# Patient Record
Sex: Male | Born: 1940 | Race: White | Hispanic: No | Marital: Married | Smoking: Former smoker
Health system: Southern US, Community
[De-identification: ages and names within clinical notes are randomized; demographics above are authoritative.]

## PROBLEM LIST (undated history)

## (undated) DIAGNOSIS — I10 Essential (primary) hypertension: Secondary | ICD-10-CM

## (undated) DIAGNOSIS — J449 Chronic obstructive pulmonary disease, unspecified: Secondary | ICD-10-CM

## (undated) DIAGNOSIS — L409 Psoriasis, unspecified: Secondary | ICD-10-CM

## (undated) DIAGNOSIS — C801 Malignant (primary) neoplasm, unspecified: Secondary | ICD-10-CM

## (undated) DIAGNOSIS — R768 Other specified abnormal immunological findings in serum: Secondary | ICD-10-CM

## (undated) DIAGNOSIS — E785 Hyperlipidemia, unspecified: Secondary | ICD-10-CM

## (undated) DIAGNOSIS — D649 Anemia, unspecified: Secondary | ICD-10-CM

## (undated) DIAGNOSIS — H269 Unspecified cataract: Secondary | ICD-10-CM

## (undated) DIAGNOSIS — Z974 Presence of external hearing-aid: Secondary | ICD-10-CM

## (undated) HISTORY — DX: Hyperlipidemia, unspecified: E78.5

## (undated) HISTORY — DX: Essential (primary) hypertension: I10

## (undated) HISTORY — DX: Unspecified cataract: H26.9

## (undated) HISTORY — DX: Malignant (primary) neoplasm, unspecified: C80.1

## (undated) HISTORY — PX: COLONOSCOPY: SHX174

## (undated) HISTORY — PX: NO PAST SURGERIES: SHX2092

## (undated) HISTORY — DX: Other specified abnormal immunological findings in serum: R76.8

## (undated) HISTORY — DX: Anemia, unspecified: D64.9

## (undated) HISTORY — DX: Psoriasis, unspecified: L40.9

---

## 2007-03-23 ENCOUNTER — Ambulatory Visit: Payer: Self-pay | Admitting: Unknown Physician Specialty

## 2007-05-11 ENCOUNTER — Encounter: Admission: RE | Admit: 2007-05-11 | Discharge: 2007-05-11 | Payer: Self-pay | Admitting: Unknown Physician Specialty

## 2007-06-04 ENCOUNTER — Ambulatory Visit: Payer: Self-pay | Admitting: Unknown Physician Specialty

## 2007-06-11 ENCOUNTER — Ambulatory Visit: Payer: Self-pay | Admitting: Unknown Physician Specialty

## 2007-11-11 ENCOUNTER — Ambulatory Visit: Payer: Self-pay | Admitting: Unknown Physician Specialty

## 2010-03-23 DIAGNOSIS — Z720 Tobacco use: Secondary | ICD-10-CM | POA: Insufficient documentation

## 2010-03-23 DIAGNOSIS — Z8249 Family history of ischemic heart disease and other diseases of the circulatory system: Secondary | ICD-10-CM | POA: Insufficient documentation

## 2010-03-23 DIAGNOSIS — E7849 Other hyperlipidemia: Secondary | ICD-10-CM | POA: Insufficient documentation

## 2010-07-04 ENCOUNTER — Ambulatory Visit: Payer: Self-pay | Admitting: Family Medicine

## 2010-07-16 ENCOUNTER — Ambulatory Visit: Payer: Self-pay | Admitting: Unknown Physician Specialty

## 2010-09-10 ENCOUNTER — Ambulatory Visit: Payer: Self-pay | Admitting: Unknown Physician Specialty

## 2011-06-06 ENCOUNTER — Ambulatory Visit: Payer: Self-pay | Admitting: Orthopedic Surgery

## 2011-08-14 DIAGNOSIS — L408 Other psoriasis: Secondary | ICD-10-CM | POA: Diagnosis not present

## 2011-08-14 DIAGNOSIS — D485 Neoplasm of uncertain behavior of skin: Secondary | ICD-10-CM | POA: Diagnosis not present

## 2011-08-14 DIAGNOSIS — L821 Other seborrheic keratosis: Secondary | ICD-10-CM | POA: Diagnosis not present

## 2011-10-28 DIAGNOSIS — I10 Essential (primary) hypertension: Secondary | ICD-10-CM | POA: Diagnosis not present

## 2011-10-28 DIAGNOSIS — J449 Chronic obstructive pulmonary disease, unspecified: Secondary | ICD-10-CM | POA: Diagnosis not present

## 2011-10-28 DIAGNOSIS — F17200 Nicotine dependence, unspecified, uncomplicated: Secondary | ICD-10-CM | POA: Diagnosis not present

## 2011-10-28 DIAGNOSIS — E7800 Pure hypercholesterolemia, unspecified: Secondary | ICD-10-CM | POA: Diagnosis not present

## 2011-11-05 DIAGNOSIS — E7800 Pure hypercholesterolemia, unspecified: Secondary | ICD-10-CM | POA: Diagnosis not present

## 2011-11-05 DIAGNOSIS — Z23 Encounter for immunization: Secondary | ICD-10-CM | POA: Diagnosis not present

## 2011-11-05 DIAGNOSIS — I10 Essential (primary) hypertension: Secondary | ICD-10-CM | POA: Diagnosis not present

## 2011-11-05 DIAGNOSIS — J449 Chronic obstructive pulmonary disease, unspecified: Secondary | ICD-10-CM | POA: Diagnosis not present

## 2011-12-17 DIAGNOSIS — D485 Neoplasm of uncertain behavior of skin: Secondary | ICD-10-CM | POA: Diagnosis not present

## 2011-12-17 DIAGNOSIS — L821 Other seborrheic keratosis: Secondary | ICD-10-CM | POA: Diagnosis not present

## 2011-12-26 DIAGNOSIS — H2510 Age-related nuclear cataract, unspecified eye: Secondary | ICD-10-CM | POA: Diagnosis not present

## 2012-02-11 DIAGNOSIS — D0430 Carcinoma in situ of skin of unspecified part of face: Secondary | ICD-10-CM | POA: Diagnosis not present

## 2012-02-11 DIAGNOSIS — L821 Other seborrheic keratosis: Secondary | ICD-10-CM | POA: Diagnosis not present

## 2012-02-11 DIAGNOSIS — D0439 Carcinoma in situ of skin of other parts of face: Secondary | ICD-10-CM | POA: Diagnosis not present

## 2012-03-05 ENCOUNTER — Ambulatory Visit: Payer: Self-pay | Admitting: Family Medicine

## 2012-03-05 DIAGNOSIS — R0902 Hypoxemia: Secondary | ICD-10-CM | POA: Diagnosis not present

## 2012-03-05 DIAGNOSIS — J449 Chronic obstructive pulmonary disease, unspecified: Secondary | ICD-10-CM | POA: Diagnosis not present

## 2012-03-05 DIAGNOSIS — E7800 Pure hypercholesterolemia, unspecified: Secondary | ICD-10-CM | POA: Diagnosis not present

## 2012-03-06 ENCOUNTER — Inpatient Hospital Stay: Payer: Self-pay | Admitting: Internal Medicine

## 2012-03-06 DIAGNOSIS — Z8249 Family history of ischemic heart disease and other diseases of the circulatory system: Secondary | ICD-10-CM | POA: Diagnosis not present

## 2012-03-06 DIAGNOSIS — E872 Acidosis, unspecified: Secondary | ICD-10-CM | POA: Diagnosis not present

## 2012-03-06 DIAGNOSIS — J9600 Acute respiratory failure, unspecified whether with hypoxia or hypercapnia: Secondary | ICD-10-CM | POA: Diagnosis not present

## 2012-03-06 DIAGNOSIS — F17200 Nicotine dependence, unspecified, uncomplicated: Secondary | ICD-10-CM | POA: Diagnosis not present

## 2012-03-06 DIAGNOSIS — Z79899 Other long term (current) drug therapy: Secondary | ICD-10-CM | POA: Diagnosis not present

## 2012-03-06 DIAGNOSIS — Z0389 Encounter for observation for other suspected diseases and conditions ruled out: Secondary | ICD-10-CM | POA: Diagnosis not present

## 2012-03-06 DIAGNOSIS — R0602 Shortness of breath: Secondary | ICD-10-CM | POA: Diagnosis not present

## 2012-03-06 DIAGNOSIS — J9620 Acute and chronic respiratory failure, unspecified whether with hypoxia or hypercapnia: Secondary | ICD-10-CM | POA: Diagnosis present

## 2012-03-06 DIAGNOSIS — J441 Chronic obstructive pulmonary disease with (acute) exacerbation: Secondary | ICD-10-CM | POA: Diagnosis not present

## 2012-03-06 DIAGNOSIS — M6282 Rhabdomyolysis: Secondary | ICD-10-CM | POA: Diagnosis present

## 2012-03-06 DIAGNOSIS — D72829 Elevated white blood cell count, unspecified: Secondary | ICD-10-CM | POA: Diagnosis present

## 2012-03-06 DIAGNOSIS — F411 Generalized anxiety disorder: Secondary | ICD-10-CM | POA: Diagnosis not present

## 2012-03-06 DIAGNOSIS — R799 Abnormal finding of blood chemistry, unspecified: Secondary | ICD-10-CM | POA: Diagnosis present

## 2012-03-06 DIAGNOSIS — R6889 Other general symptoms and signs: Secondary | ICD-10-CM | POA: Diagnosis not present

## 2012-03-06 DIAGNOSIS — J440 Chronic obstructive pulmonary disease with acute lower respiratory infection: Secondary | ICD-10-CM | POA: Diagnosis present

## 2012-03-06 DIAGNOSIS — E785 Hyperlipidemia, unspecified: Secondary | ICD-10-CM | POA: Diagnosis present

## 2012-03-06 LAB — CK TOTAL AND CKMB (NOT AT ARMC)
CK, Total: 449 U/L — ABNORMAL HIGH (ref 35–232)
CK-MB: 13.5 ng/mL — ABNORMAL HIGH (ref 0.5–3.6)

## 2012-03-06 LAB — CBC
MCV: 93 fL (ref 80–100)
Platelet: 275 10*3/uL (ref 150–440)
RBC: 5.19 10*6/uL (ref 4.40–5.90)
RDW: 14.3 % (ref 11.5–14.5)
WBC: 11.4 10*3/uL — ABNORMAL HIGH (ref 3.8–10.6)

## 2012-03-06 LAB — COMPREHENSIVE METABOLIC PANEL
Albumin: 3.8 g/dL (ref 3.4–5.0)
Alkaline Phosphatase: 104 U/L (ref 50–136)
BUN: 16 mg/dL (ref 7–18)
Calcium, Total: 9.7 mg/dL (ref 8.5–10.1)
Creatinine: 0.96 mg/dL (ref 0.60–1.30)
Glucose: 156 mg/dL — ABNORMAL HIGH (ref 65–99)
Osmolality: 280 (ref 275–301)
Sodium: 138 mmol/L (ref 136–145)
Total Protein: 8.4 g/dL — ABNORMAL HIGH (ref 6.4–8.2)

## 2012-03-06 LAB — PRO B NATRIURETIC PEPTIDE: B-Type Natriuretic Peptide: 729 pg/mL — ABNORMAL HIGH (ref 0–125)

## 2012-03-07 DIAGNOSIS — E785 Hyperlipidemia, unspecified: Secondary | ICD-10-CM | POA: Diagnosis not present

## 2012-03-07 DIAGNOSIS — Z4682 Encounter for fitting and adjustment of non-vascular catheter: Secondary | ICD-10-CM | POA: Diagnosis not present

## 2012-03-07 DIAGNOSIS — J9600 Acute respiratory failure, unspecified whether with hypoxia or hypercapnia: Secondary | ICD-10-CM | POA: Diagnosis not present

## 2012-03-07 DIAGNOSIS — R1312 Dysphagia, oropharyngeal phase: Secondary | ICD-10-CM | POA: Diagnosis not present

## 2012-03-07 DIAGNOSIS — R748 Abnormal levels of other serum enzymes: Secondary | ICD-10-CM | POA: Diagnosis present

## 2012-03-07 DIAGNOSIS — J441 Chronic obstructive pulmonary disease with (acute) exacerbation: Secondary | ICD-10-CM | POA: Diagnosis not present

## 2012-03-07 DIAGNOSIS — J9692 Respiratory failure, unspecified with hypercapnia: Secondary | ICD-10-CM | POA: Insufficient documentation

## 2012-03-07 DIAGNOSIS — R404 Transient alteration of awareness: Secondary | ICD-10-CM | POA: Diagnosis not present

## 2012-03-07 DIAGNOSIS — I959 Hypotension, unspecified: Secondary | ICD-10-CM | POA: Diagnosis present

## 2012-03-07 DIAGNOSIS — J984 Other disorders of lung: Secondary | ICD-10-CM | POA: Diagnosis not present

## 2012-03-07 DIAGNOSIS — J449 Chronic obstructive pulmonary disease, unspecified: Secondary | ICD-10-CM | POA: Diagnosis not present

## 2012-03-07 DIAGNOSIS — F17200 Nicotine dependence, unspecified, uncomplicated: Secondary | ICD-10-CM | POA: Diagnosis not present

## 2012-03-07 LAB — CBC WITH DIFFERENTIAL/PLATELET
Basophil #: 0 10*3/uL (ref 0.0–0.1)
Eosinophil #: 0 10*3/uL (ref 0.0–0.7)
HCT: 49 % (ref 40.0–52.0)
HGB: 16.5 g/dL (ref 13.0–18.0)
Lymphocyte #: 0.6 10*3/uL — ABNORMAL LOW (ref 1.0–3.6)
Lymphocyte %: 7.9 %
Monocyte #: 1.3 x10 3/mm — ABNORMAL HIGH (ref 0.2–1.0)
Monocyte %: 18 %
Neutrophil #: 5.4 10*3/uL (ref 1.4–6.5)
RBC: 5.25 10*6/uL (ref 4.40–5.90)
RDW: 14.4 % (ref 11.5–14.5)
WBC: 7.3 10*3/uL (ref 3.8–10.6)

## 2012-03-07 LAB — BASIC METABOLIC PANEL
Chloride: 96 mmol/L — ABNORMAL LOW (ref 98–107)
Co2: 34 mmol/L — ABNORMAL HIGH (ref 21–32)
Creatinine: 0.87 mg/dL (ref 0.60–1.30)
Potassium: 4.7 mmol/L (ref 3.5–5.1)

## 2012-03-19 DIAGNOSIS — Z0000 Encounter for general adult medical examination without abnormal findings: Secondary | ICD-10-CM | POA: Diagnosis not present

## 2012-03-19 DIAGNOSIS — Z23 Encounter for immunization: Secondary | ICD-10-CM | POA: Diagnosis not present

## 2012-03-19 DIAGNOSIS — J441 Chronic obstructive pulmonary disease with (acute) exacerbation: Secondary | ICD-10-CM | POA: Diagnosis not present

## 2012-03-30 DIAGNOSIS — R262 Difficulty in walking, not elsewhere classified: Secondary | ICD-10-CM | POA: Diagnosis not present

## 2012-03-30 DIAGNOSIS — J449 Chronic obstructive pulmonary disease, unspecified: Secondary | ICD-10-CM | POA: Diagnosis not present

## 2012-04-14 ENCOUNTER — Institutional Professional Consult (permissible substitution): Payer: Self-pay | Admitting: Pulmonary Disease

## 2012-04-16 DIAGNOSIS — J449 Chronic obstructive pulmonary disease, unspecified: Secondary | ICD-10-CM | POA: Diagnosis not present

## 2012-04-16 DIAGNOSIS — E7800 Pure hypercholesterolemia, unspecified: Secondary | ICD-10-CM | POA: Diagnosis not present

## 2012-04-16 DIAGNOSIS — K2100 Gastro-esophageal reflux disease with esophagitis, without bleeding: Secondary | ICD-10-CM | POA: Diagnosis not present

## 2012-04-16 DIAGNOSIS — IMO0001 Reserved for inherently not codable concepts without codable children: Secondary | ICD-10-CM | POA: Diagnosis not present

## 2012-04-16 DIAGNOSIS — M6282 Rhabdomyolysis: Secondary | ICD-10-CM | POA: Diagnosis not present

## 2012-04-16 DIAGNOSIS — D649 Anemia, unspecified: Secondary | ICD-10-CM | POA: Diagnosis not present

## 2012-07-01 DIAGNOSIS — E7800 Pure hypercholesterolemia, unspecified: Secondary | ICD-10-CM | POA: Diagnosis not present

## 2012-07-01 DIAGNOSIS — D649 Anemia, unspecified: Secondary | ICD-10-CM | POA: Diagnosis not present

## 2012-07-01 DIAGNOSIS — J449 Chronic obstructive pulmonary disease, unspecified: Secondary | ICD-10-CM | POA: Diagnosis not present

## 2012-07-01 DIAGNOSIS — IMO0001 Reserved for inherently not codable concepts without codable children: Secondary | ICD-10-CM | POA: Diagnosis not present

## 2012-07-07 ENCOUNTER — Institutional Professional Consult (permissible substitution): Payer: Self-pay | Admitting: Pulmonary Disease

## 2012-08-13 DIAGNOSIS — L570 Actinic keratosis: Secondary | ICD-10-CM | POA: Diagnosis not present

## 2012-08-13 DIAGNOSIS — Z85828 Personal history of other malignant neoplasm of skin: Secondary | ICD-10-CM | POA: Diagnosis not present

## 2012-09-01 DIAGNOSIS — J449 Chronic obstructive pulmonary disease, unspecified: Secondary | ICD-10-CM | POA: Diagnosis not present

## 2012-10-14 DIAGNOSIS — Z86010 Personal history of colonic polyps: Secondary | ICD-10-CM | POA: Diagnosis not present

## 2012-10-14 DIAGNOSIS — A048 Other specified bacterial intestinal infections: Secondary | ICD-10-CM | POA: Diagnosis not present

## 2012-10-14 DIAGNOSIS — K2940 Chronic atrophic gastritis without bleeding: Secondary | ICD-10-CM | POA: Diagnosis not present

## 2012-10-19 DIAGNOSIS — K2940 Chronic atrophic gastritis without bleeding: Secondary | ICD-10-CM | POA: Diagnosis not present

## 2012-11-19 ENCOUNTER — Ambulatory Visit: Payer: Self-pay | Admitting: Unknown Physician Specialty

## 2012-11-19 DIAGNOSIS — I10 Essential (primary) hypertension: Secondary | ICD-10-CM | POA: Diagnosis not present

## 2012-11-19 DIAGNOSIS — D126 Benign neoplasm of colon, unspecified: Secondary | ICD-10-CM | POA: Diagnosis not present

## 2012-11-19 DIAGNOSIS — Z860100 Personal history of colon polyps, unspecified: Secondary | ICD-10-CM | POA: Diagnosis not present

## 2012-11-19 DIAGNOSIS — Z09 Encounter for follow-up examination after completed treatment for conditions other than malignant neoplasm: Secondary | ICD-10-CM | POA: Diagnosis not present

## 2012-11-19 DIAGNOSIS — K279 Peptic ulcer, site unspecified, unspecified as acute or chronic, without hemorrhage or perforation: Secondary | ICD-10-CM | POA: Diagnosis not present

## 2012-11-19 DIAGNOSIS — Z79899 Other long term (current) drug therapy: Secondary | ICD-10-CM | POA: Diagnosis not present

## 2012-11-19 DIAGNOSIS — Z5309 Procedure and treatment not carried out because of other contraindication: Secondary | ICD-10-CM | POA: Diagnosis not present

## 2012-11-19 DIAGNOSIS — K5730 Diverticulosis of large intestine without perforation or abscess without bleeding: Secondary | ICD-10-CM | POA: Diagnosis not present

## 2012-11-19 DIAGNOSIS — Z888 Allergy status to other drugs, medicaments and biological substances status: Secondary | ICD-10-CM | POA: Diagnosis not present

## 2012-11-19 DIAGNOSIS — K648 Other hemorrhoids: Secondary | ICD-10-CM | POA: Diagnosis not present

## 2012-11-19 DIAGNOSIS — J449 Chronic obstructive pulmonary disease, unspecified: Secondary | ICD-10-CM | POA: Diagnosis not present

## 2012-11-19 LAB — HM COLONOSCOPY

## 2012-11-23 DIAGNOSIS — E7800 Pure hypercholesterolemia, unspecified: Secondary | ICD-10-CM | POA: Diagnosis not present

## 2012-11-23 DIAGNOSIS — I10 Essential (primary) hypertension: Secondary | ICD-10-CM | POA: Diagnosis not present

## 2012-11-23 DIAGNOSIS — K219 Gastro-esophageal reflux disease without esophagitis: Secondary | ICD-10-CM | POA: Diagnosis not present

## 2012-11-23 DIAGNOSIS — J449 Chronic obstructive pulmonary disease, unspecified: Secondary | ICD-10-CM | POA: Diagnosis not present

## 2012-11-23 LAB — PATHOLOGY REPORT

## 2013-01-18 DIAGNOSIS — J449 Chronic obstructive pulmonary disease, unspecified: Secondary | ICD-10-CM | POA: Diagnosis not present

## 2013-02-09 DIAGNOSIS — A048 Other specified bacterial intestinal infections: Secondary | ICD-10-CM | POA: Diagnosis not present

## 2013-04-13 DIAGNOSIS — L408 Other psoriasis: Secondary | ICD-10-CM | POA: Diagnosis not present

## 2013-04-13 DIAGNOSIS — L570 Actinic keratosis: Secondary | ICD-10-CM | POA: Diagnosis not present

## 2013-04-13 DIAGNOSIS — Z85828 Personal history of other malignant neoplasm of skin: Secondary | ICD-10-CM | POA: Diagnosis not present

## 2013-04-13 DIAGNOSIS — L738 Other specified follicular disorders: Secondary | ICD-10-CM | POA: Diagnosis not present

## 2013-05-13 DIAGNOSIS — L408 Other psoriasis: Secondary | ICD-10-CM | POA: Diagnosis not present

## 2013-05-14 DIAGNOSIS — Z23 Encounter for immunization: Secondary | ICD-10-CM | POA: Diagnosis not present

## 2013-07-19 DIAGNOSIS — J449 Chronic obstructive pulmonary disease, unspecified: Secondary | ICD-10-CM | POA: Diagnosis not present

## 2013-07-19 DIAGNOSIS — Z23 Encounter for immunization: Secondary | ICD-10-CM | POA: Diagnosis not present

## 2013-09-06 DIAGNOSIS — L408 Other psoriasis: Secondary | ICD-10-CM | POA: Diagnosis not present

## 2013-09-07 DIAGNOSIS — Z79899 Other long term (current) drug therapy: Secondary | ICD-10-CM | POA: Diagnosis not present

## 2013-09-07 DIAGNOSIS — L408 Other psoriasis: Secondary | ICD-10-CM | POA: Diagnosis not present

## 2013-10-15 DIAGNOSIS — L408 Other psoriasis: Secondary | ICD-10-CM | POA: Diagnosis not present

## 2013-11-11 DIAGNOSIS — J449 Chronic obstructive pulmonary disease, unspecified: Secondary | ICD-10-CM | POA: Diagnosis not present

## 2013-11-11 DIAGNOSIS — R05 Cough: Secondary | ICD-10-CM | POA: Diagnosis not present

## 2013-11-11 DIAGNOSIS — J42 Unspecified chronic bronchitis: Secondary | ICD-10-CM | POA: Diagnosis not present

## 2013-11-11 DIAGNOSIS — R059 Cough, unspecified: Secondary | ICD-10-CM | POA: Diagnosis not present

## 2013-11-11 DIAGNOSIS — K219 Gastro-esophageal reflux disease without esophagitis: Secondary | ICD-10-CM | POA: Diagnosis not present

## 2014-01-17 DIAGNOSIS — J449 Chronic obstructive pulmonary disease, unspecified: Secondary | ICD-10-CM | POA: Diagnosis not present

## 2014-04-15 DIAGNOSIS — L408 Other psoriasis: Secondary | ICD-10-CM | POA: Diagnosis not present

## 2014-04-15 DIAGNOSIS — Z79899 Other long term (current) drug therapy: Secondary | ICD-10-CM | POA: Diagnosis not present

## 2014-04-26 DIAGNOSIS — J438 Other emphysema: Secondary | ICD-10-CM | POA: Diagnosis not present

## 2014-06-02 DIAGNOSIS — Z23 Encounter for immunization: Secondary | ICD-10-CM | POA: Diagnosis not present

## 2014-07-28 DIAGNOSIS — C44212 Basal cell carcinoma of skin of right ear and external auricular canal: Secondary | ICD-10-CM | POA: Diagnosis not present

## 2014-07-28 DIAGNOSIS — D485 Neoplasm of uncertain behavior of skin: Secondary | ICD-10-CM | POA: Diagnosis not present

## 2014-10-14 DIAGNOSIS — X32XXXA Exposure to sunlight, initial encounter: Secondary | ICD-10-CM | POA: Diagnosis not present

## 2014-10-14 DIAGNOSIS — Z85828 Personal history of other malignant neoplasm of skin: Secondary | ICD-10-CM | POA: Diagnosis not present

## 2014-10-14 DIAGNOSIS — L400 Psoriasis vulgaris: Secondary | ICD-10-CM | POA: Diagnosis not present

## 2014-10-14 DIAGNOSIS — L409 Psoriasis, unspecified: Secondary | ICD-10-CM | POA: Diagnosis not present

## 2014-10-14 DIAGNOSIS — L570 Actinic keratosis: Secondary | ICD-10-CM | POA: Diagnosis not present

## 2014-10-14 DIAGNOSIS — Z5181 Encounter for therapeutic drug level monitoring: Secondary | ICD-10-CM | POA: Diagnosis not present

## 2014-10-24 DIAGNOSIS — C44211 Basal cell carcinoma of skin of unspecified ear and external auricular canal: Secondary | ICD-10-CM | POA: Insufficient documentation

## 2014-10-24 DIAGNOSIS — L814 Other melanin hyperpigmentation: Secondary | ICD-10-CM | POA: Diagnosis not present

## 2014-10-24 DIAGNOSIS — C44212 Basal cell carcinoma of skin of right ear and external auricular canal: Secondary | ICD-10-CM | POA: Diagnosis not present

## 2014-10-24 DIAGNOSIS — L908 Other atrophic disorders of skin: Secondary | ICD-10-CM | POA: Diagnosis not present

## 2014-10-24 DIAGNOSIS — L578 Other skin changes due to chronic exposure to nonionizing radiation: Secondary | ICD-10-CM | POA: Diagnosis not present

## 2014-10-25 DIAGNOSIS — J432 Centrilobular emphysema: Secondary | ICD-10-CM | POA: Diagnosis not present

## 2014-11-09 DIAGNOSIS — M6282 Rhabdomyolysis: Secondary | ICD-10-CM | POA: Diagnosis not present

## 2014-11-09 DIAGNOSIS — E780 Pure hypercholesterolemia: Secondary | ICD-10-CM | POA: Diagnosis not present

## 2014-11-09 DIAGNOSIS — K219 Gastro-esophageal reflux disease without esophagitis: Secondary | ICD-10-CM | POA: Diagnosis not present

## 2014-11-09 DIAGNOSIS — D649 Anemia, unspecified: Secondary | ICD-10-CM | POA: Diagnosis not present

## 2014-11-09 DIAGNOSIS — J449 Chronic obstructive pulmonary disease, unspecified: Secondary | ICD-10-CM | POA: Diagnosis not present

## 2014-11-09 DIAGNOSIS — Z0000 Encounter for general adult medical examination without abnormal findings: Secondary | ICD-10-CM | POA: Diagnosis not present

## 2014-11-09 DIAGNOSIS — I10 Essential (primary) hypertension: Secondary | ICD-10-CM | POA: Diagnosis not present

## 2014-11-09 DIAGNOSIS — R5381 Other malaise: Secondary | ICD-10-CM | POA: Diagnosis not present

## 2014-11-09 LAB — HEPATIC FUNCTION PANEL
ALK PHOS: 70 U/L (ref 25–125)
ALT: 11 U/L (ref 10–40)
AST: 19 U/L (ref 14–40)

## 2014-11-09 LAB — BASIC METABOLIC PANEL
BUN: 19 mg/dL (ref 4–21)
Creatinine: 1.2 mg/dL (ref 0.6–1.3)
GLUCOSE: 113 mg/dL
POTASSIUM: 4.5 mmol/L (ref 3.4–5.3)
Sodium: 138 mmol/L (ref 137–147)

## 2014-11-09 LAB — LIPID PANEL
CHOLESTEROL: 173 mg/dL (ref 0–200)
HDL: 52 mg/dL (ref 35–70)
LDL CALC: 98 mg/dL
LDl/HDL Ratio: 1.9
Triglycerides: 114 mg/dL (ref 40–160)

## 2014-11-09 LAB — TSH: TSH: 0.98 u[IU]/mL (ref 0.41–5.90)

## 2014-11-09 LAB — CBC AND DIFFERENTIAL
HEMATOCRIT: 45 % (ref 41–53)
Hemoglobin: 15.5 g/dL (ref 13.5–17.5)
Neutrophils Absolute: 7 /uL
Platelets: 275 10*3/uL (ref 150–399)
WBC: 9.6 10*3/mL

## 2014-11-10 DIAGNOSIS — Z23 Encounter for immunization: Secondary | ICD-10-CM | POA: Diagnosis not present

## 2014-11-29 NOTE — H&P (Signed)
PATIENT NAME:  Gregory Haynes, Gregory Haynes MR#:  [id number] DATE OF BIRTH:  [birth year]  DATE OF ADMISSION:  03/06/2012  PRIMARY CARE PHYSICIAN:  Miguel Aschoff, MD  CHIEF COMPLAINT: Shortness of breath and wheezing.   HISTORY OF PRESENT ILLNESS: This is a 74 year old male patient with history of chronic obstructive pulmonary disease and ongoing tobacco abuse who presents to the Emergency Room complaining of worsening shortness of breath and wheezing over two days. He saw his primary care physician yesterday who started him on some medications he does not remember and also inhalers. This did not improve his symptoms much and the patient presented to the Emergency Room today. He was initially saturating 82%, acutely short of breath, and was given steroids, magnesium, and multiple nebulizer treatments and still is short of breath needing oxygen support to keep his saturations over 87%, and is being admitted to the hospitalist service with acute chronic obstructive pulmonary disease exacerbation. His chest x-ray shows no acute pneumonia.   The patient presently feels better, but is still in significant respiratory distress using his accessory muscles. He does not have any cough or chest pain. He does feel tight when he tries to breathe. No PND or orthopnea. No abdominal pain, diarrhea, fever, chills, or rash.   PAST MEDICAL HISTORY:  1. Chronic obstructive pulmonary disease. 2. Tobacco abuse.   FAMILY HISTORY: His mother died at age of 25 and father died at age of 52 with a heart attack. He has three brothers who are healthy, although they do have heart problems.   SOCIAL HISTORY: The patient lives with his wife, continues to smoke 4 to 5 cigarettes a day. He used to smoke 2 packs in the past. No alcohol. No illicit drugs. Ambulates on his own. Does not use any home oxygen. Code status is DNR/DNI.   ALLERGIES: No known drug allergies.     HOME MEDICATIONS:  1. Dulera inhaler 2 puffs twice a day.   2. Prednisone 10 mg oral once a day, tapering dose, started yesterday.  3. ProAir HFA 1 to 2 puffs inhalation every three hours as needed.  4. Simvastatin 40 mg oral once a day.  5. Spiriva 18 mcg inhaled once a day.   REVIEW OF SYSTEMS: Constitutional, eyes, ENT, respiratory, cardiovascular, gastrointestinal, genitourinary, endocrine, hematology, lymphatic, skin, musculoskeletal, neurology, and psychiatry reviewed and negative except what is mentioned in the History of Present Illness.   PHYSICAL EXAMINATION:   VITALS: Temperature 98.3, pulse 106, respirations 24, blood pressure 126/89, and saturating 98% on 3 liters oxygen.   GENERAL: Moderately built Caucasian male patient lying in bed in respiratory distress.   PSYCH: Alert and oriented x3. Mood and affect appropriate. Judgment intact. Pleasant.   HEENT: Atraumatic, normocephalic. Oral mucosa moist and pink. External ears and nose normal. No pallor. No icterus. Pupils bilaterally equal and reactive to light.   NECK: Supple. No thyromegaly. No palpable lymph nodes. Trachea midline. No carotid bruit or JVD.   CARDIOVASCULAR: S1 and S2 tachycardic, regular, no murmurs. Peripheral pulses 2+. No edema.   RESPIRATORY: Decreased air entry on both sides with expiratory wheezes. Normal to percussion. Not using accessory muscles.   GASTROINTESTINAL: Soft abdomen, nontender. Bowel sounds present. No hepatosplenomegaly palpable.   GENITOURINARY: No CVA tenderness or bladder distention.  SKIN: No petechiae. Has diffuse rash in upper extremities which looks like psoriatic rash. No ulcers.   MUSCULOSKELETAL: No joint swelling, redness, or effusion of the large joints. Normal muscle tone.   NEUROLOGICAL: Motor strength 5/5  in upper and lower extremities. Sensation to fine touch intact all over. Cranial nerves II through XII are intact.   LABORATORY, DIAGNOSTIC AND RADIOLOGIC DATA: BNP 729. BUN 16, creatinine 0.96 with sodium and potassium  normal, and AST, ALT, alkaline phosphatase, and bilirubin normal. CK 449, CK-MB 13.5, and troponin less than 0.02 WBC 11.4, hemoglobin 16.3, and platelets of 275.   ABG shows a pH of 7.36, pCO2 57, and pO2 49 on room air.   Chest x-ray shows chronic obstructive pulmonary. No pneumonia or pulmonary edema.   ASSESSMENT AND PLAN:  1. Acute chronic obstructive pulmonary disease exacerbation. This is secondary to the patient's continued tobacco abuse. We will start him on IV steroids and scheduled nebulizer treatments along with oxygen support. We will also start him on oral azithromycin. We will continue his home Spiriva and Dulera inhalers.  2. Acute respiratory failure secondary to chronic obstructive pulmonary disease exacerbation.  3. Mild rhabdomyolysis with elevated CK likely secondary from his acute respiratory distress. This needs to be monitored.  4. Leukocytosis secondary to outpatient steroids the patient was started on.  5. Tobacco abuse. The patient was counseled for at least four minutes regarding quitting smoking. Nicotine patch offered. Outpatient options explained.   CODE STATUS: DNR/DNI.          TIME SPENT: Time spent today on this case was 50 minutes with more than 50% time spent in coordination of care. ____________________________ Leia Alf. Billi Bright, MD srs:slb D: 03/06/2012 16:39:17 ET T: 03/06/2012 17:18:30 ET JOB#: [id number]  cc: Alveta Heimlich R. Tagen Milby, MD, <Dictator> Richard L. Rosanna Randy, MD Neita Carp MD ELECTRONICALLY SIGNED 03/11/2012 13:48

## 2014-11-29 NOTE — Consult Note (Signed)
PATIENT NAME:  Gregory Haynes, Gregory Haynes MR#:  [id number] DATE OF BIRTH:  [birth year]  DATE OF CONSULTATION:  03/07/2012  REFERRING PHYSICIAN:   CONSULTING PHYSICIAN:  Allyne Gee, MD  REASON FOR CONSULTATION: Acute respiratory failure.  HISTORY OF PRESENT ILLNESS: The patient is a 74 year old gentleman who has history of COPD. He had been having increasing shortness of breath, cough, and wheezing. He was seen by Dr. Rosanna Randy and the patient was admitted to the hospital. The patient had been taking some steroids as an outpatient and also been using nebulizers but he was noted to be significantly hypoxic when he was initially evaluated and because of that he was sent to the hospital. He is currently not on home oxygen apparently. On evaluation he had a chest x-ray done which did not show any acute pneumonia but there were increasing interstitial markings when compared to previous films.   PAST MEDICAL HISTORY: 1. Chronic obstructive pulmonary disease. 2. Ongoing tobacco use.  FAMILY HISTORY: Positive for cardiac disease.  SOCIAL HISTORY: Positive for smoking, smokes up to 2 packs per day.  ALLERGIES: Negative.  MEDICATIONS: He has been on: 1. ProAir. 2. Ruthe Mannan. 3. Spiriva.  REVIEW OF SYSTEMS: He is somewhat somnolent. He is not able to provide at this time.   PHYSICAL EXAMINATION:  VITAL SIGNS: At the time he was seen, temperature was 97.8, pulse 111, respiratory rate 24, blood pressure 178/86, saturations were 94%.   NECK: Supple. There is no JVD or adenopathy.  HEENT: Extraocular movements could not be assessed. He had BiPAP mask on his face.   ABDOMEN: Soft and nontender.  EXTREMITIES: No edema or cyanosis.   NEUROLOGIC: Somnolent, could not assess.   SKIN: Without any acute rashes.   MUSCULOSKELETAL: Without any active synovitis.  LABORATORY, DIAGNOSTIC, AND RADIOLOGICAL DATA: BUN 16, creatinine 0.87, sodium 138, potassium 4.7. The patient did have an ABG done yesterday showing  7.36/57/49. The patient's white count was 7.3, hemoglobin 16.5.  IMPRESSION: Acute on chronic respiratory failure. He probably has hypercapnia at baseline and he's had significant decompensation. Currently he should have a CT scan of his chest. I agree with that. The patient also is on azithromycin. Would switch over to IV azithromycin. He has also been getting Rocephin which should be continued. He also needs to be on methylprednisolone which you have already started him on. I would also consider theophylline if he can swallow, which he currently cannot. Will talk to Pharmacy about possibly starting him on IV theophylline. The patient's family right now says that they want to keep him as a FULL CODE and possibly short-term ventilation. Will continue to monitor and follow. Further recommendations as deemed necessary.    ____________________________ Allyne Gee, MD sak:drc D: 03/07/2012 13:42:06 ET T: 03/07/2012 14:02:39 ET JOB#: [id number]  cc: Allyne Gee, MD, <Dictator> Allyne Gee MD ELECTRONICALLY SIGNED 03/18/2012 11:45

## 2014-11-29 NOTE — Discharge Summary (Signed)
PATIENT NAME:  Gregory Haynes, Gregory Haynes MR#:  [id number] DATE OF BIRTH:  22-Jul-1941  DATE OF ADMISSION:  03/06/2012 DATE OF DISCHARGE:  03/07/2012  ADDENDUM:   A CT scan of the chest showed no evidence of acute pulmonary embolism nor acute thoracic aortic pathology, bullous emphysema, interstitial density both lungs, interstitial edema of cardiac or noncardiac cause. No overt evidence of pulmonary vascular congestion. Soft tissue density within the trachea likely represents mucus. Repeat ABG on BiPAP showed a pH of 7.16, pCO2 115, pO2 60, bicarbonate 41, oxygen saturation 82% on BiPAP. The patient was intubated despite BiPAP treatment with worsening acidosis and CO2 retention. Chest x-ray showed ET tube is in place.   Initial vent settings with hyperventilation as per Respiratory. The patient will be transferred to [facility]. Recommend getting a repeat ABG upon arrival to [facility]. A normal saline bolus was given since blood pressure did drop down with the intubation. Propofol drip ordered.   TIME SPENT:   Time spent on patient care today just under two hours.    ____________________________ Tana Conch. Leslye Peer, MD rjw:cbb D: 03/07/2012 16:03:05 ET T: 03/07/2012 16:33:30 ET JOB#: [id number]  cc: Tana Conch. Leslye Peer, MD, <Dictator> Marisue Brooklyn MD ELECTRONICALLY SIGNED 03/09/2012 17:20

## 2014-11-29 NOTE — Discharge Summary (Signed)
PATIENT NAME:  Gregory Haynes, Gregory Haynes MR#:  [id number] DATE OF BIRTH:  Dec 16, 1940  DATE OF ADMISSION:  03/06/2012 DATE OF DISCHARGE:  03/07/2012   TYPE OF DISCHARGE: Transfer to [facility]   FINAL DIAGNOSES:  1. Acute respiratory failure.  2. Chronic obstructive pulmonary disease exacerbation.  3. Elevated BNP.  4. Hyperlipidemia.  5. Tobacco abuse.   CURRENT MEDICATIONS:  1. Tylenol 650 mg orally q.4 hours p.r.n. pain or temperature.  2. Solu-Medrol 60 mg IV q.6 hours.  3. Zofran 4 mg IV push q.4 hours p.r.n.  4. Protonix 40 mg daily.  5. Ambien 5 mg at bedtime p.r.n. insomnia.  6. Zithromax 250 mg IV q.24 hours.  7. Symbicort 2 puffs twice a day. 8. Ceftriaxone 1 gram IV piggyback q.24 hours.  9. Lovenox 80 mg/kg sub-Q q.12 hours.  10. Lasix 40 mg IV push daily.  11. Spiriva 1 inhalation daily.  12. Theophylline 100 mg b.i.d.   REASON FOR ADMISSION: The patient came in with shortness of breath and wheezing.   HISTORY OF PRESENT ILLNESS: The patient is a 74 year old man with COPD and ongoing tobacco abuse who went to the Emergency Room for shortness of breath and wheezing over the past couple of days. He saw his primary care physician the day prior and started on some medication. Pulse oximetry 87% on room air. The patient was given IV Solu-Medrol, high dose, and Zithromax IV.   LABORATORY AND RADIOLOGICAL DATA DURING THE HOSPITAL COURSE: Chest x-ray showed no evidence of pneumonia or CHF. Findings consistent with COPD.   Troponin negative. Glucose 156, BUN 16, creatinine 0.96, sodium 138, potassium 4.8, chloride 100, CO2 33, calcium 9.7. Liver function tests AST slightly elevated at 5.4, total protein slightly elevated at 8.4. White blood cell count 11.4, hemoglobin and hematocrit 16.3 and 48.4, platelet count 275. CPK 449. CK-MB 13.5. BNP 729.  Repeat chest x-ray showed findings consistent with COPD, likely superimposed acute bronchitis and/or early interstitial pneumonia.   ABG showed pH  of 7.36, pCO2 of 57, pO2 of 49, bicarb 32.2, oxygen saturation 82.5. On the 27th CBC showed a white count of 7.3, hemoglobin and hematocrit 16.5 and 49.0, platelet count 278, glucose 150, BUN 16, creatinine 0.87, sodium 138, potassium 4.7, chloride 96, CO2 34, calcium 9.5.   HOSPITAL COURSE: The patient was seen by me on the 27th initially moving poor air entry. His pulse oximetry with ambulation dropped down to 67%. He was currently on 3 liters. He was continued on high dose Solu-Medrol. Spiriva and Symbicort was added. Family requested a Pulmonary consult. I added Rocephin to the Zithromax. I had to go back in the room with the patient's family coming running out that the patient was blue. Pulse oximetry at that time was 60%. We put oxygen on him and had him take a few deep breaths. It did come up to 90% and we decided to either use high flow nasal cannula or BiPAP. Respiratory was able to get BiPAP. I did order a CT scan of the chest to look at the lung parenchyma and rule out pulmonary embolism. Currently the patient is down for testing. The patient will be watched closely in the ICU. The patient is a FULL CODE. Dr. Humphrey Rolls from Pulmonary did start theophylline. [facility] called back and they said they had a bed and family wanted transfer so he will be transferred out to [facility]. Further management as per [facility].   TIME SPENT ON PATIENT CARE TODAY: 70 minutes.   ____________________________  Alem Fahl J. Leslye Peer, MD rjw:drc D: 03/07/2012 14:11:24 ET T: 03/07/2012 14:19:58 ET JOB#: [id number]  cc: Tana Conch. Leslye Peer, MD, <Dictator> Marisue Brooklyn MD ELECTRONICALLY SIGNED 03/09/2012 17:19

## 2014-12-12 DIAGNOSIS — H903 Sensorineural hearing loss, bilateral: Secondary | ICD-10-CM | POA: Diagnosis not present

## 2014-12-12 DIAGNOSIS — H6122 Impacted cerumen, left ear: Secondary | ICD-10-CM | POA: Diagnosis not present

## 2015-01-21 ENCOUNTER — Other Ambulatory Visit: Payer: Self-pay | Admitting: Family Medicine

## 2015-04-07 ENCOUNTER — Other Ambulatory Visit: Payer: Self-pay | Admitting: Family Medicine

## 2015-04-07 NOTE — Telephone Encounter (Signed)
Med sent to pharmacy.

## 2015-05-02 DIAGNOSIS — Z129 Encounter for screening for malignant neoplasm, site unspecified: Secondary | ICD-10-CM | POA: Diagnosis not present

## 2015-05-02 DIAGNOSIS — Z122 Encounter for screening for malignant neoplasm of respiratory organs: Secondary | ICD-10-CM | POA: Diagnosis not present

## 2015-05-02 DIAGNOSIS — J432 Centrilobular emphysema: Secondary | ICD-10-CM | POA: Diagnosis not present

## 2015-05-09 DIAGNOSIS — R911 Solitary pulmonary nodule: Secondary | ICD-10-CM | POA: Diagnosis not present

## 2015-05-09 DIAGNOSIS — Z87891 Personal history of nicotine dependence: Secondary | ICD-10-CM | POA: Diagnosis not present

## 2015-05-09 DIAGNOSIS — Z122 Encounter for screening for malignant neoplasm of respiratory organs: Secondary | ICD-10-CM | POA: Diagnosis not present

## 2015-05-09 DIAGNOSIS — I2510 Atherosclerotic heart disease of native coronary artery without angina pectoris: Secondary | ICD-10-CM | POA: Diagnosis not present

## 2015-05-09 DIAGNOSIS — J432 Centrilobular emphysema: Secondary | ICD-10-CM | POA: Diagnosis not present

## 2015-05-10 DIAGNOSIS — A048 Other specified bacterial intestinal infections: Secondary | ICD-10-CM | POA: Insufficient documentation

## 2015-05-10 DIAGNOSIS — L409 Psoriasis, unspecified: Secondary | ICD-10-CM | POA: Insufficient documentation

## 2015-05-10 DIAGNOSIS — I10 Essential (primary) hypertension: Secondary | ICD-10-CM | POA: Insufficient documentation

## 2015-05-10 DIAGNOSIS — D649 Anemia, unspecified: Secondary | ICD-10-CM | POA: Insufficient documentation

## 2015-05-10 DIAGNOSIS — K2100 Gastro-esophageal reflux disease with esophagitis, without bleeding: Secondary | ICD-10-CM | POA: Insufficient documentation

## 2015-05-10 DIAGNOSIS — M6282 Rhabdomyolysis: Secondary | ICD-10-CM | POA: Insufficient documentation

## 2015-05-10 DIAGNOSIS — J449 Chronic obstructive pulmonary disease, unspecified: Secondary | ICD-10-CM | POA: Insufficient documentation

## 2015-05-11 ENCOUNTER — Ambulatory Visit (INDEPENDENT_AMBULATORY_CARE_PROVIDER_SITE_OTHER): Payer: Medicare Other | Admitting: Family Medicine

## 2015-05-11 ENCOUNTER — Encounter: Payer: Self-pay | Admitting: Family Medicine

## 2015-05-11 VITALS — BP 112/68 | HR 76 | Temp 98.6°F | Resp 14 | Wt 199.0 lb

## 2015-05-11 DIAGNOSIS — J449 Chronic obstructive pulmonary disease, unspecified: Secondary | ICD-10-CM | POA: Diagnosis not present

## 2015-05-11 NOTE — Progress Notes (Signed)
Patient ID: Gregory Haynes, male   DOB: [birth year], 74 y.o.   MRN: [id number]    Subjective:  HPI  Hypertension follow up: Patient is here for 6 months follow up. Patient has his B/P checked at times but is not sure of the readings. Not having cardiac issues or symptoms of concern.  BP Readings from Last 3 Encounters:  05/11/15 112/68  11/09/14 110/72   GERD follow up: Patient is not having any symptoms. He is taking Protonix daily.  COPD follow up: Patient sees pulmonologist at [facility]. Had CT scan of the lungs- he is not having issues that doctor told patient that Medicare was covering this test for free. Results were stable. Prior to Admission medications   Medication Sig Start Date End Date Taking? Authorizing Provider  ADALIMUMAB Inject into the skin.    Historical Provider, MD  albuterol (PROAIR HFA) 108 (90 BASE) MCG/ACT inhaler Inhale into the lungs. 11/17/14   Historical Provider, MD  aspirin 81 MG tablet Take by mouth. 04/16/12   Historical Provider, MD  atorvastatin (LIPITOR) 40 MG tablet TAKE 1 TABLET EVERY DAY USUALLY IN THE EVENING 04/07/15   Jerrol Banana., MD  budesonide-formoterol [facility]) 80-4.5 MCG/ACT inhaler Inhale into the lungs. 06/20/14   Historical Provider, MD  carvedilol (COREG) 12.5 MG tablet Take by mouth. 12/07/14   Historical Provider, MD  hydrochlorothiazide (HYDRODIURIL) 25 MG tablet Take by mouth. 12/22/14   Historical Provider, MD  pantoprazole (PROTONIX) 40 MG tablet Take by mouth. 04/26/14   Historical Provider, MD  SPIRIVA HANDIHALER 18 MCG inhalation capsule TAKE CONTENTS OF ONE CAPSULE VIA HANDIHALER ONCE DAILY 01/23/15   Jerrol Banana., MD    Patient Active Problem List   Diagnosis Date Noted  . 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 COPD, moderate 05/10/2015  . Esophagitis, reflux 05/10/2015  . BP (high blood pressure) 05/10/2015  . Positive H. pylori test 05/10/2015  . 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 Psoriasis 05/10/2015  . 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 Respiratory failure with hypercapnia 03/07/2012  . Fam hx-ischem heart disease 03/23/2010  . Hypercholesteremia 03/23/2010  . Current tobacco use 03/23/2010    No past medical history on file.  Social History   Social History  . Marital Status: Married    Spouse Name: N/A  . Number of Children: N/A  . Years of Education: N/A   Occupational History  . Not on file.   Social History Main Topics  . Smoking status: Former Smoker -- 1.50 packs/day for 55 years    Types: Cigarettes    Quit date: 08/13/2011  . Smokeless tobacco: Never Used  . Alcohol Use: Yes     Comment: social  . Drug Use: No  . Sexual Activity: Not on file   Other Topics Concern  . Not on file   Social History Narrative    No Known Allergies  Review of Systems  Constitutional: Negative.   Eyes: Negative.   Respiratory: Negative.   Cardiovascular: Negative.   Gastrointestinal: Negative.   Musculoskeletal: Negative.   Skin: Negative.   Neurological: Negative.   Psychiatric/Behavioral: Negative.     Immunization History  Administered Date(s) Administered  . Pneumococcal Conjugate-13 11/10/2014  . Tdap 11/05/2011  . Zoster 11/05/2011   Objective:  BP 112/68 mmHg  Pulse 76  Temp(Src) 98.6 F (37 C)  Resp 14  Wt 199 lb (90.266 kg)  Physical Exam  Constitutional: He is oriented to person, place, and time and well-developed, well-nourished, and in no distress.  HENT:  Head: Normocephalic and atraumatic.  Right Ear: External ear normal.  Left Ear: External ear normal.  Nose: Nose normal.  Eyes: Conjunctivae are normal.  Neck: Neck supple.  Cardiovascular: Normal rate, regular rhythm and normal heart sounds.   Pulmonary/Chest: Effort normal and breath sounds normal.  Abdominal: Soft.  Neurological: He is alert and oriented to person, place, and time.  Skin: Skin is warm and  dry.  Psychiatric: Mood, memory, affect and judgment normal.    Lab Results  Component Value Date   WBC 9.6 11/09/2014   HGB 15.5 11/09/2014   HCT 45 11/09/2014   PLT 275 11/09/2014   GLUCOSE 150* 03/07/2012   CHOL 173 11/09/2014   TRIG 114 11/09/2014   HDL 52 11/09/2014   LDLCALC 98 11/09/2014   TSH 0.98 11/09/2014    CMP     Component Value Date/Time   NA 138 11/09/2014   NA 138 03/07/2012 0646   K 4.5 11/09/2014   K 4.7 03/07/2012 0646   CL 96* 03/07/2012 0646   CO2 34* 03/07/2012 0646   GLUCOSE 150* 03/07/2012 0646   BUN 19 11/09/2014   BUN 16 03/07/2012 0646   CREATININE 1.2 11/09/2014   CREATININE 0.87 03/07/2012 0646   CALCIUM 9.5 03/07/2012 0646   PROT 8.4* 03/06/2012 1353   ALBUMIN 3.8 03/06/2012 1353   AST 19 11/09/2014   AST 50* 03/06/2012 1353   ALT 11 11/09/2014   ALT 26 03/06/2012 1353   ALKPHOS 70 11/09/2014   ALKPHOS 104 03/06/2012 1353   BILITOT 0.5 03/06/2012 1353   GFRNONAA >60 03/07/2012 0646   GFRAA >60 03/07/2012 0646    Assessment and Plan :  COPD, severe Followed at [facility]. He is doing well. I have recommended he get the flu shot today. He is going to do that. He has had Prevnar and Pneumovax. Health maintenance/AWE in the spring   Miguel Aschoff MD [employer] 05/11/2015 8:29 AM

## 2015-05-16 DIAGNOSIS — Z85828 Personal history of other malignant neoplasm of skin: Secondary | ICD-10-CM | POA: Diagnosis not present

## 2015-05-16 DIAGNOSIS — L820 Inflamed seborrheic keratosis: Secondary | ICD-10-CM | POA: Diagnosis not present

## 2015-05-16 DIAGNOSIS — D485 Neoplasm of uncertain behavior of skin: Secondary | ICD-10-CM | POA: Diagnosis not present

## 2015-05-16 DIAGNOSIS — L400 Psoriasis vulgaris: Secondary | ICD-10-CM | POA: Diagnosis not present

## 2015-05-22 ENCOUNTER — Other Ambulatory Visit: Payer: Self-pay | Admitting: Family Medicine

## 2015-06-01 DIAGNOSIS — Z5181 Encounter for therapeutic drug level monitoring: Secondary | ICD-10-CM | POA: Diagnosis not present

## 2015-06-01 DIAGNOSIS — L400 Psoriasis vulgaris: Secondary | ICD-10-CM | POA: Diagnosis not present

## 2015-06-05 DIAGNOSIS — Z23 Encounter for immunization: Secondary | ICD-10-CM | POA: Diagnosis not present

## 2015-06-07 ENCOUNTER — Other Ambulatory Visit: Payer: Self-pay | Admitting: Family Medicine

## 2015-06-13 DIAGNOSIS — L400 Psoriasis vulgaris: Secondary | ICD-10-CM | POA: Diagnosis not present

## 2015-06-13 DIAGNOSIS — Z5181 Encounter for therapeutic drug level monitoring: Secondary | ICD-10-CM | POA: Diagnosis not present

## 2015-06-14 DIAGNOSIS — H903 Sensorineural hearing loss, bilateral: Secondary | ICD-10-CM | POA: Diagnosis not present

## 2015-07-26 DIAGNOSIS — L400 Psoriasis vulgaris: Secondary | ICD-10-CM | POA: Diagnosis not present

## 2015-08-03 DIAGNOSIS — Z5181 Encounter for therapeutic drug level monitoring: Secondary | ICD-10-CM | POA: Diagnosis not present

## 2015-08-03 DIAGNOSIS — L400 Psoriasis vulgaris: Secondary | ICD-10-CM | POA: Diagnosis not present

## 2015-09-26 ENCOUNTER — Ambulatory Visit (INDEPENDENT_AMBULATORY_CARE_PROVIDER_SITE_OTHER): Payer: Medicare Other | Admitting: Family Medicine

## 2015-09-26 ENCOUNTER — Encounter: Payer: Self-pay | Admitting: Family Medicine

## 2015-09-26 VITALS — BP 138/80 | HR 74 | Temp 97.7°F | Resp 16 | Wt 200.0 lb

## 2015-09-26 DIAGNOSIS — F329 Major depressive disorder, single episode, unspecified: Secondary | ICD-10-CM | POA: Diagnosis not present

## 2015-09-26 DIAGNOSIS — F32A Depression, unspecified: Secondary | ICD-10-CM

## 2015-09-26 DIAGNOSIS — F419 Anxiety disorder, unspecified: Secondary | ICD-10-CM

## 2015-09-26 MED ORDER — MIRTAZAPINE 30 MG PO TABS: 30.0000 mg | ORAL_TABLET | Freq: Every day | ORAL | Status: DC

## 2015-09-26 NOTE — Progress Notes (Signed)
Patient ID: Gregory Haynes, male   DOB: [birth year], 75 y.o.   MRN: [id number]    Subjective:  HPI Pt and wife are here today because they think pt is depressed and has anxiety. They have had a situation come up that has turned their world upside down and made them practically have to "start over". Pt may have to go back to work to make living. Pt feels like he has left his family and himself down. When asked this he and wife became tearful. He is having trouble sleeping because this is all that he can think about. He wakes up at 2 am and this is on his mind and he can not go back to sleep. Wife thinks if he can get some sleep, the anxiety and depression would be better.  Depression screen [facility] 2/9 09/26/2015 05/11/2015  Decreased Interest 0 0  Down, Depressed, Hopeless 2 0  PHQ - 2 Score 2 0  Altered sleeping 3 -  Tired, decreased energy 2 -  Change in appetite 0 -  Feeling bad or failure about yourself  3 -  Trouble concentrating 0 -  Moving slowly or fidgety/restless 0 -  Suicidal thoughts 0 -  PHQ-9 Score 10 -  Difficult doing work/chores Not difficult at all -      Prior to Admission medications   Medication Sig Start Date End Date Taking? Authorizing Provider  ADALIMUMAB Inject into the skin.   Yes Historical Provider, MD  albuterol (PROAIR HFA) 108 (90 BASE) MCG/ACT inhaler Inhale into the lungs. 11/17/14  Yes Historical Provider, MD  aspirin 81 MG tablet Take by mouth. 04/16/12  Yes Historical Provider, MD  atorvastatin (LIPITOR) 40 MG tablet TAKE 1 TABLET EVERY DAY USUALLY IN THE EVENING 04/07/15  Yes Richard Maceo Pro., MD  carvedilol (COREG) 12.5 MG tablet TAKE ONE TABLET EVERY TWELVE HOURS 06/08/15  Yes Jerrol Banana., MD  hydrochlorothiazide (HYDRODIURIL) 25 MG tablet Take by mouth. 12/22/14  Yes Historical Provider, MD  methotrexate (RHEUMATREX) 2.5 MG tablet  09/22/15  Yes Historical Provider, MD  pantoprazole (PROTONIX) 40 MG tablet TAKE 1 TABLET EVERY DAY 05/22/15  Yes  Richard Maceo Pro., MD  SPIRIVA HANDIHALER 18 MCG inhalation capsule TAKE CONTENTS OF ONE CAPSULE VIA HANDIHALER ONCE DAILY 01/23/15  Yes Jerrol Banana., MD  SYMBICORT 160-4.5 MCG/ACT inhaler  09/22/15  Yes Historical Provider, MD    Patient Active Problem List   Diagnosis Date Noted  . 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 CAFL (chronic airflow limitation) ([place]) 05/10/2015  . COPD, moderate ([place]) 05/10/2015  . Esophagitis, reflux 05/10/2015  . BP (high blood pressure) 05/10/2015  . Positive H. pylori test 05/10/2015  . 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 Psoriasis 05/10/2015  . 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 Respiratory failure with hypercapnia ([place]) 03/07/2012  . Fam hx-ischem heart disease 03/23/2010  . Hypercholesteremia 03/23/2010  . Current tobacco use 03/23/2010    History reviewed. No pertinent past medical history.  Social History   Social History  . Marital Status: Married    Spouse Name: N/A  . Number of Children: N/A  . Years of Education: N/A   Occupational History  . Not on file.   Social History Main Topics  . Smoking status: Former Smoker -- 1.50 packs/day for 55 years    Types: Cigarettes    Quit date: 08/13/2011  . Smokeless tobacco: Never Used  . Alcohol Use: Yes  Comment: social  . Drug Use: No  . Sexual Activity: Not on file   Other Topics Concern  . Not on file   Social History Narrative    No Known Allergies  Review of Systems  Constitutional: Positive for malaise/fatigue.  HENT: Negative.   Eyes: Negative.   Respiratory: Positive for shortness of breath.   Cardiovascular: Negative.   Gastrointestinal: Negative.   Genitourinary: Negative.   Musculoskeletal: Negative.   Skin: Negative.   Neurological: Negative.   Endo/Heme/Allergies: Negative.   Psychiatric/Behavioral: Positive for depression. The patient is nervous/anxious.     Immunization History  Administered Date(s)  Administered  . Pneumococcal Conjugate-13 11/10/2014  . Tdap 11/05/2011  . Zoster 11/05/2011   Objective:  BP 138/80 mmHg  Pulse 74  Temp(Src) 97.7 F (36.5 C) (Oral)  Resp 16  Wt 200 lb (90.719 kg)  SpO2 94%  Physical Exam  Constitutional: He is oriented to person, place, and time and well-developed, well-nourished, and in no distress.  HENT:  Head: Normocephalic and atraumatic.  Right Ear: External ear normal.  Left Ear: External ear normal.  Nose: Nose normal.  Eyes: Conjunctivae and EOM are normal. Pupils are equal, round, and reactive to light.  Neck: Normal range of motion. Neck supple.  Cardiovascular: Normal rate, regular rhythm, normal heart sounds and intact distal pulses.   Pulmonary/Chest: Effort normal and breath sounds normal.  Neurological: He is alert and oriented to person, place, and time.  Skin: Skin is warm.  Psychiatric: Mood, memory, affect and judgment normal.  Tearful at times. Not suicidal.    Lab Results  Component Value Date   WBC 9.6 11/09/2014   HGB 15.5 11/09/2014   HCT 45 11/09/2014   PLT 275 11/09/2014   GLUCOSE 150* 03/07/2012   CHOL 173 11/09/2014   TRIG 114 11/09/2014   HDL 52 11/09/2014   LDLCALC 98 11/09/2014   TSH 0.98 11/09/2014    CMP     Component Value Date/Time   NA 138 11/09/2014   NA 138 03/07/2012 0646   K 4.5 11/09/2014   K 4.7 03/07/2012 0646   CL 96* 03/07/2012 0646   CO2 34* 03/07/2012 0646   GLUCOSE 150* 03/07/2012 0646   BUN 19 11/09/2014   BUN 16 03/07/2012 0646   CREATININE 1.2 11/09/2014   CREATININE 0.87 03/07/2012 0646   CALCIUM 9.5 03/07/2012 0646   PROT 8.4* 03/06/2012 1353   ALBUMIN 3.8 03/06/2012 1353   AST 19 11/09/2014   AST 50* 03/06/2012 1353   ALT 11 11/09/2014   ALT 26 03/06/2012 1353   ALKPHOS 70 11/09/2014   ALKPHOS 104 03/06/2012 1353   BILITOT 0.5 03/06/2012 1353   GFRNONAA >60 03/07/2012 0646   GFRAA >60 03/07/2012 0646    Assessment and Plan :  1. Acute anxiety RTC 2-4  weeks. - mirtazapine (REMERON) 30 MG tablet; Take 1 tablet (30 mg total) by mouth at bedtime. Take 30 minutes to 1 hours before bed.  Dispense: 30 tablet; Refill: 12  2. Depression This is situational depression but I truly think if he can sleep and anxiety is controlled we will do a little bit better. Strongly recommended counseling. - mirtazapine (REMERON) 30 MG tablet; Take 1 tablet (30 mg total) by mouth at bedtime. Take 30 minutes to 1 hours before bed.  Dispense: 30 tablet; Refill: 12   Patient was seen and examined by Dr. Miguel Aschoff, and noted scribed by Webb Laws, [facility] 09/26/2015 11:27 AM

## 2015-10-25 ENCOUNTER — Ambulatory Visit (INDEPENDENT_AMBULATORY_CARE_PROVIDER_SITE_OTHER): Payer: Medicare Other | Admitting: Family Medicine

## 2015-10-25 ENCOUNTER — Encounter: Payer: Self-pay | Admitting: Family Medicine

## 2015-10-25 VITALS — BP 122/70 | HR 76 | Temp 97.7°F | Resp 16 | Wt 200.0 lb

## 2015-10-25 DIAGNOSIS — F329 Major depressive disorder, single episode, unspecified: Secondary | ICD-10-CM

## 2015-10-25 DIAGNOSIS — F32A Depression, unspecified: Secondary | ICD-10-CM

## 2015-10-25 DIAGNOSIS — F419 Anxiety disorder, unspecified: Secondary | ICD-10-CM

## 2015-10-25 DIAGNOSIS — Z5181 Encounter for therapeutic drug level monitoring: Secondary | ICD-10-CM | POA: Diagnosis not present

## 2015-10-25 DIAGNOSIS — L400 Psoriasis vulgaris: Secondary | ICD-10-CM | POA: Diagnosis not present

## 2015-10-25 NOTE — Progress Notes (Signed)
Patient ID: Gregory Haynes, male   DOB: Dec 23, 1940, 75 y.o.   MRN: [id number]    Subjective:  HPI Depression/Anxiety- Pt was seen 1 month ago and was dealing with financial matters, he was started on Mirtazapine. He reports that he does not see much of a difference in himself but he asked his wife and she has noticed that he is more pleasurable to be around. He reports that he does seem to be sleeping better which in turn makes him feel better and cope better. He has not noticed any side effects on the medication.     Prior to Admission medications   Medication Sig Start Date End Date Taking? Authorizing Provider  ADALIMUMAB Inject into the skin.   Yes Historical Provider, MD  albuterol (PROAIR HFA) 108 (90 BASE) MCG/ACT inhaler Inhale into the lungs. 11/17/14  Yes Historical Provider, MD  aspirin 81 MG tablet Take by mouth. 04/16/12  Yes Historical Provider, MD  atorvastatin (LIPITOR) 40 MG tablet TAKE 1 TABLET EVERY DAY USUALLY IN THE EVENING 04/07/15  Yes Richard Maceo Pro., MD  carvedilol (COREG) 12.5 MG tablet TAKE ONE TABLET EVERY TWELVE HOURS 06/08/15  Yes Jerrol Banana., MD  hydrochlorothiazide (HYDRODIURIL) 25 MG tablet Take by mouth. 12/22/14  Yes Historical Provider, MD  methotrexate (RHEUMATREX) 2.5 MG tablet  09/22/15  Yes Historical Provider, MD  mirtazapine (REMERON) 30 MG tablet Take 1 tablet (30 mg total) by mouth at bedtime. Take 30 minutes to 1 hours before bed. 09/26/15  Yes Jerrol Banana., MD  pantoprazole (PROTONIX) 40 MG tablet TAKE 1 TABLET EVERY DAY 05/22/15  Yes Jerrol Banana., MD  SPIRIVA HANDIHALER 18 MCG inhalation capsule TAKE CONTENTS OF ONE CAPSULE VIA HANDIHALER ONCE DAILY 01/23/15  Yes Jerrol Banana., MD  SYMBICORT 160-4.5 MCG/ACT inhaler  09/22/15  Yes Historical Provider, MD    Patient Active Problem List   Diagnosis Date Noted  . 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 CAFL (chronic airflow limitation) ([place]) 05/10/2015  . COPD, moderate  ([place]) 05/10/2015  . Esophagitis, reflux 05/10/2015  . BP (high blood pressure) 05/10/2015  . Positive H. pylori test 05/10/2015  . 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 Psoriasis 05/10/2015  . 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 Respiratory failure with hypercapnia ([place]) 03/07/2012  . Fam hx-ischem heart disease 03/23/2010  . Hypercholesteremia 03/23/2010  . Current tobacco use 03/23/2010    History reviewed. No pertinent past medical history.  Social History   Social History  . Marital Status: Married    Spouse Name: N/A  . Number of Children: N/A  . Years of Education: N/A   Occupational History  . Not on file.   Social History Main Topics  . Smoking status: Former Smoker -- 1.50 packs/day for 55 years    Types: Cigarettes    Quit date: 08/13/2011  . Smokeless tobacco: Never Used  . Alcohol Use: Yes     Comment: social  . Drug Use: No  . Sexual Activity: Not on file   Other Topics Concern  . Not on file   Social History Narrative    No Known Allergies  Review of Systems  Constitutional: Negative.   HENT: Negative.   Eyes: Negative.   Respiratory: Negative.   Cardiovascular: Negative.   Gastrointestinal: Negative.   Genitourinary: Negative.   Musculoskeletal: Negative.   Skin: Negative.   Neurological: Negative.   Endo/Heme/Allergies: Negative.   Psychiatric/Behavioral: Negative.  Immunization History  Administered Date(s) Administered  . Pneumococcal Conjugate-13 11/10/2014  . Tdap 11/05/2011  . Zoster 11/05/2011   Objective:  BP 122/70 mmHg  Pulse 76  Temp(Src) 97.7 F (36.5 C) (Oral)  Resp 16  Wt 200 lb (90.719 kg)  Physical Exam  Constitutional: He is oriented to person, place, and time and well-developed, well-nourished, and in no distress.  HENT:  Head: Normocephalic and atraumatic.  Right Ear: External ear normal.  Left Ear: External ear normal.  Nose: Nose normal.  Eyes: Conjunctivae are  normal.  Cardiovascular: Normal rate.   Pulmonary/Chest: Effort normal and breath sounds normal.  Abdominal: Soft.  Neurological: He is alert and oriented to person, place, and time.  Skin: Skin is warm and dry.  Psychiatric: Mood, memory, affect and judgment normal.    Lab Results  Component Value Date   WBC 9.6 11/09/2014   HGB 15.5 11/09/2014   HCT 45 11/09/2014   PLT 275 11/09/2014   GLUCOSE 150* 03/07/2012   CHOL 173 11/09/2014   TRIG 114 11/09/2014   HDL 52 11/09/2014   LDLCALC 98 11/09/2014   TSH 0.98 11/09/2014    CMP     Component Value Date/Time   NA 138 11/09/2014   NA 138 03/07/2012 0646   K 4.5 11/09/2014   K 4.7 03/07/2012 0646   CL 96* 03/07/2012 0646   CO2 34* 03/07/2012 0646   GLUCOSE 150* 03/07/2012 0646   BUN 19 11/09/2014   BUN 16 03/07/2012 0646   CREATININE 1.2 11/09/2014   CREATININE 0.87 03/07/2012 0646   CALCIUM 9.5 03/07/2012 0646   PROT 8.4* 03/06/2012 1353   ALBUMIN 3.8 03/06/2012 1353   AST 19 11/09/2014   AST 50* 03/06/2012 1353   ALT 11 11/09/2014   ALT 26 03/06/2012 1353   ALKPHOS 70 11/09/2014   ALKPHOS 104 03/06/2012 1353   BILITOT 0.5 03/06/2012 1353   GFRNONAA >60 03/07/2012 0646   GFRAA >60 03/07/2012 0646    Assessment and Plan :  1. Depression Insomnia, depression, and anxiety markedly improved, mainly wife says it is improved. Patient does admit that he feels better.RTC 3 months.  2. Acute anxiety  3. Chronic COPD  Miguel Aschoff MD [facility] 10/25/2015 11:14 AM

## 2015-10-31 DIAGNOSIS — L4050 Arthropathic psoriasis, unspecified: Secondary | ICD-10-CM | POA: Insufficient documentation

## 2015-10-31 DIAGNOSIS — J432 Centrilobular emphysema: Secondary | ICD-10-CM | POA: Diagnosis not present

## 2015-11-08 ENCOUNTER — Ambulatory Visit: Payer: Medicare Other | Admitting: Family Medicine

## 2015-11-14 DIAGNOSIS — J432 Centrilobular emphysema: Secondary | ICD-10-CM | POA: Diagnosis not present

## 2015-11-14 DIAGNOSIS — Z87891 Personal history of nicotine dependence: Secondary | ICD-10-CM | POA: Diagnosis not present

## 2015-11-16 ENCOUNTER — Other Ambulatory Visit: Payer: Self-pay | Admitting: Family Medicine

## 2016-01-05 ENCOUNTER — Other Ambulatory Visit: Payer: Self-pay | Admitting: Family Medicine

## 2016-01-15 ENCOUNTER — Other Ambulatory Visit: Payer: Self-pay | Admitting: Family Medicine

## 2016-01-29 DIAGNOSIS — X32XXXA Exposure to sunlight, initial encounter: Secondary | ICD-10-CM | POA: Diagnosis not present

## 2016-01-29 DIAGNOSIS — C4401 Basal cell carcinoma of skin of lip: Secondary | ICD-10-CM | POA: Diagnosis not present

## 2016-01-29 DIAGNOSIS — L400 Psoriasis vulgaris: Secondary | ICD-10-CM | POA: Diagnosis not present

## 2016-01-29 DIAGNOSIS — L570 Actinic keratosis: Secondary | ICD-10-CM | POA: Diagnosis not present

## 2016-01-29 DIAGNOSIS — Z85828 Personal history of other malignant neoplasm of skin: Secondary | ICD-10-CM | POA: Diagnosis not present

## 2016-01-29 DIAGNOSIS — D485 Neoplasm of uncertain behavior of skin: Secondary | ICD-10-CM | POA: Diagnosis not present

## 2016-02-01 DIAGNOSIS — Z79899 Other long term (current) drug therapy: Secondary | ICD-10-CM | POA: Diagnosis not present

## 2016-02-01 DIAGNOSIS — L400 Psoriasis vulgaris: Secondary | ICD-10-CM | POA: Diagnosis not present

## 2016-02-19 ENCOUNTER — Other Ambulatory Visit: Payer: Self-pay | Admitting: Family Medicine

## 2016-04-04 DIAGNOSIS — C4401 Basal cell carcinoma of skin of lip: Secondary | ICD-10-CM | POA: Diagnosis not present

## 2016-04-04 DIAGNOSIS — L905 Scar conditions and fibrosis of skin: Secondary | ICD-10-CM | POA: Diagnosis not present

## 2016-04-16 DIAGNOSIS — R112 Nausea with vomiting, unspecified: Secondary | ICD-10-CM | POA: Diagnosis not present

## 2016-04-30 ENCOUNTER — Ambulatory Visit (INDEPENDENT_AMBULATORY_CARE_PROVIDER_SITE_OTHER): Payer: Medicare Other | Admitting: Family Medicine

## 2016-04-30 ENCOUNTER — Encounter: Payer: Self-pay | Admitting: Family Medicine

## 2016-04-30 VITALS — BP 128/66 | HR 80 | Temp 98.2°F | Resp 14 | Ht 71.75 in | Wt 197.0 lb

## 2016-04-30 DIAGNOSIS — R739 Hyperglycemia, unspecified: Secondary | ICD-10-CM | POA: Diagnosis not present

## 2016-04-30 DIAGNOSIS — Z23 Encounter for immunization: Secondary | ICD-10-CM

## 2016-04-30 DIAGNOSIS — Z0000 Encounter for general adult medical examination without abnormal findings: Secondary | ICD-10-CM | POA: Diagnosis not present

## 2016-04-30 DIAGNOSIS — E049 Nontoxic goiter, unspecified: Secondary | ICD-10-CM | POA: Diagnosis not present

## 2016-04-30 DIAGNOSIS — I10 Essential (primary) hypertension: Secondary | ICD-10-CM | POA: Diagnosis not present

## 2016-04-30 DIAGNOSIS — Z1211 Encounter for screening for malignant neoplasm of colon: Secondary | ICD-10-CM

## 2016-04-30 DIAGNOSIS — E7800 Pure hypercholesterolemia, unspecified: Secondary | ICD-10-CM | POA: Diagnosis not present

## 2016-04-30 NOTE — Progress Notes (Signed)
Patient: Gregory Haynes, Male    DOB: Nov 03, 1940, 75 y.o.   MRN: [id number] Visit Date: 04/30/2016  Today's Provider: Wilhemena Durie, MD   Chief Complaint  Patient presents with  . Medicare Wellness   Subjective:   Gregory Haynes is a 75 y.o. male who presents today for his Subsequent Annual Wellness Visit. He feels well. He reports exercising walking daily. He reports he is sleeping well. Immunization History  Administered Date(s) Administered  . Pneumococcal Conjugate-13 11/10/2014  . Tdap 11/05/2011  . Zoster 11/05/2011   Last colonoscopy was 11/19/12 per patient he thinks he was told to repeat in 10 years, I could not locate path report.  Review of Systems  Constitutional: Negative.   HENT: Negative.   Eyes: Negative.   Respiratory: Negative.   Cardiovascular: Negative.   Gastrointestinal: Negative.   Endocrine: Negative.   Genitourinary: Negative.   Musculoskeletal: Negative.   Skin: Negative.   Allergic/Immunologic: Negative.   Neurological: Negative.   Hematological: Negative.   Psychiatric/Behavioral: Negative.     Patient Active Problem List   Diagnosis Date Noted  . 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 CAFL (chronic airflow limitation) ([place]) 05/10/2015  . COPD, moderate ([place]) 05/10/2015  . Esophagitis, reflux 05/10/2015  . BP (high blood pressure) 05/10/2015  . Positive H. pylori test 05/10/2015  . 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 Psoriasis 05/10/2015  . 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 Respiratory failure with hypercapnia ([place]) 03/07/2012  . Fam hx-ischem heart disease 03/23/2010  . Hypercholesteremia 03/23/2010  . Current tobacco use 03/23/2010    Social History   Social History  . Marital status: Married    Spouse name: N/A  . Number of children: N/A  . Years of education: N/A   Occupational History  . Not on file.   Social History Main Topics  . Smoking status: Former Smoker    Packs/day: 1.50     Years: 55.00    Types: Cigarettes    Quit date: 08/13/2011  . Smokeless tobacco: Never Used  . Alcohol use Yes     Comment: social  . Drug use: No  . Sexual activity: Not on file   Other Topics Concern  . Not on file   Social History Narrative  . No narrative on file    Past Surgical History:  Procedure Laterality Date  . NO PAST SURGERIES      His family history includes Alcohol abuse in his brother; Heart attack in his father; Heart disease in his brother; Hyperlipidemia in his brother.    Outpatient Encounter Prescriptions as of 04/30/2016  Medication Sig Note  . ADALIMUMAB Inject into the skin. 05/10/2015: Received from: [facility]  . albuterol (PROAIR HFA) 108 (90 BASE) MCG/ACT inhaler Inhale into the lungs. 05/10/2015: Medication taken as needed.  Received from: [facility]  . aspirin 81 MG tablet Take by mouth. 05/10/2015: Received from: [facility]  . atorvastatin (LIPITOR) 40 MG tablet TAKE 1 TABLET EVERY DAY USUALLY IN THE EVENING   . BREO ELLIPTA 200-25 MCG/INH AEPB Take 1 puff by mouth daily. 04/30/2016: Received from: External Pharmacy  . carvedilol (COREG) 12.5 MG tablet TAKE ONE TABLET EVERY TWELVE HOURS   . hydrochlorothiazide (HYDRODIURIL) 25 MG tablet TAKE 1 TABLET EVERY DAY   . methotrexate (RHEUMATREX) 2.5 MG tablet  09/26/2015: Received from: External Pharmacy  . mirtazapine (REMERON) 30 MG tablet Take 1 tablet (30 mg total) by mouth at bedtime.  Take 30 minutes to 1 hours before bed.   . pantoprazole (PROTONIX) 40 MG tablet TAKE 1 TABLET EVERY DAY   . SPIRIVA HANDIHALER 18 MCG inhalation capsule TAKE CONTENTS OF ONE CAPSULE VIA HANDIHALER ONCE DAILY   . [DISCONTINUED] SYMBICORT 160-4.5 MCG/ACT inhaler  09/26/2015: Received from: External Pharmacy   No facility-administered encounter medications on file as of 04/30/2016.     No Known Allergies  Patient Care Team: Jerrol Banana., MD as PCP - General  (Family Medicine)  Objective:   Vitals:  Vitals:   04/30/16 0925  BP: 128/66  Pulse: 80  Resp: 14  Temp: 98.2 F (36.8 C)  Weight: 197 lb (89.4 kg)  Height: 5' 11.75" (1.822 m)    Physical Exam  Constitutional: He is oriented to person, place, and time. He appears well-developed and well-nourished.  HENT:  Head: Normocephalic and atraumatic.  Right Ear: External ear normal.  Left Ear: External ear normal.  Nose: Nose normal.  Mouth/Throat: Oropharynx is clear and moist.  Eyes: Conjunctivae are normal. Pupils are equal, round, and reactive to light.  Neck: Normal range of motion. Neck supple. Thyromegaly present.  thyroid nodule  isthmus  Cardiovascular: Normal rate, regular rhythm, normal heart sounds and intact distal pulses.   No murmur heard. Pulmonary/Chest: Effort normal and breath sounds normal. No respiratory distress.  Abdominal: Soft. He exhibits no distension. There is no tenderness.  Musculoskeletal: He exhibits no edema or tenderness.  Neurological: He is alert and oriented to person, place, and time.  Skin: No rash noted. No erythema.  Psychiatric: He has a normal mood and affect. His behavior is normal. Judgment and thought content normal.    Activities of Daily Living In your present state of health, do you have any difficulty performing the following activities: 04/30/2016 05/11/2015  Hearing? Y N  Vision? N N  Difficulty concentrating or making decisions? N N  Walking or climbing stairs? N N  Dressing or bathing? N N  Doing errands, shopping? N N  Some recent data might be hidden    Fall Risk Assessment Fall Risk  04/30/2016 05/11/2015  Falls in the past year? No No     Depression Screen PHQ 2/9 Scores 04/30/2016 09/26/2015 05/11/2015  PHQ - 2 Score 0 2 0  PHQ- 9 Score - 10 -    Cognitive Testing - 6-CIT    Year: 0 4 points  Month: 0 3 points  Memorize "Pia Mau, [address] [place]"  Time (within 1 hour:) 0 3 points  Count backwards from  20: 0 2 4 points  Name months of year: 0 2 4 points  Repeat Address: 0 2 4 6 8 10  points   Total Score: 4/28  Interpretation : Normal (0-7) Abnormal (8-28)    Assessment & Plan:     Annual Wellness Visit  Reviewed patient's Family Medical History Reviewed and updated list of patient's medical providers Assessment of cognitive impairment was done Assessed patient's functional ability Established a written schedule for health screening [place] Completed and Reviewed  1. Medicare annual wellness visit, subsequent  2. Colon cancer screening  3. Need for pneumococcal vaccination - Pneumococcal polysaccharide vaccine 23-valent greater than or equal to 2yo subcutaneous/IM  4. Need for influenza vaccination - Flu vaccine HIGH DOSE PF (Fluzone High dose)  5. Hyperglycemia - HgB A1c  6. Essential hypertension - CBC w/Diff/Platelet - Comprehensive metabolic panel  7. Hypercholesteremia - Comprehensive metabolic panel - Lipid Panel With LDL/HDL  Ratio  8. Goiter/New thyroid nodule New. Refer to endocrinologist. - TSH - Ambulatory referral to Endocrinology  HPI, Exam and A&P transcribed under direction and in the presence of Miguel Aschoff, MD. I have done the exam and reviewed the chart and it is accurate to the best of my knowledge. Miguel Aschoff M.D. [facility]

## 2016-05-02 DIAGNOSIS — E7800 Pure hypercholesterolemia, unspecified: Secondary | ICD-10-CM | POA: Diagnosis not present

## 2016-05-02 DIAGNOSIS — R739 Hyperglycemia, unspecified: Secondary | ICD-10-CM | POA: Diagnosis not present

## 2016-05-02 DIAGNOSIS — I10 Essential (primary) hypertension: Secondary | ICD-10-CM | POA: Diagnosis not present

## 2016-05-02 DIAGNOSIS — E049 Nontoxic goiter, unspecified: Secondary | ICD-10-CM | POA: Diagnosis not present

## 2016-05-03 LAB — CBC WITH DIFFERENTIAL/PLATELET
BASOS: 0 %
Basophils Absolute: 0 10*3/uL (ref 0.0–0.2)
EOS (ABSOLUTE): 0.2 10*3/uL (ref 0.0–0.4)
EOS: 3 %
HEMATOCRIT: 40 % (ref 37.5–51.0)
Hemoglobin: 13.5 g/dL (ref 12.6–17.7)
IMMATURE GRANS (ABS): 0 10*3/uL (ref 0.0–0.1)
IMMATURE GRANULOCYTES: 0 %
Lymphocytes Absolute: 1.4 10*3/uL (ref 0.7–3.1)
Lymphs: 21 %
MCH: 31.5 pg (ref 26.6–33.0)
MCHC: 33.8 g/dL (ref 31.5–35.7)
MCV: 93 fL (ref 79–97)
MONOS ABS: 0.7 10*3/uL (ref 0.1–0.9)
Monocytes: 11 %
NEUTROS PCT: 65 %
Neutrophils Absolute: 4.2 10*3/uL (ref 1.4–7.0)
Platelets: 259 10*3/uL (ref 150–379)
RBC: 4.29 x10E6/uL (ref 4.14–5.80)
RDW: 14.8 % (ref 12.3–15.4)
WBC: 6.5 10*3/uL (ref 3.4–10.8)

## 2016-05-03 LAB — COMPREHENSIVE METABOLIC PANEL
A/G RATIO: 1.8 (ref 1.2–2.2)
ALBUMIN: 4.6 g/dL (ref 3.5–4.8)
ALT: 13 IU/L (ref 0–44)
AST: 19 IU/L (ref 0–40)
Alkaline Phosphatase: 70 IU/L (ref 39–117)
BUN / CREAT RATIO: 15 (ref 10–24)
BUN: 17 mg/dL (ref 8–27)
Bilirubin Total: 0.5 mg/dL (ref 0.0–1.2)
CALCIUM: 9.3 mg/dL (ref 8.6–10.2)
CO2: 31 mmol/L — ABNORMAL HIGH (ref 18–29)
Chloride: 93 mmol/L — ABNORMAL LOW (ref 96–106)
Creatinine, Ser: 1.15 mg/dL (ref 0.76–1.27)
GFR, EST AFRICAN AMERICAN: 72 mL/min/{1.73_m2} (ref >59)
GFR, EST NON AFRICAN AMERICAN: 62 mL/min/{1.73_m2} (ref >59)
GLOBULIN, TOTAL: 2.5 g/dL (ref 1.5–4.5)
Glucose: 155 mg/dL — ABNORMAL HIGH (ref 65–99)
POTASSIUM: 4.7 mmol/L (ref 3.5–5.2)
SODIUM: 138 mmol/L (ref 134–144)
TOTAL PROTEIN: 7.1 g/dL (ref 6.0–8.5)

## 2016-05-03 LAB — HEMOGLOBIN A1C
ESTIMATED AVERAGE GLUCOSE: 134 mg/dL
Hgb A1c MFr Bld: 6.3 % — ABNORMAL HIGH (ref 4.8–5.6)

## 2016-05-03 LAB — TSH: TSH: 1.82 u[IU]/mL (ref 0.450–4.500)

## 2016-05-03 LAB — LIPID PANEL WITH LDL/HDL RATIO
Cholesterol, Total: 178 mg/dL (ref 100–199)
HDL: 55 mg/dL (ref >39)
LDL Calculated: 100 mg/dL — ABNORMAL HIGH (ref 0–99)
LDL/HDL RATIO: 1.8 ratio (ref 0.0–3.6)
Triglycerides: 114 mg/dL (ref 0–149)
VLDL Cholesterol Cal: 23 mg/dL (ref 5–40)

## 2016-05-08 ENCOUNTER — Other Ambulatory Visit: Payer: Self-pay | Admitting: Family Medicine

## 2016-06-13 ENCOUNTER — Other Ambulatory Visit: Payer: Self-pay | Admitting: Family Medicine

## 2016-06-20 DIAGNOSIS — E010 Iodine-deficiency related diffuse (endemic) goiter: Secondary | ICD-10-CM | POA: Diagnosis not present

## 2016-07-01 DIAGNOSIS — E010 Iodine-deficiency related diffuse (endemic) goiter: Secondary | ICD-10-CM | POA: Diagnosis not present

## 2016-07-08 ENCOUNTER — Encounter: Payer: Self-pay | Admitting: Family Medicine

## 2016-07-08 DIAGNOSIS — D485 Neoplasm of uncertain behavior of skin: Secondary | ICD-10-CM | POA: Diagnosis not present

## 2016-07-08 DIAGNOSIS — Z08 Encounter for follow-up examination after completed treatment for malignant neoplasm: Secondary | ICD-10-CM | POA: Diagnosis not present

## 2016-07-08 DIAGNOSIS — L570 Actinic keratosis: Secondary | ICD-10-CM | POA: Diagnosis not present

## 2016-07-08 DIAGNOSIS — D0439 Carcinoma in situ of skin of other parts of face: Secondary | ICD-10-CM | POA: Diagnosis not present

## 2016-07-08 DIAGNOSIS — L400 Psoriasis vulgaris: Secondary | ICD-10-CM | POA: Diagnosis not present

## 2016-07-08 DIAGNOSIS — X32XXXA Exposure to sunlight, initial encounter: Secondary | ICD-10-CM | POA: Diagnosis not present

## 2016-07-08 DIAGNOSIS — Z85828 Personal history of other malignant neoplasm of skin: Secondary | ICD-10-CM | POA: Diagnosis not present

## 2016-07-08 DIAGNOSIS — Z79899 Other long term (current) drug therapy: Secondary | ICD-10-CM | POA: Diagnosis not present

## 2016-07-23 DIAGNOSIS — L905 Scar conditions and fibrosis of skin: Secondary | ICD-10-CM | POA: Diagnosis not present

## 2016-07-23 DIAGNOSIS — D0439 Carcinoma in situ of skin of other parts of face: Secondary | ICD-10-CM | POA: Diagnosis not present

## 2016-08-20 ENCOUNTER — Telehealth: Payer: Self-pay | Admitting: Family Medicine

## 2016-08-20 MED ORDER — AZITHROMYCIN 250 MG PO TABS: ORAL_TABLET | ORAL | 0 refills | Status: DC

## 2016-08-20 NOTE — Telephone Encounter (Signed)
Please review-aa 

## 2016-08-20 NOTE — Telephone Encounter (Signed)
Pt's wife Tessie Fass stated that pt has a lot of chest congestion that started a few days ago. Patsy stated that Dr. Rosanna Randy has advised her in the past that when pt gets chest congestion to call the office and request a Z pack be sent to pharmacy due to pt's COPD. Patsy would like the Rx sent to [facility]. Please advise. Thanks TNP

## 2016-08-20 NOTE — Telephone Encounter (Signed)
Rx sent in and Patsy advised. Renaldo Fiddler, CMA

## 2016-08-20 NOTE — Telephone Encounter (Signed)
Okay to do so? 

## 2016-08-27 DIAGNOSIS — R911 Solitary pulmonary nodule: Secondary | ICD-10-CM | POA: Diagnosis not present

## 2016-08-27 DIAGNOSIS — J410 Simple chronic bronchitis: Secondary | ICD-10-CM | POA: Diagnosis not present

## 2016-10-30 ENCOUNTER — Ambulatory Visit: Payer: Medicare Other | Admitting: Family Medicine

## 2016-11-25 DIAGNOSIS — I2510 Atherosclerotic heart disease of native coronary artery without angina pectoris: Secondary | ICD-10-CM | POA: Diagnosis not present

## 2016-11-25 DIAGNOSIS — R911 Solitary pulmonary nodule: Secondary | ICD-10-CM | POA: Diagnosis not present

## 2016-12-09 DIAGNOSIS — R918 Other nonspecific abnormal finding of lung field: Secondary | ICD-10-CM | POA: Diagnosis not present

## 2016-12-16 ENCOUNTER — Ambulatory Visit
Admission: RE | Admit: 2016-12-16 | Discharge: 2016-12-16 | Disposition: A | Discharge status: Home / Self Care | Payer: Medicare Other | Source: Ambulatory Visit | Attending: Family Medicine | Admitting: Family Medicine

## 2016-12-16 ENCOUNTER — Ambulatory Visit (INDEPENDENT_AMBULATORY_CARE_PROVIDER_SITE_OTHER): Payer: Medicare Other | Admitting: Family Medicine

## 2016-12-16 ENCOUNTER — Encounter: Payer: Self-pay | Admitting: Family Medicine

## 2016-12-16 VITALS — BP 116/68 | HR 78 | Temp 98.1°F | Resp 16 | Wt 204.0 lb

## 2016-12-16 DIAGNOSIS — M79672 Pain in left foot: Secondary | ICD-10-CM | POA: Diagnosis not present

## 2016-12-16 DIAGNOSIS — M10072 Idiopathic gout, left ankle and foot: Secondary | ICD-10-CM

## 2016-12-16 MED ORDER — INDOMETHACIN 50 MG PO CAPS: 50.0000 mg | ORAL_CAPSULE | Freq: Two times a day (BID) | ORAL | 0 refills | Status: DC | PRN

## 2016-12-16 MED ORDER — COLCHICINE 0.6 MG PO TABS: 0.6000 mg | ORAL_TABLET | Freq: Two times a day (BID) | ORAL | 5 refills | Status: DC

## 2016-12-16 NOTE — Progress Notes (Signed)
Patient: Gregory Haynes Male    DOB: Apr 16, 1941   76 y.o.   MRN: [id number] Visit Date: 12/16/2016  Today's Provider: Wilhemena Durie, MD   Chief Complaint  Patient presents with  . Toe Pain   Subjective:    Toe Pain   There was no injury mechanism. The pain is present in the left toes. The quality of the pain is described as aching, shooting and stabbing. The pain is at a severity of 5/10. The pain is moderate (to severe ). The pain has been constant since onset. Associated symptoms include an inability to bear weight. He reports no foreign bodies present. The symptoms are aggravated by movement and weight bearing. He has tried nothing for the symptoms.    Patient has had pain, redness, and swelling in his left great toe X 3 days. He feels that this could be gout.     No Known Allergies   Current Outpatient Prescriptions:  .  ADALIMUMAB, Inject into the skin., Disp: , Rfl:  .  albuterol (PROAIR HFA) 108 (90 BASE) MCG/ACT inhaler, Inhale into the lungs., Disp: , Rfl:  .  aspirin 81 MG tablet, Take by mouth., Disp: , Rfl:  .  atorvastatin (LIPITOR) 40 MG tablet, TAKE ONE TABLET  EACH EVENING, Disp: 30 tablet, Rfl: 11 .  BREO ELLIPTA 200-25 MCG/INH AEPB, Take 1 puff by mouth daily., Disp: , Rfl: 10 .  carvedilol (COREG) 12.5 MG tablet, TAKE ONE TABLET EVERY TWELVE HOURS, Disp: 60 tablet, Rfl: 12 .  hydrochlorothiazide (HYDRODIURIL) 25 MG tablet, TAKE 1 TABLET EVERY DAY, Disp: 30 tablet, Rfl: 12 .  methotrexate (RHEUMATREX) 2.5 MG tablet, , Disp: , Rfl: 2 .  mirtazapine (REMERON) 30 MG tablet, Take 1 tablet (30 mg total) by mouth at bedtime. Take 30 minutes to 1 hours before bed., Disp: 30 tablet, Rfl: 12 .  pantoprazole (PROTONIX) 40 MG tablet, TAKE 1 TABLET EVERY DAY, Disp: 90 tablet, Rfl: 3 .  SPIRIVA HANDIHALER 18 MCG inhalation capsule, TAKE CONTENTS OF ONE CAPSULE VIA HANDIHALER ONCE DAILY, Disp: 30 capsule, Rfl: 11  Review of Systems  Constitutional: Positive for  activity change. Negative for appetite change, chills, diaphoresis, fatigue, fever and unexpected weight change.  Musculoskeletal: Positive for arthralgias, gait problem, joint swelling and myalgias.  Skin: Positive for color change. Negative for pallor, rash and wound.  Psychiatric/Behavioral: Negative.     Social History  Substance Use Topics  . Smoking status: Former Smoker    Packs/day: 1.50    Years: 55.00    Types: Cigarettes    Quit date: 08/13/2011  . Smokeless tobacco: Never Used  . Alcohol use Yes     Comment: social   Objective:   BP 116/68 (BP Location: Right Arm, Patient Position: Sitting, Cuff Size: Normal)   Pulse 78   Temp 98.1 F (36.7 C)   Resp 16   Wt 204 lb (92.5 kg)   SpO2 98%   BMI 27.86 kg/m  Vitals:   12/16/16 1602  BP: 116/68  Pulse: 78  Resp: 16  Temp: 98.1 F (36.7 C)  SpO2: 98%  Weight: 204 lb (92.5 kg)     Physical Exam  Constitutional: He is oriented to person, place, and time. He appears well-developed and well-nourished.  HENT:  Head: Normocephalic and atraumatic.  Eyes: Conjunctivae are normal. No scleral icterus.  Neck: No thyromegaly present.  Cardiovascular: Normal rate, regular rhythm and normal heart sounds.   Pulmonary/Chest: Effort normal  and breath sounds normal.  Abdominal: Soft.  Musculoskeletal: He exhibits edema and tenderness.  Neurological: He is alert and oriented to person, place, and time.  Skin: Skin is warm and dry.  Psychiatric: He has a normal mood and affect. His behavior is normal. Judgment and thought content normal.        Assessment & Plan:     1. Left foot pain Possible gout in left great toe. Treat as below. Hold STATIN while on medications. F/U in the office in 3 days.  - DG Foot Complete Left; Future - colchicine 0.6 MG tablet; Take 1 tablet (0.6 mg total) by mouth 2 (two) times daily.  Dispense: 60 tablet; Refill: 5 - CBC with Differential/Platelet - Uric acid - indomethacin (INDOCIN) 50 MG  capsule; Take 1 capsule (50 mg total) by mouth 2 (two) times daily as needed.  Dispense: 60 capsule; Refill: 0       I have done the exam and reviewed the above chart and it is accurate to the best of my knowledge. Development worker, community has been used in this note in any air is in the dictation or transcription are unintentional.  Wilhemena Durie, MD  [place]

## 2016-12-17 ENCOUNTER — Telehealth: Payer: Self-pay

## 2016-12-17 LAB — CBC WITH DIFFERENTIAL/PLATELET
Basophils Absolute: 0 10*3/uL (ref 0.0–0.2)
Basos: 0 %
EOS (ABSOLUTE): 0.1 10*3/uL (ref 0.0–0.4)
Eos: 2 %
HEMATOCRIT: 40 % (ref 37.5–51.0)
Hemoglobin: 13.6 g/dL (ref 13.0–17.7)
Immature Grans (Abs): 0 10*3/uL (ref 0.0–0.1)
Immature Granulocytes: 0 %
Lymphocytes Absolute: 2.2 10*3/uL (ref 0.7–3.1)
Lymphs: 24 %
MCH: 30.7 pg (ref 26.6–33.0)
MCHC: 34 g/dL (ref 31.5–35.7)
MCV: 90 fL (ref 79–97)
MONOS ABS: 0.9 10*3/uL (ref 0.1–0.9)
Monocytes: 9 %
NEUTROS PCT: 65 %
Neutrophils Absolute: 5.9 10*3/uL (ref 1.4–7.0)
Platelets: 272 10*3/uL (ref 150–379)
RBC: 4.43 x10E6/uL (ref 4.14–5.80)
RDW: 15.8 % — AB (ref 12.3–15.4)
WBC: 9.1 10*3/uL (ref 3.4–10.8)

## 2016-12-17 LAB — URIC ACID: Uric Acid: 7.3 mg/dL (ref 3.7–8.6)

## 2016-12-17 NOTE — Telephone Encounter (Signed)
-----   Message from Jerrol Banana., MD sent at 12/17/2016  8:20 AM EDT ----- No fracture --possible gout.

## 2016-12-17 NOTE — Telephone Encounter (Signed)
Advised patient as below. Patient reports that he only got a 3 day supply of the medication sent yesterday (colchicine 0.6mg ) until he was seen on Thursday (5/10) to confirm if he had gout. Since xray showed possible gout patient reports that he will get the rest of the medication today.   Patient also had a few questions: Since we know its possible gout, does he still need to be seen Thurs? Does how long does he need to stay off of his cholesterol medication?  Will he need to take the colchicine daily to prevent flares? If so, what will he take for his cholesterol.  I recommended to the patient that he may need to keep his appt to discuss any other questions he may have. Also, please review labs if you haven't done so already.   Thanks Dr. Darnell Level!

## 2016-12-17 NOTE — Telephone Encounter (Signed)
Yes to Thursday appt.

## 2016-12-17 NOTE — Telephone Encounter (Signed)
Left message to call back  

## 2016-12-17 NOTE — Telephone Encounter (Signed)
Pt advised-aa 

## 2016-12-19 ENCOUNTER — Ambulatory Visit (INDEPENDENT_AMBULATORY_CARE_PROVIDER_SITE_OTHER): Payer: Medicare Other | Admitting: Family Medicine

## 2016-12-19 ENCOUNTER — Encounter: Payer: Self-pay | Admitting: Family Medicine

## 2016-12-19 VITALS — BP 124/64 | HR 70 | Temp 97.7°F | Resp 16 | Wt 204.0 lb

## 2016-12-19 DIAGNOSIS — M109 Gout, unspecified: Secondary | ICD-10-CM

## 2016-12-19 NOTE — Progress Notes (Signed)
Patient: Gregory Haynes Male    DOB: 02/20/1941   76 y.o.   MRN: [id number] Visit Date: 12/19/2016  Today's Provider: Wilhemena Durie, MD   Chief Complaint  Patient presents with  . Toe Pain   Subjective:    HPI Patient is here today for a follow up. Patient was last seen in the office 3 days ago with redness, swelling, and tenderness of his left great toe. Patient was treated with indomethacin 50mg  and colchicine 0.6mg  for his symptoms. His xray showed no fractures, but it did indicate possible gout. Patient reports that he feels much better.     No Known Allergies   Current Outpatient Prescriptions:  .  ADALIMUMAB, Inject into the skin., Disp: , Rfl:  .  albuterol (PROAIR HFA) 108 (90 BASE) MCG/ACT inhaler, Inhale into the lungs., Disp: , Rfl:  .  aspirin 81 MG tablet, Take by mouth., Disp: , Rfl:  .  atorvastatin (LIPITOR) 40 MG tablet, TAKE ONE TABLET  EACH EVENING, Disp: 30 tablet, Rfl: 11 .  BREO ELLIPTA 200-25 MCG/INH AEPB, Take 1 puff by mouth daily., Disp: , Rfl: 10 .  carvedilol (COREG) 12.5 MG tablet, TAKE ONE TABLET EVERY TWELVE HOURS, Disp: 60 tablet, Rfl: 12 .  colchicine 0.6 MG tablet, Take 1 tablet (0.6 mg total) by mouth 2 (two) times daily., Disp: 60 tablet, Rfl: 5 .  hydrochlorothiazide (HYDRODIURIL) 25 MG tablet, TAKE 1 TABLET EVERY DAY, Disp: 30 tablet, Rfl: 12 .  indomethacin (INDOCIN) 50 MG capsule, Take 1 capsule (50 mg total) by mouth 2 (two) times daily as needed., Disp: 60 capsule, Rfl: 0 .  methotrexate (RHEUMATREX) 2.5 MG tablet, , Disp: , Rfl: 2 .  mirtazapine (REMERON) 30 MG tablet, Take 1 tablet (30 mg total) by mouth at bedtime. Take 30 minutes to 1 hours before bed., Disp: 30 tablet, Rfl: 12 .  pantoprazole (PROTONIX) 40 MG tablet, TAKE 1 TABLET EVERY DAY, Disp: 90 tablet, Rfl: 3 .  SPIRIVA HANDIHALER 18 MCG inhalation capsule, TAKE CONTENTS OF ONE CAPSULE VIA HANDIHALER ONCE DAILY, Disp: 30 capsule, Rfl: 11  Review of Systems    Constitutional: Negative.   Musculoskeletal: Negative for arthralgias, gait problem, joint swelling and myalgias.  Skin: Negative for color change.    Social History  Substance Use Topics  . Smoking status: Former Smoker    Packs/day: 1.50    Years: 55.00    Types: Cigarettes    Quit date: 08/13/2011  . Smokeless tobacco: Never Used  . Alcohol use Yes     Comment: social   Objective:   BP 124/64 (BP Location: Right Arm, Patient Position: Sitting, Cuff Size: Normal)   Pulse 70   Temp 97.7 F (36.5 C)   Resp 16   Wt 204 lb (92.5 kg)   SpO2 97%   BMI 27.86 kg/m  Vitals:   12/19/16 1025  BP: 124/64  Pulse: 70  Resp: 16  Temp: 97.7 F (36.5 C)  SpO2: 97%  Weight: 204 lb (92.5 kg)     Physical Exam      Assessment & Plan:     1. Acute gout involving toe of left foot, unspecified cause Resolved. Start back on STATIN. If symptoms return, may consider allopurinol for treatment.  2.COPD      I have done the exam and reviewed the above chart and it is accurate to the best of my knowledge. Development worker, community has been used in this note  in any air is in the dictation or transcription are unintentional.  Wilhemena Durie, MD  [facility]

## 2016-12-26 ENCOUNTER — Ambulatory Visit: Payer: Self-pay | Admitting: Podiatry

## 2016-12-31 DIAGNOSIS — E041 Nontoxic single thyroid nodule: Secondary | ICD-10-CM | POA: Diagnosis not present

## 2017-01-07 DIAGNOSIS — E041 Nontoxic single thyroid nodule: Secondary | ICD-10-CM | POA: Diagnosis not present

## 2017-01-09 ENCOUNTER — Other Ambulatory Visit: Payer: Self-pay | Admitting: Family Medicine

## 2017-01-22 DIAGNOSIS — R42 Dizziness and giddiness: Secondary | ICD-10-CM | POA: Diagnosis not present

## 2017-01-29 DIAGNOSIS — Z87891 Personal history of nicotine dependence: Secondary | ICD-10-CM | POA: Diagnosis not present

## 2017-01-29 DIAGNOSIS — J449 Chronic obstructive pulmonary disease, unspecified: Secondary | ICD-10-CM | POA: Diagnosis not present

## 2017-01-29 DIAGNOSIS — R0609 Other forms of dyspnea: Secondary | ICD-10-CM | POA: Diagnosis not present

## 2017-01-29 DIAGNOSIS — J441 Chronic obstructive pulmonary disease with (acute) exacerbation: Secondary | ICD-10-CM | POA: Diagnosis not present

## 2017-02-04 DIAGNOSIS — R42 Dizziness and giddiness: Secondary | ICD-10-CM | POA: Diagnosis not present

## 2017-02-08 ENCOUNTER — Other Ambulatory Visit: Payer: Self-pay | Admitting: Family Medicine

## 2017-02-11 DIAGNOSIS — R42 Dizziness and giddiness: Secondary | ICD-10-CM | POA: Diagnosis not present

## 2017-02-13 DIAGNOSIS — J42 Unspecified chronic bronchitis: Secondary | ICD-10-CM | POA: Diagnosis not present

## 2017-03-04 DIAGNOSIS — J432 Centrilobular emphysema: Secondary | ICD-10-CM | POA: Diagnosis not present

## 2017-03-04 DIAGNOSIS — J42 Unspecified chronic bronchitis: Secondary | ICD-10-CM | POA: Diagnosis not present

## 2017-03-04 DIAGNOSIS — J9601 Acute respiratory failure with hypoxia: Secondary | ICD-10-CM | POA: Diagnosis not present

## 2017-03-04 DIAGNOSIS — R911 Solitary pulmonary nodule: Secondary | ICD-10-CM | POA: Diagnosis not present

## 2017-03-07 DIAGNOSIS — J9601 Acute respiratory failure with hypoxia: Secondary | ICD-10-CM | POA: Diagnosis not present

## 2017-04-15 ENCOUNTER — Telehealth: Payer: Self-pay | Admitting: Family Medicine

## 2017-04-15 NOTE — Telephone Encounter (Signed)
LM regarding need to schedule AWV w [facility] after 05/01/17 since he has traditional MCR.

## 2017-05-02 ENCOUNTER — Ambulatory Visit (INDEPENDENT_AMBULATORY_CARE_PROVIDER_SITE_OTHER): Payer: Medicare Other

## 2017-05-02 VITALS — BP 120/78 | HR 72 | Temp 97.7°F | Ht 72.0 in | Wt 204.8 lb

## 2017-05-02 DIAGNOSIS — Z0000 Encounter for general adult medical examination without abnormal findings: Secondary | ICD-10-CM | POA: Diagnosis not present

## 2017-05-02 NOTE — Patient Instructions (Signed)
Gregory Haynes , Thank you for taking time to come for your Medicare Wellness Visit. I appreciate your ongoing commitment to your health goals. Please review the following plan we discussed and let me know if I can assist you in the future.   Screening recommendations/referrals: Colonoscopy: up to date Recommended yearly ophthalmology/optometry visit for glaucoma screening and checkup Recommended yearly dental visit for hygiene and checkup  Vaccinations: Influenza vaccine: declined today Pneumococcal vaccine: completed series Tdap vaccine: up to date Shingles vaccine: completed 01/05/12  Advanced directives: Please bring a copy of your POA (Power of [place]) and/or Living Will to your next appointment.   Conditions/risks identified: Recommend increasing water intake to 4 glasses of water a day.    Next appointment: None, pt to set up appointment.   Preventive Care 9 Years and Older, Male Preventive care refers to lifestyle choices and visits with your health care provider that can promote health and wellness. What does preventive care include?  A yearly physical exam. This is also called an annual well check.  Dental exams once or twice a year.  Routine eye exams. Ask your health care provider how often you should have your eyes checked.  Personal lifestyle choices, including:  Daily care of your teeth and gums.  Regular physical activity.  Eating a healthy diet.  Avoiding tobacco and drug use.  Limiting alcohol use.  Practicing safe sex.  Taking low doses of aspirin every day.  Taking vitamin and mineral supplements as recommended by your health care provider. What happens during an annual well check? The services and screenings done by your health care provider during your annual well check will depend on your age, overall health, lifestyle risk factors, and family history of disease. Counseling  Your health care provider may ask you questions about your:  Alcohol  use.  Tobacco use.  Drug use.  Emotional well-being.  Home and relationship well-being.  Sexual activity.  Eating habits.  History of falls.  Memory and ability to understand (cognition).  Work and work Statistician. Screening  You may have the following tests or measurements:  Height, weight, and BMI.  Blood pressure.  Lipid and cholesterol levels. These may be checked every 5 years, or more frequently if you are over 63 years old.  Skin check.  Lung cancer screening. You may have this screening every year starting at age 35 if you have a 30-pack-year history of smoking and currently smoke or have quit within the past 15 years.  Fecal occult blood test (FOBT) of the stool. You may have this test every year starting at age 1.  Flexible sigmoidoscopy or colonoscopy. You may have a sigmoidoscopy every 5 years or a colonoscopy every 10 years starting at age 71.  Prostate cancer screening. Recommendations will vary depending on your family history and other risks.  Hepatitis C blood test.  Hepatitis B blood test.  Sexually transmitted disease (STD) testing.  Diabetes screening. This is done by checking your blood sugar (glucose) after you have not eaten for a while (fasting). You may have this done every 1-3 years.  Abdominal aortic aneurysm (AAA) screening. You may need this if you are a current or former smoker.  Osteoporosis. You may be screened starting at age 51 if you are at high risk. Talk with your health care provider about your test results, treatment options, and if necessary, the need for more tests. Vaccines  Your health care provider may recommend certain vaccines, such as:  Influenza vaccine. This  is recommended every year.  Tetanus, diphtheria, and acellular pertussis (Tdap, Td) vaccine. You may need a Td booster every 10 years.  Zoster vaccine. You may need this after age 43.  Pneumococcal 13-valent conjugate (PCV13) vaccine. One dose is  recommended after age 69.  Pneumococcal polysaccharide (PPSV23) vaccine. One dose is recommended after age 58. Talk to your health care provider about which screenings and vaccines you need and how often you need them. This information is not intended to replace advice given to you by your health care provider. Make sure you discuss any questions you have with your health care provider. Document Released: 08/25/2015 Document Revised: 04/17/2016 Document Reviewed: 05/30/2015 Elsevier Interactive Patient Education  2017 [place] Prevention in the Home Falls can cause injuries. They can happen to people of all ages. There are many things you can do to make your home safe and to help prevent falls. What can I do on the outside of my home?  Regularly fix the edges of walkways and driveways and fix any cracks.  Remove anything that might make you trip as you walk through a door, such as a raised step or threshold.  Trim any bushes or trees on the path to your home.  Use bright outdoor lighting.  Clear any walking paths of anything that might make someone trip, such as rocks or tools.  Regularly check to see if handrails are loose or broken. Make sure that both sides of any steps have handrails.  Any raised decks and porches should have guardrails on the edges.  Have any leaves, snow, or ice cleared regularly.  Use sand or salt on walking paths during winter.  Clean up any spills in your garage right away. This includes oil or grease spills. What can I do in the bathroom?  Use night lights.  Install grab bars by the toilet and in the tub and shower. Do not use towel bars as grab bars.  Use non-skid mats or decals in the tub or shower.  If you need to sit down in the shower, use a plastic, non-slip stool.  Keep the floor dry. Clean up any water that spills on the floor as soon as it happens.  Remove soap buildup in the tub or shower regularly.  Attach bath mats  securely with double-sided non-slip rug tape.  Do not have throw rugs and other things on the floor that can make you trip. What can I do in the bedroom?  Use night lights.  Make sure that you have a light by your bed that is easy to reach.  Do not use any sheets or blankets that are too big for your bed. They should not hang down onto the floor.  Have a firm chair that has side arms. You can use this for support while you get dressed.  Do not have throw rugs and other things on the floor that can make you trip. What can I do in the kitchen?  Clean up any spills right away.  Avoid walking on wet floors.  Keep items that you use a lot in easy-to-reach places.  If you need to reach something above you, use a strong step stool that has a grab bar.  Keep electrical cords out of the way.  Do not use floor polish or wax that makes floors slippery. If you must use wax, use non-skid floor wax.  Do not have throw rugs and other things on the floor that can make you  trip. What can I do with my stairs?  Do not leave any items on the stairs.  Make sure that there are handrails on both sides of the stairs and use them. Fix handrails that are broken or loose. Make sure that handrails are as long as the stairways.  Check any carpeting to make sure that it is firmly attached to the stairs. Fix any carpet that is loose or worn.  Avoid having throw rugs at the top or bottom of the stairs. If you do have throw rugs, attach them to the floor with carpet tape.  Make sure that you have a light switch at the top of the stairs and the bottom of the stairs. If you do not have them, ask someone to add them for you. What else can I do to help prevent falls?  Wear shoes that:  Do not have high heels.  Have rubber bottoms.  Are comfortable and fit you well.  Are closed at the toe. Do not wear sandals.  If you use a stepladder:  Make sure that it is fully opened. Do not climb a closed  stepladder.  Make sure that both sides of the stepladder are locked into place.  Ask someone to hold it for you, if possible.  Clearly mark and make sure that you can see:  Any grab bars or handrails.  First and last steps.  Where the edge of each step is.  Use tools that help you move around (mobility aids) if they are needed. These include:  Canes.  Walkers.  Scooters.  Crutches.  Turn on the lights when you go into a dark area. Replace any light bulbs as soon as they burn out.  Set up your furniture so you have a clear path. Avoid moving your furniture around.  If any of your floors are uneven, fix them.  If there are any pets around you, be aware of where they are.  Review your medicines with your doctor. Some medicines can make you feel dizzy. This can increase your chance of falling. Ask your doctor what other things that you can do to help prevent falls. This information is not intended to replace advice given to you by your health care provider. Make sure you discuss any questions you have with your health care provider. Document Released: 05/25/2009 Document Revised: 01/04/2016 Document Reviewed: 09/02/2014 Elsevier Interactive Patient Education  2017 [employer].

## 2017-05-02 NOTE — Progress Notes (Signed)
Subjective:   Gregory Haynes is a 76 y.o. male who presents for Medicare Annual/Subsequent preventive examination.  Review of Systems:  N/A   Cardiac Risk Factors include: advanced age (>75men, >62 women);dyslipidemia;hypertension;male gender     Objective:    Vitals: BP 120/78 (BP Location: Left Arm)   Pulse 72   Temp 97.7 F (36.5 C) (Oral)   Ht 6' (1.829 m)   Wt 204 lb 12.8 oz (92.9 kg)   BMI 27.78 kg/m   Body mass index is 27.78 kg/m.  Tobacco History  Smoking Status  . Former Smoker  . Packs/day: 1.50  . Years: 55.00  . Types: Cigarettes  . Quit date: 08/13/2011  Smokeless Tobacco  . Never Used     Counseling given: Not Answered   Past Medical History:  Diagnosis Date  . Anemia   . Cancer ([place])    [place]  . Helicobacter pylori ab+   . Hyperlipidemia   . Hypertension   . Psoriasis    Past Surgical History:  Procedure Laterality Date  . NO PAST SURGERIES     Family History  Problem Relation Age of Onset  . Heart attack Father   . Heart disease Brother   . Hyperlipidemia Brother   . Alcohol abuse Brother    History  Sexual Activity  . Sexual activity: Not on file    Outpatient Encounter Prescriptions as of 05/02/2017  Medication Sig  . albuterol (PROAIR HFA) 108 (90 BASE) MCG/ACT inhaler Inhale into the lungs.  Marland Kitchen aspirin 81 MG tablet Take 81 mg by mouth daily.   Marland Kitchen atorvastatin (LIPITOR) 40 MG tablet TAKE ONE TABLET  EACH EVENING  . BREO ELLIPTA 200-25 MCG/INH AEPB Take 1 puff by mouth daily.  . carvedilol (COREG) 12.5 MG tablet TAKE ONE TABLET EVERY TWELVE HOURS  . hydrochlorothiazide (HYDRODIURIL) 25 MG tablet TAKE 1 TABLET EVERY DAY  . methotrexate (RHEUMATREX) 2.5 MG tablet Take 10 mg by mouth once a week.   . OXYGEN Inhale 2 L/hr into the lungs.   . SPIRIVA HANDIHALER 18 MCG inhalation capsule TAKE CONTENTS OF ONE CAPSULE VIA HANDIHALER ONCE DAILY  . colchicine 0.6 MG tablet Take 1 tablet (0.6 mg total) by mouth 2 (two) times daily.  (Patient not taking: Reported on 05/02/2017)  . indomethacin (INDOCIN) 50 MG capsule Take 1 capsule (50 mg total) by mouth 2 (two) times daily as needed. (Patient not taking: Reported on 05/02/2017)  . mirtazapine (REMERON) 30 MG tablet Take 1 tablet (30 mg total) by mouth at bedtime. Take 30 minutes to 1 hours before bed. (Patient not taking: Reported on 05/02/2017)  . pantoprazole (PROTONIX) 40 MG tablet TAKE 1 TABLET EVERY DAY (Patient not taking: Reported on 05/02/2017)  . [DISCONTINUED] ADALIMUMAB Inject into the skin.   No facility-administered encounter medications on file as of 05/02/2017.     Activities of Daily Living In your present state of health, do you have any difficulty performing the following activities: 05/02/2017  Hearing? Y  Comment pt sees an audiologist  Vision? N  Difficulty concentrating or making decisions? N  Walking or climbing stairs? Y  Comment COPD  Dressing or bathing? N  Doing errands, shopping? N  Preparing Food and eating ? N  Using the Toilet? N  In the past six months, have you accidently leaked urine? N  Do you have problems with loss of bowel control? N  Managing your Medications? N  Managing your Finances? N  Housekeeping or managing your Housekeeping?  N  Some recent data might be hidden    Patient Care Team: Jerrol Banana., MD as PCP - General (Family Medicine) Bari Mantis, MD as Referring Physician (Internal Medicine) Beverly Gust, MD as Consulting Physician (Otolaryngology) Judi Cong, MD as Physician Assistant (Endocrinology)   Assessment:     Exercise Activities and Dietary recommendations Current Exercise Habits: Home exercise routine, Type of exercise: walking, Time (Minutes): 45, Frequency (Times/Week): 5, Weekly Exercise (Minutes/Week): 225, Intensity: Mild, Exercise limited by: respiratory conditions(s)  Goals    . Increase water intake          Recommend increasing water intake to 4 glasses of water a day.         Fall Risk Fall Risk  05/02/2017 04/30/2016 05/11/2015  Falls in the past year? No No No   Depression Screen PHQ 2/9 Scores 05/02/2017 04/30/2016 09/26/2015 05/11/2015  PHQ - 2 Score 0 0 2 0  PHQ- 9 Score - - 10 -    Cognitive Function     6CIT Screen 05/02/2017  What Year? 0 points  What month? 0 points  What time? 0 points  Count back from 20 0 points  Months in reverse 2 points  Repeat phrase 2 points  Total Score 4    Immunization History  Administered Date(s) Administered  . Influenza, High Dose Seasonal PF 04/30/2016  . Pneumococcal Conjugate-13 07/19/2013, 11/10/2014  . Pneumococcal Polysaccharide-23 04/30/2016  . Tdap 11/05/2011, 03/19/2012  . Zoster 11/05/2011   Screening Tests Health Maintenance  Topic Date Due  . INFLUENZA VACCINE  03/12/2017  . TETANUS/TDAP  03/19/2022  . COLONOSCOPY  11/20/2022  . PNA vac Low Risk Adult  Completed      Plan:  I have personally reviewed and addressed the Medicare Annual Wellness questionnaire and have noted the following in the patient's chart:  A. Medical and social history B. Use of alcohol, tobacco or illicit drugs  C. Current medications and supplements D. Functional ability and status E.  Nutritional status F.  Physical activity G. Advance directives H. List of other physicians I.  Hospitalizations, surgeries, and ER visits in previous 12 months J.  [place] such as hearing and vision if needed, cognitive and depression L. Referrals and appointments - none  In addition, I have reviewed and discussed with patient certain preventive protocols, quality metrics, and best practice recommendations. A written personalized care plan for preventive services as well as general preventive health recommendations were provided to patient.  See attached scanned questionnaire for additional information.   Signed,  Fabio Neighbors, LPN Nurse Health Advisor   MD Recommendations: None. Pt to have the influenza  vaccine at pharmacy this fall.

## 2017-05-03 DIAGNOSIS — G4733 Obstructive sleep apnea (adult) (pediatric): Secondary | ICD-10-CM | POA: Diagnosis not present

## 2017-05-08 DIAGNOSIS — G4733 Obstructive sleep apnea (adult) (pediatric): Secondary | ICD-10-CM | POA: Insufficient documentation

## 2017-05-19 ENCOUNTER — Other Ambulatory Visit: Payer: Self-pay | Admitting: Family Medicine

## 2017-05-28 DIAGNOSIS — J432 Centrilobular emphysema: Secondary | ICD-10-CM | POA: Diagnosis not present

## 2017-05-28 DIAGNOSIS — R911 Solitary pulmonary nodule: Secondary | ICD-10-CM | POA: Diagnosis not present

## 2017-05-30 DIAGNOSIS — D225 Melanocytic nevi of trunk: Secondary | ICD-10-CM | POA: Diagnosis not present

## 2017-05-30 DIAGNOSIS — D2261 Melanocytic nevi of right upper limb, including shoulder: Secondary | ICD-10-CM | POA: Diagnosis not present

## 2017-05-30 DIAGNOSIS — L400 Psoriasis vulgaris: Secondary | ICD-10-CM | POA: Diagnosis not present

## 2017-05-30 DIAGNOSIS — Z85828 Personal history of other malignant neoplasm of skin: Secondary | ICD-10-CM | POA: Diagnosis not present

## 2017-06-02 DIAGNOSIS — Z23 Encounter for immunization: Secondary | ICD-10-CM | POA: Diagnosis not present

## 2017-06-03 DIAGNOSIS — Z5181 Encounter for therapeutic drug level monitoring: Secondary | ICD-10-CM | POA: Diagnosis not present

## 2017-06-03 DIAGNOSIS — L400 Psoriasis vulgaris: Secondary | ICD-10-CM | POA: Diagnosis not present

## 2017-06-04 DIAGNOSIS — Z5181 Encounter for therapeutic drug level monitoring: Secondary | ICD-10-CM | POA: Diagnosis not present

## 2017-06-04 DIAGNOSIS — L400 Psoriasis vulgaris: Secondary | ICD-10-CM | POA: Diagnosis not present

## 2017-06-06 ENCOUNTER — Other Ambulatory Visit: Payer: Self-pay | Admitting: Family Medicine

## 2017-06-25 DIAGNOSIS — H0012 Chalazion right lower eyelid: Secondary | ICD-10-CM | POA: Diagnosis not present

## 2017-07-18 ENCOUNTER — Other Ambulatory Visit: Payer: Self-pay | Admitting: Family Medicine

## 2017-08-26 DIAGNOSIS — J449 Chronic obstructive pulmonary disease, unspecified: Secondary | ICD-10-CM | POA: Diagnosis not present

## 2017-09-12 ENCOUNTER — Encounter: Payer: Self-pay | Admitting: Family Medicine

## 2017-09-12 ENCOUNTER — Ambulatory Visit (INDEPENDENT_AMBULATORY_CARE_PROVIDER_SITE_OTHER): Payer: Medicare Other | Admitting: Family Medicine

## 2017-09-12 VITALS — BP 122/68 | HR 92 | Temp 98.1°F | Wt 209.8 lb

## 2017-09-12 DIAGNOSIS — J449 Chronic obstructive pulmonary disease, unspecified: Secondary | ICD-10-CM | POA: Diagnosis not present

## 2017-09-12 DIAGNOSIS — J069 Acute upper respiratory infection, unspecified: Secondary | ICD-10-CM | POA: Diagnosis not present

## 2017-09-12 MED ORDER — PREDNISONE 10 MG PO TABS: 10.0000 mg | ORAL_TABLET | Freq: Every day | ORAL | 0 refills | Status: DC

## 2017-09-12 NOTE — Progress Notes (Signed)
Patient: Gregory Haynes Male    DOB: 1940/08/31   76 y.o.   MRN: [id number] Visit Date: 09/12/2017  Today's Provider: Vernie Murders, PA   Chief Complaint  Patient presents with  . URI   Subjective:    URI   This is a new problem. Episode onset: 3-4 days ago. The problem has been gradually worsening. There has been no fever. Associated symptoms include congestion and headaches. He has tried nothing for the symptoms.  Patient has a history of COPD, CAFL, and respiratory failure. Patient will start Trelegy soon.   Past Medical History:  Diagnosis Date  . Anemia   . Cancer ([place])    [place]  . Helicobacter pylori ab+   . Hyperlipidemia   . Hypertension   . Psoriasis    Past Surgical History:  Procedure Laterality Date  . NO PAST SURGERIES     Family History  Problem Relation Age of Onset  . Heart attack Father   . Heart disease Brother   . Hyperlipidemia Brother   . Alcohol abuse Brother    No Known Allergies  Current Outpatient Medications:  .  albuterol (PROAIR HFA) 108 (90 BASE) MCG/ACT inhaler, Inhale into the lungs., Disp: , Rfl:  .  aspirin 81 MG tablet, Take 81 mg by mouth daily. , Disp: , Rfl:  .  atorvastatin (LIPITOR) 40 MG tablet, TAKE ONE TABLET  EACH EVENING, Disp: 30 tablet, Rfl: 11 .  BREO ELLIPTA 200-25 MCG/INH AEPB, Take 1 puff by mouth daily., Disp: , Rfl: 10 .  carvedilol (COREG) 12.5 MG tablet, TAKE ONE TABLET EVERY TWELVE HOURS, Disp: 180 tablet, Rfl: 3 .  colchicine 0.6 MG tablet, Take 1 tablet (0.6 mg total) by mouth 2 (two) times daily., Disp: 60 tablet, Rfl: 5 .  Fluticasone-Umeclidin-Vilant (TRELEGY ELLIPTA) 100-62.5-25 MCG/INH AEPB, Inhale 1 puff into the lungs daily., Disp: , Rfl:  .  hydrochlorothiazide (HYDRODIURIL) 25 MG tablet, TAKE 1 TABLET EVERY DAY, Disp: 30 tablet, Rfl: 11 .  indomethacin (INDOCIN) 50 MG capsule, Take 1 capsule (50 mg total) by mouth 2 (two) times daily as needed., Disp: 60 capsule, Rfl: 0 .  methotrexate  (RHEUMATREX) 2.5 MG tablet, Take 10 mg by mouth once a week. , Disp: , Rfl: 2 .  OXYGEN, Inhale 2 L/hr into the lungs. , Disp: , Rfl:  .  pantoprazole (PROTONIX) 40 MG tablet, TAKE 1 TABLET EVERY DAY, Disp: 30 tablet, Rfl: 12 .  SPIRIVA HANDIHALER 18 MCG inhalation capsule, TAKE CONTENTS OF ONE CAPSULE VIA HANDIHALER ONCE DAILY, Disp: 30 capsule, Rfl: 11  Review of Systems  Constitutional: Negative.   HENT: Positive for congestion.   Cardiovascular: Negative.   Neurological: Positive for headaches.   Social History   Tobacco Use  . Smoking status: Former Smoker    Packs/day: 1.50    Years: 55.00    Pack years: 82.50    Types: Cigarettes    Last attempt to quit: 08/13/2011    Years since quitting: 6.0  . Smokeless tobacco: Never Used  Substance Use Topics  . Alcohol use: Yes    Comment: social /  couple drinks a month   Objective:   BP 122/68 (BP Location: Right Arm, Patient Position: Sitting, Cuff Size: Normal)   Pulse 92   Temp 98.1 F (36.7 C) (Oral)   Wt 209 lb 12.8 oz (95.2 kg)   SpO2 91%   BMI 28.45 kg/m   Physical Exam  Constitutional: He is oriented  to person, place, and time. He appears well-developed and well-nourished. No distress.  HENT:  Head: Normocephalic and atraumatic.  Right Ear: Hearing and external ear normal.  Left Ear: Hearing and external ear normal.  Nose: Nose normal.  Mouth/Throat: Oropharynx is clear and moist.  Eyes: Conjunctivae and lids are normal. Right eye exhibits no discharge. Left eye exhibits no discharge. No scleral icterus.  Neck: Neck supple.  Cardiovascular: Normal rate and regular rhythm.  Pulmonary/Chest: Effort normal and breath sounds normal. No respiratory distress.  Slightly distant breath sounds. No wheeze or rales.  Abdominal: Soft.  Musculoskeletal: Normal range of motion.  Lymphadenopathy:    He has no cervical adenopathy.  Neurological: He is alert and oriented to person, place, and time.  Skin: Skin is intact. No  lesion and no rash noted.  Psychiatric: He has a normal mood and affect. His speech is normal and behavior is normal. Thought content normal.    Assessment & Plan:     1. URI with cough and congestion Onset with rough voice and stuffy head. Still on the Azithromycin 250 mg M,W,F (for the past 6 months). No fever, cough or sputum production. May add Mucinex and given prednisone taper. Increase fluid intake and recheck prn. - predniSONE (DELTASONE) 10 MG tablet; Take 1 tablet (10 mg total) by mouth daily with breakfast. Taper by one tablet daily for 6 days (6,5,4,3,2,1)  Dispense: 21 tablet; Refill: 0  2. COPD, moderate (HCC) Continue Breo, Spiriva and Albuterol prn (in the process of changing over to Trelegy). Add prednisone taper and continue Azithromycin as recommended by pulmonologist. Recheck prn. - predniSONE (DELTASONE) 10 MG tablet; Take 1 tablet (10 mg total) by mouth daily with breakfast. Taper by one tablet daily for 6 days (6,5,4,3,2,1)  Dispense: 21 tablet; Refill: [place], [place]  [facility]

## 2017-09-17 DIAGNOSIS — I10 Essential (primary) hypertension: Secondary | ICD-10-CM | POA: Diagnosis not present

## 2017-09-17 DIAGNOSIS — Z9981 Dependence on supplemental oxygen: Secondary | ICD-10-CM | POA: Diagnosis not present

## 2017-09-17 DIAGNOSIS — Z87891 Personal history of nicotine dependence: Secondary | ICD-10-CM | POA: Diagnosis not present

## 2017-09-17 DIAGNOSIS — R0602 Shortness of breath: Secondary | ICD-10-CM | POA: Diagnosis not present

## 2017-09-17 DIAGNOSIS — J449 Chronic obstructive pulmonary disease, unspecified: Secondary | ICD-10-CM | POA: Diagnosis not present

## 2017-09-17 DIAGNOSIS — J189 Pneumonia, unspecified organism: Secondary | ICD-10-CM | POA: Diagnosis not present

## 2017-09-17 DIAGNOSIS — R000 Tachycardia, unspecified: Secondary | ICD-10-CM | POA: Diagnosis not present

## 2017-09-17 DIAGNOSIS — R0600 Dyspnea, unspecified: Secondary | ICD-10-CM | POA: Diagnosis not present

## 2017-09-26 ENCOUNTER — Ambulatory Visit
Admission: RE | Admit: 2017-09-26 | Discharge: 2017-09-26 | Disposition: A | Discharge status: Home / Self Care | Payer: Medicare Other | Source: Ambulatory Visit | Attending: Family Medicine | Admitting: Family Medicine

## 2017-09-26 ENCOUNTER — Encounter: Payer: Self-pay | Admitting: Family Medicine

## 2017-09-26 ENCOUNTER — Ambulatory Visit (INDEPENDENT_AMBULATORY_CARE_PROVIDER_SITE_OTHER): Payer: Medicare Other | Admitting: Family Medicine

## 2017-09-26 VITALS — BP 122/70 | HR 90 | Temp 99.0°F | Wt 203.4 lb

## 2017-09-26 DIAGNOSIS — M25552 Pain in left hip: Secondary | ICD-10-CM

## 2017-09-26 DIAGNOSIS — M419 Scoliosis, unspecified: Secondary | ICD-10-CM | POA: Insufficient documentation

## 2017-09-26 DIAGNOSIS — M5136 Other intervertebral disc degeneration, lumbar region: Secondary | ICD-10-CM | POA: Diagnosis not present

## 2017-09-26 MED ORDER — PREDNISONE 10 MG PO TABS: 10.0000 mg | ORAL_TABLET | Freq: Every day | ORAL | 0 refills | Status: DC

## 2017-09-26 NOTE — Progress Notes (Signed)
Patient: Gregory Haynes Male    DOB: [birth year]   76 y.o.   MRN: [id number] Visit Date: 09/26/2017  Today's Provider: Vernie Murders, PA   Chief Complaint  Patient presents with  . Leg Pain  . Hip Pain   Subjective:    Leg Pain   Incident onset: 10 days. There was no injury mechanism. Pain location: left hip radiating down left leg. The quality of the pain is described as shooting. The pain has been fluctuating since onset. The symptoms are aggravated by weight bearing (lying on side at night). He has tried nothing for the symptoms.   Past Medical History:  Diagnosis Date  . Anemia   . Cancer ([place])    [place]  . Helicobacter pylori ab+   . Hyperlipidemia   . Hypertension   . Psoriasis    Patient Active Problem List   Diagnosis Date Noted  . Obstructive sleep apnea 05/08/2017  . Psoriatic arthritis ([place]) 10/31/2015  . 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 CAFL (chronic airflow limitation) ([place]) 05/10/2015  . 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 Esophagitis, reflux 05/10/2015  . BP (high blood pressure) 05/10/2015  . Positive H. pylori test 05/10/2015  . 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 Psoriasis 05/10/2015  . 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 Respiratory failure with hypercapnia ([place]) 03/07/2012  . Fam hx-ischem heart disease 03/23/2010  . Hypercholesteremia 03/23/2010  . Current tobacco use 03/23/2010   Past Surgical History:  Procedure Laterality Date  . NO PAST SURGERIES     Family History  Problem Relation Age of Onset  . Heart attack Father   . Heart disease Brother   . Hyperlipidemia Brother   . Alcohol abuse Brother    No Known Allergies  Current Outpatient Medications:  .  albuterol (PROAIR HFA) 108 (90 BASE) MCG/ACT inhaler, Inhale into the lungs., Disp: , Rfl:  .  aspirin 81 MG tablet, Take 81 mg by mouth daily. , Disp: , Rfl:  .  atorvastatin (LIPITOR) 40 MG tablet, TAKE ONE TABLET  EACH EVENING, Disp: 30  tablet, Rfl: 11 .  BREO ELLIPTA 200-25 MCG/INH AEPB, Take 1 puff by mouth daily., Disp: , Rfl: 10 .  carvedilol (COREG) 12.5 MG tablet, TAKE ONE TABLET EVERY TWELVE HOURS, Disp: 180 tablet, Rfl: 3 .  colchicine 0.6 MG tablet, Take 1 tablet (0.6 mg total) by mouth 2 (two) times daily., Disp: 60 tablet, Rfl: 5 .  Fluticasone-Umeclidin-Vilant (TRELEGY ELLIPTA) 100-62.5-25 MCG/INH AEPB, Inhale 1 puff into the lungs daily., Disp: , Rfl:  .  hydrochlorothiazide (HYDRODIURIL) 25 MG tablet, TAKE 1 TABLET EVERY DAY, Disp: 30 tablet, Rfl: 11 .  indomethacin (INDOCIN) 50 MG capsule, Take 1 capsule (50 mg total) by mouth 2 (two) times daily as needed., Disp: 60 capsule, Rfl: 0 .  methotrexate (RHEUMATREX) 2.5 MG tablet, Take 10 mg by mouth once a week. , Disp: , Rfl: 2 .  OXYGEN, Inhale 2 L/hr into the lungs. , Disp: , Rfl:  .  pantoprazole (PROTONIX) 40 MG tablet, TAKE 1 TABLET EVERY DAY, Disp: 30 tablet, Rfl: 12 .  SPIRIVA HANDIHALER 18 MCG inhalation capsule, TAKE CONTENTS OF ONE CAPSULE VIA HANDIHALER ONCE DAILY, Disp: 30 capsule, Rfl: 11  Review of Systems  Constitutional: Negative.   Respiratory: Negative.   Cardiovascular: Negative.   Musculoskeletal: Positive for arthralgias.   Social History   Tobacco Use  . Smoking status: Former Smoker    Packs/day:  1.50    Years: 55.00    Pack years: 82.50    Types: Cigarettes    Last attempt to quit: 08/13/2011    Years since quitting: 6.1  . Smokeless tobacco: Never Used  Substance Use Topics  . Alcohol use: Yes    Comment: social /  couple drinks a month   Objective:   BP 122/70 (BP Location: Right Arm, Patient Position: Sitting, Cuff Size: Normal)   Pulse 90   Temp 99 F (37.2 C) (Oral)   Wt 203 lb 6.4 oz (92.3 kg)   SpO2 97%   BMI 27.59 kg/m   Physical Exam  Constitutional: He is oriented to person, place, and time. He appears well-developed and well-nourished. No distress.  HENT:  Head: Normocephalic and atraumatic.  Right Ear:  Hearing normal.  Left Ear: Hearing normal.  Nose: Nose normal.  Eyes: Conjunctivae and lids are normal. Right eye exhibits no discharge. Left eye exhibits no discharge. No scleral icterus.  Neck: Neck supple.  Cardiovascular: Normal rate and regular rhythm.  Pulmonary/Chest: Effort normal. No respiratory distress.  Congested cough with history of COPD with hypoxia.  Musculoskeletal: Normal range of motion. He exhibits tenderness.  Tenderness over the left trochanter to palpate. Good ROM throughout.  Neurological: He is alert and oriented to person, place, and time.  Skin: Skin is intact. No lesion and no rash noted.  Psychiatric: He has a normal mood and affect. His speech is normal and behavior is normal. Thought content normal.      Assessment & Plan:     1. Left hip pain Onset over the past 10 days. No known injury. Started after sleeping in a smaller bed. Pain worse with lying on the left side. Suspect trochanteric bursitis. Treat with prednisone and get x-rays. Recheck prn. Apply moist heat or Aspercreme with Lidocaine prn. - DG Lumbar Spine Complete - DG HIP UNILAT WITH PELVIS 2-3 VIEWS LEFT - predniSONE (DELTASONE) 10 MG tablet; Take 1 tablet (10 mg total) by mouth daily with breakfast. Taper down by one tablet daily (6,5,4,3,2,1)  Dispense: 21 tablet; Refill: [place], PA  [facility]

## 2017-09-29 ENCOUNTER — Telehealth: Payer: Self-pay

## 2017-09-29 DIAGNOSIS — K8689 Other specified diseases of pancreas: Secondary | ICD-10-CM

## 2017-09-29 NOTE — Telephone Encounter (Signed)
Patient and wife has been advised, order for CT has been placed in chart. KW

## 2017-09-29 NOTE — Telephone Encounter (Signed)
-----   Message from Margo Common, [place] sent at 09/29/2017  9:06 AM EST ----- Normal hip x-ray. Multilevel degenerative changes with normal alignment. L2-L3 disk space narrowed with many lumbar spurs. Also, multiple calcifications seen in the upper abdomen consistent with chronic pancreatitis. Recommend CT scan of abdomen with and without contrast for pancreatic calcifications.

## 2017-10-09 ENCOUNTER — Ambulatory Visit
Admission: RE | Admit: 2017-10-09 | Discharge: 2017-10-09 | Disposition: A | Discharge status: Home / Self Care | Payer: Medicare Other | Source: Ambulatory Visit | Attending: Family Medicine | Admitting: Family Medicine

## 2017-10-09 DIAGNOSIS — K8689 Other specified diseases of pancreas: Secondary | ICD-10-CM | POA: Insufficient documentation

## 2017-10-09 DIAGNOSIS — I700 Atherosclerosis of aorta: Secondary | ICD-10-CM | POA: Insufficient documentation

## 2017-10-09 DIAGNOSIS — K5730 Diverticulosis of large intestine without perforation or abscess without bleeding: Secondary | ICD-10-CM | POA: Diagnosis not present

## 2017-10-09 LAB — POCT I-STAT CREATININE: CREATININE: 1 mg/dL (ref 0.61–1.24)

## 2017-10-09 MED ORDER — IOPAMIDOL (ISOVUE-300) INJECTION 61%
100.0000 mL | Freq: Once | INTRAVENOUS | Status: AC | PRN
  Administered 2017-10-09: 100 mL via INTRAVENOUS

## 2017-10-20 ENCOUNTER — Encounter: Payer: Self-pay | Admitting: Family Medicine

## 2017-10-20 ENCOUNTER — Ambulatory Visit (INDEPENDENT_AMBULATORY_CARE_PROVIDER_SITE_OTHER): Payer: Medicare Other | Admitting: Family Medicine

## 2017-10-20 VITALS — BP 120/68 | HR 74 | Temp 97.9°F | Resp 16

## 2017-10-20 DIAGNOSIS — J449 Chronic obstructive pulmonary disease, unspecified: Secondary | ICD-10-CM | POA: Diagnosis not present

## 2017-10-20 DIAGNOSIS — G4733 Obstructive sleep apnea (adult) (pediatric): Secondary | ICD-10-CM | POA: Diagnosis not present

## 2017-10-20 DIAGNOSIS — I10 Essential (primary) hypertension: Secondary | ICD-10-CM | POA: Diagnosis not present

## 2017-10-20 DIAGNOSIS — E7800 Pure hypercholesterolemia, unspecified: Secondary | ICD-10-CM | POA: Diagnosis not present

## 2017-10-20 DIAGNOSIS — K8689 Other specified diseases of pancreas: Secondary | ICD-10-CM | POA: Diagnosis not present

## 2017-10-20 NOTE — Progress Notes (Signed)
Patient: Gregory Haynes Male    DOB: 09/21/1940   77 y.o.   MRN: [id number] Visit Date: 10/20/2017  Today's Provider: Wilhemena Durie, MD   Chief Complaint  Patient presents with  . Follow-up    from Ct scan   Subjective:    HPI Pt is here today to discuss his CT scan. CT showed  Multiple small calcifications within the pancreas compatible with chronic calcific pancreatitis. This accounts for the recent radiographic abnormality. No evidence of acute pancreatitis. Aortic Atherosclerosis.     No Known Allergies   Current Outpatient Medications:  .  aspirin 81 MG tablet, Take 81 mg by mouth daily. , Disp: , Rfl:  .  atorvastatin (LIPITOR) 40 MG tablet, TAKE ONE TABLET  EACH EVENING, Disp: 30 tablet, Rfl: 11 .  carvedilol (COREG) 12.5 MG tablet, TAKE ONE TABLET EVERY TWELVE HOURS, Disp: 180 tablet, Rfl: 3 .  Fluticasone-Umeclidin-Vilant (TRELEGY ELLIPTA) 100-62.5-25 MCG/INH AEPB, Inhale 1 puff into the lungs daily., Disp: , Rfl:  .  methotrexate (RHEUMATREX) 2.5 MG tablet, Take 10 mg by mouth once a week. , Disp: , Rfl: 2 .  OXYGEN, Inhale 2 L/hr into the lungs. , Disp: , Rfl:  .  albuterol (PROAIR HFA) 108 (90 BASE) MCG/ACT inhaler, Inhale into the lungs., Disp: , Rfl:  .  BREO ELLIPTA 200-25 MCG/INH AEPB, Take 1 puff by mouth daily., Disp: , Rfl: 10 .  colchicine 0.6 MG tablet, Take 1 tablet (0.6 mg total) by mouth 2 (two) times daily. (Patient not taking: Reported on 10/20/2017), Disp: 60 tablet, Rfl: 5 .  hydrochlorothiazide (HYDRODIURIL) 25 MG tablet, TAKE 1 TABLET EVERY DAY (Patient not taking: Reported on 10/20/2017), Disp: 30 tablet, Rfl: 11 .  indomethacin (INDOCIN) 50 MG capsule, Take 1 capsule (50 mg total) by mouth 2 (two) times daily as needed. (Patient not taking: Reported on 10/20/2017), Disp: 60 capsule, Rfl: 0 .  pantoprazole (PROTONIX) 40 MG tablet, TAKE 1 TABLET EVERY DAY (Patient not taking: Reported on 10/20/2017), Disp: 30 tablet, Rfl: 12 .  predniSONE  (DELTASONE) 10 MG tablet, Take 1 tablet (10 mg total) by mouth daily with breakfast. Taper down by one tablet daily (6,5,4,3,2,1) (Patient not taking: Reported on 10/20/2017), Disp: 21 tablet, Rfl: 0 .  SPIRIVA HANDIHALER 18 MCG inhalation capsule, TAKE CONTENTS OF ONE CAPSULE VIA HANDIHALER ONCE DAILY (Patient not taking: Reported on 10/20/2017), Disp: 30 capsule, Rfl: 11  Review of Systems  Constitutional: Negative.   HENT: Negative.   Eyes: Negative.   Respiratory: Negative.   Cardiovascular: Negative.   Gastrointestinal: Negative.   Endocrine: Negative.   Genitourinary: Negative.   Musculoskeletal: Negative.   Skin: Negative.   Allergic/Immunologic: Negative.   Neurological: Negative.   Hematological: Negative.   Psychiatric/Behavioral: Negative.     Social History   Tobacco Use  . Smoking status: Former Smoker    Packs/day: 1.50    Years: 55.00    Pack years: 82.50    Types: Cigarettes    Last attempt to quit: 08/13/2011    Years since quitting: 6.1  . Smokeless tobacco: Never Used  Substance Use Topics  . Alcohol use: Yes    Comment: social /  couple drinks a month   Objective:   BP 120/68 (BP Location: Left Arm, Patient Position: Sitting, Cuff Size: Normal)   Pulse 74   Temp 97.9 F (36.6 C) (Oral)   Resp 16   SpO2 95%  Vitals:   10/20/17 1054  BP: 120/68  Pulse: 74  Resp: 16  Temp: 97.9 F (36.6 C)  TempSrc: Oral  SpO2: 95%     Physical Exam  Constitutional: He is oriented to person, place, and time. He appears well-developed and well-nourished.  HENT:  Head: Normocephalic and atraumatic.  Eyes: Conjunctivae are normal. No scleral icterus.  Neck: No thyromegaly present.  Cardiovascular: Normal rate, regular rhythm and normal heart sounds.  Pulmonary/Chest: Effort normal and breath sounds normal.  Abdominal: Soft.  Neurological: He is alert and oriented to person, place, and time.  Skin: Skin is warm and dry.  Psychiatric: He has a normal mood and  affect. His behavior is normal. Judgment and thought content normal.        Assessment & Plan:     1. Essential hypertension   2. COPD, moderate (HCC)/severe   3. Obstructive sleep apnea   4. Hypercholesteremia   5. Pancreatic calcification Benign per Dr Tiffany Kocher in past years. Pt rare drinker of alcohol. CPE this summer.     I have done the exam and reviewed the above chart and it is accurate to the best of my knowledge. Development worker, community has been used in this note in any air is in the dictation or transcription are unintentional.  Wilhemena Durie, MD  [place].

## 2017-10-20 NOTE — Patient Instructions (Signed)
Generic Mucinex daily.

## 2018-01-15 DIAGNOSIS — E041 Nontoxic single thyroid nodule: Secondary | ICD-10-CM | POA: Diagnosis not present

## 2018-01-19 DIAGNOSIS — E042 Nontoxic multinodular goiter: Secondary | ICD-10-CM | POA: Diagnosis not present

## 2018-02-24 DIAGNOSIS — J9611 Chronic respiratory failure with hypoxia: Secondary | ICD-10-CM | POA: Diagnosis not present

## 2018-02-24 DIAGNOSIS — J449 Chronic obstructive pulmonary disease, unspecified: Secondary | ICD-10-CM | POA: Diagnosis not present

## 2018-03-24 ENCOUNTER — Other Ambulatory Visit: Payer: Self-pay | Admitting: Family Medicine

## 2018-03-24 ENCOUNTER — Encounter: Payer: Self-pay | Admitting: Family Medicine

## 2018-03-31 ENCOUNTER — Other Ambulatory Visit: Payer: Self-pay | Admitting: Family Medicine

## 2018-05-05 DIAGNOSIS — Z23 Encounter for immunization: Secondary | ICD-10-CM | POA: Diagnosis not present

## 2018-05-19 DIAGNOSIS — Z08 Encounter for follow-up examination after completed treatment for malignant neoplasm: Secondary | ICD-10-CM | POA: Diagnosis not present

## 2018-05-19 DIAGNOSIS — Z85828 Personal history of other malignant neoplasm of skin: Secondary | ICD-10-CM | POA: Diagnosis not present

## 2018-05-19 DIAGNOSIS — D2261 Melanocytic nevi of right upper limb, including shoulder: Secondary | ICD-10-CM | POA: Diagnosis not present

## 2018-05-19 DIAGNOSIS — Z5181 Encounter for therapeutic drug level monitoring: Secondary | ICD-10-CM | POA: Diagnosis not present

## 2018-05-19 DIAGNOSIS — L570 Actinic keratosis: Secondary | ICD-10-CM | POA: Diagnosis not present

## 2018-05-19 DIAGNOSIS — L400 Psoriasis vulgaris: Secondary | ICD-10-CM | POA: Diagnosis not present

## 2018-05-19 DIAGNOSIS — D225 Melanocytic nevi of trunk: Secondary | ICD-10-CM | POA: Diagnosis not present

## 2018-05-19 DIAGNOSIS — Z79899 Other long term (current) drug therapy: Secondary | ICD-10-CM | POA: Diagnosis not present

## 2018-05-19 DIAGNOSIS — D2262 Melanocytic nevi of left upper limb, including shoulder: Secondary | ICD-10-CM | POA: Diagnosis not present

## 2018-06-08 DIAGNOSIS — I2510 Atherosclerotic heart disease of native coronary artery without angina pectoris: Secondary | ICD-10-CM | POA: Diagnosis not present

## 2018-06-08 DIAGNOSIS — R0609 Other forms of dyspnea: Secondary | ICD-10-CM | POA: Diagnosis not present

## 2018-06-08 DIAGNOSIS — J9611 Chronic respiratory failure with hypoxia: Secondary | ICD-10-CM | POA: Diagnosis not present

## 2018-06-08 DIAGNOSIS — J441 Chronic obstructive pulmonary disease with (acute) exacerbation: Secondary | ICD-10-CM | POA: Diagnosis not present

## 2018-06-08 DIAGNOSIS — J449 Chronic obstructive pulmonary disease, unspecified: Secondary | ICD-10-CM | POA: Diagnosis not present

## 2018-06-22 ENCOUNTER — Other Ambulatory Visit: Payer: Self-pay | Admitting: Family Medicine

## 2018-06-23 DIAGNOSIS — R0609 Other forms of dyspnea: Secondary | ICD-10-CM | POA: Insufficient documentation

## 2018-06-23 DIAGNOSIS — I25118 Atherosclerotic heart disease of native coronary artery with other forms of angina pectoris: Secondary | ICD-10-CM | POA: Diagnosis not present

## 2018-06-23 DIAGNOSIS — I10 Essential (primary) hypertension: Secondary | ICD-10-CM | POA: Diagnosis not present

## 2018-06-23 DIAGNOSIS — R0600 Dyspnea, unspecified: Secondary | ICD-10-CM | POA: Insufficient documentation

## 2018-06-23 DIAGNOSIS — R0602 Shortness of breath: Secondary | ICD-10-CM | POA: Diagnosis not present

## 2018-06-24 ENCOUNTER — Ambulatory Visit (INDEPENDENT_AMBULATORY_CARE_PROVIDER_SITE_OTHER): Payer: Medicare Other

## 2018-06-24 ENCOUNTER — Ambulatory Visit (INDEPENDENT_AMBULATORY_CARE_PROVIDER_SITE_OTHER): Payer: Medicare Other | Admitting: Family Medicine

## 2018-06-24 VITALS — BP 116/62 | HR 70 | Temp 98.5°F | Ht 72.0 in | Wt 201.2 lb

## 2018-06-24 VITALS — BP 116/62 | HR 70 | Temp 98.5°F | Ht 72.0 in | Wt 201.0 lb

## 2018-06-24 DIAGNOSIS — Z0000 Encounter for general adult medical examination without abnormal findings: Secondary | ICD-10-CM

## 2018-06-24 DIAGNOSIS — E7800 Pure hypercholesterolemia, unspecified: Secondary | ICD-10-CM | POA: Diagnosis not present

## 2018-06-24 DIAGNOSIS — J449 Chronic obstructive pulmonary disease, unspecified: Secondary | ICD-10-CM

## 2018-06-24 DIAGNOSIS — I10 Essential (primary) hypertension: Secondary | ICD-10-CM | POA: Diagnosis not present

## 2018-06-24 DIAGNOSIS — D649 Anemia, unspecified: Secondary | ICD-10-CM

## 2018-06-24 NOTE — Progress Notes (Signed)
Subjective:   Gregory Haynes is a 77 y.o. male who presents for Medicare Annual/Subsequent preventive examination.  Review of Systems:  N/A  Cardiac Risk Factors include: advanced age (>50men, >13 women);dyslipidemia;hypertension;male gender     Objective:    Vitals: BP 116/62 (BP Location: Right Arm)   Pulse 70   Temp 98.5 F (36.9 C) (Oral)   Ht 6' (1.829 m)   Wt 201 lb 3.2 oz (91.3 kg)   BMI 27.29 kg/m   Body mass index is 27.29 kg/m.  Advanced Directives 06/24/2018 05/02/2017  Does Patient Have a Medical Advance Directive? Yes Yes  Type of Paramedic of [place];Living will [place];Living will  Copy of [place] in Chart? No - copy requested No - copy requested    Tobacco Social History   Tobacco Use  Smoking Status Former Smoker  . Packs/day: 1.50  . Years: 55.00  . Pack years: 82.50  . Types: Cigarettes  . Last attempt to quit: 08/13/2011  . Years since quitting: 6.8  Smokeless Tobacco Never Used     Counseling given: Not Answered   Clinical Intake:  Pre-visit preparation completed: Yes  Pain : No/denies pain Pain Score: 0-No pain     Nutritional Status: BMI 25 -29 Overweight Nutritional Risks: None Diabetes: No  How often do you need to have someone help you when you read instructions, pamphlets, or other written materials from your doctor or pharmacy?: 1 - Never  Interpreter Needed?: No  Information entered by :: [facility], LPN  Past Medical History:  Diagnosis Date  . Anemia   . Cancer ([place])    [place]  . Helicobacter pylori ab+   . Hyperlipidemia   . Hypertension   . Psoriasis    Past Surgical History:  Procedure Laterality Date  . NO PAST SURGERIES     Family History  Problem Relation Age of Onset  . Heart attack Father   . Heart disease Brother   . Hyperlipidemia Brother   . Alcohol abuse Brother    Social History   Socioeconomic History  . Marital status:  Married    Spouse name: Patsy  . Number of children: 2  . Years of education: Not on file  . Highest education level: Some college, no degree  Occupational History  . Occupation: retired  Scientific laboratory technician  . Financial resource strain: Not hard at all  . Food insecurity:    Worry: Never true    Inability: Never true  . Transportation needs:    Medical: No    Non-medical: No  Tobacco Use  . Smoking status: Former Smoker    Packs/day: 1.50    Years: 55.00    Pack years: 82.50    Types: Cigarettes    Last attempt to quit: 08/13/2011    Years since quitting: 6.8  . Smokeless tobacco: Never Used  Substance and Sexual Activity  . Alcohol use: Yes    Comment: social /  couple drinks a month  . Drug use: No  . Sexual activity: Not on file  Lifestyle  . Physical activity:    Days per week: 6 days    Minutes per session: 40 min  . Stress: Only a little  Relationships  . Social connections:    Talks on phone: Patient refused    Gets together: Patient refused    Attends religious service: Patient refused    Active member of club or organization: Patient refused  Attends meetings of clubs or organizations: Patient refused    Relationship status: Patient refused  Other Topics Concern  . Not on file  Social History Narrative  . Not on file    Outpatient Encounter Medications as of 06/24/2018  Medication Sig  . aspirin 81 MG tablet Take 81 mg by mouth daily.   Marland Kitchen atorvastatin (LIPITOR) 40 MG tablet TAKE ONE TABLET  EACH EVENING  . carvedilol (COREG) 12.5 MG tablet TAKE 1 TABLET BY MOUTH EVERY 12 HOURS  . Fluticasone-Umeclidin-Vilant (TRELEGY ELLIPTA) 100-62.5-25 MCG/INH AEPB Inhale 1 puff into the lungs daily.  . hydrochlorothiazide (HYDRODIURIL) 25 MG tablet TAKE ONE TABLET BY MOUTH EVERY DAY  . methotrexate (RHEUMATREX) 2.5 MG tablet Take 10 mg by mouth once a week.   . OXYGEN Inhale 2 L/hr into the lungs.   Marland Kitchen albuterol (PROAIR HFA) 108 (90 BASE) MCG/ACT inhaler Inhale into the  lungs.  Marland Kitchen BREO ELLIPTA 200-25 MCG/INH AEPB Take 1 puff by mouth daily.  . colchicine 0.6 MG tablet Take 1 tablet (0.6 mg total) by mouth 2 (two) times daily. (Patient not taking: Reported on 10/20/2017)  . indomethacin (INDOCIN) 50 MG capsule Take 1 capsule (50 mg total) by mouth 2 (two) times daily as needed. (Patient not taking: Reported on 10/20/2017)  . pantoprazole (PROTONIX) 40 MG tablet TAKE 1 TABLET DAILY (Patient not taking: Reported on 06/24/2018)  . predniSONE (DELTASONE) 10 MG tablet Take 1 tablet (10 mg total) by mouth daily with breakfast. Taper down by one tablet daily (6,5,4,3,2,1) (Patient not taking: Reported on 10/20/2017)  . SPIRIVA HANDIHALER 18 MCG inhalation capsule TAKE CONTENTS OF ONE CAPSULE VIA HANDIHALER ONCE DAILY (Patient not taking: Reported on 10/20/2017)   No facility-administered encounter medications on file as of 06/24/2018.     Activities of Daily Living In your present state of health, do you have any difficulty performing the following activities: 06/24/2018  Hearing? Y  Comment Wears hearing aids.   Vision? N  Comment Wear eye glasses.  Difficulty concentrating or making decisions? N  Walking or climbing stairs? Y  Comment Due to SOB.  Dressing or bathing? N  Doing errands, shopping? N  Preparing Food and eating ? N  Using the Toilet? N  In the past six months, have you accidently leaked urine? N  Do you have problems with loss of bowel control? N  Managing your Medications? N  Managing your Finances? N  Housekeeping or managing your Housekeeping? N  Some recent data might be hidden    Patient Care Team: Jerrol Banana., MD as PCP - General (Family Medicine) Bari Mantis, MD as Referring Physician (Internal Medicine) Beverly Gust, MD as Consulting Physician (Otolaryngology) Tilda Franco, MD as Referring Physician (Cardiology) Dasher, Rayvon Char, MD (Dermatology)   Assessment:   This is a routine wellness examination for  Gregory Haynes.  Exercise Activities and Dietary recommendations Current Exercise Habits: Home exercise routine, Time (Minutes): 45, Frequency (Times/Week): 6, Weekly Exercise (Minutes/Week): 270, Intensity: Mild, Exercise limited by: respiratory conditions(s)  Goals    . Increase water intake     Recommend increasing water intake to 4 glasses of water a day.    06/24/18- Continue to cut back on sweet tea and increase water intake to 4-6 glasses a day.        Fall Risk Fall Risk  06/24/2018 05/02/2017 04/30/2016 05/11/2015  Falls in the past year? 0 No No No   FALL RISK PREVENTION PERTAINING TO THE HOME:  Any stairs in  or around the home WITH handrails? No  Home free of loose throw rugs in walkways, pet beds, electrical cords, etc? Yes  Adequate lighting in your home to reduce risk of falls? Yes   ASSISTIVE DEVICES UTILIZED TO PREVENT FALLS:  Life alert? No  Use of a cane, walker or w/c? No  Grab bars in the bathroom? No  Shower chair or bench in shower? No  Elevated toilet seat or a handicapped toilet? No    TIMED UP AND GO:  Was the test performed? No .     Depression Screen PHQ 2/9 Scores 06/24/2018 05/02/2017 04/30/2016 09/26/2015  PHQ - 2 Score 1 0 0 2  PHQ- 9 Score - - - 10    Cognitive Function:      6CIT Screen 06/24/2018 05/02/2017  What Year? 0 points 0 points  What month? 0 points 0 points  What time? 0 points 0 points  Count back from 20 0 points 0 points  Months in reverse 0 points 2 points  Repeat phrase 0 points 2 points  Total Score 0 4    Immunization History  Administered Date(s) Administered  . Influenza, High Dose Seasonal PF 04/30/2016, 05/05/2018  . Influenza-Unspecified 06/12/2017  . Pneumococcal Conjugate-13 07/19/2013, 11/10/2014  . Pneumococcal Polysaccharide-23 04/30/2016  . Tdap 11/05/2011, 03/19/2012  . Zoster 11/05/2011    Qualifies for Shingles Vaccine? Yes  Zostavax completed 11/05/11. Due for Shingrix. Education has been provided  regarding the importance of this vaccine. Pt has been advised to call insurance company to determine out of pocket expense. Advised may also receive vaccine at local pharmacy or Health Dept. Verbalized acceptance and understanding.  Tdap: Up to date  Flu Vaccine: Up to date  Pneumococcal Vaccine: Up to date   Screening Tests Health Maintenance  Topic Date Due  . TETANUS/TDAP  03/19/2022  . INFLUENZA VACCINE  Completed  . PNA vac Low Risk Adult  Completed   Cancer Screenings:  Colorectal Screening: Completed 11/19/12. No longer required.   Lung Cancer Screening: (Low Dose CT Chest recommended if Age 22-80 years, 30 pack-year currently smoking OR have quit w/in 15years.) does qualify, however declined order today.    Additional Screening:  Vision Screening: Recommended annual ophthalmology exams for early detection of glaucoma and other disorders of the eye.  Dental Screening: Recommended annual dental exams for proper oral hygiene  Community Resource Referral:  CRR required this visit?  No        Plan:  I have personally reviewed and addressed the Medicare Annual Wellness questionnaire and have noted the following in the patient's chart:  A. Medical and social history B. Use of alcohol, tobacco or illicit drugs  C. Current medications and supplements D. Functional ability and status E.  Nutritional status F.  Physical activity G. Advance directives H. List of other physicians I.  Hospitalizations, surgeries, and ER visits in previous 12 months J.  [place] such as hearing and vision if needed, cognitive and depression L. Referrals and appointments - none  In addition, I have reviewed and discussed with patient certain preventive protocols, quality metrics, and best practice recommendations. A written personalized care plan for preventive services as well as general preventive health recommendations were provided to patient.  See attached scanned  questionnaire for additional information.   Signed,  Fabio Neighbors, LPN Nurse Health Advisor   Nurse Recommendations: None.

## 2018-06-24 NOTE — Patient Instructions (Addendum)
Mr. Gregory Haynes , Thank you for taking time to come for your Medicare Wellness Visit. I appreciate your ongoing commitment to your health goals. Please review the following plan we discussed and let me know if I can assist you in the future.   Screening recommendations/referrals: Colonoscopy: Up to date, no longer required.  Recommended yearly ophthalmology/optometry visit for glaucoma screening and checkup Recommended yearly dental visit for hygiene and checkup  Vaccinations: Influenza vaccine: Up to date Pneumococcal vaccine: Completed series Tdap vaccine: Up to date, due 03/2022 Shingles vaccine: Pt declines today.     Advanced directives: Please bring a copy of your POA (Power of Attorney) and/or Living Will to your next appointment.   Conditions/risks identified: Continue to cut back on sweet tea and increase water intake to 4-6 glasses a day.   Next appointment: 2:00 PM today with Dr Rosanna Randy.  Preventive Care 29 Years and Older, Male Preventive care refers to lifestyle choices and visits with your health care provider that can promote health and wellness. What does preventive care include?  A yearly physical exam. This is also called an annual well check.  Dental exams once or twice a year.  Routine eye exams. Ask your health care provider how often you should have your eyes checked.  Personal lifestyle choices, including:  Daily care of your teeth and gums.  Regular physical activity.  Eating a healthy diet.  Avoiding tobacco and drug use.  Limiting alcohol use.  Practicing safe sex.  Taking low doses of aspirin every day.  Taking vitamin and mineral supplements as recommended by your health care provider. What happens during an annual well check? The services and screenings done by your health care provider during your annual well check will depend on your age, overall health, lifestyle risk factors, and family history of disease. Counseling  Your health care  provider may ask you questions about your:  Alcohol use.  Tobacco use.  Drug use.  Emotional well-being.  Home and relationship well-being.  Sexual activity.  Eating habits.  History of falls.  Memory and ability to understand (cognition).  Work and work Statistician. Screening  You may have the following tests or measurements:  Height, weight, and BMI.  Blood pressure.  Lipid and cholesterol levels. These may be checked every 5 years, or more frequently if you are over 44 years old.  Skin check.  Lung cancer screening. You may have this screening every year starting at age 83 if you have a 30-pack-year history of smoking and currently smoke or have quit within the past 15 years.  Fecal occult blood test (FOBT) of the stool. You may have this test every year starting at age 46.  Flexible sigmoidoscopy or colonoscopy. You may have a sigmoidoscopy every 5 years or a colonoscopy every 10 years starting at age 53.  Prostate cancer screening. Recommendations will vary depending on your family history and other risks.  Hepatitis C blood test.  Hepatitis B blood test.  Sexually transmitted disease (STD) testing.  Diabetes screening. This is done by checking your blood sugar (glucose) after you have not eaten for a while (fasting). You may have this done every 1-3 years.  Abdominal aortic aneurysm (AAA) screening. You may need this if you are a current or former smoker.  Osteoporosis. You may be screened starting at age 27 if you are at high risk. Talk with your health care provider about your test results, treatment options, and if necessary, the need for more tests. Vaccines  Your health care provider may recommend certain vaccines, such as:  Influenza vaccine. This is recommended every year.  Tetanus, diphtheria, and acellular pertussis (Tdap, Td) vaccine. You may need a Td booster every 10 years.  Zoster vaccine. You may need this after age 28.  Pneumococcal  13-valent conjugate (PCV13) vaccine. One dose is recommended after age 89.  Pneumococcal polysaccharide (PPSV23) vaccine. One dose is recommended after age 41. Talk to your health care provider about which screenings and vaccines you need and how often you need them. This information is not intended to replace advice given to you by your health care provider. Make sure you discuss any questions you have with your health care provider. Document Released: 08/25/2015 Document Revised: 04/17/2016 Document Reviewed: 05/30/2015 Elsevier Interactive Patient Education  2017 [employer] Prevention in the Home Falls can cause injuries. They can happen to people of all ages. There are many things you can do to make your home safe and to help prevent falls. What can I do on the outside of my home?  Regularly fix the edges of walkways and driveways and fix any cracks.  Remove anything that might make you trip as you walk through a door, such as a raised step or threshold.  Trim any bushes or trees on the path to your home.  Use bright outdoor lighting.  Clear any walking paths of anything that might make someone trip, such as rocks or tools.  Regularly check to see if handrails are loose or broken. Make sure that both sides of any steps have handrails.  Any raised decks and porches should have guardrails on the edges.  Have any leaves, snow, or ice cleared regularly.  Use sand or salt on walking paths during winter.  Clean up any spills in your garage right away. This includes oil or grease spills. What can I do in the bathroom?  Use night lights.  Install grab bars by the toilet and in the tub and shower. Do not use towel bars as grab bars.  Use non-skid mats or decals in the tub or shower.  If you need to sit down in the shower, use a plastic, non-slip stool.  Keep the floor dry. Clean up any water that spills on the floor as soon as it happens.  Remove soap buildup in the  tub or shower regularly.  Attach bath mats securely with double-sided non-slip rug tape.  Do not have throw rugs and other things on the floor that can make you trip. What can I do in the bedroom?  Use night lights.  Make sure that you have a light by your bed that is easy to reach.  Do not use any sheets or blankets that are too big for your bed. They should not hang down onto the floor.  Have a firm chair that has side arms. You can use this for support while you get dressed.  Do not have throw rugs and other things on the floor that can make you trip. What can I do in the kitchen?  Clean up any spills right away.  Avoid walking on wet floors.  Keep items that you use a lot in easy-to-reach places.  If you need to reach something above you, use a strong step stool that has a grab bar.  Keep electrical cords out of the way.  Do not use floor polish or wax that makes floors slippery. If you must use wax, use non-skid floor wax.  Do  not have throw rugs and other things on the floor that can make you trip. What can I do with my stairs?  Do not leave any items on the stairs.  Make sure that there are handrails on both sides of the stairs and use them. Fix handrails that are broken or loose. Make sure that handrails are as long as the stairways.  Check any carpeting to make sure that it is firmly attached to the stairs. Fix any carpet that is loose or worn.  Avoid having throw rugs at the top or bottom of the stairs. If you do have throw rugs, attach them to the floor with carpet tape.  Make sure that you have a light switch at the top of the stairs and the bottom of the stairs. If you do not have them, ask someone to add them for you. What else can I do to help prevent falls?  Wear shoes that:  Do not have high heels.  Have rubber bottoms.  Are comfortable and fit you well.  Are closed at the toe. Do not wear sandals.  If you use a stepladder:  Make sure that it  is fully opened. Do not climb a closed stepladder.  Make sure that both sides of the stepladder are locked into place.  Ask someone to hold it for you, if possible.  Clearly mark and make sure that you can see:  Any grab bars or handrails.  First and last steps.  Where the edge of each step is.  Use tools that help you move around (mobility aids) if they are needed. These include:  Canes.  Walkers.  Scooters.  Crutches.  Turn on the lights when you go into a dark area. Replace any light bulbs as soon as they burn out.  Set up your furniture so you have a clear path. Avoid moving your furniture around.  If any of your floors are uneven, fix them.  If there are any pets around you, be aware of where they are.  Review your medicines with your doctor. Some medicines can make you feel dizzy. This can increase your chance of falling. Ask your doctor what other things that you can do to help prevent falls. This information is not intended to replace advice given to you by your health care provider. Make sure you discuss any questions you have with your health care provider. Document Released: 05/25/2009 Document Revised: 01/04/2016 Document Reviewed: 09/02/2014 Elsevier Interactive Patient Education  2017 [employer].

## 2018-06-24 NOTE — Progress Notes (Signed)
Gregory Haynes  MRN: [id number] DOB: Apr 06, 1941  Subjective:  HPI   The patient is a 77 year old male who presents for his follow up of chronic health.  He was in to see Nurse Health Advisor earlier today for his Medicare Wellness Exam. The patient was last seen on 10/20/17.  He is followed regularly by Pulmonary and had Pulmonary Function Testing done in October.  Patient is requesting a handicap sticker application be signed based on his current pulmonary condition.  Hypertension - His blood pressure readings have been stable.  Last BP readings  06/24/18 116/62 10/20/17 120/68 09/26/17 122/70  Hypercholesteremia Lab Results  Component Value Date   CHOL 178 05/02/2016   HDL 55 05/02/2016   LDLCALC 100 (H) 05/02/2016   TRIG 114 05/02/2016   COPD-followed by pulmonology   Patient Active Problem List   Diagnosis Date Noted  . Obstructive sleep apnea 05/08/2017  . Psoriatic arthritis ([place]) 10/31/2015  . 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 Esophagitis, reflux 05/10/2015  . BP (high blood pressure) 05/10/2015  . Positive H. pylori test 05/10/2015  . 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 Psoriasis 05/10/2015  . 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 Fam hx-ischem heart disease 03/23/2010  . Hypercholesteremia 03/23/2010  . Current tobacco use 03/23/2010    Past Medical History:  Diagnosis Date  . Anemia   . Cancer ([place])    [place]  . Helicobacter pylori ab+   . Hyperlipidemia   . Hypertension   . Psoriasis     Social History   Socioeconomic History  . Marital status: Married    Spouse name: Patsy  . Number of children: 2  . Years of education: Not on file  . Highest education level: Some college, no degree  Occupational History  . Occupation: retired  Scientific laboratory technician  . Financial resource  strain: Not hard at all  . Food insecurity:    Worry: Never true    Inability: Never true  . Transportation needs:    Medical: No    Non-medical: No  Tobacco Use  . Smoking status: Former Smoker    Packs/day: 1.50    Years: 55.00    Pack years: 82.50    Types: Cigarettes    Last attempt to quit: 08/13/2011    Years since quitting: 6.8  . Smokeless tobacco: Never Used  Substance and Sexual Activity  . Alcohol use: Yes    Comment: social /  couple drinks a month  . Drug use: No  . Sexual activity: Not on file  Lifestyle  . Physical activity:    Days per week: 6 days    Minutes per session: 40 min  . Stress: Only a little  Relationships  . Social connections:    Talks on phone: Patient refused    Gets together: Patient refused    Attends religious service: Patient refused    Active member of club or organization: Patient refused    Attends meetings of clubs or organizations: Patient refused    Relationship status: Patient refused  . Intimate partner violence:    Fear of current or ex partner: Patient refused    Emotionally abused: Patient refused    Physically abused: Patient refused    Forced sexual activity: Patient refused  Other Topics Concern  . Not on file  Social  History Narrative  . Not on file    Outpatient Encounter Medications as of 06/24/2018  Medication Sig Note  . albuterol (PROAIR HFA) 108 (90 BASE) MCG/ACT inhaler Inhale into the lungs. 09/12/2017: PRN  . aspirin 81 MG tablet Take 81 mg by mouth daily.    Marland Kitchen atorvastatin (LIPITOR) 40 MG tablet TAKE ONE TABLET  EACH EVENING   . BREO ELLIPTA 200-25 MCG/INH AEPB Take 1 puff by mouth daily.   . carvedilol (COREG) 12.5 MG tablet TAKE 1 TABLET BY MOUTH EVERY 12 HOURS   . colchicine 0.6 MG tablet Take 1 tablet (0.6 mg total) by mouth 2 (two) times daily. (Patient not taking: Reported on 10/20/2017)   . Fluticasone-Umeclidin-Vilant (TRELEGY ELLIPTA) 100-62.5-25 MCG/INH AEPB Inhale 1 puff into the lungs daily.   .  hydrochlorothiazide (HYDRODIURIL) 25 MG tablet TAKE ONE TABLET BY MOUTH EVERY DAY   . indomethacin (INDOCIN) 50 MG capsule Take 1 capsule (50 mg total) by mouth 2 (two) times daily as needed. (Patient not taking: Reported on 10/20/2017)   . methotrexate (RHEUMATREX) 2.5 MG tablet Take 10 mg by mouth once a week.    . OXYGEN Inhale 2 L/hr into the lungs.    . pantoprazole (PROTONIX) 40 MG tablet TAKE 1 TABLET DAILY (Patient not taking: Reported on 06/24/2018)   . predniSONE (DELTASONE) 10 MG tablet Take 1 tablet (10 mg total) by mouth daily with breakfast. Taper down by one tablet daily (6,5,4,3,2,1) (Patient not taking: Reported on 10/20/2017)   . SPIRIVA HANDIHALER 18 MCG inhalation capsule TAKE CONTENTS OF ONE CAPSULE VIA HANDIHALER ONCE DAILY (Patient not taking: Reported on 10/20/2017)    No facility-administered encounter medications on file as of 06/24/2018.     No Known Allergies  Review of Systems  Constitutional: Negative.   HENT: Positive for hearing loss.   Eyes: Negative.   Respiratory: Positive for shortness of breath.   Cardiovascular: Negative.   Gastrointestinal: Negative.   Genitourinary: Negative.   Musculoskeletal: Negative.   Skin: Negative.   Neurological: Negative.   Endo/Heme/Allergies: Negative.   Psychiatric/Behavioral: Negative.     Objective:  BP 116/62   Pulse 70   Temp 98.5 F (36.9 C) (Oral)   Ht 6' (1.829 m)   Wt 201 lb (91.2 kg)   BMI 27.26 kg/m   Physical Exam  Constitutional: He is oriented to person, place, and time and well-developed, well-nourished, and in no distress.  HENT:  Head: Normocephalic and atraumatic.  Right Ear: External ear normal.  Left Ear: External ear normal.  Nose: Nose normal.  Mouth/Throat: Oropharynx is clear and moist.  Eyes: Conjunctivae are normal.  Neck: No thyromegaly present.  Cardiovascular: Normal rate, regular rhythm, normal heart sounds and intact distal pulses.  Pulmonary/Chest: Effort normal.    Abdominal: Soft.  Musculoskeletal: He exhibits no edema.  Neurological: He is alert and oriented to person, place, and time. Gait normal. GCS score is 15.  Skin: Skin is warm and dry.  Psychiatric: Mood, memory, affect and judgment normal.    Assessment and Plan :   1. Essential hypertension  - CBC with Differential/Platelet - Comprehensive metabolic panel - TSH  2. Anemia, unspecified type  - CBC with Differential/Platelet  3. Hypercholesteremia  - Lipid Panel With LDL/HDL Ratio  4. COPD, moderate ([place])   HPI, Exam and A&P Transcribed under the direction and in the presence of Wilhemena Durie., MD. Electronically Signed: Althea Charon, RMA I have done the exam and reviewed the chart and it  is accurate to the best of my knowledge. Development worker, community has been used and  any errors in dictation or transcription are unintentional. Miguel Aschoff M.D. [facility]

## 2018-06-25 LAB — COMPREHENSIVE METABOLIC PANEL
ALK PHOS: 71 IU/L (ref 39–117)
ALT: 11 IU/L (ref 0–44)
AST: 21 IU/L (ref 0–40)
Albumin/Globulin Ratio: 1.8 (ref 1.2–2.2)
Albumin: 4.6 g/dL (ref 3.5–4.8)
BILIRUBIN TOTAL: 0.4 mg/dL (ref 0.0–1.2)
BUN/Creatinine Ratio: 14 (ref 10–24)
BUN: 14 mg/dL (ref 8–27)
CHLORIDE: 97 mmol/L (ref 96–106)
CO2: 30 mmol/L — AB (ref 20–29)
CREATININE: 1.03 mg/dL (ref 0.76–1.27)
Calcium: 9.3 mg/dL (ref 8.6–10.2)
GFR calc Af Amer: 81 mL/min/{1.73_m2} (ref >59)
GFR calc non Af Amer: 70 mL/min/{1.73_m2} (ref >59)
GLUCOSE: 79 mg/dL (ref 65–99)
Globulin, Total: 2.5 g/dL (ref 1.5–4.5)
Potassium: 3.9 mmol/L (ref 3.5–5.2)
Sodium: 142 mmol/L (ref 134–144)
Total Protein: 7.1 g/dL (ref 6.0–8.5)

## 2018-06-25 LAB — TSH: TSH: 1.17 u[IU]/mL (ref 0.450–4.500)

## 2018-06-25 LAB — CBC WITH DIFFERENTIAL/PLATELET
Basophils Absolute: 0 10*3/uL (ref 0.0–0.2)
Basos: 0 %
EOS (ABSOLUTE): 0.1 10*3/uL (ref 0.0–0.4)
Eos: 2 %
HEMOGLOBIN: 13.3 g/dL (ref 13.0–17.7)
Hematocrit: 40.3 % (ref 37.5–51.0)
IMMATURE GRANS (ABS): 0 10*3/uL (ref 0.0–0.1)
Immature Granulocytes: 1 %
LYMPHS ABS: 1.8 10*3/uL (ref 0.7–3.1)
LYMPHS: 25 %
MCH: 29.9 pg (ref 26.6–33.0)
MCHC: 33 g/dL (ref 31.5–35.7)
MCV: 91 fL (ref 79–97)
MONOCYTES: 11 %
Monocytes Absolute: 0.8 10*3/uL (ref 0.1–0.9)
NEUTROS ABS: 4.6 10*3/uL (ref 1.4–7.0)
NEUTROS PCT: 61 %
PLATELETS: 309 10*3/uL (ref 150–450)
RBC: 4.45 x10E6/uL (ref 4.14–5.80)
RDW: 14.1 % (ref 12.3–15.4)
WBC: 7.4 10*3/uL (ref 3.4–10.8)

## 2018-06-25 LAB — LIPID PANEL WITH LDL/HDL RATIO
CHOLESTEROL TOTAL: 179 mg/dL (ref 100–199)
HDL: 51 mg/dL (ref >39)
LDL CALC: 100 mg/dL — AB (ref 0–99)
LDl/HDL Ratio: 2 ratio (ref 0.0–3.6)
Triglycerides: 138 mg/dL (ref 0–149)
VLDL CHOLESTEROL CAL: 28 mg/dL (ref 5–40)

## 2018-06-26 DIAGNOSIS — R0602 Shortness of breath: Secondary | ICD-10-CM | POA: Diagnosis not present

## 2018-06-26 DIAGNOSIS — R0609 Other forms of dyspnea: Secondary | ICD-10-CM | POA: Diagnosis not present

## 2018-06-26 DIAGNOSIS — I25118 Atherosclerotic heart disease of native coronary artery with other forms of angina pectoris: Secondary | ICD-10-CM | POA: Diagnosis not present

## 2018-07-01 DIAGNOSIS — H903 Sensorineural hearing loss, bilateral: Secondary | ICD-10-CM | POA: Diagnosis not present

## 2018-07-02 ENCOUNTER — Other Ambulatory Visit: Payer: Self-pay | Admitting: Family Medicine

## 2018-07-22 DIAGNOSIS — E7849 Other hyperlipidemia: Secondary | ICD-10-CM | POA: Diagnosis not present

## 2018-07-22 DIAGNOSIS — I25118 Atherosclerotic heart disease of native coronary artery with other forms of angina pectoris: Secondary | ICD-10-CM | POA: Diagnosis not present

## 2018-07-22 DIAGNOSIS — I10 Essential (primary) hypertension: Secondary | ICD-10-CM | POA: Diagnosis not present

## 2018-08-10 ENCOUNTER — Telehealth: Payer: Self-pay

## 2018-08-10 NOTE — Telephone Encounter (Signed)
Patient wife Pasty called on 08/08/2018 left a message via the medical call center (stated that her husband has COPD and wants to know if the doctor can give him a ZPAK for precautionary. His wife was in the office yesterday sick.) I called Pasty back today and she stated that MR. Thayer is doing better now and does not need the medication send in to the pharmacy anymore.

## 2018-08-21 DIAGNOSIS — J449 Chronic obstructive pulmonary disease, unspecified: Secondary | ICD-10-CM | POA: Diagnosis not present

## 2018-09-01 DIAGNOSIS — J449 Chronic obstructive pulmonary disease, unspecified: Secondary | ICD-10-CM | POA: Diagnosis not present

## 2018-09-10 ENCOUNTER — Ambulatory Visit (INDEPENDENT_AMBULATORY_CARE_PROVIDER_SITE_OTHER): Payer: Medicare HMO | Admitting: Family Medicine

## 2018-09-10 ENCOUNTER — Encounter: Payer: Self-pay | Admitting: Family Medicine

## 2018-09-10 VITALS — BP 110/72 | HR 88 | Temp 98.9°F | Resp 20 | Wt 194.0 lb

## 2018-09-10 DIAGNOSIS — J441 Chronic obstructive pulmonary disease with (acute) exacerbation: Secondary | ICD-10-CM | POA: Diagnosis not present

## 2018-09-10 DIAGNOSIS — J069 Acute upper respiratory infection, unspecified: Secondary | ICD-10-CM

## 2018-09-10 MED ORDER — DOXYCYCLINE HYCLATE 100 MG PO TABS: 100.0000 mg | ORAL_TABLET | Freq: Two times a day (BID) | ORAL | 0 refills | Status: DC

## 2018-09-10 MED ORDER — FLUTICASONE PROPIONATE 50 MCG/ACT NA SUSP: 2.0000 | Freq: Every day | NASAL | 6 refills | Status: DC

## 2018-09-10 MED ORDER — PREDNISONE 10 MG (21) PO TBPK: ORAL_TABLET | ORAL | 0 refills | Status: DC

## 2018-09-10 NOTE — Progress Notes (Signed)
Patient: Gregory Haynes Male    DOB: 02/10/1941   78 y.o.   MRN: [id number] Visit Date: 09/10/2018  Today's Provider: Wilhemena Durie, MD   Chief Complaint  Patient presents with  . URI   Subjective:     URI   This is a new problem. The current episode started in the past 7 days. The problem has been gradually worsening. There has been no fever. Associated symptoms include congestion, coughing, headaches, a plugged ear sensation, rhinorrhea and a sore throat. He has tried acetaminophen for the symptoms. The treatment provided mild relief.  Patient has had 3 days of nasal congestion sore throat headache and laryngitis.  No chest pain or shortness of breath.   No Known Allergies   Current Outpatient Medications:  .  albuterol (PROAIR HFA) 108 (90 BASE) MCG/ACT inhaler, Inhale into the lungs., Disp: , Rfl:  .  aspirin 81 MG tablet, Take 81 mg by mouth daily. , Disp: , Rfl:  .  atorvastatin (LIPITOR) 40 MG tablet, TAKE ONE TABLET EACH EVENING, Disp: 30 tablet, Rfl: 11 .  BREO ELLIPTA 200-25 MCG/INH AEPB, Take 1 puff by mouth daily., Disp: , Rfl: 10 .  carvedilol (COREG) 12.5 MG tablet, TAKE 1 TABLET BY MOUTH EVERY 12 HOURS, Disp: 180 tablet, Rfl: 3 .  Fluticasone-Umeclidin-Vilant (TRELEGY ELLIPTA) 100-62.5-25 MCG/INH AEPB, Inhale 1 puff into the lungs daily., Disp: , Rfl:  .  hydrochlorothiazide (HYDRODIURIL) 25 MG tablet, TAKE ONE TABLET BY MOUTH EVERY DAY, Disp: 90 tablet, Rfl: 3 .  methotrexate (RHEUMATREX) 2.5 MG tablet, Take 10 mg by mouth once a week. , Disp: , Rfl: 2 .  OXYGEN, Inhale 2 L/hr into the lungs. , Disp: , Rfl:  .  colchicine 0.6 MG tablet, Take 1 tablet (0.6 mg total) by mouth 2 (two) times daily. (Patient not taking: Reported on 10/20/2017), Disp: 60 tablet, Rfl: 5 .  indomethacin (INDOCIN) 50 MG capsule, Take 1 capsule (50 mg total) by mouth 2 (two) times daily as needed. (Patient not taking: Reported on 10/20/2017), Disp: 60 capsule, Rfl: 0 .  pantoprazole  (PROTONIX) 40 MG tablet, TAKE 1 TABLET DAILY (Patient not taking: Reported on 06/24/2018), Disp: 30 tablet, Rfl: 12 .  predniSONE (DELTASONE) 10 MG tablet, Take 1 tablet (10 mg total) by mouth daily with breakfast. Taper down by one tablet daily (6,5,4,3,2,1) (Patient not taking: Reported on 10/20/2017), Disp: 21 tablet, Rfl: 0 .  SPIRIVA HANDIHALER 18 MCG inhalation capsule, TAKE CONTENTS OF ONE CAPSULE VIA HANDIHALER ONCE DAILY (Patient not taking: Reported on 10/20/2017), Disp: 30 capsule, Rfl: 11  Review of Systems  Constitutional: Negative.   HENT: Positive for congestion, rhinorrhea and sore throat.   Eyes: Negative.   Respiratory: Positive for cough.   Cardiovascular: Negative.   Gastrointestinal: Negative.   Endocrine: Negative.   Allergic/Immunologic: Negative.   Neurological: Positive for headaches.  Psychiatric/Behavioral: Negative.     Social History   Tobacco Use  . Smoking status: Former Smoker    Packs/day: 1.50    Years: 55.00    Pack years: 82.50    Types: Cigarettes    Last attempt to quit: 08/13/2011    Years since quitting: 7.0  . Smokeless tobacco: Never Used  Substance Use Topics  . Alcohol use: Yes    Comment: social /  couple drinks a month      Objective:   BP 110/72 (BP Location: Left Arm, Patient Position: Sitting, Cuff Size: Large)  Pulse 88   Temp 98.9 F (37.2 C)   Resp 20   Wt 194 lb (88 kg)   SpO2 94%   BMI 26.31 kg/m  Vitals:   09/10/18 0808  BP: 110/72  Pulse: 88  Resp: 20  Temp: 98.9 F (37.2 C)  SpO2: 94%  Weight: 194 lb (88 kg)     Physical Exam Constitutional:      Appearance: He is well-developed.  HENT:     Head: Normocephalic and atraumatic.  Eyes:     General: No scleral icterus.    Conjunctiva/sclera: Conjunctivae normal.  Neck:     Thyroid: No thyromegaly.  Cardiovascular:     Rate and Rhythm: Normal rate and regular rhythm.     Heart sounds: Normal heart sounds.  Pulmonary:     Effort: Pulmonary effort is  normal.     Breath sounds: Normal breath sounds.  Abdominal:     Palpations: Abdomen is soft.  Skin:    General: Skin is warm and dry.  Neurological:     Mental Status: He is alert and oriented to person, place, and time.  Psychiatric:        Behavior: Behavior normal.        Thought Content: Thought content normal.        Judgment: Judgment normal.         Assessment & Plan    1. Upper respiratory tract infection, unspecified type  - fluticasone (FLONASE) 50 MCG/ACT nasal spray; Place 2 sprays into both nostrils daily.  Dispense: 16 g; Refill: 6  2. COPD exacerbation ([place]) Antibiotics written just in case he worsens with his severe COPD.  Will hold these and only take them if he gets worse. - doxycycline (VIBRA-TABS) 100 MG tablet; Take 1 tablet (100 mg total) by mouth 2 (two) times daily.  Dispense: 20 tablet; Refill: 0 - predniSONE (STERAPRED UNI-PAK 21 TAB) 10 MG (21) TBPK tablet; Taper as directed.  Dispense: 21 tablet; Refill: 0    I have done the exam and reviewed the above chart and it is accurate to the best of my knowledge. Development worker, community has been used in this note in any air is in the dictation or transcription are unintentional.  Wilhemena Durie, MD  [place]

## 2018-09-10 NOTE — Patient Instructions (Signed)
Start OTC Robitussin nightly for cough.

## 2018-09-21 DIAGNOSIS — J449 Chronic obstructive pulmonary disease, unspecified: Secondary | ICD-10-CM | POA: Diagnosis not present

## 2018-10-05 DIAGNOSIS — R69 Illness, unspecified: Secondary | ICD-10-CM | POA: Diagnosis not present

## 2018-10-20 DIAGNOSIS — J449 Chronic obstructive pulmonary disease, unspecified: Secondary | ICD-10-CM | POA: Diagnosis not present

## 2018-11-20 DIAGNOSIS — J449 Chronic obstructive pulmonary disease, unspecified: Secondary | ICD-10-CM | POA: Diagnosis not present

## 2018-12-20 DIAGNOSIS — J449 Chronic obstructive pulmonary disease, unspecified: Secondary | ICD-10-CM | POA: Diagnosis not present

## 2019-01-20 DIAGNOSIS — J449 Chronic obstructive pulmonary disease, unspecified: Secondary | ICD-10-CM | POA: Diagnosis not present

## 2019-02-19 DIAGNOSIS — J449 Chronic obstructive pulmonary disease, unspecified: Secondary | ICD-10-CM | POA: Diagnosis not present

## 2019-03-22 DIAGNOSIS — J449 Chronic obstructive pulmonary disease, unspecified: Secondary | ICD-10-CM | POA: Diagnosis not present

## 2019-04-01 ENCOUNTER — Other Ambulatory Visit: Payer: Self-pay | Admitting: Family Medicine

## 2019-04-01 MED ORDER — CARVEDILOL 12.5 MG PO TABS: 12.5000 mg | ORAL_TABLET | Freq: Two times a day (BID) | ORAL | 0 refills | Status: DC

## 2019-04-01 NOTE — Telephone Encounter (Signed)
°  Pt needing refills on:  carvedilol (COREG) 12.5 MG tablet  Please fill at:  [place], [place] [phone] (Phone) [phone] (Fax)      Thanks, [place]

## 2019-04-08 DIAGNOSIS — B370 Candidal stomatitis: Secondary | ICD-10-CM | POA: Diagnosis not present

## 2019-04-22 DIAGNOSIS — J449 Chronic obstructive pulmonary disease, unspecified: Secondary | ICD-10-CM | POA: Diagnosis not present

## 2019-04-23 DIAGNOSIS — R69 Illness, unspecified: Secondary | ICD-10-CM | POA: Diagnosis not present

## 2019-05-04 DIAGNOSIS — Z79899 Other long term (current) drug therapy: Secondary | ICD-10-CM | POA: Diagnosis not present

## 2019-05-06 DIAGNOSIS — L400 Psoriasis vulgaris: Secondary | ICD-10-CM | POA: Diagnosis not present

## 2019-05-06 DIAGNOSIS — Z79899 Other long term (current) drug therapy: Secondary | ICD-10-CM | POA: Diagnosis not present

## 2019-05-22 DIAGNOSIS — J449 Chronic obstructive pulmonary disease, unspecified: Secondary | ICD-10-CM | POA: Diagnosis not present

## 2019-06-08 ENCOUNTER — Other Ambulatory Visit: Payer: Self-pay | Admitting: Family Medicine

## 2019-06-08 NOTE — Telephone Encounter (Signed)
Pharmacy requesting refills. Thanks!  

## 2019-06-17 ENCOUNTER — Telehealth: Payer: Self-pay | Admitting: Family Medicine

## 2019-06-17 NOTE — Telephone Encounter (Signed)
Patient will have to be seen in office before we can refill his prescriptions. I have called and LAM for patient to schedule with [place].

## 2019-06-17 NOTE — Telephone Encounter (Signed)
Pt's wife contacted office for refill request on the following medications:  hydrochlorothiazide (HYDRODIURIL) 25 MG tablet   90 day supply Kristopher Oppenheim Last Rx: 03/26/2018 LOV: 09/10/2018 NOV: 07/01/2019 Please advise. Thanks TNP

## 2019-06-18 ENCOUNTER — Other Ambulatory Visit: Payer: Self-pay

## 2019-06-18 ENCOUNTER — Encounter: Payer: Self-pay | Admitting: Physician Assistant

## 2019-06-18 ENCOUNTER — Ambulatory Visit (INDEPENDENT_AMBULATORY_CARE_PROVIDER_SITE_OTHER): Payer: Medicare HMO | Admitting: Physician Assistant

## 2019-06-18 VITALS — BP 108/77 | HR 73 | Temp 96.8°F | Resp 16 | Wt 191.4 lb

## 2019-06-18 DIAGNOSIS — I10 Essential (primary) hypertension: Secondary | ICD-10-CM

## 2019-06-18 DIAGNOSIS — E7800 Pure hypercholesterolemia, unspecified: Secondary | ICD-10-CM

## 2019-06-18 MED ORDER — HYDROCHLOROTHIAZIDE 25 MG PO TABS: 25.0000 mg | ORAL_TABLET | Freq: Every day | ORAL | 1 refills | Status: DC

## 2019-06-18 NOTE — Patient Instructions (Signed)

## 2019-06-18 NOTE — Progress Notes (Signed)
Patient: Gregory Haynes Male    DOB: 05/10/1941   78 y.o.   MRN: [id number] Visit Date: 06/18/2019  Today's Provider: Trinna Post, PA-C   Chief Complaint  Patient presents with  . Hypertension  . Hyperlipidemia   Subjective:     HPI  Hypertension, follow-up:  BP Readings from Last 3 Encounters:  06/18/19 108/77  09/10/18 110/72  06/24/18 116/62    He was last seen for hypertension 11 months ago.  BP at that visit was 116/62. Management changes since that visit include none. He reports excellent compliance with treatment. He is not having side effects.  He is exercising. He is adherent to low salt diet.   Outside blood pressures are not being checked. He is experiencing none.  Patient denies chest pain, chest pressure/discomfort and dyspnea.   Cardiovascular risk factors include advanced age (older than 70 for men, 62 for women), hypertension and male gender.  Use of agents associated with hypertension: NSAIDS.     Weight trend: stable Wt Readings from Last 3 Encounters:  06/18/19 191 lb 6.4 oz (86.8 kg)  09/10/18 194 lb (88 kg)  06/24/18 201 lb (91.2 kg)    Current diet: in general, a "healthy" diet    ------------------------------------------------------------------------  Lipid/Cholesterol, Follow-up:   Last seen for this11 months ago.  Management changes since that visit include none. . Last Lipid Panel:    Component Value Date/Time   CHOL 179 06/24/2018 1508   TRIG 138 06/24/2018 1508   HDL 51 06/24/2018 1508   LDLCALC 100 (H) 06/24/2018 1508    Risk factors for vascular disease include hypercholesterolemia and hypertension  He reports excellent compliance with treatment. He is not having side effects.  Current symptoms include none and have been unchanged. Weight trend: stable Prior visit with dietician: no Current diet: in general, a "healthy" diet   Current exercise: none  Wt Readings from Last 3 Encounters:  06/18/19 191  lb 6.4 oz (86.8 kg)  09/10/18 194 lb (88 kg)  06/24/18 201 lb (91.2 kg)   He has an upcoming physical with Dr. Rosanna Randy later this month and is due for labs. He is walking around at the [facility] for exercise.  -------------------------------------------------------------------  No Known Allergies   Current Outpatient Medications:  .  aspirin 81 MG tablet, Take 81 mg by mouth daily. , Disp: , Rfl:  .  atorvastatin (LIPITOR) 40 MG tablet, TAKE ONE TABLET BY MOUTH EVERY EVENING, Disp: 90 tablet, Rfl: 3 .  fluticasone (FLONASE) 50 MCG/ACT nasal spray, Place 2 sprays into both nostrils daily., Disp: 16 g, Rfl: 6 .  Fluticasone-Umeclidin-Vilant (TRELEGY ELLIPTA) 100-62.5-25 MCG/INH AEPB, Inhale 1 puff into the lungs daily., Disp: , Rfl:  .  hydrochlorothiazide (HYDRODIURIL) 25 MG tablet, TAKE ONE TABLET BY MOUTH EVERY DAY, Disp: 90 tablet, Rfl: 3 .  methotrexate (RHEUMATREX) 2.5 MG tablet, Take 10 mg by mouth once a week. , Disp: , Rfl: 2 .  OXYGEN, Inhale 2 L/hr into the lungs. , Disp: , Rfl:  .  colchicine 0.6 MG tablet, Take 1 tablet (0.6 mg total) by mouth 2 (two) times daily. (Patient not taking: Reported on 10/20/2017), Disp: 60 tablet, Rfl: 5 .  indomethacin (INDOCIN) 50 MG capsule, Take 1 capsule (50 mg total) by mouth 2 (two) times daily as needed. (Patient not taking: Reported on 10/20/2017), Disp: 60 capsule, Rfl: 0  Review of Systems  Social History   Tobacco Use  . Smoking  status: Former Smoker    Packs/day: 1.50    Years: 55.00    Pack years: 82.50    Types: Cigarettes    Quit date: 08/13/2011    Years since quitting: 7.8  . Smokeless tobacco: Never Used  Substance Use Topics  . Alcohol use: Yes    Comment: social /  couple drinks a month      Objective:   BP 108/77   Pulse 73   Temp (!) 96.8 F (36 C) (Oral)   Resp 16   Wt 191 lb 6.4 oz (86.8 kg)   BMI 25.96 kg/m  Vitals:   06/18/19 1107  BP: 108/77  Pulse: 73  Resp: 16  Temp: (!) 96.8 F (36 C)    TempSrc: Oral  Weight: 191 lb 6.4 oz (86.8 kg)  Body mass index is 25.96 kg/m.   Physical Exam Constitutional:      Appearance: Normal appearance.  Cardiovascular:     Rate and Rhythm: Normal rate and regular rhythm.     Heart sounds: Normal heart sounds.  Pulmonary:     Effort: Pulmonary effort is normal.     Breath sounds: Normal breath sounds.  Skin:    General: Skin is warm and dry.  Neurological:     Mental Status: He is alert and oriented to person, place, and time. Mental status is at baseline.  Psychiatric:        Mood and Affect: Mood normal.        Behavior: Behavior normal.      No results found for any visits on 06/18/19.     Assessment & Plan    1. Essential hypertension  Refill HCTZ as below. Continue with coreg. BP well controlled. Labs as below so they are available for his physical later this month.   - Lipid Profile - Comprehensive Metabolic Panel (CMET) - TSH - CBC with Differential - Direct LDL - hydrochlorothiazide (HYDRODIURIL) 25 MG tablet; Take 1 tablet (25 mg total) by mouth daily.  Dispense: 90 tablet; Refill: 1  2. Hypercholesteremia  The entirety of the information documented in the History of Present Illness, Review of Systems and Physical Exam were personally obtained by me. Portions of this information were initially documented by Jennings Books, CMA and reviewed by me for thoroughness and accuracy.    F/u with PCP for CPE       Trinna Post, PA-C  [place]

## 2019-06-19 LAB — CBC WITH DIFFERENTIAL/PLATELET
Basophils Absolute: 0 10*3/uL (ref 0.0–0.2)
Basos: 0 %
EOS (ABSOLUTE): 0.2 10*3/uL (ref 0.0–0.4)
Eos: 3 %
Hematocrit: 42 % (ref 37.5–51.0)
Hemoglobin: 14.1 g/dL (ref 13.0–17.7)
Immature Grans (Abs): 0 10*3/uL (ref 0.0–0.1)
Immature Granulocytes: 0 %
Lymphocytes Absolute: 1.6 10*3/uL (ref 0.7–3.1)
Lymphs: 20 %
MCH: 31.5 pg (ref 26.6–33.0)
MCHC: 33.6 g/dL (ref 31.5–35.7)
MCV: 94 fL (ref 79–97)
Monocytes Absolute: 0.8 10*3/uL (ref 0.1–0.9)
Monocytes: 9 %
Neutrophils Absolute: 5.5 10*3/uL (ref 1.4–7.0)
Neutrophils: 68 %
Platelets: 253 10*3/uL (ref 150–450)
RBC: 4.48 x10E6/uL (ref 4.14–5.80)
RDW: 13.1 % (ref 11.6–15.4)
WBC: 8.2 10*3/uL (ref 3.4–10.8)

## 2019-06-19 LAB — COMPREHENSIVE METABOLIC PANEL
ALT: 10 IU/L (ref 0–44)
AST: 20 IU/L (ref 0–40)
Albumin/Globulin Ratio: 2 (ref 1.2–2.2)
Albumin: 4.5 g/dL (ref 3.7–4.7)
Alkaline Phosphatase: 75 IU/L (ref 39–117)
BUN/Creatinine Ratio: 17 (ref 10–24)
BUN: 21 mg/dL (ref 8–27)
Bilirubin Total: 0.5 mg/dL (ref 0.0–1.2)
CO2: 29 mmol/L (ref 20–29)
Calcium: 9.7 mg/dL (ref 8.6–10.2)
Chloride: 97 mmol/L (ref 96–106)
Creatinine, Ser: 1.22 mg/dL (ref 0.76–1.27)
GFR calc Af Amer: 66 mL/min/{1.73_m2} (ref >59)
GFR calc non Af Amer: 57 mL/min/{1.73_m2} — ABNORMAL LOW (ref >59)
Globulin, Total: 2.3 g/dL (ref 1.5–4.5)
Glucose: 97 mg/dL (ref 65–99)
Potassium: 4 mmol/L (ref 3.5–5.2)
Sodium: 141 mmol/L (ref 134–144)
Total Protein: 6.8 g/dL (ref 6.0–8.5)

## 2019-06-19 LAB — LIPID PANEL
Chol/HDL Ratio: 3.2 ratio (ref 0.0–5.0)
Cholesterol, Total: 155 mg/dL (ref 100–199)
HDL: 48 mg/dL (ref >39)
LDL Chol Calc (NIH): 84 mg/dL (ref 0–99)
Triglycerides: 132 mg/dL (ref 0–149)
VLDL Cholesterol Cal: 23 mg/dL (ref 5–40)

## 2019-06-19 LAB — TSH: TSH: 0.913 u[IU]/mL (ref 0.450–4.500)

## 2019-06-19 LAB — LDL CHOLESTEROL, DIRECT: LDL Direct: 91 mg/dL (ref 0–99)

## 2019-06-22 ENCOUNTER — Telehealth: Payer: Self-pay

## 2019-06-22 DIAGNOSIS — H35372 Puckering of macula, left eye: Secondary | ICD-10-CM | POA: Diagnosis not present

## 2019-06-22 DIAGNOSIS — H2513 Age-related nuclear cataract, bilateral: Secondary | ICD-10-CM | POA: Diagnosis not present

## 2019-06-22 DIAGNOSIS — J449 Chronic obstructive pulmonary disease, unspecified: Secondary | ICD-10-CM | POA: Diagnosis not present

## 2019-06-22 NOTE — Telephone Encounter (Signed)
-----   Message from Trinna Post, [place] sent at 06/21/2019  4:34 PM EST ----- Labs look stable. He can go over them further at his CPE with Dr. Darnell Level.

## 2019-06-22 NOTE — Telephone Encounter (Signed)
Patient's wife Tessie Fass) was advised and states that she will advise the patient as well.

## 2019-06-30 NOTE — Progress Notes (Signed)
Subjective:   Gregory Haynes is a 78 y.o. male who presents for Medicare Annual/Subsequent preventive examination.  Review of Systems:  N/A  Cardiac Risk Factors include: advanced age (>31men, >62 women);dyslipidemia;hypertension     Objective:    Vitals: BP 108/64 (BP Location: Right Arm)    Pulse 68    Temp (!) 97.1 F (36.2 C) (Oral)    Ht 6' (1.829 m)    Wt 191 lb 12.8 oz (87 kg)    SpO2 95%    BMI 26.01 kg/m   Body mass index is 26.01 kg/m.  Advanced Directives 07/01/2019 06/24/2018 05/02/2017  Does Patient Have a Medical Advance Directive? Yes Yes Yes  Type of Paramedic of [place];Living will [place];Living will [employer];Living will  Copy of [place] in Chart? No - copy requested No - copy requested No - copy requested    Tobacco Social History   Tobacco Use  Smoking Status Former Smoker   Packs/day: 1.50   Years: 55.00   Pack years: 82.50   Types: Cigarettes   Quit date: 08/13/2011   Years since quitting: 7.8  Smokeless Tobacco Never Used     Counseling given: Not Answered   Clinical Intake:  Pre-visit preparation completed: Yes  Pain : No/denies pain Pain Score: 0-No pain     Nutritional Risks: None Diabetes: No  How often do you need to have someone help you when you read instructions, pamphlets, or other written materials from your doctor or pharmacy?: 1 - Never  Interpreter Needed?: No  Information entered by :: Mmarkoski, LPN  Past Medical History:  Diagnosis Date   Anemia    Cancer ([place])    BCC   Cataract    Helicobacter pylori ab+    Hyperlipidemia    Hypertension    Psoriasis    Past Surgical History:  Procedure Laterality Date   NO PAST SURGERIES     Family History  Problem Relation Age of Onset   Heart attack Father    Heart disease Brother    Hyperlipidemia Brother    Alcohol abuse Brother    Social History    Socioeconomic History   Marital status: Married    Spouse name: Gregory Haynes   Number of children: 2   Years of education: Not on file   Highest education level: Some college, no degree  Occupational History   Occupation: retired  Scientist, product/process development strain: Not hard at all   Food insecurity    Worry: Never true    Inability: Never true   Transportation needs    Medical: No    Non-medical: No  Tobacco Use   Smoking status: Former Smoker    Packs/day: 1.50    Years: 55.00    Pack years: 82.50    Types: Cigarettes    Quit date: 08/13/2011    Years since quitting: 7.8   Smokeless tobacco: Never Used  Substance and Sexual Activity   Alcohol use: Yes    Comment: occasionally   Drug use: No   Sexual activity: Not on file  Lifestyle   Physical activity    Days per week: 6 days    Minutes per session: 50 min   Stress: Not at all  Relationships   Social connections    Talks on phone: Patient refused    Gets together: Patient refused    Attends religious service: Patient refused    Active  member of club or organization: Patient refused    Attends meetings of clubs or organizations: Patient refused    Relationship status: Patient refused  Other Topics Concern   Not on file  Social History Narrative   Not on file    Outpatient Encounter Medications as of 07/01/2019  Medication Sig   aspirin 81 MG tablet Take 81 mg by mouth daily.    atorvastatin (LIPITOR) 40 MG tablet TAKE ONE TABLET BY MOUTH EVERY EVENING   carvedilol (COREG) 12.5 MG tablet Take 12.5 mg by mouth 2 (two) times daily with a meal.    Fluticasone-Umeclidin-Vilant (TRELEGY ELLIPTA) 100-62.5-25 MCG/INH AEPB Inhale 1 puff into the lungs daily.   folic acid (FOLVITE) 1 MG tablet Take 1 mg by mouth daily. Unsure dose   hydrochlorothiazide (HYDRODIURIL) 25 MG tablet Take 1 tablet (25 mg total) by mouth daily.   methotrexate (RHEUMATREX) 2.5 MG tablet Take 10 mg by mouth once a  week. On Friday   OXYGEN Inhale 2 L/hr into the lungs.    colchicine 0.6 MG tablet Take 1 tablet (0.6 mg total) by mouth 2 (two) times daily. (Patient not taking: Reported on 10/20/2017)   fluticasone (FLONASE) 50 MCG/ACT nasal spray Place 2 sprays into both nostrils daily. (Patient not taking: Reported on 07/01/2019)   indomethacin (INDOCIN) 50 MG capsule Take 1 capsule (50 mg total) by mouth 2 (two) times daily as needed. (Patient not taking: Reported on 10/20/2017)   No facility-administered encounter medications on file as of 07/01/2019.     Activities of Daily Living In your present state of health, do you have any difficulty performing the following activities: 07/01/2019  Hearing? N  Comment Wears bilateral hearing aids.  Vision? N  Comment Wears eye glasses.  Difficulty concentrating or making decisions? N  Walking or climbing stairs? N  Comment Will get SOB when climbing stairs.  Dressing or bathing? N  Doing errands, shopping? N  Preparing Food and eating ? N  Using the Toilet? N  In the past six months, have you accidently leaked urine? N  Do you have problems with loss of bowel control? N  Managing your Medications? N  Managing your Finances? N  Housekeeping or managing your Housekeeping? N  Some recent data might be hidden    Patient Care Team: Gregory Haynes., MD as PCP - General (Family Medicine) Bari Mantis, MD as Referring Physician (Internal Medicine) Beverly Gust, MD as Consulting Physician (Otolaryngology) Tilda Franco, MD as Referring Physician (Cardiology) Dasher, Rayvon Char, MD (Dermatology)   Assessment:   This is a routine wellness examination for Gregory Haynes.  Exercise Activities and Dietary recommendations Current Exercise Habits: Home exercise routine, Type of exercise: walking, Time (Minutes): 50, Frequency (Times/Week): 6, Weekly Exercise (Minutes/Week): 300, Intensity: Mild, Exercise limited by: respiratory conditions(s)  Goals      Increase water intake     Recommend increasing water intake to 4 glasses of water a day.    06/24/18- Continue to cut back on sweet tea and increase water intake to 4-6 glasses a day.        Fall Risk: Fall Risk  07/01/2019 06/24/2018 05/02/2017 04/30/2016 05/11/2015  Falls in the past year? 0 0 No No No  Number falls in past yr: 0 - - - -  Injury with Fall? 0 - - - -    FALL RISK PREVENTION PERTAINING TO THE HOME:  Any stairs in or around the home? Yes  If so, are there any without  handrails? No   Home free of loose throw rugs in walkways, pet beds, electrical cords, etc? Yes  Adequate lighting in your home to reduce risk of falls? Yes   ASSISTIVE DEVICES UTILIZED TO PREVENT FALLS:  Life alert? No  Use of a cane, walker or w/c? Yes  Grab bars in the bathroom? No  Shower chair or bench in shower? No  Elevated toilet seat or a handicapped toilet? No   TIMED UP AND GO:  Was the test performed? No .    Depression Screen PHQ 2/9 Scores 07/01/2019 06/24/2018 05/02/2017 04/30/2016  PHQ - 2 Score 0 1 0 0  PHQ- 9 Score - - - -    Cognitive Function: Declined today.      6CIT Screen 06/24/2018 05/02/2017  What Year? 0 points 0 points  What month? 0 points 0 points  What time? 0 points 0 points  Count back from 20 0 points 0 points  Months in reverse 0 points 2 points  Repeat phrase 0 points 2 points  Total Score 0 4    Immunization History  Administered Date(s) Administered   Influenza, High Dose Seasonal PF 04/30/2016, 05/05/2018, 04/23/2019   Influenza-Unspecified 06/12/2017   Pneumococcal Conjugate-13 07/19/2013, 11/10/2014   Pneumococcal Polysaccharide-23 04/30/2016   Tdap 11/05/2011, 03/19/2012   Zoster 11/05/2011    Qualifies for Shingles Vaccine? Yes  Zostavax completed 11/05/11. Due for Shingrix. Pt has been advised to call insurance company to determine out of pocket expense. Advised may also receive vaccine at local pharmacy or Health Dept. Verbalized  acceptance and understanding.  Tdap: Up to date  Flu Vaccine: Up to date  Pneumococcal Vaccine: Completed series  Screening Tests Health Maintenance  Topic Date Due   TETANUS/TDAP  03/19/2022   INFLUENZA VACCINE  Completed   PNA vac Low Risk Adult  Completed   Cancer Screenings:  Colorectal Screening: No longer required.   Lung Cancer Screening: (Low Dose CT Chest recommended if Age 28-80 years, 30 pack-year currently smoking OR have quit w/in 15years.) does not qualify.   Additional Screening:  Vision Screening: Recommended annual ophthalmology exams for early detection of glaucoma and other disorders of the eye.  Dental Screening: Recommended annual dental exams for proper oral hygiene  Community Resource Referral:  CRR required this visit?  No        Plan:  I have personally reviewed and addressed the Medicare Annual Wellness questionnaire and have noted the following in the patients chart:  A. Medical and social history B. Use of alcohol, tobacco or illicit drugs  C. Current medications and supplements D. Functional ability and status E.  Nutritional status F.  Physical activity G. Advance directives H. List of other physicians I.  Hospitalizations, surgeries, and ER visits in previous 12 months J.  [place] such as hearing and vision if needed, cognitive and depression L. Referrals and appointments   In addition, I have reviewed and discussed with patient certain preventive protocols, quality metrics, and best practice recommendations. A written personalized care plan for preventive services as well as general preventive health recommendations were provided to patient.   Glendora Score, LPN  [id number] Nurse Health Advisor   Nurse Notes: None.

## 2019-06-30 NOTE — Progress Notes (Deleted)
   {  Method of visit:23308}  Patient: Gregory Haynes Male    DOB: [birth year]   78 y.o.   MRN: [id number] Visit Date: 06/30/2019  Today's Provider: Wilhemena Durie, MD   No chief complaint on file.  Subjective:    Patient had AWV with [facility] today at 9:00 am.  HPI   Essential hypertension From 06/18/2019-seen by Carles Collet. Refilled HCTZ as below. Continue with coreg. BP well controlled. Labs as below so they are available for his physical later this month. Labs stable.  Hypercholesteremia From 06/18/2019-seen by Carles Collet. Labs checked, stable.   No Known Allergies   Current Outpatient Medications:  .  aspirin 81 MG tablet, Take 81 mg by mouth daily. , Disp: , Rfl:  .  atorvastatin (LIPITOR) 40 MG tablet, TAKE ONE TABLET BY MOUTH EVERY EVENING, Disp: 90 tablet, Rfl: 3 .  colchicine 0.6 MG tablet, Take 1 tablet (0.6 mg total) by mouth 2 (two) times daily. (Patient not taking: Reported on 10/20/2017), Disp: 60 tablet, Rfl: 5 .  fluticasone (FLONASE) 50 MCG/ACT nasal spray, Place 2 sprays into both nostrils daily., Disp: 16 g, Rfl: 6 .  Fluticasone-Umeclidin-Vilant (TRELEGY ELLIPTA) 100-62.5-25 MCG/INH AEPB, Inhale 1 puff into the lungs daily., Disp: , Rfl:  .  hydrochlorothiazide (HYDRODIURIL) 25 MG tablet, Take 1 tablet (25 mg total) by mouth daily., Disp: 90 tablet, Rfl: 1 .  indomethacin (INDOCIN) 50 MG capsule, Take 1 capsule (50 mg total) by mouth 2 (two) times daily as needed. (Patient not taking: Reported on 10/20/2017), Disp: 60 capsule, Rfl: 0 .  methotrexate (RHEUMATREX) 2.5 MG tablet, Take 10 mg by mouth once a week. , Disp: , Rfl: 2 .  OXYGEN, Inhale 2 L/hr into the lungs. , Disp: , Rfl:   Review of Systems  Social History   Tobacco Use  . Smoking status: Former Smoker    Packs/day: 1.50    Years: 55.00    Pack years: 82.50    Types: Cigarettes    Quit date: 08/13/2011    Years since quitting: 7.8  . Smokeless tobacco: Never Used  Substance Use  Topics  . Alcohol use: Yes    Comment: social /  couple drinks a month      Objective:   There were no vitals taken for this visit. There were no vitals filed for this visit.There is no height or weight on file to calculate BMI.   Physical Exam   No results found for any visits on 07/01/19.     Assessment & Plan        Wilhemena Durie, MD  [facility]

## 2019-07-01 ENCOUNTER — Other Ambulatory Visit: Payer: Self-pay

## 2019-07-01 ENCOUNTER — Ambulatory Visit (INDEPENDENT_AMBULATORY_CARE_PROVIDER_SITE_OTHER): Payer: Medicare HMO | Admitting: Family Medicine

## 2019-07-01 ENCOUNTER — Ambulatory Visit (INDEPENDENT_AMBULATORY_CARE_PROVIDER_SITE_OTHER): Payer: Medicare HMO

## 2019-07-01 ENCOUNTER — Encounter: Payer: Self-pay | Admitting: Family Medicine

## 2019-07-01 VITALS — BP 108/64 | HR 68 | Temp 97.1°F | Ht 72.0 in | Wt 191.8 lb

## 2019-07-01 DIAGNOSIS — Z0000 Encounter for general adult medical examination without abnormal findings: Secondary | ICD-10-CM

## 2019-07-01 DIAGNOSIS — I10 Essential (primary) hypertension: Secondary | ICD-10-CM

## 2019-07-01 DIAGNOSIS — J449 Chronic obstructive pulmonary disease, unspecified: Secondary | ICD-10-CM | POA: Diagnosis not present

## 2019-07-01 NOTE — Progress Notes (Deleted)
{Method of visit:23308}    Patient: Gregory Haynes, Male    DOB: May 18, 1941, 78 y.o.   MRN: [id number] Visit Date: 07/01/2019  Today's Provider: Wilhemena Durie, MD   Chief Complaint  Patient presents with   Annual Exam   Subjective:     Patient had AWV with [facility] today at 9:00 am.   Complete Physical Gregory Haynes is a 78 y.o. male. He feels well. He reports exercising yes/walking. He reports he is sleeping fairly well.  -------------------------------------------------------  Colonoscopy: 11/19/2012  Review of Systems  Eyes: Positive for visual disturbance.  All other systems reviewed and are negative.   Social History   Socioeconomic History   Marital status: Married    Spouse name: Patsy   Number of children: 2   Years of education: Not on file   Highest education level: Some college, no degree  Occupational History   Occupation: retired  Scientist, product/process development strain: Not hard at all   Food insecurity    Worry: Never true    Inability: Never true   Transportation needs    Medical: No    Non-medical: No  Tobacco Use   Smoking status: Former Smoker    Packs/day: 1.50    Years: 55.00    Pack years: 82.50    Types: Cigarettes    Quit date: 08/13/2011    Years since quitting: 7.8   Smokeless tobacco: Never Used  Substance and Sexual Activity   Alcohol use: Yes    Comment: occasionally   Drug use: No   Sexual activity: Not on file  Lifestyle   Physical activity    Days per week: 6 days    Minutes per session: 50 min   Stress: Not at all  Relationships   Social connections    Talks on phone: Patient refused    Gets together: Patient refused    Attends religious service: Patient refused    Active member of club or organization: Patient refused    Attends meetings of clubs or organizations: Patient refused    Relationship status: Patient refused   Intimate partner violence    Fear of current or ex partner:  Patient refused    Emotionally abused: Patient refused    Physically abused: Patient refused    Forced sexual activity: Patient refused  Other Topics Concern   Not on file  Social History Narrative   Not on file    Past Medical History:  Diagnosis Date   Anemia    Cancer ([place])    [place]   Cataract    Helicobacter pylori ab+    Hyperlipidemia    Hypertension    Psoriasis      Patient Active Problem List   Diagnosis Date Noted   Obstructive sleep apnea 05/08/2017   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   Current Outpatient Medications:    aspirin 81 MG tablet, Take 81 mg by mouth daily. , Disp: , Rfl:    atorvastatin (LIPITOR) 40 MG tablet, TAKE ONE TABLET BY MOUTH EVERY EVENING, Disp: 90 tablet, Rfl: 3   carvedilol (COREG) 12.5 MG tablet, Take 12.5 mg by mouth 2 (two) times daily with a meal. , Disp: , Rfl:    Fluticasone-Umeclidin-Vilant (TRELEGY ELLIPTA) 100-62.5-25 MCG/INH AEPB, Inhale 1 puff into the lungs daily., Disp: , Rfl:    folic acid (FOLVITE) 1 MG tablet, Take 1 mg by mouth daily. Unsure dose, Disp: , Rfl:    hydrochlorothiazide (HYDRODIURIL) 25 MG tablet, Take 1 tablet (25 mg total) by mouth  daily., Disp: 90 tablet, Rfl: 1   methotrexate (RHEUMATREX) 2.5 MG tablet, Take 10 mg by mouth once a week. On Friday, Disp: , Rfl: 2   OXYGEN, Inhale 2 L/hr into the lungs. , Disp: , Rfl:    colchicine 0.6 MG tablet, Take 1 tablet (0.6 mg total) by mouth 2 (two) times daily. (Patient not taking: Reported on 07/01/2019), Disp: 60 tablet, Rfl: 5   fluticasone (FLONASE) 50 MCG/ACT nasal spray, Place 2 sprays into both nostrils daily. (Patient not taking: Reported on 07/01/2019), Disp: 16 g, Rfl: 6   indomethacin (INDOCIN) 50 MG capsule, Take 1 capsule (50 mg total) by mouth 2 (two) times daily as needed. (Patient not taking: Reported on 07/01/2019), Disp: 60 capsule, Rfl: 0  Patient Care Team: Jerrol Banana., MD as PCP - General (Family Medicine) Bari Mantis, MD as Referring Physician (Internal Medicine) Beverly Gust, MD as Consulting Physician (Otolaryngology) Tilda Franco, MD as Referring Physician (Cardiology) Dasher, Rayvon Char, MD (Dermatology)     Objective:     Vitals: BP 108/64 (BP Location: Right Arm)  Pulse 68  Temp 97.1 F (36.2 C) (Oral)  Ht 6' (1.829 m)  Wt 191 lb 12.8 oz (87 kg)  SpO2 95% BMI 26.01 kg/m  Physical Exam  Activities of Daily Living In your present state of health, do you have any difficulty performing the following activities: 07/01/2019  Hearing? N  Comment Wears bilateral hearing aids.  Vision? N  Comment Wears eye glasses.  Difficulty concentrating or making decisions? N  Walking or climbing stairs? N  Comment Will get SOB when climbing stairs.  Dressing or bathing? N  Doing errands, shopping? N  Preparing Food and eating ? N  Using the Toilet? N  In the past six months, have you accidently leaked urine? N  Do you have problems with loss of bowel control? N  Managing your Medications? N  Managing your Finances? N  Housekeeping or managing your Housekeeping? N  Some recent data might be hidden    Fall Risk Assessment Fall  Risk  07/01/2019 06/24/2018 05/02/2017 04/30/2016 05/11/2015  Falls in the past year? 0 0 No No No  Number falls in past yr: 0 - - - -  Injury with Fall? 0 - - - -     Depression Screen PHQ 2/9 Scores 07/01/2019 06/24/2018 05/02/2017 04/30/2016  PHQ - 2 Score 0 1 0 0  PHQ- 9 Score - - - -    6CIT Screen 06/24/2018  What Year? 0 points  What month? 0 points  What time? 0 points  Count back from 20 0 points  Months in reverse 0 points  Repeat phrase 0 points  Total Score 0       Assessment & Plan:    Annual Physical Reviewed patient's Family Medical History Reviewed and updated list of patient's medical providers Assessment of cognitive impairment was done Assessed patient's functional ability Established a written schedule for health screening services Health Risk Assessent Completed and Reviewed  Exercise Activities and Dietary recommendations Goals     Increase water intake     Recommend increasing water intake to 4 glasses of water a day.    06/24/18- Continue to cut back on sweet tea and increase water intake to 4-6 glasses a day.        Immunization History  Administered Date(s) Administered   Influenza, High Dose Seasonal PF 04/30/2016, 05/05/2018, 04/23/2019   Influenza-Unspecified 06/12/2017   Pneumococcal Conjugate-13 07/19/2013, 11/10/2014   Pneumococcal Polysaccharide-23 04/30/2016   Tdap 11/05/2011, 03/19/2012   Zoster 11/05/2011    Health Maintenance  Topic Date Due   TETANUS/TDAP  03/19/2022   INFLUENZA VACCINE  Completed   PNA vac Low Risk Adult  Completed     Discussed health benefits of physical activity, and encouraged him to engage in regular exercise appropriate for his age and condition.    --------------------------------------------------------------------    Wilhemena Durie, MD  [place]

## 2019-07-01 NOTE — Patient Instructions (Signed)
Mr. Missel , Thank you for taking time to come for your Medicare Wellness Visit. I appreciate your ongoing commitment to your health goals. Please review the following plan we discussed and let me know if I can assist you in the future.   Screening recommendations/referrals: Colonoscopy: No longer required.  Recommended yearly ophthalmology/optometry visit for glaucoma screening and checkup Recommended yearly dental visit for hygiene and checkup  Vaccinations: Influenza vaccine: Up to date Pneumococcal vaccine: Completed series Tdap vaccine: Up to date, due 03/2022 Shingles vaccine: Pt declines today.     Advanced directives: Please bring a copy of your POA (Power of Attorney) and/or Living Will to your next appointment.   Conditions/risks identified: Recommend to increase water intake to 6-8 8 oz glasses a day.   Next appointment: 9:40 AM today with Dr Rosanna Randy.   Preventive Care 77 Years and Older, Male Preventive care refers to lifestyle choices and visits with your health care provider that can promote health and wellness. What does preventive care include?  A yearly physical exam. This is also called an annual well check.  Dental exams once or twice a year.  Routine eye exams. Ask your health care provider how often you should have your eyes checked.  Personal lifestyle choices, including:  Daily care of your teeth and gums.  Regular physical activity.  Eating a healthy diet.  Avoiding tobacco and drug use.  Limiting alcohol use.  Practicing safe sex.  Taking low doses of aspirin every day.  Taking vitamin and mineral supplements as recommended by your health care provider. What happens during an annual well check? The services and screenings done by your health care provider during your annual well check will depend on your age, overall health, lifestyle risk factors, and family history of disease. Counseling  Your health care provider may ask you questions about  your:  Alcohol use.  Tobacco use.  Drug use.  Emotional well-being.  Home and relationship well-being.  Sexual activity.  Eating habits.  History of falls.  Memory and ability to understand (cognition).  Work and work Statistician. Screening  You may have the following tests or measurements:  Height, weight, and BMI.  Blood pressure.  Lipid and cholesterol levels. These may be checked every 5 years, or more frequently if you are over 55 years old.  Skin check.  Lung cancer screening. You may have this screening every year starting at age 67 if you have a 30-pack-year history of smoking and currently smoke or have quit within the past 15 years.  Fecal occult blood test (FOBT) of the stool. You may have this test every year starting at age 29.  Flexible sigmoidoscopy or colonoscopy. You may have a sigmoidoscopy every 5 years or a colonoscopy every 10 years starting at age 57.  Prostate cancer screening. Recommendations will vary depending on your family history and other risks.  Hepatitis C blood test.  Hepatitis B blood test.  Sexually transmitted disease (STD) testing.  Diabetes screening. This is done by checking your blood sugar (glucose) after you have not eaten for a while (fasting). You may have this done every 1-3 years.  Abdominal aortic aneurysm (AAA) screening. You may need this if you are a current or former smoker.  Osteoporosis. You may be screened starting at age 21 if you are at high risk. Talk with your health care provider about your test results, treatment options, and if necessary, the need for more tests. Vaccines  Your health care provider may recommend  certain vaccines, such as:  Influenza vaccine. This is recommended every year.  Tetanus, diphtheria, and acellular pertussis (Tdap, Td) vaccine. You may need a Td booster every 10 years.  Zoster vaccine. You may need this after age 40.  Pneumococcal 13-valent conjugate (PCV13) vaccine.  One dose is recommended after age 73.  Pneumococcal polysaccharide (PPSV23) vaccine. One dose is recommended after age 47. Talk to your health care provider about which screenings and vaccines you need and how often you need them. This information is not intended to replace advice given to you by your health care provider. Make sure you discuss any questions you have with your health care provider. Document Released: 08/25/2015 Document Revised: 04/17/2016 Document Reviewed: 05/30/2015 Elsevier Interactive Patient Education  2017 [place] Prevention in the Home Falls can cause injuries. They can happen to people of all ages. There are many things you can do to make your home safe and to help prevent falls. What can I do on the outside of my home?  Regularly fix the edges of walkways and driveways and fix any cracks.  Remove anything that might make you trip as you walk through a door, such as a raised step or threshold.  Trim any bushes or trees on the path to your home.  Use bright outdoor lighting.  Clear any walking paths of anything that might make someone trip, such as rocks or tools.  Regularly check to see if handrails are loose or broken. Make sure that both sides of any steps have handrails.  Any raised decks and porches should have guardrails on the edges.  Have any leaves, snow, or ice cleared regularly.  Use sand or salt on walking paths during winter.  Clean up any spills in your garage right away. This includes oil or grease spills. What can I do in the bathroom?  Use night lights.  Install grab bars by the toilet and in the tub and shower. Do not use towel bars as grab bars.  Use non-skid mats or decals in the tub or shower.  If you need to sit down in the shower, use a plastic, non-slip stool.  Keep the floor dry. Clean up any water that spills on the floor as soon as it happens.  Remove soap buildup in the tub or shower regularly.  Attach bath  mats securely with double-sided non-slip rug tape.  Do not have throw rugs and other things on the floor that can make you trip. What can I do in the bedroom?  Use night lights.  Make sure that you have a light by your bed that is easy to reach.  Do not use any sheets or blankets that are too big for your bed. They should not hang down onto the floor.  Have a firm chair that has side arms. You can use this for support while you get dressed.  Do not have throw rugs and other things on the floor that can make you trip. What can I do in the kitchen?  Clean up any spills right away.  Avoid walking on wet floors.  Keep items that you use a lot in easy-to-reach places.  If you need to reach something above you, use a strong step stool that has a grab bar.  Keep electrical cords out of the way.  Do not use floor polish or wax that makes floors slippery. If you must use wax, use non-skid floor wax.  Do not have throw rugs and other  things on the floor that can make you trip. What can I do with my stairs?  Do not leave any items on the stairs.  Make sure that there are handrails on both sides of the stairs and use them. Fix handrails that are broken or loose. Make sure that handrails are as long as the stairways.  Check any carpeting to make sure that it is firmly attached to the stairs. Fix any carpet that is loose or worn.  Avoid having throw rugs at the top or bottom of the stairs. If you do have throw rugs, attach them to the floor with carpet tape.  Make sure that you have a light switch at the top of the stairs and the bottom of the stairs. If you do not have them, ask someone to add them for you. What else can I do to help prevent falls?  Wear shoes that:  Do not have high heels.  Have rubber bottoms.  Are comfortable and fit you well.  Are closed at the toe. Do not wear sandals.  If you use a stepladder:  Make sure that it is fully opened. Do not climb a closed  stepladder.  Make sure that both sides of the stepladder are locked into place.  Ask someone to hold it for you, if possible.  Clearly mark and make sure that you can see:  Any grab bars or handrails.  First and last steps.  Where the edge of each step is.  Use tools that help you move around (mobility aids) if they are needed. These include:  Canes.  Walkers.  Scooters.  Crutches.  Turn on the lights when you go into a dark area. Replace any light bulbs as soon as they burn out.  Set up your furniture so you have a clear path. Avoid moving your furniture around.  If any of your floors are uneven, fix them.  If there are any pets around you, be aware of where they are.  Review your medicines with your doctor. Some medicines can make you feel dizzy. This can increase your chance of falling. Ask your doctor what other things that you can do to help prevent falls. This information is not intended to replace advice given to you by your health care provider. Make sure you discuss any questions you have with your health care provider. Document Released: 05/25/2009 Document Revised: 01/04/2016 Document Reviewed: 09/02/2014 Elsevier Interactive Patient Education  2017 [employer].

## 2019-07-01 NOTE — Patient Instructions (Signed)
Essential hypertension Stop HCTZ and follow up in 2 months.

## 2019-07-01 NOTE — Progress Notes (Signed)
Patient: Gregory Haynes, Male    DOB: Jun 03, 1941, 78 y.o.   MRN: [id number] Visit Date: 07/01/2019  Today's Provider: Wilhemena Durie, MD   Chief Complaint  Patient presents with  . Annual Exam   Subjective:     Patient had AWV with [facility] today at 9:00 am.   Complete Physical Gregory Haynes is a 78 y.o. male. He feels well. He reports exercising yes/walking. He reports he is sleeping fairly well.  ----------------------------------------------------------- Colonoscopy: 11/19/2012  Review of Systems  Constitutional: Negative.   HENT: Negative.   Respiratory: Negative.   Cardiovascular: Negative.   Gastrointestinal: Negative.   Endocrine: Negative.   Genitourinary: Negative.   Musculoskeletal: Negative.   Skin: Negative.   Allergic/Immunologic: Negative.   Neurological: Negative.   Hematological: Negative.   Psychiatric/Behavioral: Negative.     Social History   Socioeconomic History  . Marital status: Married    Spouse name: Patsy  . Number of children: 2  . Years of education: Not on file  . Highest education level: Some college, no degree  Occupational History  . Occupation: retired  Scientific laboratory technician  . Financial resource strain: Not hard at all  . Food insecurity    Worry: Never true    Inability: Never true  . Transportation needs    Medical: No    Non-medical: No  Tobacco Use  . Smoking status: Former Smoker    Packs/day: 1.50    Years: 55.00    Pack years: 82.50    Types: Cigarettes    Quit date: 08/13/2011    Years since quitting: 7.8  . Smokeless tobacco: Never Used  Substance and Sexual Activity  . Alcohol use: Yes    Comment: occasionally  . Drug use: No  . Sexual activity: Not on file  Lifestyle  . Physical activity    Days per week: 6 days    Minutes per session: 50 min  . Stress: Not at all  Relationships  . Social Herbalist on phone: Patient refused    Gets together: Patient refused    Attends religious  service: Patient refused    Active member of club or organization: Patient refused    Attends meetings of clubs or organizations: Patient refused    Relationship status: Patient refused  . Intimate partner violence    Fear of current or ex partner: Patient refused    Emotionally abused: Patient refused    Physically abused: Patient refused    Forced sexual activity: Patient refused  Other Topics Concern  . Not on file  Social History Narrative  . Not on file    Past Medical History:  Diagnosis Date  . Anemia   . Cancer ([place])    [place]  . Cataract   . Helicobacter pylori ab+   . Hyperlipidemia   . Hypertension   . Psoriasis      Patient Active Problem List   Diagnosis Date Noted  . Obstructive sleep apnea 05/08/2017  . Psoriatic arthritis ([place]) 10/31/2015  . 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 Esophagitis, reflux 05/10/2015  . BP (high blood pressure) 05/10/2015  . Positive H. pylori test 05/10/2015  . 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 Psoriasis 05/10/2015  . 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 Respiratory failure with hypercapnia ([place]) 03/07/2012  . Fam hx-ischem heart disease 03/23/2010  .  Hypercholesteremia 03/23/2010  . Current tobacco use 03/23/2010    Past Surgical History:  Procedure Laterality Date  . NO PAST SURGERIES      His family history includes Alcohol abuse in his brother; Heart attack in his father; Heart disease in his brother; Hyperlipidemia in his brother.   Current Outpatient Medications:  .  aspirin 81 MG tablet, Take 81 mg by mouth daily. , Disp: , Rfl:  .  atorvastatin (LIPITOR) 40 MG tablet, TAKE ONE TABLET BY MOUTH EVERY EVENING, Disp: 90 tablet, Rfl: 3 .  carvedilol (COREG) 12.5 MG tablet, Take 12.5 mg by mouth 2 (two) times daily with a meal. , Disp: , Rfl:  .  Fluticasone-Umeclidin-Vilant (TRELEGY ELLIPTA)  100-62.5-25 MCG/INH AEPB, Inhale 1 puff into the lungs daily., Disp: , Rfl:  .  folic acid (FOLVITE) 1 MG tablet, Take 1 mg by mouth daily. Unsure dose, Disp: , Rfl:  .  hydrochlorothiazide (HYDRODIURIL) 25 MG tablet, Take 1 tablet (25 mg total) by mouth daily., Disp: 90 tablet, Rfl: 1 .  methotrexate (RHEUMATREX) 2.5 MG tablet, Take 10 mg by mouth once a week. On Friday, Disp: , Rfl: 2 .  OXYGEN, Inhale 2 L/hr into the lungs. , Disp: , Rfl:  .  colchicine 0.6 MG tablet, Take 1 tablet (0.6 mg total) by mouth 2 (two) times daily. (Patient not taking: Reported on 07/01/2019), Disp: 60 tablet, Rfl: 5 .  fluticasone (FLONASE) 50 MCG/ACT nasal spray, Place 2 sprays into both nostrils daily. (Patient not taking: Reported on 07/01/2019), Disp: 16 g, Rfl: 6 .  indomethacin (INDOCIN) 50 MG capsule, Take 1 capsule (50 mg total) by mouth 2 (two) times daily as needed. (Patient not taking: Reported on 07/01/2019), Disp: 60 capsule, Rfl: 0  Patient Care Team: Jerrol Banana., MD as PCP - General (Family Medicine) Bari Mantis, MD as Referring Physician (Internal Medicine) Beverly Gust, MD as Consulting Physician (Otolaryngology) Tilda Franco, MD as Referring Physician (Cardiology) Dasher, Rayvon Char, MD (Dermatology)     Objective:    Vitals:   BP 108/64 (BP Location: Right Arm)  Pulse 68  Temp 97.1 F (36.2 C) (Oral)  Ht 6' (1.829 m)  Wt 191 lb 12.8 oz (87 kg)  SpO2 95%  BMI 26.01 kg/m  Physical Exam Vitals signs reviewed.  Constitutional:      Appearance: He is well-developed.  HENT:     Head: Normocephalic and atraumatic.  Eyes:     General: No scleral icterus.    Conjunctiva/sclera: Conjunctivae normal.  Neck:     Thyroid: No thyromegaly.  Cardiovascular:     Rate and Rhythm: Normal rate and regular rhythm.     Heart sounds: Normal heart sounds.  Pulmonary:     Effort: Pulmonary effort is normal.     Breath sounds: Normal breath sounds.  Abdominal:     Palpations:  Abdomen is soft.  Musculoskeletal:     Right lower leg: No edema.     Left lower leg: No edema.  Skin:    General: Skin is warm and dry.  Neurological:     General: No focal deficit present.     Mental Status: He is alert and oriented to person, place, and time.  Psychiatric:        Mood and Affect: Mood normal.        Behavior: Behavior normal.        Thought Content: Thought content normal.        Judgment: Judgment normal.  Activities of Daily Living In your present state of health, do you have any difficulty performing the following activities: 07/01/2019  Hearing? N  Comment Wears bilateral hearing aids.  Vision? N  Comment Wears eye glasses.  Difficulty concentrating or making decisions? N  Walking or climbing stairs? N  Comment Will get SOB when climbing stairs.  Dressing or bathing? N  Doing errands, shopping? N  Preparing Food and eating ? N  Using the Toilet? N  In the past six months, have you accidently leaked urine? N  Do you have problems with loss of bowel control? N  Managing your Medications? N  Managing your Finances? N  Housekeeping or managing your Housekeeping? N  Some recent data might be hidden    Fall Risk Assessment Fall Risk  07/01/2019 06/24/2018 05/02/2017 04/30/2016 05/11/2015  Falls in the past year? 0 0 No No No  Number falls in past yr: 0 - - - -  Injury with Fall? 0 - - - -     Depression Screen PHQ 2/9 Scores 07/01/2019 06/24/2018 05/02/2017 04/30/2016  PHQ - 2 Score 0 1 0 0  PHQ- 9 Score - - - -    6CIT Screen 06/24/2018  What Year? 0 points  What month? 0 points  What time? 0 points  Count back from 20 0 points  Months in reverse 0 points  Repeat phrase 0 points  Total Score 0       Assessment & Plan:    Annual Physical Reviewed patient's Family Medical History Reviewed and updated list of patient's medical providers Assessment of cognitive impairment was done Assessed patient's functional ability Established a  written schedule for health screening [place] Completed and Reviewed  Exercise Activities and Dietary recommendations Goals    . Increase water intake     Recommend increasing water intake to 4 glasses of water a day.    06/24/18- Continue to cut back on sweet tea and increase water intake to 4-6 glasses a day.        Immunization History  Administered Date(s) Administered  . Influenza, High Dose Seasonal PF 04/30/2016, 05/05/2018, 04/23/2019  . Influenza-Unspecified 06/12/2017  . Pneumococcal Conjugate-13 07/19/2013, 11/10/2014  . Pneumococcal Polysaccharide-23 04/30/2016  . Tdap 11/05/2011, 03/19/2012  . Zoster 11/05/2011    Health Maintenance  Topic Date Due  . Samul Dada  03/19/2022  . INFLUENZA VACCINE  Completed  . PNA vac Low Risk Adult  Completed     Discussed health benefits of physical activity, and encouraged him to engage in regular exercise appropriate for his age and condition.    -----------------------------------------------------------------  1. Annual physical exam   2. Essential hypertension Stop HCTZ and follow up in 2 months.  3.COPD  Wilhemena Durie, MD  [facility]

## 2019-07-06 DIAGNOSIS — J449 Chronic obstructive pulmonary disease, unspecified: Secondary | ICD-10-CM | POA: Diagnosis not present

## 2019-07-15 DIAGNOSIS — Z87891 Personal history of nicotine dependence: Secondary | ICD-10-CM | POA: Diagnosis not present

## 2019-07-15 DIAGNOSIS — J449 Chronic obstructive pulmonary disease, unspecified: Secondary | ICD-10-CM | POA: Diagnosis not present

## 2019-07-15 DIAGNOSIS — Z7982 Long term (current) use of aspirin: Secondary | ICD-10-CM | POA: Diagnosis not present

## 2019-07-15 DIAGNOSIS — Z833 Family history of diabetes mellitus: Secondary | ICD-10-CM | POA: Diagnosis not present

## 2019-07-15 DIAGNOSIS — L409 Psoriasis, unspecified: Secondary | ICD-10-CM | POA: Diagnosis not present

## 2019-07-15 DIAGNOSIS — E785 Hyperlipidemia, unspecified: Secondary | ICD-10-CM | POA: Diagnosis not present

## 2019-07-15 DIAGNOSIS — Z7951 Long term (current) use of inhaled steroids: Secondary | ICD-10-CM | POA: Diagnosis not present

## 2019-07-15 DIAGNOSIS — I10 Essential (primary) hypertension: Secondary | ICD-10-CM | POA: Diagnosis not present

## 2019-07-15 DIAGNOSIS — Z8249 Family history of ischemic heart disease and other diseases of the circulatory system: Secondary | ICD-10-CM | POA: Diagnosis not present

## 2019-07-15 DIAGNOSIS — Z85828 Personal history of other malignant neoplasm of skin: Secondary | ICD-10-CM | POA: Diagnosis not present

## 2019-07-22 DIAGNOSIS — J449 Chronic obstructive pulmonary disease, unspecified: Secondary | ICD-10-CM | POA: Diagnosis not present

## 2019-07-30 DIAGNOSIS — H2512 Age-related nuclear cataract, left eye: Secondary | ICD-10-CM | POA: Diagnosis not present

## 2019-07-30 DIAGNOSIS — J449 Chronic obstructive pulmonary disease, unspecified: Secondary | ICD-10-CM | POA: Diagnosis not present

## 2019-08-09 ENCOUNTER — Encounter: Payer: Self-pay | Admitting: Ophthalmology

## 2019-08-09 ENCOUNTER — Other Ambulatory Visit: Payer: Self-pay

## 2019-08-10 ENCOUNTER — Other Ambulatory Visit: Payer: Self-pay

## 2019-08-10 ENCOUNTER — Telehealth: Payer: Self-pay | Admitting: Family Medicine

## 2019-08-10 MED ORDER — CARVEDILOL 12.5 MG PO TABS: 12.5000 mg | ORAL_TABLET | Freq: Two times a day (BID) | ORAL | 3 refills | Status: DC

## 2019-08-10 NOTE — Telephone Encounter (Signed)
From [facility] 

## 2019-08-10 NOTE — Telephone Encounter (Signed)
I have no idea who gave him the instructions regarding his medication.  Sorry.

## 2019-08-10 NOTE — Telephone Encounter (Signed)
Thank you :)

## 2019-08-10 NOTE — Telephone Encounter (Signed)
It appears you did on his last visit.  I had not checked the chart ans this was a message coming from [facility].  I thought I sent it to your nurse pool for it to be checked.  Sorry.  I have taken care of it and sent in the Rx for the correct medication.

## 2019-08-10 NOTE — Telephone Encounter (Signed)
Pt thought he was told to stop taking his carvedilol (COREG) 12.5 MG tablet  And to continue taking his hydrochlorothiazide (HYDRODIURIL) 25 MG tablet But when the nurse called him to prepare for surgery she advised him that his chart stated to stop taking hydrochlorothiazide (HYDRODIURIL) 25 MG tablet And to continue taking arvedilol (COREG) 12.5 MG tablet /  They would like to know asap what the actual directions should be and which medication he should stop taking so he is prepared for his upcoming surgery / please call Patsy(wife) and advise

## 2019-08-16 ENCOUNTER — Other Ambulatory Visit
Admission: RE | Admit: 2019-08-16 | Discharge: 2019-08-16 | Disposition: A | Discharge status: Home / Self Care | Payer: Medicare HMO | Source: Ambulatory Visit | Attending: Ophthalmology | Admitting: Ophthalmology

## 2019-08-16 ENCOUNTER — Other Ambulatory Visit: Payer: Self-pay

## 2019-08-16 DIAGNOSIS — Z01812 Encounter for preprocedural laboratory examination: Secondary | ICD-10-CM | POA: Insufficient documentation

## 2019-08-16 DIAGNOSIS — Z20822 Contact with and (suspected) exposure to covid-19: Secondary | ICD-10-CM | POA: Insufficient documentation

## 2019-08-16 LAB — SARS CORONAVIRUS 2 (TAT 6-24 HRS): SARS Coronavirus 2: NEGATIVE

## 2019-08-16 NOTE — Discharge Instructions (Signed)

## 2019-08-18 ENCOUNTER — Encounter
Admission: RE | Disposition: A | Discharge status: Home / Self Care | Payer: Self-pay | Source: Home / Self Care | Attending: Ophthalmology

## 2019-08-18 ENCOUNTER — Ambulatory Visit
Admission: RE | Admit: 2019-08-18 | Discharge: 2019-08-18 | Disposition: A | Discharge status: Home / Self Care | Payer: Medicare HMO | Attending: Ophthalmology | Admitting: Ophthalmology

## 2019-08-18 ENCOUNTER — Ambulatory Visit: Payer: Medicare HMO | Admitting: Anesthesiology

## 2019-08-18 ENCOUNTER — Encounter: Payer: Self-pay | Admitting: Ophthalmology

## 2019-08-18 ENCOUNTER — Other Ambulatory Visit: Payer: Self-pay

## 2019-08-18 DIAGNOSIS — H25812 Combined forms of age-related cataract, left eye: Secondary | ICD-10-CM | POA: Diagnosis not present

## 2019-08-18 DIAGNOSIS — Z87891 Personal history of nicotine dependence: Secondary | ICD-10-CM | POA: Insufficient documentation

## 2019-08-18 DIAGNOSIS — Z9981 Dependence on supplemental oxygen: Secondary | ICD-10-CM | POA: Insufficient documentation

## 2019-08-18 DIAGNOSIS — G4730 Sleep apnea, unspecified: Secondary | ICD-10-CM | POA: Diagnosis not present

## 2019-08-18 DIAGNOSIS — I10 Essential (primary) hypertension: Secondary | ICD-10-CM | POA: Insufficient documentation

## 2019-08-18 DIAGNOSIS — Z7982 Long term (current) use of aspirin: Secondary | ICD-10-CM | POA: Insufficient documentation

## 2019-08-18 DIAGNOSIS — Z79899 Other long term (current) drug therapy: Secondary | ICD-10-CM | POA: Insufficient documentation

## 2019-08-18 DIAGNOSIS — H25042 Posterior subcapsular polar age-related cataract, left eye: Secondary | ICD-10-CM | POA: Diagnosis not present

## 2019-08-18 DIAGNOSIS — E7800 Pure hypercholesterolemia, unspecified: Secondary | ICD-10-CM | POA: Diagnosis not present

## 2019-08-18 DIAGNOSIS — J449 Chronic obstructive pulmonary disease, unspecified: Secondary | ICD-10-CM | POA: Insufficient documentation

## 2019-08-18 DIAGNOSIS — H2512 Age-related nuclear cataract, left eye: Secondary | ICD-10-CM | POA: Diagnosis not present

## 2019-08-18 HISTORY — PX: CATARACT EXTRACTION W/PHACO: SHX586

## 2019-08-18 HISTORY — DX: Presence of external hearing-aid: Z97.4

## 2019-08-18 HISTORY — DX: Chronic obstructive pulmonary disease, unspecified: J44.9

## 2019-08-18 SURGERY — PHACOEMULSIFICATION, CATARACT, WITH IOL INSERTION
Anesthesia: Monitor Anesthesia Care | Laterality: Left

## 2019-08-18 MED ORDER — ARMC OPHTHALMIC DILATING DROPS
1.0000 "application " | OPHTHALMIC | Status: DC | PRN
  Administered 2019-08-18 (×3): 1 via OPHTHALMIC

## 2019-08-18 MED ORDER — EPINEPHRINE PF 1 MG/ML IJ SOLN
INTRAOCULAR | Status: DC | PRN
  Administered 2019-08-18: 12:00:00 60 mL via OPHTHALMIC

## 2019-08-18 MED ORDER — BRIMONIDINE TARTRATE-TIMOLOL 0.2-0.5 % OP SOLN
OPHTHALMIC | Status: DC | PRN
  Administered 2019-08-18: 1 [drp] via OPHTHALMIC

## 2019-08-18 MED ORDER — TETRACAINE HCL 0.5 % OP SOLN
1.0000 [drp] | OPHTHALMIC | Status: DC | PRN
  Administered 2019-08-18 (×3): 1 [drp] via OPHTHALMIC

## 2019-08-18 MED ORDER — MOXIFLOXACIN HCL 0.5 % OP SOLN
1.0000 [drp] | OPHTHALMIC | Status: DC | PRN
  Administered 2019-08-18 (×3): 1 [drp] via OPHTHALMIC

## 2019-08-18 MED ORDER — CEFUROXIME OPHTHALMIC INJECTION 1 MG/0.1 ML
INJECTION | OPHTHALMIC | Status: DC | PRN
  Administered 2019-08-18: 0.1 mL via INTRACAMERAL

## 2019-08-18 MED ORDER — ACETAMINOPHEN 160 MG/5ML PO SOLN: 325.0000 mg | ORAL | Status: DC | PRN

## 2019-08-18 MED ORDER — FENTANYL CITRATE (PF) 100 MCG/2ML IJ SOLN
INTRAMUSCULAR | Status: DC | PRN
  Administered 2019-08-18: 50 ug via INTRAVENOUS

## 2019-08-18 MED ORDER — ONDANSETRON HCL 4 MG/2ML IJ SOLN: 4.0000 mg | Freq: Once | INTRAMUSCULAR | Status: DC | PRN

## 2019-08-18 MED ORDER — LIDOCAINE HCL (PF) 2 % IJ SOLN
INTRAOCULAR | Status: DC | PRN
  Administered 2019-08-18: 2 mL

## 2019-08-18 MED ORDER — NA HYALUR & NA CHOND-NA HYALUR 0.4-0.35 ML IO KIT
PACK | INTRAOCULAR | Status: DC | PRN
  Administered 2019-08-18: 1 mL via INTRAOCULAR

## 2019-08-18 MED ORDER — MIDAZOLAM HCL 2 MG/2ML IJ SOLN
INTRAMUSCULAR | Status: DC | PRN
  Administered 2019-08-18: 1 mg via INTRAVENOUS

## 2019-08-18 MED ORDER — ACETAMINOPHEN 325 MG PO TABS: 325.0000 mg | ORAL_TABLET | ORAL | Status: DC | PRN

## 2019-08-18 SURGICAL SUPPLY — 16 items
CANNULA ANT/CHMB 27G (MISCELLANEOUS) ×1 IMPLANT
CANNULA ANT/CHMB 27GA (MISCELLANEOUS) ×2 IMPLANT
GLOVE SURG LX 7.5 STRW (GLOVE) ×1
GLOVE SURG LX STRL 7.5 STRW (GLOVE) ×1 IMPLANT
GLOVE SURG TRIUMPH 8.0 PF LTX (GLOVE) ×2 IMPLANT
GOWN STRL REUS W/ TWL LRG LVL3 (GOWN DISPOSABLE) ×2 IMPLANT
GOWN STRL REUS W/TWL LRG LVL3 (GOWN DISPOSABLE) ×2
LENS IOL TECNIS ITEC 20.0 (Intraocular Lens) ×1 IMPLANT
MARKER SKIN DUAL TIP RULER LAB (MISCELLANEOUS) ×2 IMPLANT
PACK CATARACT BRASINGTON (MISCELLANEOUS) ×2 IMPLANT
PACK EYE AFTER SURG (MISCELLANEOUS) ×2 IMPLANT
PACK OPTHALMIC (MISCELLANEOUS) ×2 IMPLANT
SYR 3ML LL SCALE MARK (SYRINGE) ×2 IMPLANT
SYR TB 1ML LUER SLIP (SYRINGE) ×2 IMPLANT
WATER STERILE IRR 500ML POUR (IV SOLUTION) ×2 IMPLANT
WIPE NON LINTING 3.25X3.25 (MISCELLANEOUS) ×2 IMPLANT

## 2019-08-18 NOTE — Anesthesia Procedure Notes (Signed)
Procedure Name: MAC Performed by: Izetta Dakin, CRNA Pre-anesthesia Checklist: Timeout performed, Patient being monitored, Suction available, Emergency Drugs available and Patient identified Patient Re-evaluated:Patient Re-evaluated prior to induction Oxygen Delivery Method: Nasal cannula

## 2019-08-18 NOTE — H&P (Signed)

## 2019-08-18 NOTE — Op Note (Signed)
OPERATIVE NOTE  RODRIGO PEZZULLO [id number] 08/18/2019   PREOPERATIVE DIAGNOSIS:  Nuclear sclerotic cataract left eye. H25.12   POSTOPERATIVE DIAGNOSIS:    Nuclear sclerotic cataract left eye.     PROCEDURE:  Phacoemusification with posterior chamber intraocular lens placement of the left eye  Ultrasound time: Procedure(s): CATARACT EXTRACTION PHACO AND INTRAOCULAR LENS PLACEMENT (IOC) LEFT 6.68,   00:54.1,   30.8% (Left)  LENS:   Implant Name Type Inv. Item Serial No. Manufacturer Lot No. LRB No. Used Action  LENS IOL DIOP 20.0 - [id number] Intraocular Lens LENS IOL DIOP 20.0 [id number]  Left 1 Implanted      SURGEON:  Wyonia Hough, MD   ANESTHESIA:  Topical with tetracaine drops and 2% Xylocaine jelly, augmented with 1% preservative-free intracameral lidocaine.    COMPLICATIONS:  None.   DESCRIPTION OF PROCEDURE:  The patient was identified in the holding room and transported to the operating room and placed in the supine position under the operating microscope.  The left eye was identified as the operative eye and it was prepped and draped in the usual sterile ophthalmic fashion.   A 1 millimeter clear-corneal paracentesis was made at the 1:30 position.  0.5 ml of preservative-free 1% lidocaine was injected into the anterior chamber.  The anterior chamber was filled with Viscoat viscoelastic.  A 2.4 millimeter keratome was used to make a near-clear corneal incision at the 10:30 position.  .  A curvilinear capsulorrhexis was made with a cystotome and capsulorrhexis forceps.  Balanced salt solution was used to hydrodissect and hydrodelineate the nucleus.   Phacoemulsification was then used in stop and chop fashion to remove the lens nucleus and epinucleus.  The remaining cortex was then removed using the irrigation and aspiration handpiece. Provisc was then placed into the capsular bag to distend it for lens placement.  A lens was then injected into the capsular bag.  The  remaining viscoelastic was aspirated.   Wounds were hydrated with balanced salt solution.  The anterior chamber was inflated to a physiologic pressure with balanced salt solution.  No wound leaks were noted. Cefuroxime 0.1 ml of a 10mg /ml solution was injected into the anterior chamber for a dose of 1 mg of intracameral antibiotic at the completion of the case.   Timolol and Brimonidine drops were applied to the eye.  The patient was taken to the recovery room in stable condition without complications of anesthesia or surgery.  Edvardo Honse 08/18/2019, 12:10 PM

## 2019-08-18 NOTE — Anesthesia Postprocedure Evaluation (Addendum)
Anesthesia Post Note  Patient: Gregory Haynes  Procedure(s) Performed: CATARACT EXTRACTION PHACO AND INTRAOCULAR LENS PLACEMENT (IOC) LEFT 6.68,   00:54.1,   30.8% (Left )     Patient location during evaluation: Phase II Anesthesia Type: MAC Level of consciousness: awake and alert and patient cooperative Pain management: pain level controlled Vital Signs Assessment: post-procedure vital signs reviewed and stable Respiratory status: spontaneous breathing, nonlabored ventilation and respiratory function stable Cardiovascular status: blood pressure returned to baseline and stable Postop Assessment: no headache and no backache Anesthetic complications: no    Sinda Du

## 2019-08-18 NOTE — Transfer of Care (Signed)
Immediate Anesthesia Transfer of Care Note  Patient: Gregory Haynes  Procedure(s) Performed: CATARACT EXTRACTION PHACO AND INTRAOCULAR LENS PLACEMENT (IOC) LEFT 6.68,   00:54.1,   30.8% (Left )  Patient Location: PACU  Anesthesia Type: MAC  Level of Consciousness: awake, alert  and patient cooperative  Airway and Oxygen Therapy: Patient Spontanous Breathing and Patient connected to supplemental oxygen  Post-op Assessment: Post-op Vital signs reviewed, Patient's Cardiovascular Status Stable, Respiratory Function Stable, Patent Airway and No signs of Nausea or vomiting  Post-op Vital Signs: Reviewed and stable  Complications: No apparent anesthesia complications

## 2019-08-18 NOTE — Anesthesia Preprocedure Evaluation (Signed)
Anesthesia Evaluation  Patient identified by MRN, date of birth, ID band Patient awake    Reviewed: Allergy & Precautions, NPO status , Patient's Chart, lab work & pertinent test results  History of Anesthesia Complications Negative for: history of anesthetic complications  Airway Mallampati: II  TM Distance: >3 FB Neck ROM: Full    Dental no notable dental hx.    Pulmonary sleep apnea , COPD,  COPD inhaler and oxygen dependent, former smoker,    Pulmonary exam normal breath sounds clear to auscultation       Cardiovascular Exercise Tolerance: Good hypertension, Normal cardiovascular exam Rhythm:Regular Rate:Normal     Neuro/Psych  Neuromuscular disease negative psych ROS   GI/Hepatic negative GI ROS, Neg liver ROS,   Endo/Other  negative endocrine ROS  Renal/GU negative Renal ROS  negative genitourinary   Musculoskeletal  (+) Arthritis ,   Abdominal Normal abdominal exam  (+) - obese,   Peds negative pediatric ROS (+)  Hematology  (+) anemia ,   Anesthesia Other Findings   Reproductive/Obstetrics negative OB ROS                             Anesthesia Physical Anesthesia Plan  ASA: III  Anesthesia Plan: MAC   Post-op Pain Management:    Induction: Intravenous  PONV Risk Score and Plan: 1  Airway Management Planned: Natural Airway  Additional Equipment:   Intra-op Plan:   Post-operative Plan:   Informed Consent: I have reviewed the patients History and Physical, chart, labs and discussed the procedure including the risks, benefits and alternatives for the proposed anesthesia with the patient or authorized representative who has indicated his/her understanding and acceptance.     Dental advisory given  Plan Discussed with: Anesthesiologist and CRNA  Anesthesia Plan Comments:         Anesthesia Quick Evaluation  Patient Active Problem List   Diagnosis Date  Noted  . Obstructive sleep apnea 05/08/2017  . Psoriatic arthritis ([place]) 10/31/2015  . 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 Esophagitis, reflux 05/10/2015  . BP (high blood pressure) 05/10/2015  . Positive H. pylori test 05/10/2015  . 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 Psoriasis 05/10/2015  . 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 Respiratory failure with hypercapnia ([place]) 03/07/2012  . Fam hx-ischem heart disease 03/23/2010  . Hypercholesteremia 03/23/2010  . Current tobacco use 03/23/2010    CBC Latest Ref Rng & Units 06/18/2019 06/24/2018 12/16/2016  WBC 3.4 - 10.8 x10E3/uL 8.2 7.4 9.1  Hemoglobin 13.0 - 17.7 g/dL 14.1 13.3 13.6  Hematocrit 37.5 - 51.0 % 42.0 40.3 40.0  Platelets 150 - 450 x10E3/uL 253 309 272   BMP Latest Ref Rng & Units 06/18/2019 06/24/2018 10/09/2017  Glucose 65 - 99 mg/dL 97 79 -  BUN 8 - 27 mg/dL 21 14 -  Creatinine 0.76 - 1.27 mg/dL 1.22 1.03 1.00  BUN/Creat Ratio 10 - _0 -  Sodium 134 - 144 mmol/L 141 142 -  Potassium 3.5 - 5.2 mmol/L 4.0 3.9 -  Chloride 96 - 106 mmol/L 97 97 -  CO2 20 - 29 mmol/L 29 30(H) -  Calcium 8.6 - 10.2 mg/dL 9.7 9.3 -    Risks and benefits of anesthesia discussed at length, patient or surrogate demonstrates understanding. Appropriately NPO (coffee w/cream at 0530). Plan to proceed with anesthesia  at 1130.  Champ Mungo, MD 08/18/19

## 2019-08-19 ENCOUNTER — Encounter: Payer: Self-pay | Admitting: *Deleted

## 2019-08-22 DIAGNOSIS — J449 Chronic obstructive pulmonary disease, unspecified: Secondary | ICD-10-CM | POA: Diagnosis not present

## 2019-08-30 DIAGNOSIS — J449 Chronic obstructive pulmonary disease, unspecified: Secondary | ICD-10-CM | POA: Diagnosis not present

## 2019-08-30 DIAGNOSIS — Z803 Family history of malignant neoplasm of breast: Secondary | ICD-10-CM | POA: Diagnosis not present

## 2019-08-30 DIAGNOSIS — I10 Essential (primary) hypertension: Secondary | ICD-10-CM | POA: Diagnosis not present

## 2019-08-30 DIAGNOSIS — Z7982 Long term (current) use of aspirin: Secondary | ICD-10-CM | POA: Diagnosis not present

## 2019-08-30 DIAGNOSIS — Z8249 Family history of ischemic heart disease and other diseases of the circulatory system: Secondary | ICD-10-CM | POA: Diagnosis not present

## 2019-08-30 DIAGNOSIS — Z7951 Long term (current) use of inhaled steroids: Secondary | ICD-10-CM | POA: Diagnosis not present

## 2019-08-30 DIAGNOSIS — L409 Psoriasis, unspecified: Secondary | ICD-10-CM | POA: Diagnosis not present

## 2019-08-30 DIAGNOSIS — E785 Hyperlipidemia, unspecified: Secondary | ICD-10-CM | POA: Diagnosis not present

## 2019-08-30 DIAGNOSIS — Z79899 Other long term (current) drug therapy: Secondary | ICD-10-CM | POA: Diagnosis not present

## 2019-08-30 DIAGNOSIS — R69 Illness, unspecified: Secondary | ICD-10-CM | POA: Diagnosis not present

## 2019-08-31 ENCOUNTER — Ambulatory Visit: Payer: Self-pay | Admitting: Family Medicine

## 2019-09-08 NOTE — Progress Notes (Signed)
Patient: Gregory Haynes Male    DOB: [birth year]   79 y.o.   MRN: [id number] Visit Date: 09/09/2019  Today's Provider: Wilhemena Durie, MD   Chief Complaint  Patient presents with  . Follow-up  . Hypertension   Subjective:     HPI  Patient feels well and has no complaints.   Essential hypertension From 07/01/2019-advised patient to stop HCTZ and follow up in 2 months.   Patient states he is doing well off HCTZ. His bp's have been running fine at home.  No Known Allergies   Current Outpatient Medications:  .  aspirin 81 MG tablet, Take 81 mg by mouth daily. , Disp: , Rfl:  .  atorvastatin (LIPITOR) 40 MG tablet, TAKE ONE TABLET BY MOUTH EVERY EVENING, Disp: 90 tablet, Rfl: 3 .  carvedilol (COREG) 12.5 MG tablet, Take 1 tablet (12.5 mg total) by mouth 2 (two) times daily with a meal., Disp: 90 tablet, Rfl: 3 .  clobetasol cream (TEMOVATE) [id number] %, Apply 1 application topically 2 (two) times daily., Disp: , Rfl:  .  clotrimazole (MYCELEX) 10 MG troche, , Disp: , Rfl:  .  Fluticasone-Umeclidin-Vilant (TRELEGY ELLIPTA) 100-62.5-25 MCG/INH AEPB, Inhale 1 puff into the lungs daily., Disp: , Rfl:  .  folic acid (FOLVITE) 1 MG tablet, Take 1 mg by mouth daily. Unsure dose, Disp: , Rfl:  .  methotrexate (RHEUMATREX) 2.5 MG tablet, Take 10 mg by mouth once a week. On Friday, Disp: , Rfl: 2 .  OXYGEN, Inhale 2 L/hr into the lungs. At night and as needed during day., Disp: , Rfl:  .  hydrochlorothiazide (MICROZIDE) 12.5 MG capsule, , Disp: , Rfl:  .  methotrexate (RHEUMATREX) 2.5 MG tablet, Take 10 mg by mouth once a week., Disp: , Rfl:   Review of Systems  Constitutional: Negative for appetite change, chills and fever.  HENT: Negative.   Eyes: Negative.   Respiratory: Negative for chest tightness, shortness of breath and wheezing.   Cardiovascular: Negative for chest pain and palpitations.  Gastrointestinal: Negative for abdominal pain, nausea and vomiting.  Endocrine:  Negative.   Allergic/Immunologic: Negative.   Psychiatric/Behavioral: Negative.     Social History   Tobacco Use  . Smoking status: Former Smoker    Packs/day: 1.50    Years: 55.00    Pack years: 82.50    Types: Cigarettes    Quit date: 08/13/2011    Years since quitting: 8.0  . Smokeless tobacco: Never Used  Substance Use Topics  . Alcohol use: Yes    Comment: occasionally      Objective:   BP 123/74 (BP Location: Left Arm, Patient Position: Sitting, Cuff Size: Large)   Pulse 66   Temp (!) 97.5 F (36.4 C) (Other (Comment))   Resp 18   Ht 6' (1.829 m)   Wt 196 lb (88.9 kg)   SpO2 97%   BMI 26.58 kg/m  Vitals:   09/09/19 1433  BP: 123/74  Pulse: 66  Resp: 18  Temp: (!) 97.5 F (36.4 C)  TempSrc: Other (Comment)  SpO2: 97%  Weight: 196 lb (88.9 kg)  Height: 6' (1.829 m)  Body mass index is 26.58 kg/m.   Physical Exam Vitals reviewed.  Constitutional:      Appearance: He is well-developed.  HENT:     Head: Normocephalic and atraumatic.  Eyes:     General: No scleral icterus.    Conjunctiva/sclera: Conjunctivae normal.  Neck:  Thyroid: No thyromegaly.  Cardiovascular:     Rate and Rhythm: Normal rate and regular rhythm.     Heart sounds: Normal heart sounds.  Pulmonary:     Effort: Pulmonary effort is normal.     Breath sounds: Normal breath sounds.  Abdominal:     Palpations: Abdomen is soft.  Musculoskeletal:     Right lower leg: No edema.     Left lower leg: No edema.  Skin:    General: Skin is warm and dry.  Neurological:     General: No focal deficit present.     Mental Status: He is alert and oriented to person, place, and time.  Psychiatric:        Mood and Affect: Mood normal.        Behavior: Behavior normal.        Thought Content: Thought content normal.        Judgment: Judgment normal.      No results found for any visits on 09/09/19.     Assessment & Plan    1. Coronary artery disease of native artery of native heart  with stable angina pectoris ([place]) Risk factors treated  2. HTN, goal below 130/80 On Coreg and HCTZ Follow-up 6 months. 3. COPD, moderate ([place]) Followed by [facility]      Wilhemena Durie, MD  [facility]

## 2019-09-09 ENCOUNTER — Ambulatory Visit (INDEPENDENT_AMBULATORY_CARE_PROVIDER_SITE_OTHER): Payer: Medicare HMO | Admitting: Family Medicine

## 2019-09-09 ENCOUNTER — Encounter: Payer: Self-pay | Admitting: Family Medicine

## 2019-09-09 ENCOUNTER — Other Ambulatory Visit: Payer: Self-pay

## 2019-09-09 VITALS — BP 123/74 | HR 66 | Temp 97.5°F | Resp 18 | Ht 72.0 in | Wt 196.0 lb

## 2019-09-09 DIAGNOSIS — J449 Chronic obstructive pulmonary disease, unspecified: Secondary | ICD-10-CM | POA: Diagnosis not present

## 2019-09-09 DIAGNOSIS — I10 Essential (primary) hypertension: Secondary | ICD-10-CM | POA: Diagnosis not present

## 2019-09-09 DIAGNOSIS — I25118 Atherosclerotic heart disease of native coronary artery with other forms of angina pectoris: Secondary | ICD-10-CM

## 2019-09-22 DIAGNOSIS — J449 Chronic obstructive pulmonary disease, unspecified: Secondary | ICD-10-CM | POA: Diagnosis not present

## 2019-09-24 DIAGNOSIS — Z961 Presence of intraocular lens: Secondary | ICD-10-CM | POA: Diagnosis not present

## 2019-10-20 DIAGNOSIS — J449 Chronic obstructive pulmonary disease, unspecified: Secondary | ICD-10-CM | POA: Diagnosis not present

## 2019-11-01 IMAGING — CT CT ABD-PELV W/ CM
2 of 5 series · 15 of 46 positions shown, 17 images · IV contrast (iopamidol)
Comparison: 07/04/2010 and prior CTs

CLINICAL DATA: 76-year-old male with calcifications identified
within the UPPER abdomen on recent lumbar spine radiographs.

EXAM:
CT ABDOMEN AND PELVIS WITH CONTRAST
TECHNIQUE: Multidetector CT imaging of the abdomen and pelvis was performed
using the standard protocol following bolus administration of
intravenous contrast.
CONTRAST:  100mL KUJGND-4SS IOPAMIDOL (KUJGND-4SS) INJECTION 61%

[Series 2: abd pelvis · axial · 0.68mm/px · z∈[-1606,-1186]mm · 12 of 94 slices shown, 14 images (1 of 2)]
[im 5/94  soft-tissue]
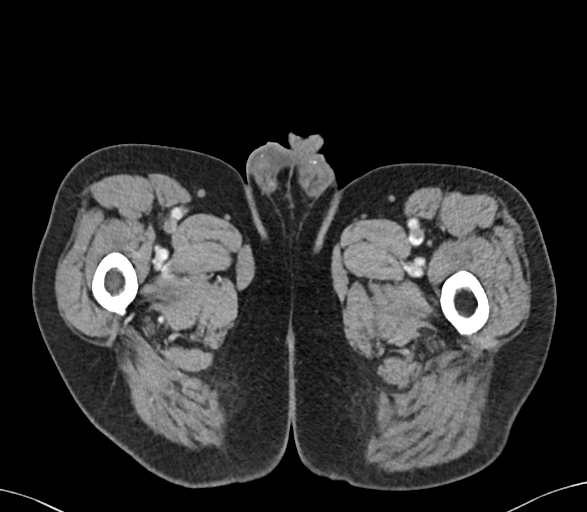
[im 5/94  bone]
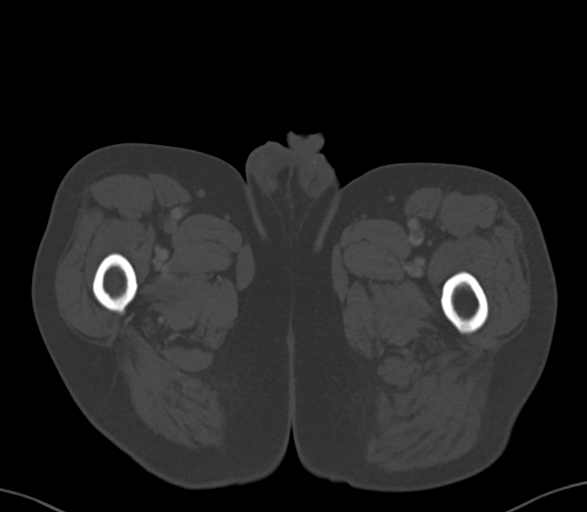
[im 14/94  soft-tissue]
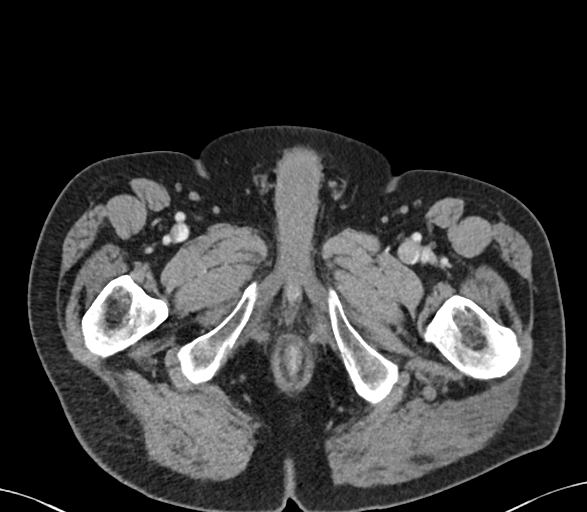
[im 23/94  soft-tissue]
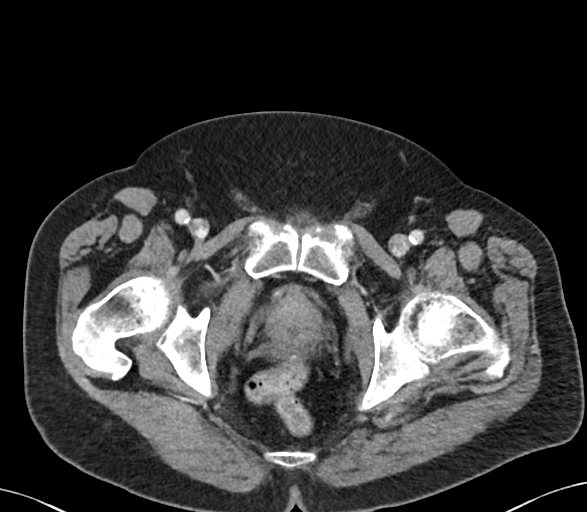
[im 27/94  soft-tissue]
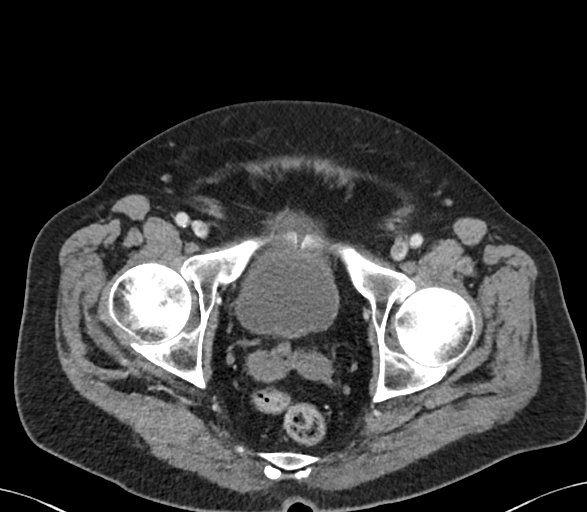
[im 36/94  soft-tissue]
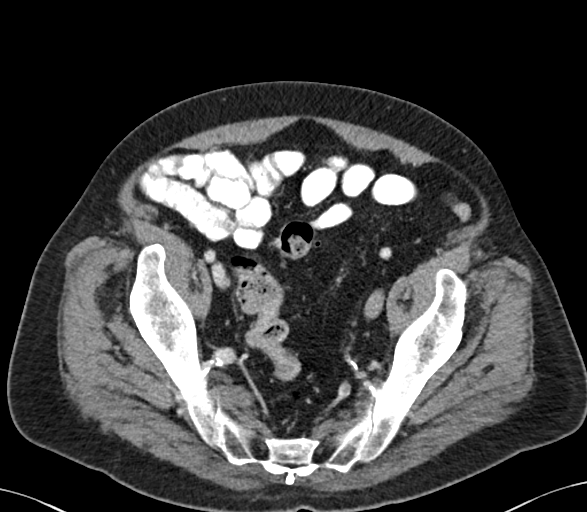
[im 45/94  soft-tissue]
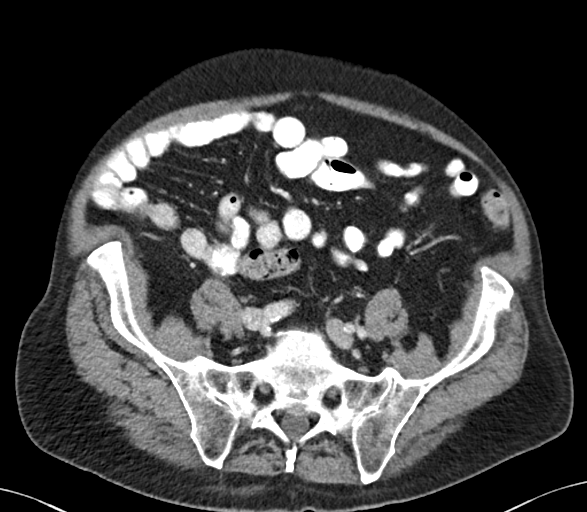
[im 49/94  soft-tissue]
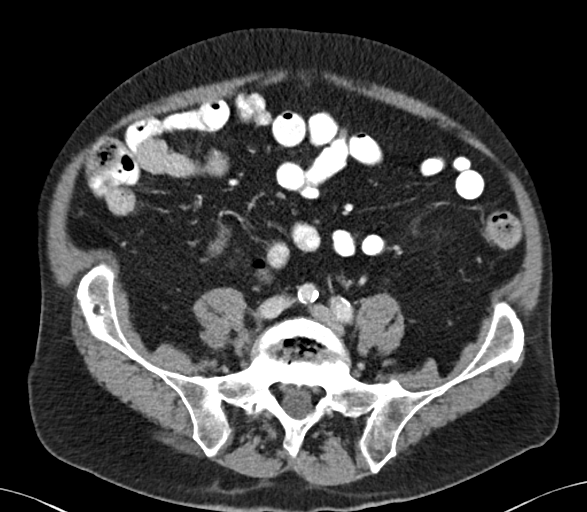
[im 58/94  soft-tissue]
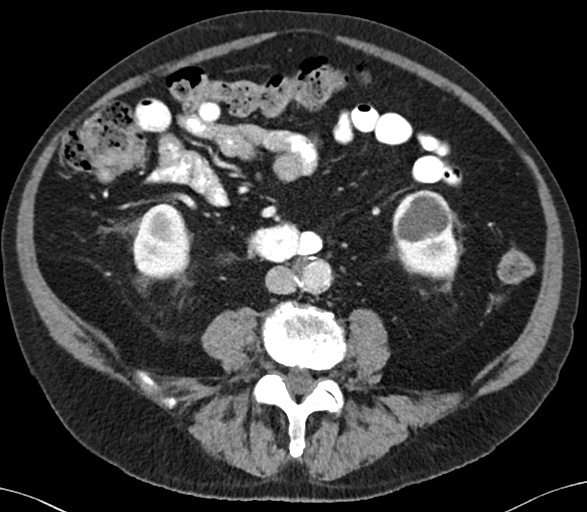
[im 67/94  soft-tissue]
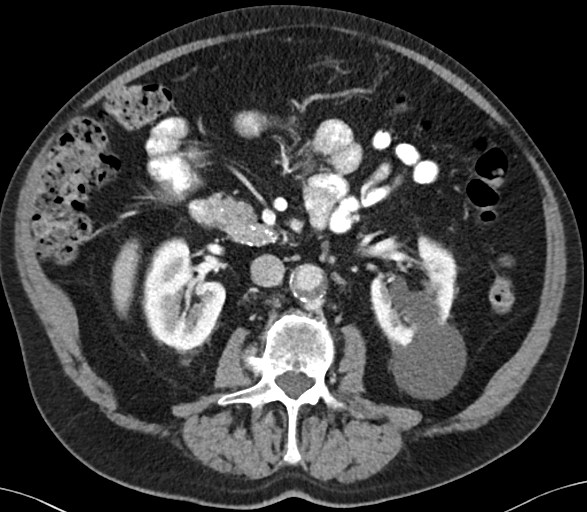
[im 67/94  bone]
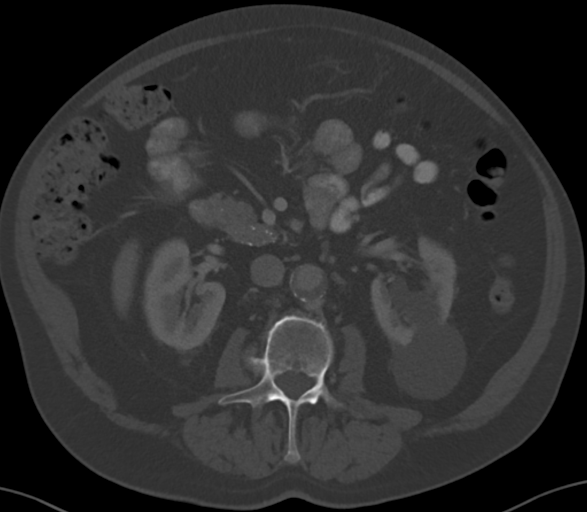
[im 71/94  soft-tissue]
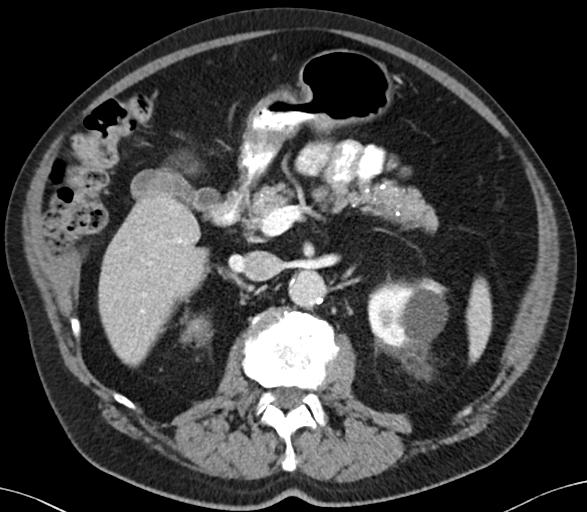
[im 80/94  soft-tissue]
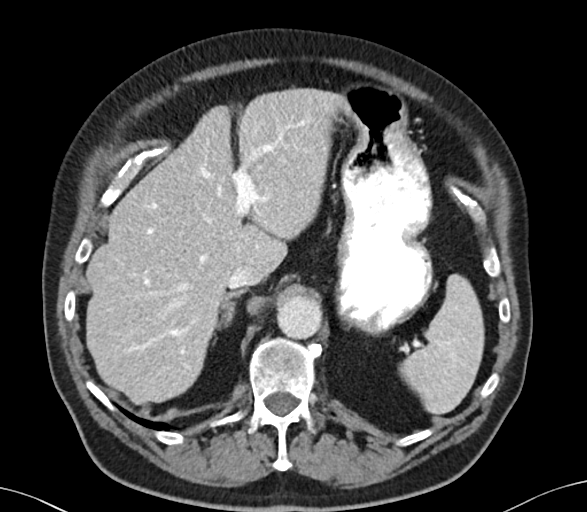
[im 89/94  soft-tissue]
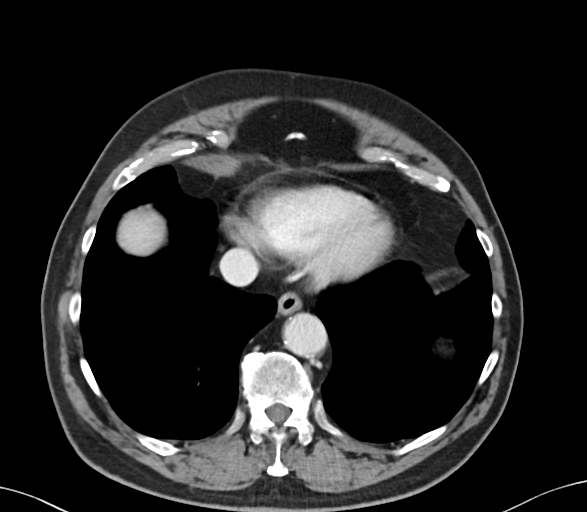

[Series 6: abd pelvis · coronal · 0.78mm/px · 3 of 173 slices shown (2 of 2)]
[im 58/173  soft-tissue]
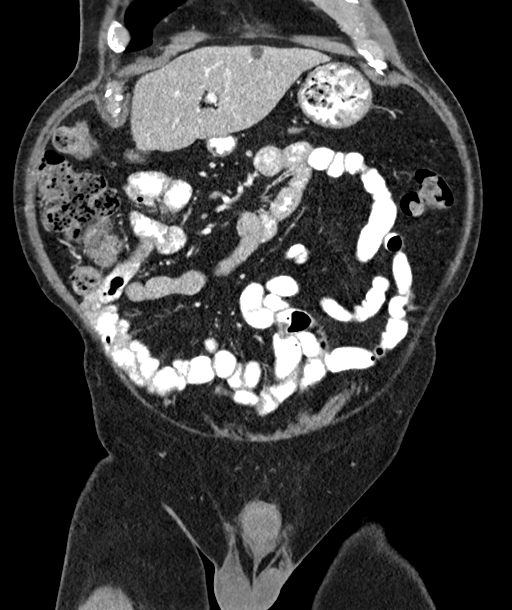
[im 77/173  soft-tissue]
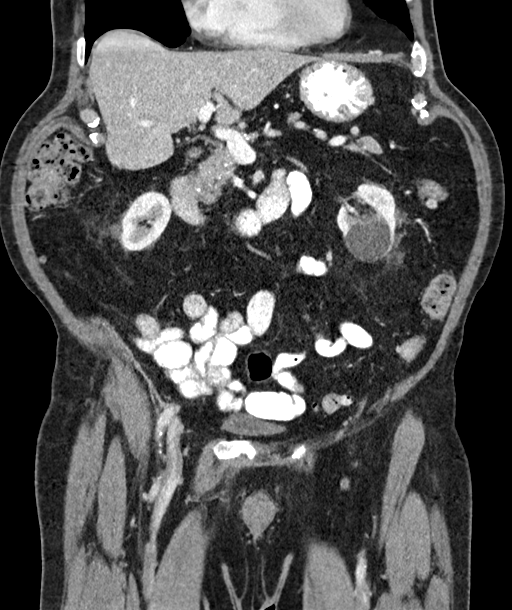
[im 96/173  soft-tissue]
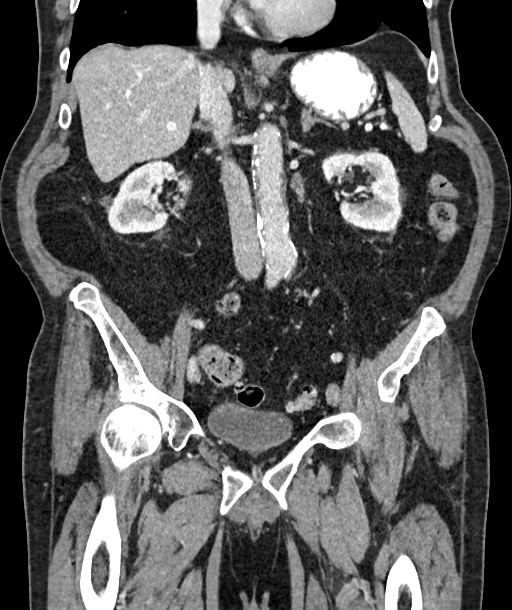

[15 of 46 positions shown; findings below may reference images not displayed]

FINDINGS: Lower chest: No acute abnormality

Hepatobiliary: The liver and gallbladder are unremarkable except for
stable LEFT hepatic cysts. No biliary dilatation.

Pancreas: Multiple small calcifications throughout the pancreas
again noted compatible with chronic calcific pancreatitis.. No
evidence of acute inflammation, pancreatic ductal dilatation or
pancreatic mass.

Spleen: Unremarkable

Adrenals/Urinary Tract: The kidneys, adrenal glands and bladder are
unremarkable except for LEFT renal cysts.

Stomach/Bowel: Stomach is within normal limits. Appendix appears
normal. No evidence of bowel wall thickening, distention, or
inflammatory changes. Sigmoid colonic diverticulosis noted without
evidence of diverticulitis.

Vascular/Lymphatic: Aortic atherosclerosis. No enlarged abdominal or
pelvic lymph nodes.

Reproductive: Prostate is unremarkable.

Other: No ascites, focal collection or pneumoperitoneum.

Musculoskeletal: No acute bony abnormality or suspicious bony
lesions identified. Degenerative changes within the lumbar spine
again noted.
IMPRESSION: 1. Multiple small calcifications within the pancreas compatible with
chronic calcific pancreatitis. This accounts for the recent
radiographic abnormality. No evidence of acute pancreatitis.
2.  Aortic Atherosclerosis ([id number]).

## 2019-11-20 DIAGNOSIS — J449 Chronic obstructive pulmonary disease, unspecified: Secondary | ICD-10-CM | POA: Diagnosis not present

## 2019-12-20 DIAGNOSIS — J449 Chronic obstructive pulmonary disease, unspecified: Secondary | ICD-10-CM | POA: Diagnosis not present

## 2020-01-11 DIAGNOSIS — J9611 Chronic respiratory failure with hypoxia: Secondary | ICD-10-CM | POA: Diagnosis not present

## 2020-01-11 DIAGNOSIS — J449 Chronic obstructive pulmonary disease, unspecified: Secondary | ICD-10-CM | POA: Diagnosis not present

## 2020-01-20 DIAGNOSIS — J449 Chronic obstructive pulmonary disease, unspecified: Secondary | ICD-10-CM | POA: Diagnosis not present

## 2020-02-01 DIAGNOSIS — D485 Neoplasm of uncertain behavior of skin: Secondary | ICD-10-CM | POA: Diagnosis not present

## 2020-02-01 DIAGNOSIS — L820 Inflamed seborrheic keratosis: Secondary | ICD-10-CM | POA: Diagnosis not present

## 2020-02-01 DIAGNOSIS — X32XXXA Exposure to sunlight, initial encounter: Secondary | ICD-10-CM | POA: Diagnosis not present

## 2020-02-01 DIAGNOSIS — D0439 Carcinoma in situ of skin of other parts of face: Secondary | ICD-10-CM | POA: Diagnosis not present

## 2020-02-01 DIAGNOSIS — L821 Other seborrheic keratosis: Secondary | ICD-10-CM | POA: Diagnosis not present

## 2020-02-01 DIAGNOSIS — L570 Actinic keratosis: Secondary | ICD-10-CM | POA: Diagnosis not present

## 2020-02-01 DIAGNOSIS — Z85828 Personal history of other malignant neoplasm of skin: Secondary | ICD-10-CM | POA: Diagnosis not present

## 2020-02-01 DIAGNOSIS — L400 Psoriasis vulgaris: Secondary | ICD-10-CM | POA: Diagnosis not present

## 2020-02-01 DIAGNOSIS — D2261 Melanocytic nevi of right upper limb, including shoulder: Secondary | ICD-10-CM | POA: Diagnosis not present

## 2020-02-01 DIAGNOSIS — D225 Melanocytic nevi of trunk: Secondary | ICD-10-CM | POA: Diagnosis not present

## 2020-02-01 DIAGNOSIS — L538 Other specified erythematous conditions: Secondary | ICD-10-CM | POA: Diagnosis not present

## 2020-02-15 ENCOUNTER — Other Ambulatory Visit: Payer: Self-pay | Admitting: Family Medicine

## 2020-02-19 DIAGNOSIS — J449 Chronic obstructive pulmonary disease, unspecified: Secondary | ICD-10-CM | POA: Diagnosis not present

## 2020-03-06 NOTE — Progress Notes (Deleted)
     Established patient visit   Patient: Gregory Haynes   DOB: [birth year]   79 y.o. Male  MRN: [id number] Visit Date: 03/09/2020  Today's healthcare provider: Wilhemena Durie, MD   No chief complaint on file.  Subjective    HPI   Hypertension, follow-up  BP Readings from Last 3 Encounters:  09/09/19 123/74  08/18/19 130/77  07/01/19 108/64   Wt Readings from Last 3 Encounters:  09/09/19 196 lb (88.9 kg)  08/18/19 191 lb 12.8 oz (87 kg)  07/01/19 191 lb 12.8 oz (87 kg)     He was last seen for hypertension 6 months ago.  BP at that visit was 123/74. Management since that visit includes; On Coreg and HCTZ. He reports {excellent/good/fair/poor:19665} compliance with treatment. He {is/is not:9024} having side effects. {document side effects if present:1} He {is/is not:9024} exercising. He {is/is not:9024} adherent to low salt diet.   Outside blood pressures are {enter patient reported home BP, or 'not being checked':1}.  He {does/does not:200015} smoke.  Use of agents associated with hypertension: {bp agents assoc with hypertension:511::"none"}.   ---------------------------------------------------------------------------------------------------  Coronary artery disease of native artery of native heart with stable angina pectoris ([place]) From 09/09/2019-Risk factors treated.  {Show patient history (optional):23778::" "}   Medications: Outpatient Medications Prior to Visit  Medication Sig  . aspirin 81 MG tablet Take 81 mg by mouth daily.   Marland Kitchen atorvastatin (LIPITOR) 40 MG tablet TAKE ONE TABLET BY MOUTH EVERY EVENING  . carvedilol (COREG) 12.5 MG tablet TAKE ONE TABLET BY MOUTH TWICE A DAY WITH A MEAL  . clobetasol cream (TEMOVATE) 4.81 % Apply 1 application topically 2 (two) times daily.  . clotrimazole (MYCELEX) 10 MG troche   . Fluticasone-Umeclidin-Vilant (TRELEGY ELLIPTA) 100-62.5-25 MCG/INH AEPB Inhale 1 puff into the lungs daily.  . folic acid (FOLVITE) 1 MG  tablet Take 1 mg by mouth daily. Unsure dose  . hydrochlorothiazide (MICROZIDE) 12.5 MG capsule   . methotrexate (RHEUMATREX) 2.5 MG tablet Take 10 mg by mouth once a week. On Friday  . methotrexate (RHEUMATREX) 2.5 MG tablet Take 10 mg by mouth once a week.  . OXYGEN Inhale 2 L/hr into the lungs. At night and as needed during day.   No facility-administered medications prior to visit.    Review of Systems  Constitutional: Negative for appetite change, chills and fever.  Respiratory: Negative for chest tightness, shortness of breath and wheezing.   Cardiovascular: Negative for chest pain and palpitations.  Gastrointestinal: Negative for abdominal pain, nausea and vomiting.    {Heme  Chem  Endocrine  Serology  Results Review (optional):23779::" "}  Objective    There were no vitals taken for this visit. {Show previous vital signs (optional):23777::" "}  Physical Exam  ***  No results found for any visits on 03/09/20.  Assessment & Plan     ***  No follow-ups on file.      {provider attestation***:1}   Wilhemena Durie, MD  [facility] [phone] (phone) [phone] (fax)  [place]

## 2020-03-09 ENCOUNTER — Ambulatory Visit: Payer: Self-pay | Admitting: Family Medicine

## 2020-03-09 DIAGNOSIS — D0439 Carcinoma in situ of skin of other parts of face: Secondary | ICD-10-CM | POA: Diagnosis not present

## 2020-03-09 DIAGNOSIS — I10 Essential (primary) hypertension: Secondary | ICD-10-CM

## 2020-03-21 DIAGNOSIS — J449 Chronic obstructive pulmonary disease, unspecified: Secondary | ICD-10-CM | POA: Diagnosis not present

## 2020-03-31 DIAGNOSIS — H35372 Puckering of macula, left eye: Secondary | ICD-10-CM | POA: Diagnosis not present

## 2020-04-21 DIAGNOSIS — J449 Chronic obstructive pulmonary disease, unspecified: Secondary | ICD-10-CM | POA: Diagnosis not present

## 2020-05-16 NOTE — Progress Notes (Signed)
I,April Miller,acting as a scribe for Wilhemena Durie, MD.,have documented all relevant documentation on the behalf of Wilhemena Durie, MD,as directed by  Wilhemena Durie, MD while in the presence of Wilhemena Durie, MD.   Established patient visit   Patient: Gregory Haynes   DOB: July 06, 1941   79 y.o. Male  MRN: [id number] Visit Date: 05/17/2020  Today's healthcare provider: Wilhemena Durie, MD   Chief Complaint  Patient presents with  . Follow-up  . Hypertension   Subjective    HPI  Patient is doing well.  He is taking medications as prescribed.  He is fully vaccinated against Covid. Hypertension, follow-up  BP Readings from Last 3 Encounters:  05/17/20 129/76  09/09/19 123/74  08/18/19 130/77   Wt Readings from Last 3 Encounters:  05/17/20 198 lb (89.8 kg)  09/09/19 196 lb (88.9 kg)  08/18/19 191 lb 12.8 oz (87 kg)     He was last seen for hypertension 10 months ago.  BP at that visit was 123/74. Management since that visit includes; On Coreg and HCTZ. He reports good compliance with treatment. He is not having side effects. none He is exercising. He is adherent to low salt diet.   Outside blood pressures are normal.  He does not smoke.  Use of agents associated with hypertension: none.   --------------------------------------------------------------------      Medications: Outpatient Medications Prior to Visit  Medication Sig  . aspirin 81 MG tablet Take 81 mg by mouth daily.   Marland Kitchen atorvastatin (LIPITOR) 40 MG tablet TAKE ONE TABLET BY MOUTH EVERY EVENING  . carvedilol (COREG) 12.5 MG tablet TAKE ONE TABLET BY MOUTH TWICE A DAY WITH A MEAL  . folic acid (FOLVITE) 1 MG tablet Take 1 mg by mouth daily. Unsure dose  . methotrexate (RHEUMATREX) 2.5 MG tablet Take 10 mg by mouth once a week. On Friday  . OXYGEN Inhale 2 L/hr into the lungs. At night and as needed during day.  . clobetasol cream (TEMOVATE) 4.19 % Apply 1 application topically  2 (two) times daily. (Patient not taking: Reported on 05/17/2020)  . clotrimazole (MYCELEX) 10 MG troche  (Patient not taking: Reported on 05/17/2020)  . Fluticasone-Umeclidin-Vilant (TRELEGY ELLIPTA) 100-62.5-25 MCG/INH AEPB Inhale 1 puff into the lungs daily.  . hydrochlorothiazide (MICROZIDE) 12.5 MG capsule  (Patient not taking: Reported on 05/17/2020)  . methotrexate (RHEUMATREX) 2.5 MG tablet Take 10 mg by mouth once a week. (Patient not taking: Reported on 05/17/2020)   No facility-administered medications prior to visit.    Review of Systems  Constitutional: Negative for appetite change, chills and fever.  Respiratory: Negative for chest tightness, shortness of breath and wheezing.   Cardiovascular: Negative for chest pain and palpitations.  Gastrointestinal: Negative for abdominal pain, nausea and vomiting.      Objective    BP 129/76 (BP Location: Left Arm, Patient Position: Sitting, Cuff Size: Large)   Pulse 69   Temp 98.2 F (36.8 C) (Oral)   Resp 16   Ht 6' (1.829 m)   Wt 198 lb (89.8 kg)   SpO2 97%   BMI 26.85 kg/m    Physical Exam Vitals reviewed.  Constitutional:      Appearance: He is well-developed.  HENT:     Head: Normocephalic and atraumatic.  Eyes:     General: No scleral icterus.    Conjunctiva/sclera: Conjunctivae normal.  Neck:     Thyroid: No thyromegaly.  Cardiovascular:     Rate  and Rhythm: Normal rate and regular rhythm.     Heart sounds: Normal heart sounds.  Pulmonary:     Effort: Pulmonary effort is normal.     Breath sounds: Normal breath sounds.  Abdominal:     Palpations: Abdomen is soft.  Musculoskeletal:     Right lower leg: No edema.     Left lower leg: No edema.  Skin:    General: Skin is warm and dry.  Neurological:     General: No focal deficit present.     Mental Status: He is alert and oriented to person, place, and time.  Psychiatric:        Mood and Affect: Mood normal.        Behavior: Behavior normal.         Thought Content: Thought content normal.        Judgment: Judgment normal.     BP 129/76 (BP Location: Left Arm, Patient Position: Sitting, Cuff Size: Large)   Pulse 69   Temp 98.2 F (36.8 C) (Oral)   Resp 16   Ht 6' (1.829 m)   Wt 198 lb (89.8 kg)   SpO2 97%   BMI 26.85 kg/m   General Appearance:    Alert, cooperative, no distress, appears stated age    No results found for any visits on 05/17/20.  Assessment & Plan     1. Essential hypertension  - CBC w/Diff/Platelet - Comprehensive Metabolic Panel (CMET) - Lipid panel - TSH  2. Need for influenza vaccination  - Flu Vaccine QUAD High Dose(Fluad)  3. COPD, moderate (HCC)/severe  - CBC w/Diff/Platelet - Comprehensive Metabolic Panel (CMET) - Lipid panel - TSH  4. Hypercholesteremia  - CBC w/Diff/Platelet - Comprehensive Metabolic Panel (CMET) - Lipid panel - TSH   No follow-ups on file.      I, Wilhemena Durie, MD, have reviewed all documentation for this visit. The documentation on 05/21/20 for the exam, diagnosis, procedures, and orders are all accurate and complete.    Chanc Kervin Cranford Mon, MD  [facility] [phone] (phone) [phone] (fax)  [place]

## 2020-05-17 ENCOUNTER — Other Ambulatory Visit: Payer: Self-pay

## 2020-05-17 ENCOUNTER — Ambulatory Visit (INDEPENDENT_AMBULATORY_CARE_PROVIDER_SITE_OTHER): Payer: Medicare HMO | Admitting: Family Medicine

## 2020-05-17 ENCOUNTER — Encounter: Payer: Self-pay | Admitting: Family Medicine

## 2020-05-17 VITALS — BP 129/76 | HR 69 | Temp 98.2°F | Resp 16 | Ht 72.0 in | Wt 198.0 lb

## 2020-05-17 DIAGNOSIS — Z23 Encounter for immunization: Secondary | ICD-10-CM

## 2020-05-17 DIAGNOSIS — J449 Chronic obstructive pulmonary disease, unspecified: Secondary | ICD-10-CM

## 2020-05-17 DIAGNOSIS — E7800 Pure hypercholesterolemia, unspecified: Secondary | ICD-10-CM

## 2020-05-17 DIAGNOSIS — I10 Essential (primary) hypertension: Secondary | ICD-10-CM | POA: Diagnosis not present

## 2020-05-18 LAB — CBC WITH DIFFERENTIAL/PLATELET
Basophils Absolute: 0 10*3/uL (ref 0.0–0.2)
Basos: 0 %
EOS (ABSOLUTE): 0.2 10*3/uL (ref 0.0–0.4)
Eos: 2 %
Hematocrit: 41.7 % (ref 37.5–51.0)
Hemoglobin: 13.5 g/dL (ref 13.0–17.7)
Immature Grans (Abs): 0 10*3/uL (ref 0.0–0.1)
Immature Granulocytes: 0 %
Lymphocytes Absolute: 1.5 10*3/uL (ref 0.7–3.1)
Lymphs: 17 %
MCH: 31.5 pg (ref 26.6–33.0)
MCHC: 32.4 g/dL (ref 31.5–35.7)
MCV: 97 fL (ref 79–97)
Monocytes Absolute: 0.8 10*3/uL (ref 0.1–0.9)
Monocytes: 10 %
Neutrophils Absolute: 6.2 10*3/uL (ref 1.4–7.0)
Neutrophils: 71 %
Platelets: 198 10*3/uL (ref 150–450)
RBC: 4.29 x10E6/uL (ref 4.14–5.80)
RDW: 13.8 % (ref 11.6–15.4)
WBC: 8.7 10*3/uL (ref 3.4–10.8)

## 2020-05-18 LAB — COMPREHENSIVE METABOLIC PANEL
ALT: 12 IU/L (ref 0–44)
AST: 19 IU/L (ref 0–40)
Albumin/Globulin Ratio: 2 (ref 1.2–2.2)
Albumin: 4.5 g/dL (ref 3.7–4.7)
Alkaline Phosphatase: 81 IU/L (ref 44–121)
BUN/Creatinine Ratio: 18 (ref 10–24)
BUN: 17 mg/dL (ref 8–27)
Bilirubin Total: 0.5 mg/dL (ref 0.0–1.2)
CO2: 25 mmol/L (ref 20–29)
Calcium: 9.4 mg/dL (ref 8.6–10.2)
Chloride: 100 mmol/L (ref 96–106)
Creatinine, Ser: 0.95 mg/dL (ref 0.76–1.27)
GFR calc Af Amer: 88 mL/min/{1.73_m2} (ref >59)
GFR calc non Af Amer: 76 mL/min/{1.73_m2} (ref >59)
Globulin, Total: 2.3 g/dL (ref 1.5–4.5)
Glucose: 95 mg/dL (ref 65–99)
Potassium: 4.5 mmol/L (ref 3.5–5.2)
Sodium: 140 mmol/L (ref 134–144)
Total Protein: 6.8 g/dL (ref 6.0–8.5)

## 2020-05-18 LAB — LIPID PANEL
Chol/HDL Ratio: 3 ratio (ref 0.0–5.0)
Cholesterol, Total: 142 mg/dL (ref 100–199)
HDL: 48 mg/dL (ref >39)
LDL Chol Calc (NIH): 79 mg/dL (ref 0–99)
Triglycerides: 76 mg/dL (ref 0–149)
VLDL Cholesterol Cal: 15 mg/dL (ref 5–40)

## 2020-05-18 LAB — TSH: TSH: 1.19 u[IU]/mL (ref 0.450–4.500)

## 2020-05-21 DIAGNOSIS — J449 Chronic obstructive pulmonary disease, unspecified: Secondary | ICD-10-CM | POA: Diagnosis not present

## 2020-06-13 ENCOUNTER — Other Ambulatory Visit: Payer: Self-pay | Admitting: Family Medicine

## 2020-06-13 NOTE — Telephone Encounter (Signed)
Requested Prescriptions  Pending Prescriptions Disp Refills  . atorvastatin (LIPITOR) 40 MG tablet [Pharmacy Med Name: ATORVASTATIN 40 MG TABLET] 90 tablet 3    Sig: TAKE ONE TABLET BY MOUTH EVERY EVENING     Cardiovascular:  Antilipid - Statins Failed - 06/13/2020  9:07 AM      Failed - LDL in normal range and within 360 days    LDL Chol Calc (NIH)  Date Value Ref Range Status  05/17/2020 79 0 - 99 mg/dL Final   LDL Direct  Date Value Ref Range Status  06/18/2019 91 0 - 99 mg/dL Final         Passed - Total Cholesterol in normal range and within 360 days    Cholesterol, Total  Date Value Ref Range Status  05/17/2020 142 100 - 199 mg/dL Final         Passed - HDL in normal range and within 360 days    HDL  Date Value Ref Range Status  05/17/2020 48 >39 mg/dL Final         Passed - Triglycerides in normal range and within 360 days    Triglycerides  Date Value Ref Range Status  05/17/2020 76 0 - 149 mg/dL Final         Passed - Patient is not pregnant      Passed - Valid encounter within last 12 months    Recent Outpatient Visits          3 weeks ago Essential hypertension   [facility] Jerrol Banana., MD   9 months ago Coronary artery disease of native artery of native heart with stable angina pectoris [facility])   [facility] Jerrol Banana., MD   11 months ago Annual physical exam   [facility] Jerrol Banana., MD   12 months ago Essential hypertension   [place], [place], [place]   1 year ago Upper respiratory tract infection, unspecified type   [facility] Jerrol Banana., MD      Future Appointments            In 5 months Jerrol Banana., MD [facility], [place]

## 2020-06-21 DIAGNOSIS — J449 Chronic obstructive pulmonary disease, unspecified: Secondary | ICD-10-CM | POA: Diagnosis not present

## 2020-06-27 DIAGNOSIS — Z87891 Personal history of nicotine dependence: Secondary | ICD-10-CM | POA: Diagnosis not present

## 2020-06-27 DIAGNOSIS — J449 Chronic obstructive pulmonary disease, unspecified: Secondary | ICD-10-CM | POA: Diagnosis not present

## 2020-06-27 DIAGNOSIS — Z122 Encounter for screening for malignant neoplasm of respiratory organs: Secondary | ICD-10-CM | POA: Diagnosis not present

## 2020-07-13 ENCOUNTER — Telehealth: Payer: Self-pay | Admitting: Family Medicine

## 2020-07-13 NOTE — Telephone Encounter (Signed)
Copied from [place] [phone]. Topic: Medicare AWV >> Jul 13, 2020  2:53 PM Cher Nakai R wrote: Reason for CRM:   Left message for patient to call back and schedule Medicare Annual Wellness Visit (AWV) in office.   If not able to come in office, please offer to do virtually.   Last AWV  07/01/2019  Please schedule at anytime with [facility] Health Advisor.  If any questions, please contact me at [phone]

## 2020-07-21 DIAGNOSIS — J449 Chronic obstructive pulmonary disease, unspecified: Secondary | ICD-10-CM | POA: Diagnosis not present

## 2020-07-27 DIAGNOSIS — I2510 Atherosclerotic heart disease of native coronary artery without angina pectoris: Secondary | ICD-10-CM | POA: Diagnosis not present

## 2020-07-27 DIAGNOSIS — J449 Chronic obstructive pulmonary disease, unspecified: Secondary | ICD-10-CM | POA: Diagnosis not present

## 2020-07-27 DIAGNOSIS — Z87891 Personal history of nicotine dependence: Secondary | ICD-10-CM | POA: Diagnosis not present

## 2020-07-27 DIAGNOSIS — Z122 Encounter for screening for malignant neoplasm of respiratory organs: Secondary | ICD-10-CM | POA: Diagnosis not present

## 2020-08-21 DIAGNOSIS — J449 Chronic obstructive pulmonary disease, unspecified: Secondary | ICD-10-CM | POA: Diagnosis not present

## 2020-09-08 ENCOUNTER — Telehealth: Payer: Self-pay | Admitting: Family Medicine

## 2020-09-08 NOTE — Telephone Encounter (Signed)
Copied from [place] [phone]. Topic: Medicare AWV >> Sep 08, 2020  1:43 PM Cher Nakai R wrote: Reason for CRM:   Left message for patient to call back and schedule Medicare Annual Wellness Visit (AWV) in office.   If not able to come in office, please offer to do virtually or by telephone.   Last AWV 07/01/2019  Please schedule at anytime with [facility] Health Advisor.  If any questions, please contact me at [phone]

## 2020-09-12 DIAGNOSIS — L821 Other seborrheic keratosis: Secondary | ICD-10-CM | POA: Diagnosis not present

## 2020-09-12 DIAGNOSIS — L400 Psoriasis vulgaris: Secondary | ICD-10-CM | POA: Diagnosis not present

## 2020-09-12 DIAGNOSIS — D485 Neoplasm of uncertain behavior of skin: Secondary | ICD-10-CM | POA: Diagnosis not present

## 2020-09-12 DIAGNOSIS — D2272 Melanocytic nevi of left lower limb, including hip: Secondary | ICD-10-CM | POA: Diagnosis not present

## 2020-09-12 DIAGNOSIS — Z85828 Personal history of other malignant neoplasm of skin: Secondary | ICD-10-CM | POA: Diagnosis not present

## 2020-09-12 DIAGNOSIS — D225 Melanocytic nevi of trunk: Secondary | ICD-10-CM | POA: Diagnosis not present

## 2020-09-12 DIAGNOSIS — L570 Actinic keratosis: Secondary | ICD-10-CM | POA: Diagnosis not present

## 2020-09-12 DIAGNOSIS — X32XXXA Exposure to sunlight, initial encounter: Secondary | ICD-10-CM | POA: Diagnosis not present

## 2020-09-12 DIAGNOSIS — D2262 Melanocytic nevi of left upper limb, including shoulder: Secondary | ICD-10-CM | POA: Diagnosis not present

## 2020-09-13 DIAGNOSIS — L400 Psoriasis vulgaris: Secondary | ICD-10-CM | POA: Diagnosis not present

## 2020-09-21 DIAGNOSIS — J449 Chronic obstructive pulmonary disease, unspecified: Secondary | ICD-10-CM | POA: Diagnosis not present

## 2020-09-24 DIAGNOSIS — I10 Essential (primary) hypertension: Secondary | ICD-10-CM | POA: Diagnosis not present

## 2020-09-24 DIAGNOSIS — Z7951 Long term (current) use of inhaled steroids: Secondary | ICD-10-CM | POA: Diagnosis not present

## 2020-09-24 DIAGNOSIS — J449 Chronic obstructive pulmonary disease, unspecified: Secondary | ICD-10-CM | POA: Diagnosis not present

## 2020-09-24 DIAGNOSIS — Z87891 Personal history of nicotine dependence: Secondary | ICD-10-CM | POA: Diagnosis not present

## 2020-09-24 DIAGNOSIS — Z8249 Family history of ischemic heart disease and other diseases of the circulatory system: Secondary | ICD-10-CM | POA: Diagnosis not present

## 2020-09-24 DIAGNOSIS — Z85828 Personal history of other malignant neoplasm of skin: Secondary | ICD-10-CM | POA: Diagnosis not present

## 2020-09-24 DIAGNOSIS — Z809 Family history of malignant neoplasm, unspecified: Secondary | ICD-10-CM | POA: Diagnosis not present

## 2020-09-24 DIAGNOSIS — E785 Hyperlipidemia, unspecified: Secondary | ICD-10-CM | POA: Diagnosis not present

## 2020-09-24 DIAGNOSIS — Z7982 Long term (current) use of aspirin: Secondary | ICD-10-CM | POA: Diagnosis not present

## 2020-09-24 DIAGNOSIS — J9610 Chronic respiratory failure, unspecified whether with hypoxia or hypercapnia: Secondary | ICD-10-CM | POA: Diagnosis not present

## 2020-10-19 DIAGNOSIS — J449 Chronic obstructive pulmonary disease, unspecified: Secondary | ICD-10-CM | POA: Diagnosis not present

## 2020-10-30 DIAGNOSIS — R918 Other nonspecific abnormal finding of lung field: Secondary | ICD-10-CM | POA: Diagnosis not present

## 2020-10-30 DIAGNOSIS — R911 Solitary pulmonary nodule: Secondary | ICD-10-CM | POA: Diagnosis not present

## 2020-10-31 ENCOUNTER — Telehealth: Payer: Self-pay | Admitting: Family Medicine

## 2020-10-31 NOTE — Telephone Encounter (Signed)
Copied from [place] [phone]. Topic: Medicare AWV >> Oct 31, 2020  3:02 PM Cher Nakai R wrote: Reason for CRM:  Left message for patient to call back and schedule Medicare Annual Wellness Visit (AWV) in office.   If not able to come in office, please offer to do virtually or by telephone.   Last AWV: 07/01/2019  Please schedule at anytime with [facility] Health Advisor.  If any questions, please contact me at [phone]

## 2020-11-13 ENCOUNTER — Encounter: Payer: Self-pay | Admitting: Family Medicine

## 2020-11-17 ENCOUNTER — Other Ambulatory Visit: Payer: Self-pay | Admitting: Family Medicine

## 2020-11-17 NOTE — Telephone Encounter (Signed)
Requested medication (s) are due for refill today : yes  Requested medication (s) are on the active medication list: yes   Future visit scheduled: yes  Notes to clinic: Patient has appointment on 03/21/2021 Review for refill until that time   Requested Prescriptions  Pending Prescriptions Disp Refills   carvedilol (COREG) 12.5 MG tablet [Pharmacy Med Name: CARVEDILOL 12.5 MG TABLET] 180 tablet 2    Sig: TAKE ONE TABLET BY MOUTH TWICE A DAY WITH MEALS      Cardiovascular:  Beta Blockers Failed - 11/17/2020 10:09 AM      Failed - Valid encounter within last 6 months    Recent Outpatient Visits           6 months ago Essential hypertension   [facility] Jerrol Banana., MD   1 year ago Coronary artery disease of native artery of native heart with stable angina pectoris [facility])   [facility] Jerrol Banana., MD   1 year ago Annual physical exam   [facility] Jerrol Banana., MD   1 year ago Essential hypertension   [facility] Carles Collet M, [place]   2 years ago Upper respiratory tract infection, unspecified type   [facility] Jerrol Banana., MD       Future Appointments             In 4 months Jerrol Banana., MD [facility]             Passed - Last BP in normal range    BP Readings from Last 1 Encounters:  05/17/20 129/76          Passed - Last Heart Rate in normal range    Pulse Readings from Last 1 Encounters:  05/17/20 69

## 2020-11-19 DIAGNOSIS — J449 Chronic obstructive pulmonary disease, unspecified: Secondary | ICD-10-CM | POA: Diagnosis not present

## 2020-12-01 ENCOUNTER — Ambulatory Visit: Payer: Self-pay | Admitting: *Deleted

## 2020-12-01 NOTE — Telephone Encounter (Signed)
I would use a Nettie pot or saline rinses instead of the Sudafed at his age.

## 2020-12-01 NOTE — Telephone Encounter (Signed)
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 She feels that patient maybe getting sinus infection- headache, eyes puffy and draining, congested for 1 week. Advised would send note for review and PCP recommendation for treatment. Reason for Disposition . [1] Caller has medicine question about med NOT prescribed by PCP AND [2] triager unable to answer question (e.g., compatibility with other med, storage)  Answer Assessment - Initial Assessment Questions 1. NAME of MEDICATION: "What medicine are you calling about?"     Recommended decongestant 2. QUESTION: "What is your question?" (e.g., medication refill, side effect)     Patient has sinus symptoms for 1 week 3. PRESCRIBING HCP: "Who prescribed it?" Reason: if prescribed by specialist, call should be referred to that group.     OTC question 4. SYMPTOMS: "Do you have any symptoms?"     Congestion, eyes red and puffy, headache, slight cough 5. SEVERITY: If symptoms are present, ask "Are they mild, moderate or severe?"     Moderate-afraid for patient to get worse, COVID home test- negative( 4/20) 6. PREGNANCY:  "Is there any chance that you are pregnant?" "When was your last menstrual period?"     n/a  Protocols used: MEDICATION QUESTION CALL-A-AH

## 2020-12-01 NOTE — Telephone Encounter (Signed)
Advised pt's wife as below.

## 2020-12-19 DIAGNOSIS — J449 Chronic obstructive pulmonary disease, unspecified: Secondary | ICD-10-CM | POA: Diagnosis not present

## 2021-01-02 DIAGNOSIS — R911 Solitary pulmonary nodule: Secondary | ICD-10-CM | POA: Diagnosis not present

## 2021-01-02 DIAGNOSIS — J449 Chronic obstructive pulmonary disease, unspecified: Secondary | ICD-10-CM | POA: Diagnosis not present

## 2021-01-19 DIAGNOSIS — J449 Chronic obstructive pulmonary disease, unspecified: Secondary | ICD-10-CM | POA: Diagnosis not present

## 2021-02-18 DIAGNOSIS — J449 Chronic obstructive pulmonary disease, unspecified: Secondary | ICD-10-CM | POA: Diagnosis not present

## 2021-03-16 ENCOUNTER — Telehealth: Payer: Self-pay

## 2021-03-16 NOTE — Telephone Encounter (Unsigned)
Copied from [place] [phone]. Topic: Appointment Scheduling - Scheduling Inquiry for Clinic >> Mar 16, 2021 12:06 PM Alanda Slim E wrote: Reason for CRM: Pts appt scheduled for 8.10.22 needs to be rescheduled / pt would like to reschedule but when I tried to schedule the appt using the DT it only give me option for an AWV/ please call pt to reschedule

## 2021-03-16 NOTE — Telephone Encounter (Signed)
Called pt but vm is full so unable to leave a message.

## 2021-03-19 ENCOUNTER — Encounter: Payer: Self-pay | Admitting: Family Medicine

## 2021-03-20 NOTE — Telephone Encounter (Signed)
Tried calling two times today and no answer and vm still full so unable to leave message.

## 2021-03-20 NOTE — Telephone Encounter (Signed)
FYI:  I don't think this patient is coming tomorrow but can't get them on the phone to verify.

## 2021-03-21 ENCOUNTER — Encounter: Payer: Medicare HMO | Admitting: Family Medicine

## 2021-03-21 DIAGNOSIS — J449 Chronic obstructive pulmonary disease, unspecified: Secondary | ICD-10-CM | POA: Diagnosis not present

## 2021-03-21 NOTE — Progress Notes (Deleted)
Complete physical exam   Patient: Gregory Haynes   DOB: [birth year]   80 y.o. Male  MRN: [id number] Visit Date: 03/21/2021  Today's healthcare provider: Wilhemena Durie, MD   No chief complaint on file.  Subjective    Gregory Haynes is a 80 y.o. male who presents today for a complete physical exam.  He reports consuming a {diet types:17450} diet. {Exercise:19826} He generally feels {well/fairly well/poorly:18703}. He reports sleeping {well/fairly well/poorly:18703}. He {does/does not:200015} have additional problems to discuss today.  HPI  ***  Past Medical History:  Diagnosis Date  . Anemia   . Cancer ([place])    [place]  . Cataract   . COPD (chronic obstructive pulmonary disease) ([place])   . Helicobacter pylori ab+   . Hyperlipidemia   . Hypertension   . Psoriasis   . Wears hearing aid in both ears    Past Surgical History:  Procedure Laterality Date  . CATARACT EXTRACTION W/PHACO Left 08/18/2019   Procedure: CATARACT EXTRACTION PHACO AND INTRAOCULAR LENS PLACEMENT (IOC) LEFT 6.68,   00:54.1,   30.8%;  Surgeon: Leandrew Koyanagi, MD;  Location: [facility];  Service: Ophthalmology;  Laterality: Left;  . COLONOSCOPY     Social History   Socioeconomic History  . Marital status: Married    Spouse name: Patsy  . Number of children: 2  . Years of education: Not on file  . Highest education level: Some college, no degree  Occupational History  . Occupation: retired  Tobacco Use  . Smoking status: Former    Packs/day: 1.50    Years: 55.00    Pack years: 82.50    Types: Cigarettes    Quit date: 08/13/2011    Years since quitting: 9.6  . Smokeless tobacco: Never  Vaping Use  . Vaping Use: Never used  Substance and Sexual Activity  . Alcohol use: Yes    Comment: occasionally  . Drug use: No  . Sexual activity: Not on file  Other Topics Concern  . Not on file  Social History Narrative  . Not on file   Social Determinants of Health   Financial  Resource Strain: Not on file  Food Insecurity: Not on file  Transportation Needs: Not on file  Physical Activity: Not on file  Stress: Not on file  Social Connections: Not on file  Intimate Partner Violence: Not on file   Family Status  Relation Name Status  . Father  Deceased at age 47  . Brother  Alive  . Brother  Alive  . Brother  Alive  . Mother  Deceased at age 99       lymph nodes cancer  . Daughter  Alive  . Daughter  Alive  . [employer]  . [employer]  . [employer]  . [employer]  . GChild  Alive   Family History  Problem Relation Age of Onset  . Heart attack Father   . Heart disease Brother   . Hyperlipidemia Brother   . Alcohol abuse Brother    No Known Allergies  Patient Care Team: Jerrol Banana., MD as PCP - General (Family Medicine) Bari Mantis, MD as Referring Physician (Internal Medicine) Beverly Gust, MD as Consulting Physician (Otolaryngology) Tilda Franco, MD as Referring Physician (Cardiology) Dasher, Rayvon Char, MD (Dermatology)   Medications: Outpatient Medications Prior to Visit  Medication Sig  . aspirin 81 MG tablet Take 81 mg by mouth daily.   Marland Kitchen  atorvastatin (LIPITOR) 40 MG tablet TAKE ONE TABLET BY MOUTH EVERY EVENING  . carvedilol (COREG) 12.5 MG tablet TAKE ONE TABLET BY MOUTH TWICE A DAY WITH MEALS  . clobetasol cream (TEMOVATE) [id number] % Apply 1 application topically 2 (two) times daily. (Patient not taking: Reported on 05/17/2020)  . clotrimazole (MYCELEX) 10 MG troche  (Patient not taking: Reported on 05/17/2020)  . Fluticasone-Umeclidin-Vilant (TRELEGY ELLIPTA) 100-62.5-25 MCG/INH AEPB Inhale 1 puff into the lungs daily.  . folic acid (FOLVITE) 1 MG tablet Take 1 mg by mouth daily. Unsure dose  . hydrochlorothiazide (MICROZIDE) 12.5 MG capsule  (Patient not taking: Reported on 05/17/2020)  . methotrexate (RHEUMATREX) 2.5 MG tablet Take 10 mg by mouth once a week. On Friday  . methotrexate (RHEUMATREX) 2.5 MG tablet Take  10 mg by mouth once a week. (Patient not taking: Reported on 05/17/2020)  . OXYGEN Inhale 2 L/hr into the lungs. At night and as needed during day.   No facility-administered medications prior to visit.    Review of Systems  All other systems reviewed and are negative.  {Labs  Heme  Chem  Endocrine  Serology  Results Review (optional):23779}  Objective    There were no vitals taken for this visit. {Show previous vital signs (optional):23777}  Physical Exam  ***  Last depression screening scores PHQ 2/9 Scores 07/01/2019 06/24/2018 05/02/2017  PHQ - 2 Score 0 1 0  PHQ- 9 Score - - -   Last fall risk screening Fall Risk  07/01/2019  Falls in the past year? 0  Number falls in past yr: 0  Injury with Fall? 0   Last Audit-C alcohol use screening No flowsheet data found. A score of 3 or more in women, and 4 or more in men indicates increased risk for alcohol abuse, EXCEPT if all of the points are from question 1   No results found for any visits on 03/21/21.  Assessment & Plan    Routine Health Maintenance and Physical Exam  Exercise Activities and Dietary recommendations  Goals     . Increase water intake     Recommend increasing water intake to 4 glasses of water a day.    06/24/18- Continue to cut back on sweet tea and increase water intake to 4-6 glasses a day.          Immunization History  Administered Date(s) Administered  . Fluad Quad(high Dose 65+) 05/17/2020  . Influenza, High Dose Seasonal PF 04/30/2016, 05/05/2018, 04/23/2019  . Influenza-Unspecified 06/12/2017  . PFIZER(Purple Top)SARS-COV-2 Vaccination 08/16/2019, 09/06/2019  . Pneumococcal Conjugate-13 07/19/2013, 11/10/2014  . Pneumococcal Polysaccharide-23 04/30/2016  . Tdap 11/05/2011, 03/19/2012  . Zoster, Live 11/05/2011    Health Maintenance  Topic Date Due  . Hepatitis C Screening  Never done  . Zoster Vaccines- Shingrix (1 of 2) Never done  . COVID-19 Vaccine (3 - Pfizer risk  series) 10/04/2019  . INFLUENZA VACCINE  03/12/2021  . TETANUS/TDAP  03/19/2022  . PNA vac Low Risk Adult  Completed  . HPV VACCINES  Aged Out    Discussed health benefits of physical activity, and encouraged him to engage in regular exercise appropriate for his age and condition.  ***  No follow-ups on file.     {provider attestation***:1}   Wilhemena Durie, MD  [facility] [phone] (phone) [phone] (fax)  [place]

## 2021-03-21 NOTE — Telephone Encounter (Signed)
Noted  

## 2021-04-02 DIAGNOSIS — H35372 Puckering of macula, left eye: Secondary | ICD-10-CM | POA: Diagnosis not present

## 2021-04-21 DIAGNOSIS — J449 Chronic obstructive pulmonary disease, unspecified: Secondary | ICD-10-CM | POA: Diagnosis not present

## 2021-05-21 DIAGNOSIS — J449 Chronic obstructive pulmonary disease, unspecified: Secondary | ICD-10-CM | POA: Diagnosis not present

## 2021-06-12 DIAGNOSIS — R911 Solitary pulmonary nodule: Secondary | ICD-10-CM | POA: Diagnosis not present

## 2021-06-12 DIAGNOSIS — J449 Chronic obstructive pulmonary disease, unspecified: Secondary | ICD-10-CM | POA: Diagnosis not present

## 2021-06-18 ENCOUNTER — Other Ambulatory Visit: Payer: Self-pay | Admitting: Family Medicine

## 2021-06-18 NOTE — Telephone Encounter (Signed)
Requested medication (s) are due for refill today: expired medication  Requested medication (s) are on the active medication list: yes  Last refill:  06/13/20 #90 3 refills  Future visit scheduled: yes in 3 months  Notes to clinic:  expired medication. Do you want to renew Rx? Appt scheduled for 09/17/20. Do you want to change to annual physical? Instead of med refill appt .     Requested Prescriptions  Pending Prescriptions Disp Refills   atorvastatin (LIPITOR) 40 MG tablet [Pharmacy Med Name: ATORVASTATIN 40 MG TABLET] 90 tablet 3    Sig: TAKE ONE TABLET BY MOUTH EVERY EVENING     Cardiovascular:  Antilipid - Statins Failed - 06/18/2021  6:21 AM      Failed - Total Cholesterol in normal range and within 360 days    Cholesterol, Total  Date Value Ref Range Status  05/17/2020 142 100 - 199 mg/dL Final          Failed - LDL in normal range and within 360 days    LDL Chol Calc (NIH)  Date Value Ref Range Status  05/17/2020 79 0 - 99 mg/dL Final   LDL Direct  Date Value Ref Range Status  06/18/2019 91 0 - 99 mg/dL Final          Failed - HDL in normal range and within 360 days    HDL  Date Value Ref Range Status  05/17/2020 48 >39 mg/dL Final          Failed - Triglycerides in normal range and within 360 days    Triglycerides  Date Value Ref Range Status  05/17/2020 76 0 - 149 mg/dL Final          Failed - Valid encounter within last 12 months    Recent Outpatient Visits           1 year ago Essential hypertension   [facility] Jerrol Banana., MD   1 year ago Coronary artery disease of native artery of native heart with stable angina pectoris [facility])   [facility] Jerrol Banana., MD   1 year ago Annual physical exam   [facility] Jerrol Banana., MD   2 years ago Essential hypertension   [place], [place], [place]   2 years ago Upper respiratory tract infection,  unspecified type   [facility] Jerrol Banana., MD       Future Appointments             In 3 months Jerrol Banana., MD [facility], [place] - Patient is not pregnant

## 2021-06-21 DIAGNOSIS — J449 Chronic obstructive pulmonary disease, unspecified: Secondary | ICD-10-CM | POA: Diagnosis not present

## 2021-07-21 DIAGNOSIS — J449 Chronic obstructive pulmonary disease, unspecified: Secondary | ICD-10-CM | POA: Diagnosis not present

## 2021-08-12 ENCOUNTER — Other Ambulatory Visit: Payer: Self-pay | Admitting: Family Medicine

## 2021-08-21 DIAGNOSIS — J449 Chronic obstructive pulmonary disease, unspecified: Secondary | ICD-10-CM | POA: Diagnosis not present

## 2021-09-13 ENCOUNTER — Other Ambulatory Visit: Payer: Self-pay | Admitting: Family Medicine

## 2021-09-13 NOTE — Telephone Encounter (Signed)
Requested medication (s) are due for refill today:   Yes  Requested medication (s) are on the active medication list:   Yes  Future visit scheduled:   Yes 09/17/2021 with Dr Rosanna Randy   Last ordered: 06/18/2021 #90, 0 refills  Returned because protocol failed due to labs not done within last 12 months   Requested Prescriptions  Pending Prescriptions Disp Refills   atorvastatin (LIPITOR) 40 MG tablet [Pharmacy Med Name: ATORVASTATIN 40 MG TABLET] 90 tablet 0    Sig: TAKE ONE TABLET BY MOUTH EVERY EVENING     Cardiovascular:  Antilipid - Statins Failed - 09/13/2021  6:22 AM      Failed - Valid encounter within last 12 months    Recent Outpatient Visits           1 year ago Essential hypertension   [facility] Jerrol Banana., MD   2 years ago Coronary artery disease of native artery of native heart with stable angina pectoris [facility])   [facility] Jerrol Banana., MD   2 years ago Annual physical exam   [facility] Jerrol Banana., MD   2 years ago Essential hypertension   [facility] Carles Collet M, [place]   3 years ago Upper respiratory tract infection, unspecified type   [facility] Jerrol Banana., MD       Future Appointments             In 4 days Jerrol Banana., MD [facility]            Failed - Lipid Panel in normal range within the last 12 months    Cholesterol, Total  Date Value Ref Range Status  05/17/2020 142 100 - 199 mg/dL Final   LDL Chol Calc (NIH)  Date Value Ref Range Status  05/17/2020 79 0 - 99 mg/dL Final   LDL Direct  Date Value Ref Range Status  06/18/2019 91 0 - 99 mg/dL Final   HDL  Date Value Ref Range Status  05/17/2020 48 >39 mg/dL Final   Triglycerides  Date Value Ref Range Status  05/17/2020 76 0 - 149 mg/dL Final         Passed - Patient is not pregnant

## 2021-09-17 ENCOUNTER — Ambulatory Visit: Payer: Medicare HMO | Admitting: Family Medicine

## 2021-09-17 NOTE — Progress Notes (Deleted)
Established patient visit   Patient: Gregory Haynes   DOB: 24-Jul-1941   81 y.o. Male  MRN: [id number] Visit Date: 09/17/2021  Today's healthcare provider: Wilhemena Durie, MD   No chief complaint on file.  Subjective    HPI  Hypertension, follow-up  BP Readings from Last 3 Encounters:  05/17/20 129/76  09/09/19 123/74  08/18/19 130/77   Wt Readings from Last 3 Encounters:  05/17/20 198 lb (89.8 kg)  09/09/19 196 lb (88.9 kg)  08/18/19 191 lb 12.8 oz (87 kg)     He was last seen for hypertension 05/17/2020.  BP at that visit was 129/76. Management since that visit includes; on carvedilol and HCTZ. He reports {excellent/good/fair/poor:19665} compliance with treatment. He {is/is not:9024} having side effects. {document side effects if present:1} He {is/is not:9024} exercising. He {is/is not:9024} adherent to low salt diet.   Outside blood pressures are {enter patient reported home BP, or 'not being checked':1}.  He {does/does not:200015} smoke.  Use of agents associated with hypertension: {bp agents assoc with hypertension:511::"none"}.   --------------------------------------------------------------------------------------------------- Lipid/Cholesterol, follow-up  Last Lipid Panel: Lab Results  Component Value Date   CHOL 142 05/17/2020   LDLCALC 79 05/17/2020   LDLDIRECT 91 06/18/2019   HDL 48 05/17/2020   TRIG 76 05/17/2020    He was last seen for this 05/17/2020  Management since that visit includes; on atorvastatin.  He reports {excellent/good/fair/poor:19665} compliance with treatment. He {is/is not:9024} having side effects. {document side effects if present:1}  He is following a {diet:21022986} diet. Current exercise: {exercise WJXBJ:47829}  Last metabolic panel Lab Results  Component Value Date   GLUCOSE 95 05/17/2020   NA 140 05/17/2020   K 4.5 05/17/2020   BUN 17 05/17/2020   CREATININE 0.95 05/17/2020   GFRNONAA 76 05/17/2020    CALCIUM 9.4 05/17/2020   AST 19 05/17/2020   ALT 12 05/17/2020   The 10-year ASCVD risk score (Arnett DK, et al., 2019) is: 42.6%  --------------------------------------------------------------------------------------------------- COPD, Follow up  He was last seen for this 05/17/2020. Changes made include; no changes were made at this visit.   He reports {excellent/good/fair/poor:19665} compliance with treatment. He {IS/IS FAO:13086578} having side effects. *** he uses rescue inhaler {NUMBERS 1-12:18279} per {days/wks/mos/yrs:310907}. He IS experiencing {Symptoms; copd:17792}. He is NOT experiencing {Symptoms; copd:17792:o}. he reports breathing is {improved/worse/unchanged:3041574}.  Pulmonary Functions Testing Results:  No results found for: FEV1, FVC, FEV1FVC, TLC  -----------------------------------------------------------------------------------------   Medications: Outpatient Medications Prior to Visit  Medication Sig   aspirin 81 MG tablet Take 81 mg by mouth daily.    atorvastatin (LIPITOR) 40 MG tablet TAKE ONE TABLET BY MOUTH EVERY EVENING   carvedilol (COREG) 12.5 MG tablet TAKE ONE TABLET BY MOUTH TWICE A DAY WITH MEALS   clobetasol cream (TEMOVATE) 4.69 % Apply 1 application topically 2 (two) times daily. (Patient not taking: Reported on 05/17/2020)   clotrimazole (MYCELEX) 10 MG troche  (Patient not taking: Reported on 05/17/2020)   Fluticasone-Umeclidin-Vilant (TRELEGY ELLIPTA) 100-62.5-25 MCG/INH AEPB Inhale 1 puff into the lungs daily.   folic acid (FOLVITE) 1 MG tablet Take 1 mg by mouth daily. Unsure dose   hydrochlorothiazide (MICROZIDE) 12.5 MG capsule  (Patient not taking: Reported on 05/17/2020)   methotrexate (RHEUMATREX) 2.5 MG tablet Take 10 mg by mouth once a week. On Friday   methotrexate (RHEUMATREX) 2.5 MG tablet Take 10 mg by mouth once a week. (Patient not taking: Reported on 05/17/2020)   OXYGEN Inhale 2 L/hr into the  lungs. At night and as needed  during day.   No facility-administered medications prior to visit.    Review of Systems  Constitutional:  Negative for appetite change, chills and fever.  Respiratory:  Negative for chest tightness, shortness of breath and wheezing.   Cardiovascular:  Negative for chest pain and palpitations.  Gastrointestinal:  Negative for abdominal pain, nausea and vomiting.   {Labs   Heme   Chem   Endocrine   Serology   Results Review (optional):23779}   Objective    There were no vitals taken for this visit. {Show previous vital signs (optional):23777}  Physical Exam  ***  No results found for any visits on 09/17/21.  Assessment & Plan     ***  No follow-ups on file.      {provider attestation***:1}   Wilhemena Durie, MD  [facility] [phone] (phone) [phone] (fax)  [place]

## 2021-09-21 DIAGNOSIS — J449 Chronic obstructive pulmonary disease, unspecified: Secondary | ICD-10-CM | POA: Diagnosis not present

## 2021-10-09 DIAGNOSIS — J9612 Chronic respiratory failure with hypercapnia: Secondary | ICD-10-CM | POA: Diagnosis not present

## 2021-10-09 DIAGNOSIS — R0609 Other forms of dyspnea: Secondary | ICD-10-CM | POA: Diagnosis not present

## 2021-10-09 DIAGNOSIS — J449 Chronic obstructive pulmonary disease, unspecified: Secondary | ICD-10-CM | POA: Diagnosis not present

## 2021-10-09 DIAGNOSIS — G4733 Obstructive sleep apnea (adult) (pediatric): Secondary | ICD-10-CM | POA: Diagnosis not present

## 2021-10-09 DIAGNOSIS — J988 Other specified respiratory disorders: Secondary | ICD-10-CM | POA: Diagnosis not present

## 2021-10-09 DIAGNOSIS — B9789 Other viral agents as the cause of diseases classified elsewhere: Secondary | ICD-10-CM | POA: Diagnosis not present

## 2021-10-19 DIAGNOSIS — J449 Chronic obstructive pulmonary disease, unspecified: Secondary | ICD-10-CM | POA: Diagnosis not present

## 2021-11-05 DIAGNOSIS — Z85828 Personal history of other malignant neoplasm of skin: Secondary | ICD-10-CM | POA: Diagnosis not present

## 2021-11-05 DIAGNOSIS — X32XXXA Exposure to sunlight, initial encounter: Secondary | ICD-10-CM | POA: Diagnosis not present

## 2021-11-05 DIAGNOSIS — L400 Psoriasis vulgaris: Secondary | ICD-10-CM | POA: Diagnosis not present

## 2021-11-05 DIAGNOSIS — L570 Actinic keratosis: Secondary | ICD-10-CM | POA: Diagnosis not present

## 2021-11-05 DIAGNOSIS — D2239 Melanocytic nevi of other parts of face: Secondary | ICD-10-CM | POA: Diagnosis not present

## 2021-11-05 DIAGNOSIS — Z08 Encounter for follow-up examination after completed treatment for malignant neoplasm: Secondary | ICD-10-CM | POA: Diagnosis not present

## 2021-11-10 ENCOUNTER — Other Ambulatory Visit: Payer: Self-pay | Admitting: Family Medicine

## 2021-11-12 ENCOUNTER — Other Ambulatory Visit: Payer: Self-pay | Admitting: Family Medicine

## 2021-11-12 NOTE — Telephone Encounter (Signed)
Requested medication (s) are due for refill today: yes  ?  ?Requested medication (s) are on the active medication list: yes ? ?Last refill:  Carvedilol 08/12/21 and Atorvastatin 09/13/21 ? ?Future visit scheduled: yes ? ?Notes to clinic:  Unable to refill per protocol due to failed labs, no updated results, appointment needed. ? ? ?  ?Requested Prescriptions  ?Pending Prescriptions Disp Refills  ? carvedilol (COREG) 12.5 MG tablet [Pharmacy Med Name: CARVEDILOL 12.5 MG TABLET] 180 tablet 0  ?  Sig: TAKE ONE TABLET BY MOUTH TWICE A DAY WITH MEALS  ?  ? Cardiovascular: Beta Blockers 3 Failed - 11/12/2021  1:52 PM  ?  ?  Failed - Cr in normal range and within 360 days  ?  Creatinine  ?Date Value Ref Range Status  ?03/07/2012 0.87 0.60 - 1.30 mg/dL Final  ? ?Creatinine, Ser  ?Date Value Ref Range Status  ?05/17/2020 0.95 0.76 - 1.27 mg/dL Final  ?  ?  ?  ?  Failed - AST in normal range and within 360 days  ?  AST  ?Date Value Ref Range Status  ?05/17/2020 19 0 - 40 IU/L Final  ? ?SGOT(AST)  ?Date Value Ref Range Status  ?03/06/2012 50 (H) 15 - 37 Unit/L Final  ?  ?  ?  ?  Failed - ALT in normal range and within 360 days  ?  ALT  ?Date Value Ref Range Status  ?05/17/2020 12 0 - 44 IU/L Final  ? ?SGPT (ALT)  ?Date Value Ref Range Status  ?03/06/2012 26 U/L Final  ?  Comment:  ?  12-78 ?NOTE: NEW REFERENCE RANGE ?07/05/2011 ?  ?  ?  ?  ?  Failed - Valid encounter within last 6 months  ?  Recent Outpatient Visits   ? ?      ? 1 year ago Essential hypertension  ? [facility] Rosanna Randy, Retia Passe., MD  ? 2 years ago Coronary artery disease of native artery of native heart with stable angina pectoris [facility])  ? [facility] Jerrol Banana., MD  ? 2 years ago Annual physical exam  ? [facility] Jerrol Banana., MD  ? 2 years ago Essential hypertension  ? [place], [place]  ? 3 years ago Upper respiratory tract infection, unspecified type  ?  [facility] Jerrol Banana., MD  ? ?  ?  ?Future Appointments   ? ?        ? In 5 months Jerrol Banana., MD [facility]  ? ?  ? ?  ?  ?  Passed - Last BP in normal range  ?  BP Readings from Last 1 Encounters:  ?05/17/20 129/76  ?  ?  ?  ?  Passed - Last Heart Rate in normal range  ?  Pulse Readings from Last 1 Encounters:  ?05/17/20 69  ?  ?  ?  ?  ? atorvastatin (LIPITOR) 40 MG tablet 90 tablet 0  ?  Sig: Take 1 tablet (40 mg total) by mouth every evening.  ?  ? Cardiovascular:  Antilipid - Statins Failed - 11/12/2021  1:52 PM  ?  ?  Failed - Valid encounter within last 12 months  ?  Recent Outpatient Visits   ? ?      ? 1 year ago Essential hypertension  ? [facility] Jerrol Banana., MD  ?  2 years ago Coronary artery disease of native artery of native heart with stable angina pectoris ([place])  ? [facility] Jerrol Banana., MD  ? 2 years ago Annual physical exam  ? [facility] Jerrol Banana., MD  ? 2 years ago Essential hypertension  ? [place], [place]  ? 3 years ago Upper respiratory tract infection, unspecified type  ? [facility] Jerrol Banana., MD  ? ?  ?  ?Future Appointments   ? ?        ? In 5 months Jerrol Banana., MD [facility], [facility]  ? ?  ? ?  ?  ?  Failed - Lipid Panel in normal range within the last 12 months  ?  Cholesterol, Total  ?Date Value Ref Range Status  ?05/17/2020 142 100 - 199 mg/dL Final  ? ?LDL Chol Calc (NIH)  ?Date Value Ref Range Status  ?05/17/2020 79 0 - 99 mg/dL Final  ? ?LDL Direct  ?Date Value Ref Range Status  ?06/18/2019 91 0 - 99 mg/dL Final  ? ?HDL  ?Date Value Ref Range Status  ?05/17/2020 48 >39 mg/dL Final  ? ?Triglycerides  ?Date Value Ref Range Status  ?05/17/2020 76 0 - 149 mg/dL Final  ? ?  ?  ?  Passed - Patient is not pregnant  ?  ?  ? ? ?

## 2021-11-12 NOTE — Telephone Encounter (Signed)
Called and spoke with pt about a request for refill for Carvedilol. Advise pt that he will need a CPE. Last CPE was on 07/01/19. Pt agrees to appointment scheduled on 05/01/22 at 9:20. Advise to come fasting for lab work. Pt mention that he would need a refill of Atorvastatin 40 mg. ?

## 2021-11-19 DIAGNOSIS — J449 Chronic obstructive pulmonary disease, unspecified: Secondary | ICD-10-CM | POA: Diagnosis not present

## 2021-12-10 ENCOUNTER — Other Ambulatory Visit: Payer: Self-pay | Admitting: Family Medicine

## 2021-12-11 DIAGNOSIS — J449 Chronic obstructive pulmonary disease, unspecified: Secondary | ICD-10-CM | POA: Diagnosis not present

## 2021-12-11 DIAGNOSIS — R918 Other nonspecific abnormal finding of lung field: Secondary | ICD-10-CM | POA: Diagnosis not present

## 2021-12-11 DIAGNOSIS — R911 Solitary pulmonary nodule: Secondary | ICD-10-CM | POA: Diagnosis not present

## 2021-12-11 DIAGNOSIS — J432 Centrilobular emphysema: Secondary | ICD-10-CM | POA: Diagnosis not present

## 2021-12-11 DIAGNOSIS — J438 Other emphysema: Secondary | ICD-10-CM | POA: Diagnosis not present

## 2021-12-11 NOTE — Telephone Encounter (Signed)
Requested medication (s) are due for refill today: yes ? ?Requested medication (s) are on the active medication list: yes   ? ?Last refill: 09/14/21  #90  0 refills ? ?Future visit scheduled yes 05/01/22 ? ?Notes to clinic: Failed due to labs, please review. ? ?Requested Prescriptions  ?Pending Prescriptions Disp Refills  ? atorvastatin (LIPITOR) 40 MG tablet [Pharmacy Med Name: ATORVASTATIN 40 MG TABLET] 90 tablet 0  ?  Sig: TAKE ONE TABLET BY MOUTH EVERY EVENING  ?  ? Cardiovascular:  Antilipid - Statins Failed - 12/10/2021  6:22 AM  ?  ?  Failed - Valid encounter within last 12 months  ?  Recent Outpatient Visits   ? ?      ? 1 year ago Essential hypertension  ? [facility] Rosanna Randy, Retia Passe., MD  ? 2 years ago Coronary artery disease of native artery of native heart with stable angina pectoris [facility])  ? [facility] Jerrol Banana., MD  ? 2 years ago Annual physical exam  ? [facility] Jerrol Banana., MD  ? 2 years ago Essential hypertension  ? [place], [place]  ? 3 years ago Upper respiratory tract infection, unspecified type  ? [facility] Jerrol Banana., MD  ? ?  ?  ?Future Appointments   ? ?        ? In 4 months Jerrol Banana., MD [facility]  ? ?  ? ? ?  ?  ?  Failed - Lipid Panel in normal range within the last 12 months  ?  Cholesterol, Total  ?Date Value Ref Range Status  ?05/17/2020 142 100 - 199 mg/dL Final  ? ?LDL Chol Calc (NIH)  ?Date Value Ref Range Status  ?05/17/2020 79 0 - 99 mg/dL Final  ? ?LDL Direct  ?Date Value Ref Range Status  ?06/18/2019 91 0 - 99 mg/dL Final  ? ?HDL  ?Date Value Ref Range Status  ?05/17/2020 48 >39 mg/dL Final  ? ?Triglycerides  ?Date Value Ref Range Status  ?05/17/2020 76 0 - 149 mg/dL Final  ? ?  ?  ?  Passed - Patient is not pregnant  ?  ?  ? ? ? ? ?

## 2021-12-18 DIAGNOSIS — R911 Solitary pulmonary nodule: Secondary | ICD-10-CM | POA: Diagnosis not present

## 2021-12-18 DIAGNOSIS — J449 Chronic obstructive pulmonary disease, unspecified: Secondary | ICD-10-CM | POA: Diagnosis not present

## 2021-12-19 DIAGNOSIS — J449 Chronic obstructive pulmonary disease, unspecified: Secondary | ICD-10-CM | POA: Diagnosis not present

## 2022-01-19 DIAGNOSIS — J449 Chronic obstructive pulmonary disease, unspecified: Secondary | ICD-10-CM | POA: Diagnosis not present

## 2022-02-15 ENCOUNTER — Other Ambulatory Visit: Payer: Self-pay | Admitting: Family Medicine

## 2022-02-15 NOTE — Telephone Encounter (Signed)
Requested medication (s) are due for refill today: yes  Requested medication (s) are on the active medication list: yes  Last refill:  11/13/21 #180/0  Future visit scheduled: yes  Notes to clinic:  Unable to refill per protocol due to failed labs, no updated results.  Requested Prescriptions  Pending Prescriptions Disp Refills   carvedilol (COREG) 12.5 MG tablet [Pharmacy Med Name: CARVEDILOL 12.5 MG TABLET] 180 tablet 0    Sig: TAKE ONE TABLET BY MOUTH TWICE A DAY WITH MEALS     Cardiovascular: Beta Blockers 3 Failed - 02/15/2022  6:22 AM      Failed - Cr in normal range and within 360 days    Creatinine  Date Value Ref Range Status  03/07/2012 0.87 0.60 - 1.30 mg/dL Final   Creatinine, Ser  Date Value Ref Range Status  05/17/2020 0.95 0.76 - 1.27 mg/dL Final         Failed - AST in normal range and within 360 days    AST  Date Value Ref Range Status  05/17/2020 19 0 - 40 IU/L Final   SGOT(AST)  Date Value Ref Range Status  03/06/2012 50 (H) 15 - 37 Unit/L Final         Failed - ALT in normal range and within 360 days    ALT  Date Value Ref Range Status  05/17/2020 12 0 - 44 IU/L Final   SGPT (ALT)  Date Value Ref Range Status  03/06/2012 26 U/L Final    Comment:    12-78 NOTE: NEW REFERENCE RANGE 07/05/2011          Failed - Valid encounter within last 6 months    Recent Outpatient Visits           1 year ago Essential hypertension   [place] Jerrol Banana., MD   2 years ago Coronary artery disease of native artery of native heart with stable angina pectoris [facility])   [facility] Jerrol Banana., MD   2 years ago Annual physical exam   [facility] Jerrol Banana., MD   2 years ago Essential hypertension   [facility] Carles Collet M, [place]   3 years ago Upper respiratory tract infection, unspecified type   [facility] Jerrol Banana., MD        Future Appointments             In 2 months Jerrol Banana., MD [facility]            Passed - Last BP in normal range    BP Readings from Last 1 Encounters:  05/17/20 129/76         Passed - Last Heart Rate in normal range    Pulse Readings from Last 1 Encounters:  05/17/20 69

## 2022-02-18 DIAGNOSIS — J449 Chronic obstructive pulmonary disease, unspecified: Secondary | ICD-10-CM | POA: Diagnosis not present

## 2022-03-09 ENCOUNTER — Other Ambulatory Visit: Payer: Self-pay | Admitting: Family Medicine

## 2022-03-09 DIAGNOSIS — E7800 Pure hypercholesterolemia, unspecified: Secondary | ICD-10-CM

## 2022-03-11 NOTE — Telephone Encounter (Signed)
Requested medications are due for refill today.  yes  Requested medications are on the active medications list.  yes  Last refill. 12/11/2021 #90 0 refills  Future visit scheduled.   yes  Notes to clinic.  Labs are expired.    Requested Prescriptions  Pending Prescriptions Disp Refills   atorvastatin (LIPITOR) 40 MG tablet [Pharmacy Med Name: ATORVASTATIN 40 MG TABLET] 90 tablet 0    Sig: TAKE ONE TABLET BY MOUTH EVERY EVENING     Cardiovascular:  Antilipid - Statins Failed - 03/09/2022  6:53 AM      Failed - Valid encounter within last 12 months    Recent Outpatient Visits           1 year ago Essential hypertension   [facility] Jerrol Banana., MD   2 years ago Coronary artery disease of native artery of native heart with stable angina pectoris [facility])   [facility] Jerrol Banana., MD   2 years ago Annual physical exam   [facility] Jerrol Banana., MD   2 years ago Essential hypertension   [facility] Carles Collet M, [place]   3 years ago Upper respiratory tract infection, unspecified type   [facility] Jerrol Banana., MD       Future Appointments             In 1 month Jerrol Banana., MD [facility]            Failed - Lipid Panel in normal range within the last 12 months    Cholesterol, Total  Date Value Ref Range Status  05/17/2020 142 100 - 199 mg/dL Final   LDL Chol Calc (NIH)  Date Value Ref Range Status  05/17/2020 79 0 - 99 mg/dL Final   LDL Direct  Date Value Ref Range Status  06/18/2019 91 0 - 99 mg/dL Final   HDL  Date Value Ref Range Status  05/17/2020 48 >39 mg/dL Final   Triglycerides  Date Value Ref Range Status  05/17/2020 76 0 - 149 mg/dL Final         Passed - Patient is not pregnant

## 2022-03-21 DIAGNOSIS — J449 Chronic obstructive pulmonary disease, unspecified: Secondary | ICD-10-CM | POA: Diagnosis not present

## 2022-04-21 DIAGNOSIS — J449 Chronic obstructive pulmonary disease, unspecified: Secondary | ICD-10-CM | POA: Diagnosis not present

## 2022-04-26 DIAGNOSIS — Z8709 Personal history of other diseases of the respiratory system: Secondary | ICD-10-CM | POA: Diagnosis not present

## 2022-04-26 DIAGNOSIS — I10 Essential (primary) hypertension: Secondary | ICD-10-CM | POA: Diagnosis not present

## 2022-04-26 DIAGNOSIS — I25118 Atherosclerotic heart disease of native coronary artery with other forms of angina pectoris: Secondary | ICD-10-CM | POA: Diagnosis not present

## 2022-04-29 DIAGNOSIS — I25119 Atherosclerotic heart disease of native coronary artery with unspecified angina pectoris: Secondary | ICD-10-CM | POA: Diagnosis not present

## 2022-04-29 DIAGNOSIS — L4050 Arthropathic psoriasis, unspecified: Secondary | ICD-10-CM | POA: Diagnosis not present

## 2022-04-29 DIAGNOSIS — J9610 Chronic respiratory failure, unspecified whether with hypoxia or hypercapnia: Secondary | ICD-10-CM | POA: Diagnosis not present

## 2022-04-29 DIAGNOSIS — J439 Emphysema, unspecified: Secondary | ICD-10-CM | POA: Diagnosis not present

## 2022-04-29 DIAGNOSIS — I10 Essential (primary) hypertension: Secondary | ICD-10-CM | POA: Diagnosis not present

## 2022-04-29 DIAGNOSIS — E785 Hyperlipidemia, unspecified: Secondary | ICD-10-CM | POA: Diagnosis not present

## 2022-04-29 DIAGNOSIS — Z7951 Long term (current) use of inhaled steroids: Secondary | ICD-10-CM | POA: Diagnosis not present

## 2022-04-29 DIAGNOSIS — G4733 Obstructive sleep apnea (adult) (pediatric): Secondary | ICD-10-CM | POA: Diagnosis not present

## 2022-04-29 DIAGNOSIS — Z8249 Family history of ischemic heart disease and other diseases of the circulatory system: Secondary | ICD-10-CM | POA: Diagnosis not present

## 2022-04-29 DIAGNOSIS — L400 Psoriasis vulgaris: Secondary | ICD-10-CM | POA: Diagnosis not present

## 2022-04-29 DIAGNOSIS — Z7982 Long term (current) use of aspirin: Secondary | ICD-10-CM | POA: Diagnosis not present

## 2022-04-29 DIAGNOSIS — Z808 Family history of malignant neoplasm of other organs or systems: Secondary | ICD-10-CM | POA: Diagnosis not present

## 2022-04-30 NOTE — Progress Notes (Signed)
I,Gregory Haynes,acting as a scribe for Gregory Durie, MD.,have documented all relevant documentation on the behalf of Gregory Durie, MD,as directed by  Gregory Durie, MD while in the presence of Gregory Durie, MD.  Annual Wellness Visit    Patient: Gregory Haynes, Male    DOB: [birth year], 81 y.o.   MRN: [id number] Visit Date: 05/01/2022  Today's Provider: Wilhemena Durie, MD   Chief Complaint  Patient presents with   Medicare Wellness   Subjective    Gregory Haynes is a 81 y.o. male who presents today for his Annual Wellness Visit. Annual physical. He reports consuming a general diet. He generally feels well. He reports sleeping well. He does have additional problems to discuss today.  He was started on Zestril 10 mg at the [place]. HPI  Medications: Outpatient Medications Prior to Visit  Medication Sig   atorvastatin (LIPITOR) 40 MG tablet TAKE ONE TABLET BY MOUTH EVERY EVENING   carvedilol (COREG) 12.5 MG tablet TAKE ONE TABLET BY MOUTH TWICE A DAY WITH MEALS   Fluticasone-Umeclidin-Vilant (TRELEGY ELLIPTA) 100-62.5-25 MCG/INH AEPB Inhale 1 puff into the lungs daily.   folic acid (FOLVITE) 1 MG tablet Take 1 mg by mouth daily. Unsure dose   hydrochlorothiazide (MICROZIDE) 12.5 MG capsule    OXYGEN Inhale 2 L/hr into the lungs. At night and as needed during day.   [DISCONTINUED] aspirin 81 MG tablet Take 81 mg by mouth daily.    [DISCONTINUED] clobetasol cream (TEMOVATE) 8.65 % Apply 1 application topically 2 (two) times daily. (Patient not taking: Reported on 05/17/2020)   [DISCONTINUED] clotrimazole (MYCELEX) 10 MG troche  (Patient not taking: Reported on 05/17/2020)   [DISCONTINUED] methotrexate (RHEUMATREX) 2.5 MG tablet Take 10 mg by mouth once a week. On Friday   [DISCONTINUED] methotrexate (RHEUMATREX) 2.5 MG tablet Take 10 mg by mouth once a week. (Patient not taking: Reported on 05/17/2020)   No facility-administered medications prior to visit.     No Known Allergies  Patient Care Team: Jerrol Banana., MD as PCP - General (Family Medicine) Bari Mantis, MD as Referring Physician (Internal Medicine) Beverly Gust, MD as Consulting Physician (Otolaryngology) Tilda Franco, MD as Referring Physician (Cardiology) Dasher, Rayvon Char, MD (Dermatology)  Review of Systems  All other systems reviewed and are negative.   Last CBC Lab Results  Component Value Date   WBC 8.7 05/17/2020   HGB 13.5 05/17/2020   HCT 41.7 05/17/2020   MCV 97 05/17/2020   MCH 31.5 05/17/2020   RDW 13.8 05/17/2020   PLT 198 78/46/9629   Last metabolic panel Lab Results  Component Value Date   GLUCOSE 95 05/17/2020   NA 140 05/17/2020   K 4.5 05/17/2020   CL 100 05/17/2020   CO2 25 05/17/2020   BUN 17 05/17/2020   CREATININE 0.95 05/17/2020   GFRNONAA 76 05/17/2020   CALCIUM 9.4 05/17/2020   PROT 6.8 05/17/2020   ALBUMIN 4.5 05/17/2020   LABGLOB 2.3 05/17/2020   AGRATIO 2.0 05/17/2020   BILITOT 0.5 05/17/2020   ALKPHOS 81 05/17/2020   AST 19 05/17/2020   ALT 12 05/17/2020   ANIONGAP 8 03/07/2012   Last lipids Lab Results  Component Value Date   CHOL 142 05/17/2020   HDL 48 05/17/2020   LDLCALC 79 05/17/2020   LDLDIRECT 91 06/18/2019   TRIG 76 05/17/2020   CHOLHDL 3.0 05/17/2020   Last hemoglobin A1c Lab Results  Component Value Date   HGBA1C 6.3 (H)  05/02/2016   Last thyroid functions Lab Results  Component Value Date   TSH 1.190 05/17/2020        Objective    Vitals: BP 131/80 (BP Location: Left Arm, Patient Position: Sitting, Cuff Size: Large)   Pulse 61   Temp 98 F (36.7 C) (Oral)   Resp 16   Ht 6' (1.829 m)   Wt 196 lb 4.8 oz (89 kg)   SpO2 95%   BMI 26.62 kg/m  BP Readings from Last 3 Encounters:  05/01/22 131/80  05/17/20 129/76  09/09/19 123/74   Wt Readings from Last 3 Encounters:  05/01/22 196 lb 4.8 oz (89 kg)  05/17/20 198 lb (89.8 kg)  09/09/19 196 lb (88.9 kg)   Physical Exam Vitals  reviewed.  Constitutional:      Appearance: He is well-developed.  HENT:     Head: Normocephalic and atraumatic.  Eyes:     General: No scleral icterus.    Conjunctiva/sclera: Conjunctivae normal.  Neck:     Thyroid: No thyromegaly.  Cardiovascular:     Rate and Rhythm: Normal rate and regular rhythm.     Heart sounds: Normal heart sounds.  Pulmonary:     Effort: Pulmonary effort is normal.     Breath sounds: Normal breath sounds.  Abdominal:     Palpations: Abdomen is soft.  Musculoskeletal:     Right lower leg: No edema.     Left lower leg: No edema.  Skin:    General: Skin is warm and dry.  Neurological:     General: No focal deficit present.     Mental Status: He is alert and oriented to person, place, and time.  Psychiatric:        Mood and Affect: Mood normal.        Behavior: Behavior normal.        Thought Content: Thought content normal.        Judgment: Judgment normal.      Most recent functional status assessment:    05/01/2022    9:33 AM  In your present state of health, do you have any difficulty performing the following activities:  Hearing? 0  Vision? 0  Difficulty concentrating or making decisions? 0  Walking or climbing stairs? 1  Dressing or bathing? 0  Doing errands, shopping? 0   Most recent fall risk assessment:    05/01/2022    9:33 AM  Fall Risk   Falls in the past year? 0  Number falls in past yr: 0  Injury with Fall? 0  Risk for fall due to : No Fall Risks  Follow up Falls evaluation completed    Most recent depression screenings:    05/01/2022    9:33 AM 07/01/2019    9:18 AM  PHQ 2/9 Scores  PHQ - 2 Score 0 0  PHQ- 9 Score 0    Most recent cognitive screening:    06/24/2018    1:47 PM  6CIT Screen  What Year? 0 points  What month? 0 points  What time? 0 points  Count back from 20 0 points  Months in reverse 0 points  Repeat phrase 0 points  Total Score 0 points   Most recent Audit-C alcohol use screening     05/01/2022    9:33 AM  Alcohol Use Disorder Test (AUDIT)  1. How often do you have a drink containing alcohol? 2  2. How many drinks containing alcohol do you have on a typical day when  you are drinking? 0  3. How often do you have six or more drinks on one occasion? 0  AUDIT-C Score 2   A score of 3 or more in women, and 4 or more in men indicates increased risk for alcohol abuse, EXCEPT if all of the points are from question 1   No results found for any visits on 05/01/22.  Assessment & Plan     Annual wellness visit done today including the all of the following: Reviewed patient's Family Medical History Reviewed and updated list of patient's medical providers Assessment of cognitive impairment was done Assessed patient's functional ability Established a written schedule for health screening [place] Completed and Reviewed  Exercise Activities and Dietary recommendations  Goals      Increase water intake     Recommend increasing water intake to 4 glasses of water a day.    06/24/18- Continue to cut back on sweet tea and increase water intake to 4-6 glasses a day.         Immunization History  Administered Date(s) Administered   Fluad Quad(high Dose 65+) 05/17/2020, 05/01/2022   Influenza, High Dose Seasonal PF 04/30/2016, 05/05/2018, 04/23/2019   Influenza-Unspecified 06/12/2017   PFIZER(Purple Top)SARS-COV-2 Vaccination 08/16/2019, 09/06/2019   Pneumococcal Conjugate-13 07/19/2013, 11/10/2014   Pneumococcal Polysaccharide-23 04/30/2016   Tdap 11/05/2011, 03/19/2012   Zoster, Live 11/05/2011    Health Maintenance  Topic Date Due   Zoster Vaccines- Shingrix (1 of 2) Never done   COVID-19 Vaccine (6 - Pfizer risk series) 07/14/2021   TETANUS/TDAP  10/11/2023   Pneumonia Vaccine 20+ Years old  Completed   INFLUENZA VACCINE  Completed   HPV VACCINES  Aged Out     Discussed health benefits of physical activity, and encouraged him to engage in  regular exercise appropriate for his age and condition.    1. Encounter for subsequent annual wellness visit (AWV) in Medicare patient Patient still is independently with his wife. - Lipid Panel With LDL/HDL Ratio - Flu Vaccine QUAD High Dose(Fluad)  2. Annual physical exam Labs recently done at the [place].  3. COPD, moderate ([place]) Improved with smoking cessation.  Patient takes inhalers regularly.  4. Dyspnea on exertion Stable and improved  5. Essential hypertension Lisinopril 10.  Upon blood pressure readings  6. Hypercholesteremia On atorvastatin 40 - Lipid Panel With LDL/HDL Ratio  7. Need for influenza vaccination  - Flu Vaccine QUAD High Dose(Fluad)   No follow-ups on file.     I, Gregory Durie, MD, have reviewed all documentation for this visit. The documentation on 05/05/22 for the exam, diagnosis, procedures, and orders are all accurate and complete.    Ahyan Kreeger Cranford Mon, MD  [facility] [phone] (phone) [phone] (fax)  [place]

## 2022-05-01 ENCOUNTER — Ambulatory Visit (INDEPENDENT_AMBULATORY_CARE_PROVIDER_SITE_OTHER): Payer: Medicare HMO | Admitting: Family Medicine

## 2022-05-01 ENCOUNTER — Encounter: Payer: Self-pay | Admitting: Family Medicine

## 2022-05-01 VITALS — BP 131/80 | HR 61 | Temp 98.0°F | Resp 16 | Ht 72.0 in | Wt 196.3 lb

## 2022-05-01 DIAGNOSIS — E7800 Pure hypercholesterolemia, unspecified: Secondary | ICD-10-CM | POA: Diagnosis not present

## 2022-05-01 DIAGNOSIS — R0609 Other forms of dyspnea: Secondary | ICD-10-CM | POA: Diagnosis not present

## 2022-05-01 DIAGNOSIS — Z23 Encounter for immunization: Secondary | ICD-10-CM | POA: Diagnosis not present

## 2022-05-01 DIAGNOSIS — Z0000 Encounter for general adult medical examination without abnormal findings: Secondary | ICD-10-CM

## 2022-05-01 DIAGNOSIS — J449 Chronic obstructive pulmonary disease, unspecified: Secondary | ICD-10-CM | POA: Diagnosis not present

## 2022-05-01 DIAGNOSIS — I10 Essential (primary) hypertension: Secondary | ICD-10-CM | POA: Diagnosis not present

## 2022-05-01 MED ORDER — CARVEDILOL 12.5 MG PO TABS: 12.5000 mg | ORAL_TABLET | Freq: Two times a day (BID) | ORAL | 3 refills | Status: DC

## 2022-05-01 MED ORDER — LISINOPRIL 10 MG PO TABS: 10.0000 mg | ORAL_TABLET | Freq: Every day | ORAL | 3 refills | Status: DC

## 2022-05-01 MED ORDER — ATORVASTATIN CALCIUM 40 MG PO TABS: 40.0000 mg | ORAL_TABLET | Freq: Every evening | ORAL | 3 refills | Status: AC

## 2022-05-02 LAB — LIPID PANEL WITH LDL/HDL RATIO
Cholesterol, Total: 135 mg/dL (ref 100–199)
HDL: 49 mg/dL (ref >39)
LDL Chol Calc (NIH): 70 mg/dL (ref 0–99)
LDL/HDL Ratio: 1.4 ratio (ref 0.0–3.6)
Triglycerides: 84 mg/dL (ref 0–149)
VLDL Cholesterol Cal: 16 mg/dL (ref 5–40)

## 2022-05-21 DIAGNOSIS — J449 Chronic obstructive pulmonary disease, unspecified: Secondary | ICD-10-CM | POA: Diagnosis not present

## 2022-05-21 DIAGNOSIS — D2262 Melanocytic nevi of left upper limb, including shoulder: Secondary | ICD-10-CM | POA: Diagnosis not present

## 2022-05-21 DIAGNOSIS — D2261 Melanocytic nevi of right upper limb, including shoulder: Secondary | ICD-10-CM | POA: Diagnosis not present

## 2022-05-21 DIAGNOSIS — D2271 Melanocytic nevi of right lower limb, including hip: Secondary | ICD-10-CM | POA: Diagnosis not present

## 2022-05-21 DIAGNOSIS — D2272 Melanocytic nevi of left lower limb, including hip: Secondary | ICD-10-CM | POA: Diagnosis not present

## 2022-05-21 DIAGNOSIS — Z85828 Personal history of other malignant neoplasm of skin: Secondary | ICD-10-CM | POA: Diagnosis not present

## 2022-05-21 DIAGNOSIS — L570 Actinic keratosis: Secondary | ICD-10-CM | POA: Diagnosis not present

## 2022-05-21 DIAGNOSIS — L400 Psoriasis vulgaris: Secondary | ICD-10-CM | POA: Diagnosis not present

## 2022-06-18 DIAGNOSIS — R911 Solitary pulmonary nodule: Secondary | ICD-10-CM | POA: Diagnosis not present

## 2022-06-18 DIAGNOSIS — R918 Other nonspecific abnormal finding of lung field: Secondary | ICD-10-CM | POA: Diagnosis not present

## 2022-06-21 DIAGNOSIS — J449 Chronic obstructive pulmonary disease, unspecified: Secondary | ICD-10-CM | POA: Diagnosis not present

## 2022-07-01 ENCOUNTER — Ambulatory Visit (INDEPENDENT_AMBULATORY_CARE_PROVIDER_SITE_OTHER): Payer: Medicare HMO | Admitting: Physician Assistant

## 2022-07-01 ENCOUNTER — Encounter: Payer: Self-pay | Admitting: Physician Assistant

## 2022-07-01 VITALS — BP 132/78 | HR 90 | Wt 190.0 lb

## 2022-07-01 DIAGNOSIS — J441 Chronic obstructive pulmonary disease with (acute) exacerbation: Secondary | ICD-10-CM | POA: Diagnosis not present

## 2022-07-01 MED ORDER — AMOXICILLIN-POT CLAVULANATE 875-125 MG PO TABS: 1.0000 | ORAL_TABLET | Freq: Two times a day (BID) | ORAL | 0 refills | Status: AC

## 2022-07-01 MED ORDER — ALBUTEROL SULFATE HFA 108 (90 BASE) MCG/ACT IN AERS: 1.0000 | INHALATION_SPRAY | RESPIRATORY_TRACT | 3 refills | Status: AC | PRN

## 2022-07-01 NOTE — Progress Notes (Signed)
I,Sha'taria Tyson,acting as a Education administrator for [employer], PA-C.,have documented all relevant documentation on the behalf of Gregory Kirschner, PA-C,as directed by  Gregory Kirschner, PA-C while in the presence of Gregory Kirschner, PA-C.  Established patient visit   Patient: Gregory Haynes   DOB: Oct 28, 1940   81 y.o. Male  MRN: [id number] Visit Date: 07/01/2022  Today's healthcare provider: [name], PA-C   Cc. Cough, nasal congestion x 10 days  Subjective    HPI   Pt is a 81 y/o male with COPD on 2L O2 for sleep and exertion who presents with cough, nasal congestion, fatigue x 10 days. Pt denies chest tightness, SOB, fevers, chills.  Denies taking any OTC meds. Medications: Outpatient Medications Prior to Visit  Medication Sig   atorvastatin (LIPITOR) 40 MG tablet Take 1 tablet (40 mg total) by mouth every evening.   carvedilol (COREG) 12.5 MG tablet Take 1 tablet (12.5 mg total) by mouth 2 (two) times daily with a meal.   Fluticasone-Umeclidin-Vilant (TRELEGY ELLIPTA) 100-62.5-25 MCG/INH AEPB Inhale 1 puff into the lungs daily.   folic acid (FOLVITE) 1 MG tablet Take 1 mg by mouth daily. Unsure dose   hydrochlorothiazide (MICROZIDE) 12.5 MG capsule    lisinopril (ZESTRIL) 10 MG tablet Take 1 tablet (10 mg total) by mouth daily.   OXYGEN Inhale 2 L/hr into the lungs. At night and as needed during day.   [DISCONTINUED] albuterol (VENTOLIN HFA) 108 (90 Base) MCG/ACT inhaler Inhale into the lungs.   No facility-administered medications prior to visit.    Review of Systems  Constitutional:  Negative for fatigue and fever.  HENT:  Positive for congestion and rhinorrhea.   Respiratory:  Positive for cough. Negative for shortness of breath.   Cardiovascular:  Negative for chest pain, palpitations and leg swelling.  Neurological:  Negative for dizziness and headaches.      Objective    Blood pressure 132/78, pulse 90, weight 190 lb (86.2 kg), SpO2 94 %.   Physical  Exam Constitutional:      General: He is awake.     Appearance: He is well-developed.  HENT:     Head: Normocephalic.  Eyes:     Conjunctiva/sclera: Conjunctivae normal.  Cardiovascular:     Rate and Rhythm: Normal rate and regular rhythm.     Heart sounds: Normal heart sounds.  Pulmonary:     Effort: Pulmonary effort is normal.     Breath sounds: Normal breath sounds. No stridor. No wheezing, rhonchi or rales.  Skin:    General: Skin is warm.  Neurological:     Mental Status: He is alert and oriented to person, place, and time.  Psychiatric:        Attention and Perception: Attention normal.        Mood and Affect: Mood normal.        Speech: Speech normal.        Behavior: Behavior is cooperative.     No results found for any visits on 07/01/22.  Assessment & Plan     COPD exacerbation Lung exam clear, O2 94% RA Refilled albuterol inhaler Recommending increasing fluids, mucinex otc. Rx augmentin 875 mg bid x 7 days  Return if symptoms worsen or fail to improve.      I, Gregory Kirschner, PA-C have reviewed all documentation for this visit. The documentation on  07/01/2022 for the exam, diagnosis, procedures, and orders are all accurate and complete.  Gregory Kirschner, PA-C [facility]  [address] [place], [place], [address] Office: [phone] Fax: [place]

## 2022-07-01 NOTE — Patient Instructions (Addendum)
Mucinex 12 hour over the counter

## 2022-07-21 DIAGNOSIS — J449 Chronic obstructive pulmonary disease, unspecified: Secondary | ICD-10-CM | POA: Diagnosis not present

## 2022-08-21 DIAGNOSIS — J449 Chronic obstructive pulmonary disease, unspecified: Secondary | ICD-10-CM | POA: Diagnosis not present

## 2022-09-01 DIAGNOSIS — Z7951 Long term (current) use of inhaled steroids: Secondary | ICD-10-CM | POA: Diagnosis not present

## 2022-09-01 DIAGNOSIS — E538 Deficiency of other specified B group vitamins: Secondary | ICD-10-CM | POA: Diagnosis not present

## 2022-09-01 DIAGNOSIS — Z87891 Personal history of nicotine dependence: Secondary | ICD-10-CM | POA: Diagnosis not present

## 2022-09-01 DIAGNOSIS — J449 Chronic obstructive pulmonary disease, unspecified: Secondary | ICD-10-CM | POA: Diagnosis not present

## 2022-09-01 DIAGNOSIS — L309 Dermatitis, unspecified: Secondary | ICD-10-CM | POA: Diagnosis not present

## 2022-09-01 DIAGNOSIS — Z008 Encounter for other general examination: Secondary | ICD-10-CM | POA: Diagnosis not present

## 2022-09-01 DIAGNOSIS — E785 Hyperlipidemia, unspecified: Secondary | ICD-10-CM | POA: Diagnosis not present

## 2022-09-01 DIAGNOSIS — I10 Essential (primary) hypertension: Secondary | ICD-10-CM | POA: Diagnosis not present

## 2022-09-01 DIAGNOSIS — M1990 Unspecified osteoarthritis, unspecified site: Secondary | ICD-10-CM | POA: Diagnosis not present

## 2022-09-09 DIAGNOSIS — J438 Other emphysema: Secondary | ICD-10-CM | POA: Diagnosis not present

## 2022-09-09 DIAGNOSIS — I739 Peripheral vascular disease, unspecified: Secondary | ICD-10-CM | POA: Diagnosis not present

## 2022-09-09 DIAGNOSIS — I25118 Atherosclerotic heart disease of native coronary artery with other forms of angina pectoris: Secondary | ICD-10-CM | POA: Diagnosis not present

## 2022-09-09 DIAGNOSIS — G4733 Obstructive sleep apnea (adult) (pediatric): Secondary | ICD-10-CM | POA: Diagnosis not present

## 2022-09-09 DIAGNOSIS — R0609 Other forms of dyspnea: Secondary | ICD-10-CM | POA: Diagnosis not present

## 2022-09-21 DIAGNOSIS — J449 Chronic obstructive pulmonary disease, unspecified: Secondary | ICD-10-CM | POA: Diagnosis not present

## 2022-09-27 ENCOUNTER — Encounter: Payer: Self-pay | Admitting: Physician Assistant

## 2022-09-27 DIAGNOSIS — I739 Peripheral vascular disease, unspecified: Secondary | ICD-10-CM | POA: Insufficient documentation

## 2022-09-30 DIAGNOSIS — R0609 Other forms of dyspnea: Secondary | ICD-10-CM | POA: Diagnosis not present

## 2022-09-30 DIAGNOSIS — J449 Chronic obstructive pulmonary disease, unspecified: Secondary | ICD-10-CM | POA: Diagnosis not present

## 2022-09-30 DIAGNOSIS — G4733 Obstructive sleep apnea (adult) (pediatric): Secondary | ICD-10-CM | POA: Diagnosis not present

## 2022-09-30 DIAGNOSIS — J9612 Chronic respiratory failure with hypercapnia: Secondary | ICD-10-CM | POA: Diagnosis not present

## 2022-09-30 DIAGNOSIS — R911 Solitary pulmonary nodule: Secondary | ICD-10-CM | POA: Diagnosis not present

## 2022-10-04 DIAGNOSIS — H9190 Unspecified hearing loss, unspecified ear: Secondary | ICD-10-CM | POA: Insufficient documentation

## 2022-10-04 DIAGNOSIS — IMO0001 Reserved for inherently not codable concepts without codable children: Secondary | ICD-10-CM | POA: Insufficient documentation

## 2022-10-09 ENCOUNTER — Other Ambulatory Visit: Payer: Self-pay | Admitting: Physician Assistant

## 2022-10-09 DIAGNOSIS — I25118 Atherosclerotic heart disease of native coronary artery with other forms of angina pectoris: Secondary | ICD-10-CM | POA: Diagnosis not present

## 2022-10-09 DIAGNOSIS — J441 Chronic obstructive pulmonary disease with (acute) exacerbation: Secondary | ICD-10-CM

## 2022-10-09 DIAGNOSIS — I10 Essential (primary) hypertension: Secondary | ICD-10-CM | POA: Diagnosis not present

## 2022-10-09 DIAGNOSIS — L4050 Arthropathic psoriasis, unspecified: Secondary | ICD-10-CM | POA: Diagnosis not present

## 2022-10-09 DIAGNOSIS — J432 Centrilobular emphysema: Secondary | ICD-10-CM | POA: Diagnosis not present

## 2022-10-20 DIAGNOSIS — J449 Chronic obstructive pulmonary disease, unspecified: Secondary | ICD-10-CM | POA: Diagnosis not present

## 2022-10-28 ENCOUNTER — Emergency Department: Payer: Medicare HMO

## 2022-10-28 ENCOUNTER — Other Ambulatory Visit: Payer: Self-pay

## 2022-10-28 ENCOUNTER — Inpatient Hospital Stay
Admission: EM | Admit: 2022-10-28 | Discharge: 2022-11-12 | DRG: 190 | Disposition: A | Discharge status: Home / Self Care | Payer: Medicare HMO | Attending: Internal Medicine | Admitting: Internal Medicine

## 2022-10-28 DIAGNOSIS — J441 Chronic obstructive pulmonary disease with (acute) exacerbation: Secondary | ICD-10-CM | POA: Diagnosis not present

## 2022-10-28 DIAGNOSIS — T380X5A Adverse effect of glucocorticoids and synthetic analogues, initial encounter: Secondary | ICD-10-CM | POA: Diagnosis not present

## 2022-10-28 DIAGNOSIS — Z79899 Other long term (current) drug therapy: Secondary | ICD-10-CM

## 2022-10-28 DIAGNOSIS — Z83438 Family history of other disorder of lipoprotein metabolism and other lipidemia: Secondary | ICD-10-CM

## 2022-10-28 DIAGNOSIS — I10 Essential (primary) hypertension: Secondary | ICD-10-CM | POA: Diagnosis present

## 2022-10-28 DIAGNOSIS — I25118 Atherosclerotic heart disease of native coronary artery with other forms of angina pectoris: Secondary | ICD-10-CM | POA: Diagnosis present

## 2022-10-28 DIAGNOSIS — R531 Weakness: Secondary | ICD-10-CM

## 2022-10-28 DIAGNOSIS — Z87891 Personal history of nicotine dependence: Secondary | ICD-10-CM

## 2022-10-28 DIAGNOSIS — Z85828 Personal history of other malignant neoplasm of skin: Secondary | ICD-10-CM

## 2022-10-28 DIAGNOSIS — R0689 Other abnormalities of breathing: Secondary | ICD-10-CM

## 2022-10-28 DIAGNOSIS — R001 Bradycardia, unspecified: Secondary | ICD-10-CM | POA: Diagnosis not present

## 2022-10-28 DIAGNOSIS — R0602 Shortness of breath: Secondary | ICD-10-CM | POA: Diagnosis not present

## 2022-10-28 DIAGNOSIS — Z974 Presence of external hearing-aid: Secondary | ICD-10-CM

## 2022-10-28 DIAGNOSIS — J9621 Acute and chronic respiratory failure with hypoxia: Secondary | ICD-10-CM | POA: Diagnosis not present

## 2022-10-28 DIAGNOSIS — R000 Tachycardia, unspecified: Secondary | ICD-10-CM | POA: Diagnosis not present

## 2022-10-28 DIAGNOSIS — J431 Panlobular emphysema: Secondary | ICD-10-CM | POA: Diagnosis present

## 2022-10-28 DIAGNOSIS — J440 Chronic obstructive pulmonary disease with acute lower respiratory infection: Secondary | ICD-10-CM | POA: Diagnosis present

## 2022-10-28 DIAGNOSIS — J189 Pneumonia, unspecified organism: Secondary | ICD-10-CM | POA: Diagnosis not present

## 2022-10-28 DIAGNOSIS — R413 Other amnesia: Secondary | ICD-10-CM | POA: Insufficient documentation

## 2022-10-28 DIAGNOSIS — E785 Hyperlipidemia, unspecified: Secondary | ICD-10-CM

## 2022-10-28 DIAGNOSIS — J9622 Acute and chronic respiratory failure with hypercapnia: Secondary | ICD-10-CM | POA: Diagnosis not present

## 2022-10-28 DIAGNOSIS — Z8249 Family history of ischemic heart disease and other diseases of the circulatory system: Secondary | ICD-10-CM

## 2022-10-28 DIAGNOSIS — E8729 Other acidosis: Secondary | ICD-10-CM | POA: Diagnosis not present

## 2022-10-28 DIAGNOSIS — Z743 Need for continuous supervision: Secondary | ICD-10-CM | POA: Diagnosis not present

## 2022-10-28 DIAGNOSIS — T17928A Food in respiratory tract, part unspecified causing other injury, initial encounter: Secondary | ICD-10-CM | POA: Diagnosis not present

## 2022-10-28 DIAGNOSIS — E870 Hyperosmolality and hypernatremia: Secondary | ICD-10-CM | POA: Diagnosis present

## 2022-10-28 DIAGNOSIS — Z7951 Long term (current) use of inhaled steroids: Secondary | ICD-10-CM

## 2022-10-28 DIAGNOSIS — I959 Hypotension, unspecified: Secondary | ICD-10-CM | POA: Diagnosis not present

## 2022-10-28 DIAGNOSIS — R490 Dysphonia: Secondary | ICD-10-CM | POA: Diagnosis not present

## 2022-10-28 DIAGNOSIS — F419 Anxiety disorder, unspecified: Secondary | ICD-10-CM | POA: Diagnosis present

## 2022-10-28 DIAGNOSIS — Z9842 Cataract extraction status, left eye: Secondary | ICD-10-CM

## 2022-10-28 DIAGNOSIS — W44F3XA Food entering into or through a natural orifice, initial encounter: Secondary | ICD-10-CM | POA: Diagnosis not present

## 2022-10-28 DIAGNOSIS — Y92239 Unspecified place in hospital as the place of occurrence of the external cause: Secondary | ICD-10-CM | POA: Diagnosis not present

## 2022-10-28 DIAGNOSIS — E7849 Other hyperlipidemia: Secondary | ICD-10-CM | POA: Diagnosis present

## 2022-10-28 DIAGNOSIS — G928 Other toxic encephalopathy: Secondary | ICD-10-CM

## 2022-10-28 DIAGNOSIS — Z1152 Encounter for screening for COVID-19: Secondary | ICD-10-CM

## 2022-10-28 DIAGNOSIS — R5381 Other malaise: Secondary | ICD-10-CM | POA: Diagnosis present

## 2022-10-28 DIAGNOSIS — R269 Unspecified abnormalities of gait and mobility: Secondary | ICD-10-CM | POA: Diagnosis present

## 2022-10-28 DIAGNOSIS — R739 Hyperglycemia, unspecified: Secondary | ICD-10-CM | POA: Diagnosis not present

## 2022-10-28 DIAGNOSIS — J449 Chronic obstructive pulmonary disease, unspecified: Secondary | ICD-10-CM | POA: Diagnosis not present

## 2022-10-28 DIAGNOSIS — Z91199 Patient's noncompliance with other medical treatment and regimen due to unspecified reason: Secondary | ICD-10-CM

## 2022-10-28 LAB — CBC
HCT: 46 % (ref 39.0–52.0)
Hemoglobin: 14.7 g/dL (ref 13.0–17.0)
MCH: 30.4 pg (ref 26.0–34.0)
MCHC: 32 g/dL (ref 30.0–36.0)
MCV: 95.2 fL (ref 80.0–100.0)
Platelets: 311 10*3/uL (ref 150–400)
RBC: 4.83 MIL/uL (ref 4.22–5.81)
RDW: 13.1 % (ref 11.5–15.5)
WBC: 12.5 10*3/uL — ABNORMAL HIGH (ref 4.0–10.5)
nRBC: 0 % (ref 0.0–0.2)

## 2022-10-28 LAB — BASIC METABOLIC PANEL
Anion gap: 11 (ref 5–15)
BUN: 22 mg/dL (ref 8–23)
CO2: 31 mmol/L (ref 22–32)
Calcium: 9.3 mg/dL (ref 8.9–10.3)
Chloride: 93 mmol/L — ABNORMAL LOW (ref 98–111)
Creatinine, Ser: 0.97 mg/dL (ref 0.61–1.24)
GFR, Estimated: >60 mL/min (ref >60)
Glucose, Bld: 191 mg/dL — ABNORMAL HIGH (ref 70–99)
Potassium: 4.2 mmol/L (ref 3.5–5.1)
Sodium: 135 mmol/L (ref 135–145)

## 2022-10-28 LAB — TROPONIN I (HIGH SENSITIVITY): Troponin I (High Sensitivity): 13 ng/L (ref <18)

## 2022-10-28 LAB — RESP PANEL BY RT-PCR (RSV, FLU A&B, COVID)  RVPGX2
Influenza A by PCR: NEGATIVE
Influenza B by PCR: NEGATIVE
Resp Syncytial Virus by PCR: NEGATIVE
SARS Coronavirus 2 by RT PCR: NEGATIVE

## 2022-10-28 MED ORDER — CARVEDILOL 12.5 MG PO TABS
12.5000 mg | ORAL_TABLET | Freq: Two times a day (BID) | ORAL | Status: DC
  Administered 2022-10-28 – 2022-11-08 (×17): 12.5 mg via ORAL
  Filled 2022-10-28: qty 2
  Filled 2022-10-28 (×17): qty 1

## 2022-10-28 MED ORDER — MAGNESIUM SULFATE 2 GM/50ML IV SOLN
2.0000 g | Freq: Once | INTRAVENOUS | Status: AC
  Administered 2022-10-28: 2 g via INTRAVENOUS
  Filled 2022-10-28: qty 50

## 2022-10-28 MED ORDER — PREDNISONE 20 MG PO TABS
40.0000 mg | ORAL_TABLET | Freq: Every day | ORAL | Status: DC
  Administered 2022-10-29 – 2022-10-30 (×2): 40 mg via ORAL
  Filled 2022-10-28 (×3): qty 2

## 2022-10-28 MED ORDER — ENOXAPARIN SODIUM 40 MG/0.4ML IJ SOSY: 40.0000 mg | PREFILLED_SYRINGE | INTRAMUSCULAR | Status: DC

## 2022-10-28 MED ORDER — SODIUM CHLORIDE 0.9 % IV SOLN
500.0000 mg | INTRAVENOUS | Status: AC
  Administered 2022-10-28: 500 mg via INTRAVENOUS
  Filled 2022-10-28: qty 5

## 2022-10-28 MED ORDER — IPRATROPIUM-ALBUTEROL 0.5-2.5 (3) MG/3ML IN SOLN
3.0000 mL | Freq: Once | RESPIRATORY_TRACT | Status: AC
  Administered 2022-10-28: 3 mL via RESPIRATORY_TRACT
  Filled 2022-10-28: qty 3

## 2022-10-28 MED ORDER — LISINOPRIL 5 MG PO TABS
10.0000 mg | ORAL_TABLET | Freq: Every day | ORAL | Status: DC
  Administered 2022-10-28 – 2022-10-30 (×3): 10 mg via ORAL
  Filled 2022-10-28 (×4): qty 1

## 2022-10-28 MED ORDER — AZITHROMYCIN 250 MG PO TABS
500.0000 mg | ORAL_TABLET | Freq: Every day | ORAL | Status: DC
  Administered 2022-10-29 – 2022-10-30 (×2): 500 mg via ORAL
  Filled 2022-10-28 (×3): qty 2

## 2022-10-28 MED ORDER — METHYLPREDNISOLONE SODIUM SUCC 125 MG IJ SOLR
125.0000 mg | INTRAMUSCULAR | Status: AC
  Administered 2022-10-28: 125 mg via INTRAVENOUS
  Filled 2022-10-28: qty 2

## 2022-10-28 MED ORDER — SODIUM CHLORIDE 0.9 % IV SOLN: INTRAVENOUS | Status: DC

## 2022-10-28 MED ORDER — ATORVASTATIN CALCIUM 20 MG PO TABS
40.0000 mg | ORAL_TABLET | Freq: Every evening | ORAL | Status: DC
  Administered 2022-10-28 – 2022-11-11 (×13): 40 mg via ORAL
  Filled 2022-10-28 (×13): qty 2

## 2022-10-28 MED ORDER — ONDANSETRON HCL 4 MG/2ML IJ SOLN: 4.0000 mg | Freq: Four times a day (QID) | INTRAMUSCULAR | Status: DC | PRN

## 2022-10-28 MED ORDER — ONDANSETRON HCL 4 MG PO TABS: 4.0000 mg | ORAL_TABLET | Freq: Four times a day (QID) | ORAL | Status: DC | PRN

## 2022-10-28 MED ORDER — ALBUTEROL SULFATE (2.5 MG/3ML) 0.083% IN NEBU: 2.5000 mg | INHALATION_SOLUTION | RESPIRATORY_TRACT | Status: DC | PRN

## 2022-10-28 NOTE — Assessment & Plan Note (Signed)
Cont statin

## 2022-10-28 NOTE — Assessment & Plan Note (Signed)
No active CP  Trop stable  EKG w/ sinus tach and nonspecific changes in setting of COPD flare  Monitor for now

## 2022-10-28 NOTE — ED Provider Notes (Signed)
[facility] Provider Note    Event Date/Time   First MD Initiated Contact with Patient 10/28/22 1346     (approximate)   History   Shortness of Breath   HPI  Gregory Haynes is a 82 y.o. male with a history of stage IV COPD who uses oxygen at home as needed, typically is able to do well without it presents with worsening shortness of breath.  Patient reports he has been having to use his oxygen nearly continuously now over the last several days.  Denies fevers or chills, occasional cough.  No lower extremity swelling.     Physical Exam   Triage Vital Signs: ED Triage Vitals  Enc Vitals Group     BP 10/28/22 1218 (!) 169/90     Pulse Rate 10/28/22 1218 (!) 107     Resp 10/28/22 1218 (!) 22     Temp 10/28/22 1218 98.2 F (36.8 C)     Temp Source 10/28/22 1218 Oral     SpO2 10/28/22 1218 92 %     Weight --      Height --      Head Circumference --      Peak Flow --      Pain Score 10/28/22 1220 0     Pain Loc --      Pain Edu? --      Excl. in [place]? --     Most recent vital signs: Vitals:   10/28/22 1218  BP: (!) 169/90  Pulse: (!) 107  Resp: (!) 22  Temp: 98.2 F (36.8 C)  SpO2: 92%     General: Awake, no distress.  CV:  Good peripheral perfusion.  Resp:  Increased effort wheezing with poor airflow, tachypnea Abd:  No distention.  Other:  No lower extremity edema   ED Results / Procedures / Treatments   Labs (all labs ordered are listed, but only abnormal results are displayed) Labs Reviewed  BASIC METABOLIC PANEL - Abnormal; Notable for the following components:      Result Value   Chloride 93 (*)    Glucose, Bld 191 (*)    All other components within normal limits  CBC - Abnormal; Notable for the following components:   WBC 12.5 (*)    All other components within normal limits  RESP PANEL BY RT-PCR (RSV, FLU A&B, COVID)  [id number]  TROPONIN I (HIGH SENSITIVITY)  TROPONIN I (HIGH SENSITIVITY)     EKG  ED ECG  REPORT I, Lavonia Drafts, the attending physician, personally viewed and interpreted this ECG.  Date: 10/28/2022  Rhythm: normal sinus rhythm QRS Axis: normal Intervals: normal ST/T Wave abnormalities: Nonspecific changes Narrative Interpretation: no evidence of acute ischemia    RADIOLOGY Chest x-ray no evidence of pneumonia    PROCEDURES:  Critical Care performed: yes  CRITICAL CARE Performed by: Lavonia Drafts   Total critical care time: 30 minutes  Critical care time was exclusive of separately billable procedures and treating other patients.  Critical care was necessary to treat or prevent imminent or life-threatening deterioration.  Critical care was time spent personally by me on the following activities: development of treatment plan with patient and/or surrogate as well as nursing, discussions with consultants, evaluation of patient's response to treatment, examination of patient, obtaining history from patient or surrogate, ordering and performing treatments and interventions, ordering and review of laboratory studies, ordering and review of radiographic studies, pulse oximetry and re-evaluation of patient's condition.   Procedures  MEDICATIONS ORDERED IN ED: Medications  magnesium sulfate IVPB 2 g 50 mL (2 g Intravenous New Bag/Given 10/28/22 1407)  ipratropium-albuterol (DUONEB) 0.5-2.5 (3) MG/3ML nebulizer solution 3 mL (3 mLs Nebulization Given 10/28/22 1408)  ipratropium-albuterol (DUONEB) 0.5-2.5 (3) MG/3ML nebulizer solution 3 mL (3 mLs Nebulization Given 10/28/22 1408)     IMPRESSION / MDM / ASSESSMENT AND PLAN / ED COURSE  I reviewed the triage vital signs and the nursing notes. Patient's presentation is most cosistent with severe exacerbation of chronic illness.   Patient presents with shortness of breath in the setting of severe COPD, he is wheezing with poor airflow most consistent with significant COPD exacerbation, he is hypoxic without oxygen  which is a big change for him.  Chest x-ray without evidence of pneumonia,.  He is afebrile, lab work reviewed and is overall reassuring.  Patient treated with DuoNebs, Solu-Medrol, IV magnesium with little improvement, he will require admission for further treatment.       FINAL CLINICAL IMPRESSION(S) / ED DIAGNOSES   Final diagnoses:  COPD exacerbation ([place])  Acute on chronic respiratory failure with hypoxia ([place])     Rx / DC Orders   ED Discharge Orders     None        Note:  This document was prepared using Dragon voice recognition software and may include unintentional dictation errors.   Lavonia Drafts, MD 10/28/22 1500

## 2022-10-28 NOTE — ED Triage Notes (Signed)
First nurse note: Pt to ED via [facility] from home. EMS reports RA 70%. Pt is to wear 2L [place] chronically but was not wearing [place] on arrival. Pt reports SOB x2 days. Pt with hx COPD. Pt given 125mg  solumedrol and 1 breathing tx PTA.

## 2022-10-28 NOTE — Assessment & Plan Note (Addendum)
Decompensated resp status x 4-5 days as pt reports not wearing O2 while on vacation  + wheezing, increased WOB  CXR grossly stable  IV solumedrol  IV azithromycin  Duonebs  Cont supplementalO2  Discussed importance of O2 and medication compliance  Follow

## 2022-10-28 NOTE — ED Triage Notes (Signed)
Pt co of shob x 2 days. Pt states hx of COPD. Pt states he wears 2L [place] at baseline. Pt co of new cough, pt denies coughing up sputum. Pt is A&Ox4 on arrival. Pt denies pain on arrival. Pt 92% on 2L at this time.

## 2022-10-28 NOTE — Assessment & Plan Note (Signed)
Cont home regimen

## 2022-10-28 NOTE — Assessment & Plan Note (Addendum)
Baseline COPD w/ home O2 use at 2L Has not been compliant w/ O2 while on recent vacation per wife  Now requiring 2-3L [place] to keep O2 sats>92% + increased WOB and wheezing  CXR stable  Covid, flu and RSV negative  Start IV solumedrol, IV azithromycin, duonebs  Supplemental O2 prn  Discussed importance of O2 and medication compliance

## 2022-10-28 NOTE — H&P (Signed)
History and Physical    Patient: Gregory Haynes [id number] DOB: 01/01/1941 DOA: 10/28/2022 DOS: the patient was seen and examined on 10/28/2022 PCP: Eulas Post, MD  Patient coming from: Home  Chief Complaint:  Chief Complaint  Patient presents with   Shortness of Breath   HPI: Gregory Haynes is a 82 y.o. male with medical history significant of chronic respiratory failure on 2 L, COPD, hypertension, hyperlipidemia presenting with acute on chronic respiratory failure with hypoxia and COPD exacerbation.  Patient reports increased work of breathing over the past 3 to 4 days.  Wife at at the bedside reporting similar.  Positive wheezing and increased work of breathing.  No reported sick contacts.  Wife reports that her and the patient went on vacation to the beach.  Patient was noncompliant with wearing his oxygen while on vacation.  No fevers or chills.  Quit smoking 11 years ago.  No chest pain.  No nausea or vomiting.  Has had increased O2 requirements as well as increased inhaler use at home.  Wife reports a significant event of COPD exacerbation almost 10 years ago that required and patient being transferred to [facility] because of declining respiratory status.  Declining respiratory status. Presented to the ER afebrile, hemodynamically stable.  Requiring 2 to 3 L to keep O2 sats greater than 92%.  White count 12.5, hemoglobin 14.7, platelets 311.  Creatinine 0.97.  Glucose 191.  Troponin negative x 1.  COVID flu and RSV negative.  Chest x-ray grossly stable. Review of Systems: As mentioned in the history of present illness. All other systems reviewed and are negative. Past Medical History:  Diagnosis Date   Anemia    Cancer ([place])    BCC   Cataract    COPD (chronic obstructive pulmonary disease) ([place])    Helicobacter pylori ab+    Hyperlipidemia    Hypertension    Psoriasis    Wears hearing aid in both ears    Past Surgical History:  Procedure Laterality Date   CATARACT  EXTRACTION W/PHACO Left 08/18/2019   Procedure: CATARACT EXTRACTION PHACO AND INTRAOCULAR LENS PLACEMENT (IOC) LEFT 6.68,   00:54.1,   30.8%;  Surgeon: Leandrew Koyanagi, MD;  Location: [facility];  Service: Ophthalmology;  Laterality: Left;   COLONOSCOPY     Social History:  reports that he quit smoking about 11 years ago. His smoking use included cigarettes. He has a 82.50 pack-year smoking history. He has never used smokeless tobacco. He reports current alcohol use. He reports that he does not use drugs.  No Known Allergies  Family History  Problem Relation Age of Onset   Heart attack Father    Heart disease Brother    Hyperlipidemia Brother    Alcohol abuse Brother     Prior to Admission medications   Medication Sig Start Date End Date Taking? Authorizing Provider  albuterol (VENTOLIN HFA) 108 (90 Base) MCG/ACT inhaler Inhale 1-2 puffs into the lungs every 4 (four) hours as needed for wheezing or shortness of breath. 07/01/22  Yes [name], PA-C  atorvastatin (LIPITOR) 40 MG tablet Take 1 tablet (40 mg total) by mouth every evening. 05/01/22  Yes Eulas Post, MD  carvedilol (COREG) 12.5 MG tablet Take 1 tablet (12.5 mg total) by mouth 2 (two) times daily with a meal. 05/01/22  Yes Eulas Post, MD  clobetasol cream (TEMOVATE) [id number] % Apply 1 Application topically 2 (two) times daily.   Yes [provider]  Fluticasone-Umeclidin-Vilant (TRELEGY ELLIPTA)  100-62.5-25 MCG/INH AEPB Inhale 1 puff into the lungs daily. 08/26/17  Yes [provider]  folic acid (FOLVITE) 1 MG tablet Take 1 mg by mouth daily. Unsure dose   Yes [provider]  lisinopril (ZESTRIL) 10 MG tablet Take 1 tablet (10 mg total) by mouth daily. 05/01/22  Yes Eulas Post, MD  hydrochlorothiazide (MICROZIDE) 12.5 MG capsule  07/30/19   [provider]  OXYGEN Inhale 2 L/hr into the lungs. At night and as needed during day.    [provider]     Physical Exam: Vitals:   10/28/22 1218  BP: (!) 169/90  Pulse: (!) 107  Resp: (!) 22  Temp: 98.2 F (36.8 C)  TempSrc: Oral  SpO2: 92%   Physical Exam Constitutional:      General: He is not in acute distress.    Appearance: He is normal weight.  HENT:     Head: Normocephalic and atraumatic.     Nose: Nose normal.  Eyes:     Pupils: Pupils are equal, round, and reactive to light.  Cardiovascular:     Rate and Rhythm: Normal rate and regular rhythm.  Pulmonary:     Effort: Pulmonary effort is normal.     Breath sounds: Wheezing present.  Abdominal:     General: Bowel sounds are normal.  Musculoskeletal:        General: Normal range of motion.  Skin:    General: Skin is warm.  Neurological:     General: No focal deficit present.  Psychiatric:        Mood and Affect: Mood normal.     Data Reviewed:  There are no new results to review at this time. Lab Results  Component Value Date   WBC 12.5 (H) 10/28/2022   HGB 14.7 10/28/2022   HCT 46.0 10/28/2022   MCV 95.2 10/28/2022   PLT 311 [id number]   Last metabolic panel Lab Results  Component Value Date   GLUCOSE 191 (H) 10/28/2022   NA 135 10/28/2022   K 4.2 10/28/2022   CL 93 (L) 10/28/2022   CO2 31 10/28/2022   BUN 22 10/28/2022   CREATININE 0.97 10/28/2022   GFRNONAA >60 10/28/2022   CALCIUM 9.3 10/28/2022   PROT 6.8 05/17/2020   ALBUMIN 4.5 05/17/2020   LABGLOB 2.3 05/17/2020   AGRATIO 2.0 05/17/2020   BILITOT 0.5 05/17/2020   ALKPHOS 81 05/17/2020   AST 19 05/17/2020   ALT 12 05/17/2020   ANIONGAP 11 10/28/2022   DG Chest 2 View CLINICAL DATA:  Shortness of breath  EXAM: CHEST - 2 VIEW  COMPARISON:  03/07/2012  FINDINGS: The heart size and mediastinal contours are within normal limits. Aortic atherosclerosis. Hyperinflated lungs with coarsened interstitial markings bilaterally. No focal airspace consolidation. No pleural effusion or pneumothorax. The visualized skeletal structures  are unremarkable.  IMPRESSION: Findings of COPD. No focal airspace consolidation.  Electronically Signed   By: Davina Poke D.O.   On: 10/28/2022 13:11   Assessment and Plan: * COPD exacerbation ([place]) Decompensated resp status x 4-5 days as pt reports not wearing O2 while on vacation  + wheezing, increased WOB  CXR grossly stable  IV solumedrol  IV azithromycin  Duonebs  Cont supplementalO2  Discussed importance of O2 and medication compliance  Follow    Acute on chronic respiratory failure with hypoxia (HCC) Baseline COPD w/ home O2 use at 2L Has not been compliant w/ O2 while on recent vacation per wife  Now requiring  2-3L [place] to keep O2 sats>92% + increased WOB and wheezing  CXR stable  Covid, flu and RSV negative  Start IV solumedrol, IV azithromycin, duonebs  Supplemental O2 prn  Discussed importance of O2 and medication compliance   Coronary artery disease of native artery of native heart with stable angina pectoris (HCC) No active CP  Trop stable  EKG w/ sinus tach and nonspecific changes in setting of COPD flare  Monitor for now    HTN, goal below 130/80 Cont home regimen    Other hyperlipidemia Cont statin        Advance Care Planning:   Code Status: Full Code   Consults: None   Family Communication: Wife at the bedside   Severity of Illness: The appropriate patient status for this patient is OBSERVATION. Observation status is judged to be reasonable and necessary in order to provide the required intensity of service to ensure the patient's safety. The patient's presenting symptoms, physical exam findings, and initial radiographic and laboratory data in the context of their medical condition is felt to place them at decreased risk for further clinical deterioration. Furthermore, it is anticipated that the patient will be medically stable for discharge from the hospital within 2 midnights of admission.   Author: Deneise Lever, MD 10/28/2022  5:02 PM  For on call review [email or web address].

## 2022-10-29 DIAGNOSIS — R918 Other nonspecific abnormal finding of lung field: Secondary | ICD-10-CM | POA: Diagnosis not present

## 2022-10-29 DIAGNOSIS — R0689 Other abnormalities of breathing: Secondary | ICD-10-CM | POA: Diagnosis not present

## 2022-10-29 DIAGNOSIS — Z87891 Personal history of nicotine dependence: Secondary | ICD-10-CM | POA: Diagnosis not present

## 2022-10-29 DIAGNOSIS — G9340 Encephalopathy, unspecified: Secondary | ICD-10-CM | POA: Diagnosis not present

## 2022-10-29 DIAGNOSIS — R413 Other amnesia: Secondary | ICD-10-CM | POA: Diagnosis not present

## 2022-10-29 DIAGNOSIS — Z7951 Long term (current) use of inhaled steroids: Secondary | ICD-10-CM | POA: Diagnosis not present

## 2022-10-29 DIAGNOSIS — R531 Weakness: Secondary | ICD-10-CM

## 2022-10-29 DIAGNOSIS — J441 Chronic obstructive pulmonary disease with (acute) exacerbation: Secondary | ICD-10-CM | POA: Diagnosis not present

## 2022-10-29 DIAGNOSIS — Z85828 Personal history of other malignant neoplasm of skin: Secondary | ICD-10-CM | POA: Diagnosis not present

## 2022-10-29 DIAGNOSIS — K439 Ventral hernia without obstruction or gangrene: Secondary | ICD-10-CM | POA: Diagnosis not present

## 2022-10-29 DIAGNOSIS — Z9842 Cataract extraction status, left eye: Secondary | ICD-10-CM | POA: Diagnosis not present

## 2022-10-29 DIAGNOSIS — Z83438 Family history of other disorder of lipoprotein metabolism and other lipidemia: Secondary | ICD-10-CM | POA: Diagnosis not present

## 2022-10-29 DIAGNOSIS — E8729 Other acidosis: Secondary | ICD-10-CM | POA: Diagnosis not present

## 2022-10-29 DIAGNOSIS — I25118 Atherosclerotic heart disease of native coronary artery with other forms of angina pectoris: Secondary | ICD-10-CM | POA: Diagnosis not present

## 2022-10-29 DIAGNOSIS — J440 Chronic obstructive pulmonary disease with acute lower respiratory infection: Secondary | ICD-10-CM | POA: Diagnosis not present

## 2022-10-29 DIAGNOSIS — I10 Essential (primary) hypertension: Secondary | ICD-10-CM | POA: Diagnosis not present

## 2022-10-29 DIAGNOSIS — J189 Pneumonia, unspecified organism: Secondary | ICD-10-CM | POA: Diagnosis not present

## 2022-10-29 DIAGNOSIS — J439 Emphysema, unspecified: Secondary | ICD-10-CM | POA: Diagnosis not present

## 2022-10-29 DIAGNOSIS — J431 Panlobular emphysema: Secondary | ICD-10-CM | POA: Diagnosis not present

## 2022-10-29 DIAGNOSIS — J9690 Respiratory failure, unspecified, unspecified whether with hypoxia or hypercapnia: Secondary | ICD-10-CM | POA: Diagnosis not present

## 2022-10-29 DIAGNOSIS — E870 Hyperosmolality and hypernatremia: Secondary | ICD-10-CM | POA: Diagnosis not present

## 2022-10-29 DIAGNOSIS — R0602 Shortness of breath: Secondary | ICD-10-CM | POA: Diagnosis not present

## 2022-10-29 DIAGNOSIS — J9601 Acute respiratory failure with hypoxia: Secondary | ICD-10-CM | POA: Diagnosis not present

## 2022-10-29 DIAGNOSIS — J9811 Atelectasis: Secondary | ICD-10-CM | POA: Diagnosis not present

## 2022-10-29 DIAGNOSIS — J9621 Acute and chronic respiratory failure with hypoxia: Secondary | ICD-10-CM | POA: Diagnosis not present

## 2022-10-29 DIAGNOSIS — G928 Other toxic encephalopathy: Secondary | ICD-10-CM | POA: Diagnosis not present

## 2022-10-29 DIAGNOSIS — Z8249 Family history of ischemic heart disease and other diseases of the circulatory system: Secondary | ICD-10-CM | POA: Diagnosis not present

## 2022-10-29 DIAGNOSIS — R0902 Hypoxemia: Secondary | ICD-10-CM | POA: Diagnosis not present

## 2022-10-29 DIAGNOSIS — Z79899 Other long term (current) drug therapy: Secondary | ICD-10-CM | POA: Diagnosis not present

## 2022-10-29 DIAGNOSIS — I959 Hypotension, unspecified: Secondary | ICD-10-CM | POA: Diagnosis not present

## 2022-10-29 DIAGNOSIS — E7849 Other hyperlipidemia: Secondary | ICD-10-CM | POA: Diagnosis not present

## 2022-10-29 DIAGNOSIS — I6381 Other cerebral infarction due to occlusion or stenosis of small artery: Secondary | ICD-10-CM | POA: Diagnosis not present

## 2022-10-29 DIAGNOSIS — T380X5A Adverse effect of glucocorticoids and synthetic analogues, initial encounter: Secondary | ICD-10-CM | POA: Diagnosis not present

## 2022-10-29 DIAGNOSIS — Z974 Presence of external hearing-aid: Secondary | ICD-10-CM | POA: Diagnosis not present

## 2022-10-29 DIAGNOSIS — F419 Anxiety disorder, unspecified: Secondary | ICD-10-CM | POA: Diagnosis not present

## 2022-10-29 DIAGNOSIS — Z1152 Encounter for screening for COVID-19: Secondary | ICD-10-CM | POA: Diagnosis not present

## 2022-10-29 DIAGNOSIS — R410 Disorientation, unspecified: Secondary | ICD-10-CM | POA: Diagnosis not present

## 2022-10-29 DIAGNOSIS — J9602 Acute respiratory failure with hypercapnia: Secondary | ICD-10-CM | POA: Diagnosis not present

## 2022-10-29 DIAGNOSIS — Z91199 Patient's noncompliance with other medical treatment and regimen due to unspecified reason: Secondary | ICD-10-CM | POA: Diagnosis not present

## 2022-10-29 DIAGNOSIS — I9589 Other hypotension: Secondary | ICD-10-CM | POA: Diagnosis not present

## 2022-10-29 DIAGNOSIS — Y92239 Unspecified place in hospital as the place of occurrence of the external cause: Secondary | ICD-10-CM | POA: Diagnosis not present

## 2022-10-29 DIAGNOSIS — J9622 Acute and chronic respiratory failure with hypercapnia: Secondary | ICD-10-CM | POA: Diagnosis not present

## 2022-10-29 DIAGNOSIS — W44F3XA Food entering into or through a natural orifice, initial encounter: Secondary | ICD-10-CM | POA: Diagnosis not present

## 2022-10-29 LAB — COMPREHENSIVE METABOLIC PANEL
ALT: 28 U/L (ref 0–44)
AST: 49 U/L — ABNORMAL HIGH (ref 15–41)
Albumin: 3.2 g/dL — ABNORMAL LOW (ref 3.5–5.0)
Alkaline Phosphatase: 94 U/L (ref 38–126)
Anion gap: 10 (ref 5–15)
BUN: 27 mg/dL — ABNORMAL HIGH (ref 8–23)
CO2: 30 mmol/L (ref 22–32)
Calcium: 9.1 mg/dL (ref 8.9–10.3)
Chloride: 99 mmol/L (ref 98–111)
Creatinine, Ser: 0.88 mg/dL (ref 0.61–1.24)
GFR, Estimated: >60 mL/min (ref >60)
Glucose, Bld: 201 mg/dL — ABNORMAL HIGH (ref 70–99)
Potassium: 4.7 mmol/L (ref 3.5–5.1)
Sodium: 139 mmol/L (ref 135–145)
Total Bilirubin: 0.7 mg/dL (ref 0.3–1.2)
Total Protein: 7.8 g/dL (ref 6.5–8.1)

## 2022-10-29 LAB — CBC
HCT: 43.4 % (ref 39.0–52.0)
Hemoglobin: 14 g/dL (ref 13.0–17.0)
MCH: 30.8 pg (ref 26.0–34.0)
MCHC: 32.3 g/dL (ref 30.0–36.0)
MCV: 95.6 fL (ref 80.0–100.0)
Platelets: 313 10*3/uL (ref 150–400)
RBC: 4.54 MIL/uL (ref 4.22–5.81)
RDW: 13.1 % (ref 11.5–15.5)
WBC: 10 10*3/uL (ref 4.0–10.5)
nRBC: 0 % (ref 0.0–0.2)

## 2022-10-29 MED ORDER — UMECLIDINIUM BROMIDE 62.5 MCG/ACT IN AEPB
1.0000 | INHALATION_SPRAY | Freq: Every day | RESPIRATORY_TRACT | Status: DC
  Administered 2022-10-29 – 2022-10-30 (×2): 1 via RESPIRATORY_TRACT
  Filled 2022-10-29: qty 7

## 2022-10-29 MED ORDER — FOLIC ACID 1 MG PO TABS
1.0000 mg | ORAL_TABLET | Freq: Every day | ORAL | Status: DC
  Administered 2022-10-29 – 2022-11-06 (×6): 1 mg via ORAL
  Filled 2022-10-29 (×7): qty 1

## 2022-10-29 MED ORDER — FLUTICASONE FUROATE-VILANTEROL 100-25 MCG/ACT IN AEPB
1.0000 | INHALATION_SPRAY | Freq: Every day | RESPIRATORY_TRACT | Status: DC
  Administered 2022-10-29 – 2022-10-30 (×2): 1 via RESPIRATORY_TRACT
  Filled 2022-10-29: qty 28

## 2022-10-29 MED ORDER — IPRATROPIUM-ALBUTEROL 0.5-2.5 (3) MG/3ML IN SOLN
3.0000 mL | Freq: Three times a day (TID) | RESPIRATORY_TRACT | Status: DC
  Administered 2022-10-29 – 2022-10-30 (×3): 3 mL via RESPIRATORY_TRACT
  Filled 2022-10-29 (×3): qty 3

## 2022-10-29 MED ORDER — FLUTICASONE-UMECLIDIN-VILANT 100-62.5-25 MCG/INH IN AEPB: 1.0000 | INHALATION_SPRAY | Freq: Every day | RESPIRATORY_TRACT | Status: DC

## 2022-10-29 MED ORDER — ENOXAPARIN SODIUM 40 MG/0.4ML IJ SOSY
40.0000 mg | PREFILLED_SYRINGE | INTRAMUSCULAR | Status: DC
  Administered 2022-10-29 – 2022-11-12 (×15): 40 mg via SUBCUTANEOUS
  Filled 2022-10-29 (×15): qty 0.4

## 2022-10-29 NOTE — Assessment & Plan Note (Addendum)
Most likely secondary to atypical pneumonia.  Patient also received Solu-Medrol. Improving.  No wheezing today -Switching Solu-Medrol with prednisone 20 mg daily for the next couple of days -Continue with bronchodilators

## 2022-10-29 NOTE — Hospital Course (Addendum)
ICU transfer.  82 y.o. male with medical history significant of chronic respiratory failure on 2 L, COPD, hypertension, hyperlipidemia presenting with acute on chronic respiratory failure with hypoxia and COPD exacerbation.  Patient reports increased work of breathing over the past 3 to 4 days.  Positive wheezing and increased work of breathing.  No reported sick contacts.  Wife reports that her and the patient went on vacation to the beach.  Patient was noncompliant with wearing his oxygen while on vacation.  No fevers or chills.  Quit smoking 11 years ago.  No chest pain.  No nausea or vomiting.  Has had increased O2 requirements as well as increased inhaler use at home.  Wife reports a significant event of COPD exacerbation almost 10 years ago that required and patient being transferred to [facility] because of declining respiratory status.  Presented to the ER afebrile, hemodynamically stable.  Requiring 2 to 3 L to keep O2 sats greater than 92%.  White count 12.5, hemoglobin 14.7, platelets 311.  Creatinine 0.97.  Glucose 191.  Troponin negative.,  COVID flu and RSV negative. RVP negative.  3/19.  Patient down to his baseline 2 L of oxygen.  With nursing staff it took 2 people to move them around.  Patient sleeping in the afternoon on reevaluation.  As per patient's wife he has not been wearing his oxygen as he normally does on vacation and has a very poor diet over the last 4 days. 3/21: More somnolent due to CO2 Narcosis. Transfer to ICU for BiPAP, PCCM consulted.  CT chest concerning for atypical/viral pneumonia, adding on Rocephin. 3/22: Mentation improved initially, initially tolerating trial off BiPAP.  Later developed severe agitation and delirium requiring Precedex. 3/23: off bipap and oriented this morning. Short of breath during the day, back on BiPAP.  3/24: Patient remained little tachypneic, saturating well on 2 L of oxygen.  Labs with slight worsening of leukocytosis at 12.5, rest stable.   Weaned off from Precedex.  PT was recommending SNF but wife would like to take him back home with home health services.  Patient will also need BiPAP to use at night and while taking naps during the day.  3/25: Patient stable this morning.  Unable to do overnight pulse ox as he desaturated and was placed on BiPAP yesterday evening.  Most likely candidal today.  Wife now agrees for CIR-pending evaluation and insurance authorization.  Repeat chest x-ray today with severe emphysematous changes and no evidence of lobar consolidation.  Pt has Chronic respiratory Failure second to COPD. Needs non-invasive ventilator to reduce hospitalizations and sustain life.   3/26: Hemodynamically stable.  NIV ordered.  Pending CIR evaluation

## 2022-10-29 NOTE — Assessment & Plan Note (Addendum)
Back down to his baseline 2 L of oxygen.  Required BiPAP initially. -Continue with supplemental oxygen -Continue with supportive care

## 2022-10-29 NOTE — Evaluation (Signed)
Physical Therapy Evaluation Patient Details Name: Gregory Haynes MRN: [id number] DOB: May 07, 1941 Today's Date: 10/29/2022  History of Present Illness  Gregory Haynes is a 82 y.o. male with medical history significant of chronic respiratory failure on 2 L, COPD, hypertension, hyperlipidemia presenting with acute on chronic respiratory failure with hypoxia and COPD exacerbation.   Clinical Impression  Pt admitted with above diagnosis. Pt received upright in be with spouse at bedside. Pt sleeping but awakens to voice and is agreeable to PT. Pt resting on his baseline  2 L/min but satting at rest at 88-89%. Requires education on PLB with good carryover leading to 90-91% prior to mobility. Pt mod-I with bed mobility to sitting EOB just requiring increased time.  Pt is able to stand at supervision level for ~ 1 minute. Some mild AP/PA sway in standing but able to correct himself. Pt decline gait attempts stating he needs to sit due to dizziness. PT attempting to assist pt to recliner but pt reporting need to return to supine at mod-I level. Educated pt and spouse on benefits of sitting posture and gentle OOB mobility for pulmonary toileting and prevent deconditioning. Pt's spouse with some concerns managing pt at home in his current state. PT and spouse in agreement pt would currently benefit from additional PT as pt far below current baseline. Pt positioned with all needs in reach. Encouraged to sit in recliner later today with assistance from nursing. Pt currently with functional limitations due to the deficits listed below (see PT Problem List). Pt will benefit from skilled PT to increase their independence and safety with mobility to allow discharge to the venue listed below.    Vitals in supine after standing effort: 133/73 mm Hg, HR: 75 BPM   Recommendations for follow up therapy are one component of a multi-disciplinary discharge planning process, led by the attending physician.  Recommendations may  be updated based on patient status, additional functional criteria and insurance authorization.  Follow Up Recommendations Skilled nursing-short term rehab (<3 hours/day) Can patient physically be transported by private vehicle: No    Assistance Recommended at Discharge Intermittent Supervision/Assistance  Patient can return home with the following  A little help with walking and/or transfers;A little help with bathing/dressing/bathroom;Assistance with cooking/housework;Assist for transportation;Help with stairs or ramp for entrance    Equipment Recommendations Other (comment) (TBD by next venue of care)  Recommendations for Other Services       Functional Status Assessment Patient has had a recent decline in their functional status and demonstrates the ability to make significant improvements in function in a reasonable and predictable amount of time.     Precautions / Restrictions Precautions Precautions: Fall Restrictions Weight Bearing Restrictions: No      Mobility  Bed Mobility Overal bed mobility: Modified Independent               Patient Response: Cooperative  Transfers Overall transfer level: Needs assistance Equipment used: None Transfers: Sit to/from Stand Sit to Stand: Supervision                Ambulation/Gait               General Gait Details: Pt deferring due to dizziness  Stairs            Wheelchair Mobility    Modified Rankin (Stroke Patients Only)       Balance Overall balance assessment: Needs assistance Sitting-balance support: Bilateral upper extremity supported, Feet supported Sitting balance-Leahy Scale: Fair  Standing balance-Leahy Scale: Fair Standing balance comment: mildly unsteady, no AD needed                             Pertinent Vitals/Pain Pain Assessment Pain Assessment: No/denies pain    Home Living Family/patient expects to be discharged to:: Private residence Living  Arrangements: Spouse/significant other Available Help at Discharge: Family;Available 24 hours/day Type of Home: House Home Access: Stairs to enter Entrance Stairs-Rails: Left Entrance Stairs-Number of Steps: 4   Home Layout: One level Home Equipment: Rollator (4 wheels) (home O2)      Prior Function Prior Level of Function : Independent/Modified Independent             Mobility Comments: sometimes uses rollator for transporting his home O2       Hand Dominance        Extremity/Trunk Assessment   Upper Extremity Assessment Upper Extremity Assessment: Overall WFL for tasks assessed    Lower Extremity Assessment Lower Extremity Assessment: Generalized weakness    Cervical / Trunk Assessment Cervical / Trunk Assessment: Normal  Communication   Communication: No difficulties  Cognition Arousal/Alertness: Awake/alert Behavior During Therapy: WFL for tasks assessed/performed Overall Cognitive Status: Within Functional Limits for tasks assessed                                 General Comments: Appears very fatigued, pleasant and attempts best efforts with mobility        General Comments      Exercises Other Exercises Other Exercises: Role of PT in acute setting, d/c recs, sitting posture for pulmonary hygiene.   Assessment/Plan    PT Assessment Patient needs continued PT services  PT Problem List Decreased strength;Cardiopulmonary status limiting activity;Decreased activity tolerance;Decreased balance;Decreased mobility       PT Treatment Interventions DME instruction;Therapeutic exercise;Gait training;Balance training;Stair training;Neuromuscular re-education;Functional mobility training;Therapeutic activities;Patient/family education    PT Goals (Current goals can be found in the Care Plan section)  Acute Rehab PT Goals Patient Stated Goal: Improve breathing PT Goal Formulation: With patient Time For Goal Achievement: 11/12/22 Potential  to Achieve Goals: Good    Frequency Min 3X/week     Co-evaluation               AM-PAC PT "6 Clicks" Mobility  Outcome Measure Help needed turning from your back to your side while in a flat bed without using bedrails?: None Help needed moving from lying on your back to sitting on the side of a flat bed without using bedrails?: A Little Help needed moving to and from a bed to a chair (including a wheelchair)?: A Little Help needed standing up from a chair using your arms (e.g., wheelchair or bedside chair)?: A Little Help needed to walk in hospital room?: Total Help needed climbing 3-5 steps with a railing? : Total 6 Click Score: 15    End of Session Equipment Utilized During Treatment: Gait belt;Oxygen Activity Tolerance: Patient limited by fatigue;Other (comment) (dizziness) Patient left: in bed;with family/visitor present;with call bell/phone within reach;with bed alarm set Nurse Communication: Mobility status PT Visit Diagnosis: Unsteadiness on feet (R26.81);Other abnormalities of gait and mobility (R26.89);Muscle weakness (generalized) (M62.81)    Time: [id number] PT Time Calculation (min) (ACUTE ONLY): 24 min   Charges:   PT Evaluation $PT Eval Moderate Complexity: 1 Mod PT Treatments $Therapeutic Activity: 8-22 mins  Salem Caster. Fairly IV, PT, DPT Physical Therapist- [facility]  10/29/2022, 2:43 PM

## 2022-10-29 NOTE — Assessment & Plan Note (Signed)
-  Continue Lipitor °

## 2022-10-29 NOTE — Progress Notes (Signed)
Progress Note   Patient: Gregory Haynes [id number] DOB: [birth year] DOA: 10/28/2022     0 DOS: the patient was seen and examined on 10/29/2022   Brief hospital course: 82 y.o. male with medical history significant of chronic respiratory failure on 2 L, COPD, hypertension, hyperlipidemia presenting with acute on chronic respiratory failure with hypoxia and COPD exacerbation.  Patient reports increased work of breathing over the past 3 to 4 days.  Wife at at the bedside reporting similar.  Positive wheezing and increased work of breathing.  No reported sick contacts.  Wife reports that her and the patient went on vacation to the beach.  Patient was noncompliant with wearing his oxygen while on vacation.  No fevers or chills.  Quit smoking 11 years ago.  No chest pain.  No nausea or vomiting.  Has had increased O2 requirements as well as increased inhaler use at home.  Wife reports a significant event of COPD exacerbation almost 10 years ago that required and patient being transferred to [facility] because of declining respiratory status.  Declining respiratory status. Presented to the ER afebrile, hemodynamically stable.  Requiring 2 to 3 L to keep O2 sats greater than 92%.  White count 12.5, hemoglobin 14.7, platelets 311.  Creatinine 0.97.  Glucose 191.  Troponin negative.,  COVID flu and RSV negative.  3/19.  Patient down to his baseline 2 L of oxygen.  With nursing staff it took 2 people to move them around.  Patient sleeping in the afternoon on reevaluation.  As per patient's wife he has not been wearing his oxygen as he normally does on vacation and has a very poor diet over the last 4 days.  Assessment and Plan: * Acute on chronic respiratory failure with hypoxia (HCC) Back down to his baseline 2 L of oxygen.  Held his sats with walking around but took 2 people to move arm around.  Received Solu-Medrol yesterday.  On prednisone today.  Continue nebulizer treatments.  Continue inhalers.  COPD  exacerbation (HCC) Received Solu-Medrol yesterday on prednisone today.  Continue nebulizers and inhalers.  Generalized weakness Took to nursing staff to walk him around.  Will get physical therapy evaluation.  Memory loss Follow-up as outpatient.  Coronary artery disease of native artery of native heart with stable angina pectoris (HCC) Continue Coreg and atorvastatin  HTN, goal below 130/80 Continue Coreg and lisinopril   Other hyperlipidemia Continue Lipitor         Subjective: Patient stated that he did not know where he was but now he knows he is in the hospital.  Had some shortness of breath prior to coming in.  As per wife was not wearing his oxygen and eating properly while on vacation.  Admitted with COPD exacerbation.  Physical Exam: Vitals:   10/28/22 2342 10/29/22 0048 10/29/22 0156 10/29/22 0827  BP:  (!) 156/74 (!) 172/86 (!) 141/83  Pulse:  (!) 105 99 93  Resp:  (!) 21 20 18   Temp: 98 F (36.7 C)  98 F (36.7 C) 98 F (36.7 C)  TempSrc: Oral  Oral Oral  SpO2:  94% 99% 94%  Weight:   86.3 kg   Height:   5\' 11"  (1.803 m)    Physical Exam HENT:     Head: Normocephalic.     Mouth/Throat:     Pharynx: No oropharyngeal exudate.  Eyes:     General: Lids are normal.     Conjunctiva/sclera: Conjunctivae normal.  Cardiovascular:     Rate  and Rhythm: Normal rate and regular rhythm.     Heart sounds: Normal heart sounds, S1 normal and S2 normal.  Pulmonary:     Breath sounds: Examination of the right-lower field reveals decreased breath sounds and wheezing. Examination of the left-lower field reveals decreased breath sounds. Decreased breath sounds and wheezing present. No rhonchi or rales.  Musculoskeletal:     Right lower leg: No swelling.     Left lower leg: No swelling.  Skin:    General: Skin is warm.     Findings: No rash.  Neurological:     Mental Status: He is alert.     Data Reviewed: Creatinine 0.88, white blood cell count 10, hemoglobin  14.0  Family Communication: Spoke with wife at the bedside twice today  Disposition: Status is: Observation Not sending home today very weak with physical therapy.  Planned Discharge Destination: To be determined based on physical therapy evaluation    Time spent: 28 minutes  Author: Loletha Grayer, MD 10/29/2022 1:35 PM  For on call review [email or web address].

## 2022-10-29 NOTE — Assessment & Plan Note (Signed)
Continue Coreg and lisinopril

## 2022-10-29 NOTE — Assessment & Plan Note (Signed)
Follow up as outpatient.  

## 2022-10-29 NOTE — Assessment & Plan Note (Signed)
Continue Coreg and atorvastatin

## 2022-10-29 NOTE — Assessment & Plan Note (Addendum)
PT is recommending SNF.  Wife would like to take him back home with home health services which were ordered.

## 2022-10-30 DIAGNOSIS — J441 Chronic obstructive pulmonary disease with (acute) exacerbation: Secondary | ICD-10-CM | POA: Diagnosis not present

## 2022-10-30 DIAGNOSIS — J9621 Acute and chronic respiratory failure with hypoxia: Secondary | ICD-10-CM | POA: Diagnosis not present

## 2022-10-30 DIAGNOSIS — R531 Weakness: Secondary | ICD-10-CM | POA: Diagnosis not present

## 2022-10-30 LAB — TSH: TSH: 0.141 u[IU]/mL — ABNORMAL LOW (ref 0.350–4.500)

## 2022-10-30 LAB — VITAMIN B12: Vitamin B-12: 325 pg/mL (ref 180–914)

## 2022-10-30 MED ORDER — IPRATROPIUM-ALBUTEROL 0.5-2.5 (3) MG/3ML IN SOLN
3.0000 mL | Freq: Two times a day (BID) | RESPIRATORY_TRACT | Status: DC
  Administered 2022-10-30 – 2022-10-31 (×2): 3 mL via RESPIRATORY_TRACT
  Filled 2022-10-30 (×2): qty 3

## 2022-10-30 NOTE — Progress Notes (Signed)
PROGRESS NOTE    Gregory Haynes  [id number] DOB: 02-Mar-1941 DOA: 10/28/2022 PCP: Eulas Post, MD   Assessment & Plan:   Principal Problem:   Acute on chronic respiratory failure with hypoxia ([place]) Active Problems:   COPD exacerbation ([place])   Generalized weakness   Other hyperlipidemia   HTN, goal below 130/80   Coronary artery disease of native artery of native heart with stable angina pectoris ([place])   Memory loss  Assessment and Plan: Acute on chronic hypoxic respiratory failure: likely secondary to COPD exacerbation. Continue on steroids, bronchodilators & encourage incentive spirometry. Uses oxygen at home prn but likely needs oxygen 24-7   COPD exacerbation: continue on azithromycin, steroids, bronchodilators & encourage incentive spirometry. Continue on supplemental oxygen    Generalized weakness: PT recs SNF   Memory loss: vs delirium secondary to steroid use vs hospital delirium vs hypoxia. Continue w/ supportive care.    Hx of CAD: continue on coreg, lisinopril, statin    HTN: continue on coreg, lisinopril    HLD: continue on statin       DVT prophylaxis: lovenox  Code Status: full  Family Communication: discussed pt's care w/ pt's wife at bedside, [place], and answered her questions  Disposition Plan: likely will d/c to SNF   Level of care: Med-Surg  Status is: Inpatient Remains inpatient appropriate because: severity of illness    Consultants:    Procedures:   Antimicrobials: azithromycin    Subjective: Pt c/o fatigue   Objective: Vitals:   10/29/22 2019 10/29/22 2058 10/30/22 0250 10/30/22 0800  BP: (!) 141/115  132/81 134/62  Pulse: 86  91 82  Resp: 16  20 16   Temp: 98 F (36.7 C)  97.9 F (36.6 C) 98.3 F (36.8 C)  TempSrc: Oral   Oral  SpO2: 97% 97% 96% 100%  Weight:      Height:        Intake/Output Summary (Last 24 hours) at 10/30/2022 0841 Last data filed at 10/29/2022 1926 Gross per 24 hour  Intake 740 ml   Output --  Net 740 ml   Filed Weights   10/29/22 0156  Weight: 86.3 kg    Examination:  General exam: Appears calm and comfortable  Respiratory system: diminished breath sounds b/l Cardiovascular system: S1 & S2. No rubs, gallops or clicks. Gastrointestinal system: Abdomen is nondistended, soft and nontender. Normal bowel sounds heard. Central nervous system: Alert and awake. Moves all extremities  Psychiatry: Judgement and insight appears poor. Flat mood and affect     Data Reviewed: I have personally reviewed following labs and imaging studies  CBC: Recent Labs  Lab 10/28/22 1225 10/29/22 0649  WBC 12.5* 10.0  HGB 14.7 14.0  HCT 46.0 43.4  MCV 95.2 95.6  PLT 311 [id number]   Basic Metabolic Panel: Recent Labs  Lab 10/28/22 1225 10/29/22 0649  NA 135 139  K 4.2 4.7  CL 93* 99  CO2 31 30  GLUCOSE 191* 201*  BUN 22 27*  CREATININE 0.97 0.88  CALCIUM 9.3 9.1   GFR: Estimated Creatinine Clearance: 71.3 mL/min (by C-G formula based on SCr of 0.88 mg/dL). Liver Function Tests: Recent Labs  Lab 10/29/22 0649  AST 49*  ALT 28  ALKPHOS 94  BILITOT 0.7  PROT 7.8  ALBUMIN 3.2*   No results for input(s): "LIPASE", "AMYLASE" in the last 168 hours. No results for input(s): "AMMONIA" in the last 168 hours. Coagulation Profile: No results for input(s): "INR", "PROTIME" in the last  168 hours. Cardiac Enzymes: No results for input(s): "CKTOTAL", "CKMB", "CKMBINDEX", "TROPONINI" in the last 168 hours. BNP (last 3 results) No results for input(s): "PROBNP" in the last 8760 hours. HbA1C: No results for input(s): "HGBA1C" in the last 72 hours. CBG: No results for input(s): "GLUCAP" in the last 168 hours. Lipid Profile: No results for input(s): "CHOL", "HDL", "LDLCALC", "TRIG", "CHOLHDL", "LDLDIRECT" in the last 72 hours. Thyroid Function Tests: Recent Labs    10/30/22 0717  TSH 0.141*   Anemia Panel: No results for input(s): "VITAMINB12", "FOLATE", "FERRITIN",  "TIBC", "IRON", "RETICCTPCT" in the last 72 hours. Sepsis Labs: No results for input(s): "PROCALCITON", "LATICACIDVEN" in the last 168 hours.  Recent Results (from the past 240 hour(s))  Resp panel by RT-PCR (RSV, Flu A&B, Covid) Anterior Nasal Swab     Status: None   Collection Time: 10/28/22 12:26 PM   Specimen: Anterior Nasal Swab  Result Value Ref Range Status   SARS Coronavirus 2 by RT PCR NEGATIVE NEGATIVE Final    Comment: (NOTE) SARS-CoV-2 target nucleic acids are NOT DETECTED.  The SARS-CoV-2 RNA is generally detectable in upper respiratory specimens during the acute phase of infection. The lowest concentration of SARS-CoV-2 viral copies this assay can detect is 138 copies/mL. A negative result does not preclude SARS-Cov-2 infection and should not be used as the sole basis for treatment or other patient management decisions. A negative result may occur with  improper specimen collection/handling, submission of specimen other than nasopharyngeal swab, presence of viral mutation(s) within the areas targeted by this assay, and inadequate number of viral copies(<138 copies/mL). A negative result must be combined with clinical observations, patient history, and epidemiological information. The expected result is Negative.  Fact Sheet for Patients:  EntrepreneurPulse.com.au  Fact Sheet for Healthcare Providers:  IncredibleEmployment.be  This test is no t yet approved or cleared by the [place] FDA and  has been authorized for detection and/or diagnosis of SARS-CoV-2 by FDA under an Emergency Use Authorization (EUA). This EUA will remain  in effect (meaning this test can be used) for the duration of the COVID-19 declaration under Section 564(b)(1) of the Act, 21 U.S.C.section 360bbb-3(b)(1), unless the authorization is terminated  or revoked sooner.       Influenza A by PCR NEGATIVE NEGATIVE Final   Influenza B by PCR NEGATIVE  NEGATIVE Final    Comment: (NOTE) The Xpert Xpress SARS-CoV-2/FLU/RSV plus assay is intended as an aid in the diagnosis of influenza from Nasopharyngeal swab specimens and should not be used as a sole basis for treatment. Nasal washings and aspirates are unacceptable for Xpert Xpress SARS-CoV-2/FLU/RSV testing.  Fact Sheet for Patients: EntrepreneurPulse.com.au  Fact Sheet for Healthcare Providers: IncredibleEmployment.be  This test is not yet approved or cleared by the [place] FDA and has been authorized for detection and/or diagnosis of SARS-CoV-2 by FDA under an Emergency Use Authorization (EUA). This EUA will remain in effect (meaning this test can be used) for the duration of the COVID-19 declaration under Section 564(b)(1) of the Act, 21 U.S.C. section 360bbb-3(b)(1), unless the authorization is terminated or revoked.     Resp Syncytial Virus by PCR NEGATIVE NEGATIVE Final    Comment: (NOTE) Fact Sheet for Patients: EntrepreneurPulse.com.au  Fact Sheet for Healthcare Providers: IncredibleEmployment.be  This test is not yet approved or cleared by the [place] FDA and has been authorized for detection and/or diagnosis of SARS-CoV-2 by FDA under an Emergency Use Authorization (EUA). This EUA will remain in effect (meaning this  test can be used) for the duration of the COVID-19 declaration under Section 564(b)(1) of the Act, 21 U.S.C. section 360bbb-3(b)(1), unless the authorization is terminated or revoked.  Performed at [facility], [address] [place], [place] [address]          Radiology Studies: DG Chest 2 View  Result Date: 10/28/2022 CLINICAL DATA:  Shortness of breath EXAM: CHEST - 2 VIEW COMPARISON:  03/07/2012 FINDINGS: The heart size and mediastinal contours are within normal limits. Aortic atherosclerosis. Hyperinflated lungs with coarsened interstitial  markings bilaterally. No focal airspace consolidation. No pleural effusion or pneumothorax. The visualized skeletal structures are unremarkable. IMPRESSION: Findings of COPD. No focal airspace consolidation. Electronically Signed   By: Davina Poke D.O.   On: 10/28/2022 13:11        Scheduled Meds:  atorvastatin  40 mg Oral QPM   azithromycin  500 mg Oral Daily   carvedilol  12.5 mg Oral BID WC   enoxaparin (LOVENOX) injection  40 mg Subcutaneous Q24H   fluticasone furoate-vilanterol  1 puff Inhalation Daily   And   umeclidinium bromide  1 puff Inhalation Daily   folic acid  1 mg Oral Daily   ipratropium-albuterol  3 mL Nebulization TID   lisinopril  10 mg Oral Daily   predniSONE  40 mg Oral Q breakfast   Continuous Infusions:   LOS: 1 day    Time spent: 35 mins    Wyvonnia Dusky, MD [facility] Pager [phone]  If 7PM-7AM, please contact night-coverage [email or web address] 10/30/2022, 8:41 AM

## 2022-10-30 NOTE — Progress Notes (Signed)
Physical Therapy Treatment Patient Details Name: Gregory Haynes MRN: [id number] DOB: May 28, 1941 Today's Date: 10/30/2022  History of Present Illness Gregory Haynes is a 82 y.o. male with medical history significant of chronic respiratory failure on 2 L, COPD, hypertension, hyperlipidemia presenting with acute on chronic respiratory failure with hypoxia and COPD exacerbation.    PT Comments    Pt received supine in bed with spouse present. Per spouse pt remains very sleepy and lethargic. Pt without hearing aids but easily awakens to voice and light touch. Unable to get hearing aids to work during session thus pt does rely on max multimodal cuing and hand over hand direction for carryover. Pt is able to communicate better without SOB this date but does frequently need tactile stimulation to arms or legs to keeps eyes open to participate. Pt is more reliant on physical assist today compared to yesterday with bed mobility and STS. Attempted to run orthostatics as pt had positional dizziness but unable to get standing BP. Supine and seated listed below with pt declining dizziness today. Pt able to SPT with RW to recliner at supervision level with improved stability with RW compared to standing attempts yesterday without AD. Pt situated in recliner and is quick to return to sleep. SPO2 is improved with mobility today at his baseline 3 L/min during OOB mobility but still unable to progress gait today due to lethargy. Continue to rec STR at discharge as pt far below baseline level of mobility.     Recommendations for follow up therapy are one component of a multi-disciplinary discharge planning process, led by the attending physician.  Recommendations may be updated based on patient status, additional functional criteria and insurance authorization.  Follow Up Recommendations  Skilled nursing-short term rehab (<3 hours/day) Can patient physically be transported by private vehicle: No   Assistance  Recommended at Discharge Intermittent Supervision/Assistance  Patient can return home with the following A little help with walking and/or transfers;A little help with bathing/dressing/bathroom;Assistance with cooking/housework;Assist for transportation;Help with stairs or ramp for entrance   Equipment Recommendations  Other (comment) (TBD by next venue of care)    Recommendations for Other Services       Precautions / Restrictions Precautions Precautions: Fall Restrictions Weight Bearing Restrictions: No     Mobility  Bed Mobility Overal bed mobility: Needs Assistance Bed Mobility: Supine to Sit     Supine to sit: Min assist, HOB elevated       Patient Response: Cooperative  Transfers Overall transfer level: Needs assistance Equipment used: Rolling walker (2 wheels) Transfers: Sit to/from Stand, Bed to chair/wheelchair/BSC Sit to Stand: Min assist   Step pivot transfers: Supervision       General transfer comment: hand over hand cuing for safe hand placement.    Ambulation/Gait               General Gait Details: deferred due to lethargy, unable to hear well to follow commands due to hearing aid difficulty.   Stairs             Wheelchair Mobility    Modified Rankin (Stroke Patients Only)       Balance Overall balance assessment: Needs assistance   Sitting balance-Leahy Scale: Fair       Standing balance-Leahy Scale: Fair Standing balance comment: light RW support                            Cognition Arousal/Alertness: Lethargic Behavior  During Therapy: WFL for tasks assessed/performed Overall Cognitive Status: Within Functional Limits for tasks assessed                                 General Comments: Difficulty keeping eyes open this date due to lethargy. Hearing aids not working appropriately so difficult for pt to follow PT direction without max multimodal cuing        Exercises      General  Comments General comments (skin integrity, edema, etc.): Maintaining >/= 90% with 3 L/min      Pertinent Vitals/Pain Pain Assessment Pain Assessment: No/denies pain    Home Living                          Prior Function            PT Goals (current goals can now be found in the care plan section) Acute Rehab PT Goals Patient Stated Goal: Improve breathing PT Goal Formulation: With patient Time For Goal Achievement: 11/12/22 Potential to Achieve Goals: Good Progress towards PT goals: Progressing toward goals    Frequency    Min 3X/week      PT Plan Current plan remains appropriate    Co-evaluation              AM-PAC PT "6 Clicks" Mobility   Outcome Measure  Help needed turning from your back to your side while in a flat bed without using bedrails?: A Little Help needed moving from lying on your back to sitting on the side of a flat bed without using bedrails?: A Little Help needed moving to and from a bed to a chair (including a wheelchair)?: A Little Help needed standing up from a chair using your arms (e.g., wheelchair or bedside chair)?: A Little Help needed to walk in hospital room?: Total Help needed climbing 3-5 steps with a railing? : Total 6 Click Score: 14    End of Session Equipment Utilized During Treatment: Gait belt;Oxygen Activity Tolerance: Patient limited by lethargy Patient left: in chair;with call bell/phone within reach;with chair alarm set;with family/visitor present;with nursing/sitter in room Nurse Communication: Mobility status PT Visit Diagnosis: Unsteadiness on feet (R26.81);Other abnormalities of gait and mobility (R26.89);Muscle weakness (generalized) (M62.81)     Time: [id number] PT Time Calculation (min) (ACUTE ONLY): 29 min  Charges:  $Therapeutic Activity: 23-37 mins                     Samay Delcarlo M. Fairly IV, PT, DPT Physical Therapist- [facility]  10/30/2022, 11:39 AM

## 2022-10-31 ENCOUNTER — Inpatient Hospital Stay: Payer: Medicare HMO

## 2022-10-31 DIAGNOSIS — G928 Other toxic encephalopathy: Secondary | ICD-10-CM | POA: Diagnosis not present

## 2022-10-31 DIAGNOSIS — J9602 Acute respiratory failure with hypercapnia: Secondary | ICD-10-CM

## 2022-10-31 DIAGNOSIS — J441 Chronic obstructive pulmonary disease with (acute) exacerbation: Secondary | ICD-10-CM | POA: Diagnosis not present

## 2022-10-31 DIAGNOSIS — J189 Pneumonia, unspecified organism: Secondary | ICD-10-CM | POA: Diagnosis not present

## 2022-10-31 DIAGNOSIS — J9601 Acute respiratory failure with hypoxia: Secondary | ICD-10-CM

## 2022-10-31 DIAGNOSIS — R0689 Other abnormalities of breathing: Secondary | ICD-10-CM

## 2022-10-31 DIAGNOSIS — G9340 Encephalopathy, unspecified: Secondary | ICD-10-CM | POA: Diagnosis not present

## 2022-10-31 DIAGNOSIS — J9621 Acute and chronic respiratory failure with hypoxia: Secondary | ICD-10-CM | POA: Diagnosis not present

## 2022-10-31 LAB — BLOOD GAS, VENOUS
Acid-Base Excess: 18.9 mmol/L — ABNORMAL HIGH (ref 0.0–2.0)
Bicarbonate: 48.4 mmol/L — ABNORMAL HIGH (ref 20.0–28.0)
Delivery systems: POSITIVE
FIO2: 35 %
O2 Saturation: 67 %
Patient temperature: 37
RATE: 12 resp/min
pCO2, Ven: 80 mmHg (ref 44–60)
pH, Ven: 7.39 (ref 7.25–7.43)
pO2, Ven: 41 mmHg (ref 32–45)

## 2022-10-31 LAB — CBC WITH DIFFERENTIAL/PLATELET
Abs Immature Granulocytes: 0.14 10*3/uL — ABNORMAL HIGH (ref 0.00–0.07)
Basophils Absolute: 0.1 10*3/uL (ref 0.0–0.1)
Basophils Relative: 0 %
Eosinophils Absolute: 0 10*3/uL (ref 0.0–0.5)
Eosinophils Relative: 0 %
HCT: 47.6 % (ref 39.0–52.0)
Hemoglobin: 14.6 g/dL (ref 13.0–17.0)
Immature Granulocytes: 1 %
Lymphocytes Relative: 10 %
Lymphs Abs: 1.5 10*3/uL (ref 0.7–4.0)
MCH: 30.1 pg (ref 26.0–34.0)
MCHC: 30.7 g/dL (ref 30.0–36.0)
MCV: 98.1 fL (ref 80.0–100.0)
Monocytes Absolute: 1.8 10*3/uL — ABNORMAL HIGH (ref 0.1–1.0)
Monocytes Relative: 13 %
Neutro Abs: 11 10*3/uL — ABNORMAL HIGH (ref 1.7–7.7)
Neutrophils Relative %: 76 %
Platelets: 382 10*3/uL (ref 150–400)
RBC: 4.85 MIL/uL (ref 4.22–5.81)
RDW: 13.2 % (ref 11.5–15.5)
WBC: 14.5 10*3/uL — ABNORMAL HIGH (ref 4.0–10.5)
nRBC: 0 % (ref 0.0–0.2)

## 2022-10-31 LAB — URINALYSIS, COMPLETE (UACMP) WITH MICROSCOPIC
Bacteria, UA: NONE SEEN
Bilirubin Urine: NEGATIVE
Glucose, UA: NEGATIVE mg/dL
Hgb urine dipstick: NO GROWTH — AB
Ketones, ur: NEGATIVE mg/dL
Leukocytes,Ua: NEGATIVE
Nitrite: NEGATIVE
Protein, ur: 30 mg/dL — AB
Specific Gravity, Urine: 1.025 (ref 1.005–1.030)
Squamous Epithelial / HPF: NONE SEEN /HPF (ref 0–5)
pH: 5.5 (ref 5.0–8.0)

## 2022-10-31 LAB — BLOOD GAS, ARTERIAL
Acid-Base Excess: 15.3 mmol/L — ABNORMAL HIGH (ref 0.0–2.0)
Acid-Base Excess: 13.5 mmol/L — ABNORMAL HIGH (ref 0.0–2.0)
Bicarbonate: 45.2 mmol/L — ABNORMAL HIGH (ref 20.0–28.0)
Bicarbonate: 42 mmol/L — ABNORMAL HIGH (ref 20.0–28.0)
Delivery systems: POSITIVE
Expiratory PAP: 6 cmH2O
FIO2: 30 %
Inspiratory PAP: 16 cmH2O
Mechanical Rate: 12
O2 Saturation: 95.3 %
O2 Saturation: 98.3 %
Patient temperature: 37
Patient temperature: 37
pCO2 arterial: 80 mmHg (ref 32–48)
pCO2 arterial: 71 mmHg (ref 32–48)
pH, Arterial: 7.36 (ref 7.35–7.45)
pH, Arterial: 7.38 (ref 7.35–7.45)
pO2, Arterial: 68 mmHg — ABNORMAL LOW (ref 83–108)
pO2, Arterial: 89 mmHg (ref 83–108)

## 2022-10-31 LAB — BASIC METABOLIC PANEL
Anion gap: 10 (ref 5–15)
BUN: 34 mg/dL — ABNORMAL HIGH (ref 8–23)
CO2: 35 mmol/L — ABNORMAL HIGH (ref 22–32)
Calcium: 9.2 mg/dL (ref 8.9–10.3)
Chloride: 98 mmol/L (ref 98–111)
Creatinine, Ser: 0.76 mg/dL (ref 0.61–1.24)
GFR, Estimated: >60 mL/min (ref >60)
Glucose, Bld: 136 mg/dL — ABNORMAL HIGH (ref 70–99)
Potassium: 4.7 mmol/L (ref 3.5–5.1)
Sodium: 143 mmol/L (ref 135–145)

## 2022-10-31 LAB — GLUCOSE, CAPILLARY
Glucose-Capillary: 178 mg/dL — ABNORMAL HIGH (ref 70–99)
Glucose-Capillary: 175 mg/dL — ABNORMAL HIGH (ref 70–99)
Glucose-Capillary: 153 mg/dL — ABNORMAL HIGH (ref 70–99)

## 2022-10-31 LAB — CBC
HCT: 47.1 % (ref 39.0–52.0)
Hemoglobin: 14.5 g/dL (ref 13.0–17.0)
MCH: 29.8 pg (ref 26.0–34.0)
MCHC: 30.8 g/dL (ref 30.0–36.0)
MCV: 96.9 fL (ref 80.0–100.0)
Platelets: 364 10*3/uL (ref 150–400)
RBC: 4.86 MIL/uL (ref 4.22–5.81)
RDW: 13.1 % (ref 11.5–15.5)
WBC: 14.9 10*3/uL — ABNORMAL HIGH (ref 4.0–10.5)
nRBC: 0 % (ref 0.0–0.2)

## 2022-10-31 LAB — STREP PNEUMONIAE URINARY ANTIGEN: Strep Pneumo Urinary Antigen: NEGATIVE

## 2022-10-31 LAB — MRSA NEXT GEN BY PCR, NASAL: MRSA by PCR Next Gen: NOT DETECTED

## 2022-10-31 LAB — PROCALCITONIN: Procalcitonin: 0.14 ng/mL

## 2022-10-31 LAB — RPR: RPR Ser Ql: NONREACTIVE

## 2022-10-31 MED ORDER — ENALAPRILAT 1.25 MG/ML IV SOLN
0.6250 mg | INTRAVENOUS | Status: DC | PRN
  Administered 2022-11-01: 0.625 mg via INTRAVENOUS
  Filled 2022-10-31 (×2): qty 0.5

## 2022-10-31 MED ORDER — METHYLPREDNISOLONE SODIUM SUCC 40 MG IJ SOLR
40.0000 mg | Freq: Two times a day (BID) | INTRAMUSCULAR | Status: DC
  Administered 2022-10-31 – 2022-11-01 (×3): 40 mg via INTRAVENOUS
  Filled 2022-10-31 (×3): qty 1

## 2022-10-31 MED ORDER — CHLORHEXIDINE GLUCONATE CLOTH 2 % EX PADS
6.0000 | MEDICATED_PAD | Freq: Every day | CUTANEOUS | Status: DC
  Administered 2022-11-01 – 2022-11-06 (×5): 6 via TOPICAL

## 2022-10-31 MED ORDER — BUDESONIDE 0.5 MG/2ML IN SUSP
0.5000 mg | Freq: Two times a day (BID) | RESPIRATORY_TRACT | Status: DC
  Administered 2022-10-31: 0.5 mg via RESPIRATORY_TRACT
  Filled 2022-10-31: qty 2

## 2022-10-31 MED ORDER — ENSURE ENLIVE PO LIQD
237.0000 mL | Freq: Three times a day (TID) | ORAL | Status: DC
  Administered 2022-11-03 – 2022-11-12 (×18): 237 mL via ORAL

## 2022-10-31 MED ORDER — IPRATROPIUM-ALBUTEROL 0.5-2.5 (3) MG/3ML IN SOLN
3.0000 mL | RESPIRATORY_TRACT | Status: DC
  Administered 2022-10-31 – 2022-11-01 (×5): 3 mL via RESPIRATORY_TRACT
  Filled 2022-10-31 (×5): qty 3

## 2022-10-31 MED ORDER — SODIUM CHLORIDE 0.9 % IV SOLN
1.0000 g | INTRAVENOUS | Status: AC
  Administered 2022-10-31 – 2022-11-04 (×5): 1 g via INTRAVENOUS
  Filled 2022-10-31: qty 1
  Filled 2022-10-31 (×2): qty 10
  Filled 2022-10-31 (×2): qty 1

## 2022-10-31 MED ORDER — INSULIN ASPART 100 UNIT/ML IJ SOLN
0.0000 [IU] | INTRAMUSCULAR | Status: DC
  Administered 2022-10-31 (×2): 4 [IU] via SUBCUTANEOUS
  Administered 2022-11-01: 3 [IU] via SUBCUTANEOUS
  Administered 2022-11-01: 4 [IU] via SUBCUTANEOUS
  Administered 2022-11-01: 2 [IU] via SUBCUTANEOUS
  Administered 2022-11-01 (×2): 4 [IU] via SUBCUTANEOUS
  Administered 2022-11-01 – 2022-11-02 (×3): 3 [IU] via SUBCUTANEOUS
  Administered 2022-11-02: 7 [IU] via SUBCUTANEOUS
  Administered 2022-11-02: 4 [IU] via SUBCUTANEOUS
  Filled 2022-10-31 (×10): qty 1

## 2022-10-31 MED ORDER — DEXTROSE 5 % IV SOLN
250.0000 mg | INTRAVENOUS | Status: AC
  Administered 2022-10-31 – 2022-11-01 (×2): 250 mg via INTRAVENOUS
  Filled 2022-10-31 (×2): qty 2.5

## 2022-10-31 NOTE — Progress Notes (Addendum)
Patient refused all meds and breakfast from multiple attempts by this nurse and wife at bedside. Patient lethargic, will answer questions with "yes," but only oriented to self. Dr. Jimmye Norman made aware. Bed alarm on, wife remains at bedside at this time.

## 2022-10-31 NOTE — Progress Notes (Signed)
NP notified d/t elevated HR and BP at start of shift. Hr 120s BP 173/97. Per pt's chart, pt takes 12.5mg  carvedilol BID and 10mg  Lisinopril daily. Orders received for PRN vasotec.  2115 - HR remains 100s-120s. NP aware. No new orders received.

## 2022-10-31 NOTE — Progress Notes (Signed)
PROGRESS NOTE    Gregory Haynes  [id number] DOB: Jun 28, 1941 DOA: 10/28/2022 PCP: Eulas Post, MD   Assessment & Plan:   Principal Problem:   Acute on chronic respiratory failure with hypoxia ([place]) Active Problems:   COPD exacerbation ([place])   Generalized weakness   Other hyperlipidemia   HTN, goal below 130/80   Coronary artery disease of native artery of native heart with stable angina pectoris ([place])   Memory loss  Assessment and Plan: Acute on chronic hypoxic & hypercapnic respiratory failure: likely secondary to COPD exacerbation.  Worse today & high risk further decline. ABG shows CO2 retention. Started on BiPAP & transferred to the ICU. CT chest shows irregular opacities, new compared w/ prior, findings are likely due to an atypical infection. Started on IV rocephin and continue on azithromycin. Steroids changed to IV. Continue on bronchodilators & encourage incentive spirometry. Uses oxygen at home prn and needs oxygen 24-7. ICU consulted and recs apprec    COPD exacerbation: continue on azithromycin, steroids, bronchodilators & encourage incentive spirometry. Started on IV rocephin. Started on BiPAP. Legionella, strep ordered  Pneumonia: atypical as per CT chest. Started on IV rocephin and continue on azithromycin   Acute encephalopathy: secondary to CO2 retention. Started on BiPAP and transferred to ICU. 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 Pulmon consulted & recs apprec.   Generalized weakness: PT recs SNF   Memory loss: vs delirium secondary to steroid use vs hospital delirium vs hypoxia. Continue w/ supportive care.    Hx of CAD: holding coreg, lisinopril, statin while on BiPAP   HTN: holding po anti-HTN meds while on BiPAP    HLD: holding statin while on BiPAP       DVT prophylaxis: lovenox  Code Status: full  Family Communication: discussed pt's care w/ pt's  wife at bedside, [place], and answered her questions  Disposition Plan: likely will d/c to SNF   Level of care: Med-Surg  Status is: Inpatient Remains inpatient appropriate because: severity of illness    Consultants:    Procedures:   Antimicrobials: azithromycin, rocephin    Subjective: Pt is lethargic & confused   Objective: Vitals:   10/30/22 1636 10/30/22 1931 10/30/22 1944 10/31/22 0350  BP: (!) 164/85  (!) 160/90 (!) 162/70  Pulse: 95  81 88  Resp: 18  20 18   Temp: 97.8 F (36.6 C)  97.8 F (36.6 C) 98.4 F (36.9 C)  TempSrc: Oral   Oral  SpO2: 97% 96% 96% 97%  Weight:      Height:        Intake/Output Summary (Last 24 hours) at 10/31/2022 0809 Last data filed at 10/30/2022 1900 Gross per 24 hour  Intake 0 ml  Output 450 ml  Net -450 ml   Filed Weights   10/29/22 0156  Weight: 86.3 kg    Examination:  General exam: Appears lethargic Respiratory system: diminished breath sounds Cardiovascular system: S1/S2+. No rubs or clicks Gastrointestinal system: abd is soft, NT, ND & hypoactive bowel sounds. Central nervous system: lethargic. Moves all extremities  Psychiatry: judgement and insight appears poor currently. Flat mood and affect    Data Reviewed: I have personally reviewed following labs and imaging studies  CBC: Recent Labs  Lab 10/28/22 1225 10/29/22 0649 10/31/22 0533  WBC 12.5* 10.0 14.9*  HGB 14.7 14.0 14.5  HCT 46.0 43.4 47.1  MCV 95.2 95.6 96.9  PLT 311 313  [id number]   Basic Metabolic Panel: Recent Labs  Lab 10/28/22 1225 10/29/22 0649 10/31/22 0533  NA 135 139 143  K 4.2 4.7 4.7  CL 93* 99 98  CO2 31 30 35*  GLUCOSE 191* 201* 136*  BUN 22 27* 34*  CREATININE 0.97 0.88 0.76  CALCIUM 9.3 9.1 9.2   GFR: Estimated Creatinine Clearance: 78.4 mL/min (by C-G formula based on SCr of 0.76 mg/dL). Liver Function Tests: Recent Labs  Lab 10/29/22 0649  AST 49*  ALT 28  ALKPHOS 94  BILITOT 0.7  PROT 7.8  ALBUMIN 3.2*   No  results for input(s): "LIPASE", "AMYLASE" in the last 168 hours. No results for input(s): "AMMONIA" in the last 168 hours. Coagulation Profile: No results for input(s): "INR", "PROTIME" in the last 168 hours. Cardiac Enzymes: No results for input(s): "CKTOTAL", "CKMB", "CKMBINDEX", "TROPONINI" in the last 168 hours. BNP (last 3 results) No results for input(s): "PROBNP" in the last 8760 hours. HbA1C: No results for input(s): "HGBA1C" in the last 72 hours. CBG: No results for input(s): "GLUCAP" in the last 168 hours. Lipid Profile: No results for input(s): "CHOL", "HDL", "LDLCALC", "TRIG", "CHOLHDL", "LDLDIRECT" in the last 72 hours. Thyroid Function Tests: Recent Labs    10/30/22 0717  TSH 0.141*   Anemia Panel: Recent Labs    10/30/22 0717  VITAMINB12 325   Sepsis Labs: No results for input(s): "PROCALCITON", "LATICACIDVEN" in the last 168 hours.  Recent Results (from the past 240 hour(s))  Resp panel by RT-PCR (RSV, Flu A&B, Covid) Anterior Nasal Swab     Status: None   Collection Time: 10/28/22 12:26 PM   Specimen: Anterior Nasal Swab  Result Value Ref Range Status   SARS Coronavirus 2 by RT PCR NEGATIVE NEGATIVE Final    Comment: (NOTE) SARS-CoV-2 target nucleic acids are NOT DETECTED.  The SARS-CoV-2 RNA is generally detectable in upper respiratory specimens during the acute phase of infection. The lowest concentration of SARS-CoV-2 viral copies this assay can detect is 138 copies/mL. A negative result does not preclude SARS-Cov-2 infection and should not be used as the sole basis for treatment or other patient management decisions. A negative result may occur with  improper specimen collection/handling, submission of specimen other than nasopharyngeal swab, presence of viral mutation(s) within the areas targeted by this assay, and inadequate number of viral copies(<138 copies/mL). A negative result must be combined with clinical observations, patient history, and  epidemiological information. The expected result is Negative.  Fact Sheet for Patients:  EntrepreneurPulse.com.au  Fact Sheet for Healthcare Providers:  IncredibleEmployment.be  This test is no t yet approved or cleared by the [place] FDA and  has been authorized for detection and/or diagnosis of SARS-CoV-2 by FDA under an Emergency Use Authorization (EUA). This EUA will remain  in effect (meaning this test can be used) for the duration of the COVID-19 declaration under Section 564(b)(1) of the Act, 21 U.S.C.section 360bbb-3(b)(1), unless the authorization is terminated  or revoked sooner.       Influenza A by PCR NEGATIVE NEGATIVE Final   Influenza B by PCR NEGATIVE NEGATIVE Final    Comment: (NOTE) The Xpert Xpress SARS-CoV-2/FLU/RSV plus assay is intended as an aid in the diagnosis of influenza from Nasopharyngeal swab specimens and should not be used as a sole basis for treatment. Nasal washings and aspirates are unacceptable for Xpert Xpress SARS-CoV-2/FLU/RSV testing.  Fact Sheet for Patients: EntrepreneurPulse.com.au  Fact Sheet for Healthcare Providers: IncredibleEmployment.be  This test is not yet  approved or cleared by the [place] and has been authorized for detection and/or diagnosis of SARS-CoV-2 by FDA under an Emergency Use Authorization (EUA). This EUA will remain in effect (meaning this test can be used) for the duration of the COVID-19 declaration under Section 564(b)(1) of the Act, 21 U.S.C. section 360bbb-3(b)(1), unless the authorization is terminated or revoked.     Resp Syncytial Virus by PCR NEGATIVE NEGATIVE Final    Comment: (NOTE) Fact Sheet for Patients: EntrepreneurPulse.com.au  Fact Sheet for Healthcare Providers: IncredibleEmployment.be  This test is not yet approved or cleared by the [place] FDA and has been  authorized for detection and/or diagnosis of SARS-CoV-2 by FDA under an Emergency Use Authorization (EUA). This EUA will remain in effect (meaning this test can be used) for the duration of the COVID-19 declaration under Section 564(b)(1) of the Act, 21 U.S.C. section 360bbb-3(b)(1), unless the authorization is terminated or revoked.  Performed at [facility], [address] [place], [place] [address]          Radiology Studies: No results found.      Scheduled Meds:  atorvastatin  40 mg Oral QPM   azithromycin  500 mg Oral Daily   carvedilol  12.5 mg Oral BID WC   enoxaparin (LOVENOX) injection  40 mg Subcutaneous Q24H   fluticasone furoate-vilanterol  1 puff Inhalation Daily   And   umeclidinium bromide  1 puff Inhalation Daily   folic acid  1 mg Oral Daily   ipratropium-albuterol  3 mL Nebulization BID   lisinopril  10 mg Oral Daily   predniSONE  40 mg Oral Q breakfast   Continuous Infusions:   LOS: 2 days    Time spent: 38 mins    Wyvonnia Dusky, MD [facility] Pager [phone]  If 7PM-7AM, please contact night-coverage [email or web address] 10/31/2022, 8:09 AM

## 2022-10-31 NOTE — NC FL2 (Signed)
[place] LEVEL OF CARE FORM     IDENTIFICATION  Patient Name: Gregory Haynes Birthdate: 06-30-1941 Sex: male Admission Date (Current Location): 10/28/2022  [facility] and [place] Number:  Engineering geologist and Address:         Provider Number: [id number]  Attending Physician Name and Address:  Wyvonnia Dusky, MD  Relative Name and Phone Number:       Current Level of Care: Hospital Recommended Level of Care: [place] Prior Approval Number:    Date Approved/Denied:   PASRR Number: [id number]  Discharge Plan: SNF    Current Diagnoses: Patient Active Problem List   Diagnosis Date Noted   Generalized weakness 10/29/2022   Memory loss 10/29/2022   COPD exacerbation ([place]) 10/28/2022   Acute on chronic respiratory failure with hypoxia ([place]) 10/28/2022   PAD (peripheral artery disease) ([place]) 09/27/2022   Coronary artery disease of native artery of native heart with stable angina pectoris ([place]) 06/23/2018   Dyspnea on exertion 06/23/2018   Obstructive sleep apnea 05/08/2017   Psoriatic arthritis ([place]) 10/31/2015   Absolute anemia 05/10/2015   CAFL (chronic airflow limitation) ([place]) 05/10/2015   COPD, moderate ([place]) 05/10/2015   Esophagitis, reflux 05/10/2015   HTN, goal below 130/80 05/10/2015   Positive H. pylori test 05/10/2015   Disease characterized by destruction of skeletal muscle 05/10/2015   Psoriasis 05/10/2015   Basal cell carcinoma of ear 10/24/2014   Fam hx-ischem heart disease 03/23/2010   Other hyperlipidemia 03/23/2010   Current tobacco use 03/23/2010    Orientation RESPIRATION BLADDER Height & Weight     Self  O2 (2.5 L [place]) Incontinent Weight: 86.3 kg Height:  5\' 11"  (180.3 cm)  BEHAVIORAL SYMPTOMS/MOOD NEUROLOGICAL BOWEL NUTRITION STATUS      Continent Diet (Heart Healthy Carb modified)  AMBULATORY STATUS COMMUNICATION OF NEEDS Skin   Extensive Assist Verbally Bruising                        Personal Care Assistance Level of Assistance              Functional Limitations Info             SPECIAL CARE FACTORS FREQUENCY  PT (By licensed PT), OT (By licensed OT)                    Contractures Contractures Info: Not present    Additional Factors Info  Code Status, Allergies Code Status Info: Full Allergies Info: NKDA           Current Medications (10/31/2022):  This is the current hospital active medication list Current Facility-Administered Medications  Medication Dose Route Frequency Provider Last Rate Last Admin   atorvastatin (LIPITOR) tablet 40 mg  40 mg Oral QPM Deneise Lever, MD   40 mg at 10/30/22 1655   azithromycin (ZITHROMAX) tablet 500 mg  500 mg Oral Daily Deneise Lever, MD   500 mg at 10/30/22 0902   carvedilol (COREG) tablet 12.5 mg  12.5 mg Oral BID WC Deneise Lever, MD   12.5 mg at 10/30/22 1654   enoxaparin (LOVENOX) injection 40 mg  40 mg Subcutaneous Q24H Renda Rolls, RPH   40 mg at 10/30/22 0902   feeding supplement (ENSURE ENLIVE / ENSURE PLUS) liquid 237 mL  237 mL Oral TID Wyvonnia Dusky, MD       fluticasone furoate-vilanterol (BREO ELLIPTA) 100-25 MCG/ACT 1 puff  1 puff Inhalation Daily Noralee Space, RPH   1 puff at 10/30/22 [id number]   And   umeclidinium bromide (INCRUSE ELLIPTA) 62.5 MCG/ACT 1 puff  1 puff Inhalation Daily Noralee Space, RPH   1 puff at [id number]   folic acid (FOLVITE) tablet 1 mg  1 mg Oral Daily Wieting, Richard, MD   1 mg at 10/30/22 0901   ipratropium-albuterol (DUONEB) 0.5-2.5 (3) MG/3ML nebulizer solution 3 mL  3 mL Nebulization BID Wyvonnia Dusky, MD   3 mL at 10/31/22 0722   lisinopril (ZESTRIL) tablet 10 mg  10 mg Oral Daily Deneise Lever, MD   10 mg at 10/30/22 0902   methylPREDNISolone sodium succinate (SOLU-MEDROL) 40 mg/mL injection 40 mg  40 mg Intravenous Q12H Wyvonnia Dusky, MD       ondansetron [facility]) tablet 4 mg  4 mg Oral Q6H PRN Deneise Lever,  MD       Or   ondansetron [facility]) injection 4 mg  4 mg Intravenous Q6H PRN Deneise Lever, MD         Discharge Medications: Please see discharge summary for a list of discharge medications.  Relevant Imaging Results:  Relevant Lab Results:   Additional Information ss [id number]  Beverly Sessions, RN

## 2022-10-31 NOTE — Progress Notes (Signed)
PT Cancellation Note  Patient Details Name: Gregory Haynes MRN: [id number] DOB: 09/22/1940   Cancelled Treatment:      Pt being transferred to ICU for Bipap needs due to high level of CO2 (per family). Will continue PT as appropriate, new orders or continuation of services appreciated.    Josie Dixon 10/31/2022, 1:15 PM

## 2022-10-31 NOTE — Consult Note (Addendum)
PULMONOLOGY         Date: 10/31/2022,   MRN# [id number] DEZMOND KLOCKE 1940-11-24     AdmissionWeight: 86.3 kg                 CurrentWeight: 86.3 kg  Referring provider: Dr Jimmye Norman   CHIEF COMPLAINT:   Acute on chronic hypoxemic respiratory failure   HISTORY OF PRESENT ILLNESS   HARVIR CASTLEBERRY is a 82 y.o. male with medical history significant of chronic respiratory failure on 2 L, COPD, hypertension, hyperlipidemia presenting with acute on chronic respiratory failure with hypoxia and COPD exacerbation. Family is here and able to help me with details of history as patient is somnolent.  Patient is poorly responsive somnolent. No reported sick contacts.  Wife reports that her and the patient went on vacation to the beach he ate a large meal and started getting worse after this. .  Patient was noncompliant with wearing his oxygen while on vacation.  No fevers or chills.  Quit smoking 11 years ago.  No chest pain.  No nausea or vomiting.  Has had increased O2 requirements as well as increased inhaler use at home.  Wife reports ICU admission with endotracheal intubation 10 yrs ago. Presented to the ER afebrile, hemodynamically stable.  Requiring 2 to 3 L to keep O2 sats greater than 92%.  White count 12.5, hemoglobin 14.7, platelets 311.  Creatinine 0.97.  Glucose 191.  Troponin negative x 1.  COVID flu and RSV negative. CT chest reviewed by me shows acute bronchitis on background of severe emphysema. ABG with severe hypercarbia and hypoxemia. Pulmonary consultation for further evaluation and management.    PAST MEDICAL HISTORY   Past Medical History:  Diagnosis Date   Anemia    Cancer ([place])    BCC   Cataract    COPD (chronic obstructive pulmonary disease) ([place])    Helicobacter pylori ab+    Hyperlipidemia    Hypertension    Psoriasis    Wears hearing aid in both ears      SURGICAL HISTORY   Past Surgical History:  Procedure Laterality Date   CATARACT  EXTRACTION W/PHACO Left 08/18/2019   Procedure: CATARACT EXTRACTION PHACO AND INTRAOCULAR LENS PLACEMENT (IOC) LEFT 6.68,   00:54.1,   30.8%;  Surgeon: Leandrew Koyanagi, MD;  Location: [facility];  Service: Ophthalmology;  Laterality: Left;   COLONOSCOPY       FAMILY HISTORY   Family History  Problem Relation Age of Onset   Heart attack Father    Heart disease Brother    Hyperlipidemia Brother    Alcohol abuse Brother      SOCIAL HISTORY   Social History   Tobacco Use   Smoking status: Former    Packs/day: 1.50    Years: 55.00    Additional pack years: 0.00    Total pack years: 82.50    Types: Cigarettes    Quit date: 08/13/2011    Years since quitting: 11.2   Smokeless tobacco: Never  Vaping Use   Vaping Use: Never used  Substance Use Topics   Alcohol use: Yes    Comment: occasionally   Drug use: No     MEDICATIONS    Home Medication:    Current Medication:  Current Facility-Administered Medications:    atorvastatin (LIPITOR) tablet 40 mg, 40 mg, Oral, QPM, Deneise Lever, MD, 40 mg at 10/30/22 1655   azithromycin (ZITHROMAX) 250 mg in dextrose 5 % 125 mL  IVPB, 250 mg, Intravenous, Q24H, Wyvonnia Dusky, MD   carvedilol (COREG) tablet 12.5 mg, 12.5 mg, Oral, BID WC, Deneise Lever, MD, 12.5 mg at 10/30/22 1654   enoxaparin (LOVENOX) injection 40 mg, 40 mg, Subcutaneous, Q24H, Belue, Alver Sorrow, RPH, 40 mg at 10/31/22 1043   feeding supplement (ENSURE ENLIVE / ENSURE PLUS) liquid 237 mL, 237 mL, Oral, TID, Jimmye Norman, Jamiese M, MD   fluticasone furoate-vilanterol (BREO ELLIPTA) 100-25 MCG/ACT 1 puff, 1 puff, Inhalation, Daily, 1 puff at 10/30/22 0905 **AND** umeclidinium bromide (INCRUSE ELLIPTA) 62.5 MCG/ACT 1 puff, 1 puff, Inhalation, Daily, Noralee Space, RPH, 1 puff at [id number]   folic acid (FOLVITE) tablet 1 mg, 1 mg, Oral, Daily, Wieting, Richard, MD, 1 mg at 10/30/22 0901   ipratropium-albuterol (DUONEB) 0.5-2.5 (3) MG/3ML  nebulizer solution 3 mL, 3 mL, Nebulization, BID, Eppie Gibson M, MD, 3 mL at 10/31/22 [id number]   lisinopril (ZESTRIL) tablet 10 mg, 10 mg, Oral, Daily, Deneise Lever, MD, 10 mg at 10/30/22 0902   methylPREDNISolone sodium succinate (SOLU-MEDROL) 40 mg/mL injection 40 mg, 40 mg, Intravenous, Q12H, Wyvonnia Dusky, MD, 40 mg at 10/31/22 1049   ondansetron (ZOFRAN) tablet 4 mg, 4 mg, Oral, Q6H PRN **OR** ondansetron (ZOFRAN) injection 4 mg, 4 mg, Intravenous, Q6H PRN, Deneise Lever, MD    ALLERGIES   Patient has no known allergies.     REVIEW OF SYSTEMS    Review of Systems:  Gen:  Denies  fever, sweats, chills weigh loss  HEENT: Denies blurred vision, double vision, ear pain, eye pain, hearing loss, nose bleeds, sore throat Cardiac:  No dizziness, chest pain or heaviness, chest tightness,edema Resp:   reports dyspnea chronically  Gi: Denies swallowing difficulty, stomach pain, nausea or vomiting, diarrhea, constipation, bowel incontinence Gu:  Denies bladder incontinence, burning urine Ext:   Denies Joint pain, stiffness or swelling Skin: Denies  skin rash, easy bruising or bleeding or hives Endoc:  Denies polyuria, polydipsia , polyphagia or weight change Psych:   Denies depression, insomnia or hallucinations   Other:  All other systems negative   VS: BP (!) 183/82 (BP Location: Left Arm)   Pulse 93   Temp 98 F (36.7 C) (Oral)   Resp 18   Ht 5\' 11"  (1.803 m)   Wt 86.3 kg   SpO2 96%   BMI 26.54 kg/m      PHYSICAL EXAM    GENERAL:NAD, no fevers, chills, no weakness no fatigue HEAD: Normocephalic, atraumatic.  EYES: Pupils equal, round, reactive to light. Extraocular muscles intact. No scleral icterus.  MOUTH: Moist mucosal membrane. Dentition intact. No abscess noted.  EAR, NOSE, THROAT: Clear without exudates. No external lesions.  NECK: Supple. No thyromegaly. No nodules. No JVD.  PULMONARY: decreased breath sounds with mild rhonchi worse at bases  bilaterally.  CARDIOVASCULAR: S1 and S2. Regular rate and rhythm. No murmurs, rubs, or gallops. No edema. Pedal pulses 2+ bilaterally.  GASTROINTESTINAL: Soft, nontender, nondistended. No masses. Positive bowel sounds. No hepatosplenomegaly.  MUSCULOSKELETAL: No swelling, clubbing, or edema. Range of motion full in all extremities.  NEUROLOGIC: Cranial nerves II through XII are intact. No gross focal neurological deficits. Sensation intact. Reflexes intact.  SKIN: No ulceration, lesions, rashes, or cyanosis. Skin warm and dry. Turgor intact.  PSYCHIATRIC: Mood, affect within normal limits. The patient is awake, alert and oriented x 3. Insight, judgment intact.       IMAGING     ASSESSMENT/PLAN   Acute exacerbation of COPD    -  Patient with hypercarbic encephalopathy currently somnolent.    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  -will request ICU evaluation , intensivisit notified.            Thank you for allowing me to participate in the care of this patient.   Patient/Family are satisfied with care plan and all questions have been answered.    Provider disclosure: Patient with at least one acute or chronic illness or injury that poses a threat to life or bodily function and is being managed actively during this encounter.  All of the below services have been performed independently by signing provider:  review of prior documentation from internal and or external health records.  Review of previous and current lab results.  Interview and comprehensive assessment during patient visit today. Review of current and previous chest radiographs/CT scans. Discussion of management and test interpretation with health  care team and patient/family.   This document was prepared using Dragon voice recognition software and may include unintentional dictation errors.     Ottie Glazier, M.D.  Division of Pulmonary & Critical Care Medicine

## 2022-10-31 NOTE — TOC Initial Note (Signed)
Transition of Care [facility]) - Initial/Assessment Note    Patient Details  Name: Gregory Haynes MRN: [id number] Date of Birth: 1941/04/20  Transition of Care [facility]) CM/SW Contact:    Beverly Sessions, RN Phone Number: 10/31/2022, 10:44 AM  Clinical Narrative:                  Admitted for: COPD Admitted from:home with wife [id number] Current home health/prior home health/DME: rollator, O2  Patient not A&O.  Assessment completed via phone with wife.  Therapy recommending SNF.  Wife in agreement PASRR obtained Fl2 sent for signature Bed search initiated          Patient Goals and CMS Choice            Expected Discharge Plan and Services                                              Prior Living Arrangements/Services                       Activities of Daily Living Home Assistive Devices/Equipment: Gilford Rile (specify type) ADL Screening (condition at time of admission) Patient's cognitive ability adequate to safely complete daily activities?: Yes Is the patient deaf or have difficulty hearing?: Yes Does the patient have difficulty seeing, even when wearing glasses/contacts?: No Does the patient have difficulty concentrating, remembering, or making decisions?: No Patient able to express need for assistance with ADLs?: Yes Does the patient have difficulty dressing or bathing?: No Independently performs ADLs?: Yes (appropriate for developmental age) Does the patient have difficulty walking or climbing stairs?: Yes Weakness of Legs: None Weakness of Arms/Hands: None  Permission Sought/Granted                  Emotional Assessment              Admission diagnosis:  COPD exacerbation ([place]) [J44.1] Acute on chronic respiratory failure with hypoxia ([place]) [J96.21] Patient Active Problem List   Diagnosis Date Noted   Generalized weakness 10/29/2022   Memory loss 10/29/2022   COPD exacerbation ([place]) 10/28/2022   Acute on chronic  respiratory failure with hypoxia ([place]) 10/28/2022   PAD (peripheral artery disease) ([place]) 09/27/2022   Coronary artery disease of native artery of native heart with stable angina pectoris ([place]) 06/23/2018   Dyspnea on exertion 06/23/2018   Obstructive sleep apnea 05/08/2017   Psoriatic arthritis ([place]) 10/31/2015   Absolute anemia 05/10/2015   CAFL (chronic airflow limitation) ([place]) 05/10/2015   COPD, moderate ([place]) 05/10/2015   Esophagitis, reflux 05/10/2015   HTN, goal below 130/80 05/10/2015   Positive H. pylori test 05/10/2015   Disease characterized by destruction of skeletal muscle 05/10/2015   Psoriasis 05/10/2015   Basal cell carcinoma of ear 10/24/2014   Fam hx-ischem heart disease 03/23/2010   Other hyperlipidemia 03/23/2010   Current tobacco use 03/23/2010   PCP:  Eulas Post, MD Pharmacy:   [facility] [id number] [place], [place] - [address] [place] [address] Phone: [phone] Fax: [phone]     Social Determinants of Health (SDOH) Social History: SDOH Screenings   Food Insecurity: No Food Insecurity (10/29/2022)  Housing: Low Risk  (10/29/2022)  Transportation Needs: Unmet Transportation Needs (10/29/2022)  Utilities: Not At Risk (10/29/2022)  Alcohol Screen: Low Risk  (07/01/2022)  Depression (  PHQ2-9): Low Risk  (07/01/2022)  Financial Resource Strain: Low Risk  (06/24/2018)  Physical Activity: Unknown (07/01/2019)  Social Connections: Unknown (06/24/2018)  Stress: No Stress Concern Present (07/01/2019)  Tobacco Use: Medium Risk (10/28/2022)   SDOH Interventions: Food Insecurity Interventions: Intervention Not Indicated Housing Interventions: Intervention Not Indicated Transportation Interventions: Intervention Not Indicated Utilities Interventions: Intervention Not Indicated   Readmission Risk Interventions     No data to display

## 2022-10-31 NOTE — Consult Note (Signed)
NAME:  Gregory Haynes, MRN:  [id number], DOB:  12-Sep-1940, LOS: 2 ADMISSION DATE:  10/28/2022, CONSULTATION DATE:  10/31/2022 REFERRING MD:  Dr. Lanney Gins, CHIEF COMPLAINT:  Altered Mental Status   Brief Pt Description / Synopsis:  82 y.o male admitted with acute on chronic hypoxic & hypercapnic respiratory failure in the setting of AECOPD, suspect due to atypical/viral pneumonia, along with CO2 Narcosis requiring BiPAP.  High risk for intubation.  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  He was noted to have wheezing and increased work of breathing, no known sick contacts.  The patient's wife reported that they had been on vacation to the beach and he was noncompliant with wearing his oxygen while on vacation.  He is a former smoker, quit approximately 11 years ago.  He denied chest pain, nausea, vomiting, diarrhea, hematemesis, fever, chills.  ED Course: Initial Vital Signs: Temperature 98.2 F orally, blood pressure 169/90, pulse 107, respiratory rate 22, SpO2 92% Significant Labs: Glucose 191, high-sensitivity troponin 13, WBC 12.5 COVID-19/RSV/flu PCR negative Imaging Chest X-ray>>IMPRESSION: Findings of COPD. No focal airspace consolidation.  He was admitted by the hospitalist for further workup and treatment of acute COPD exacerbation.  Please see "significant hospital events" section below for full detailed hospital course.  Pertinent  Medical History   Past Medical History:  Diagnosis Date   Anemia    Cancer ([place])    BCC   Cataract    COPD (chronic obstructive pulmonary disease) ([place])    Helicobacter pylori ab+    Hyperlipidemia    Hypertension     Psoriasis    Wears hearing aid in both ears     Micro Data:  3/18: SARS-CoV-2/RSV/flu PCR>> negative 3/21: Respiratory viral panel>> 3/21: Strep pneumo urinary antigen>> 3/21: Legionella urinary antigen>>  Antimicrobials:   Anti-infectives (From admission, onward)    Start     Dose/Rate Route Frequency Ordered Stop   10/31/22 1400  cefTRIAXone (ROCEPHIN) 1 g in sodium chloride 0.9 % 100 mL IVPB        1 g 200 mL/hr over 30 Minutes Intravenous Every 24 hours 10/31/22 1300     10/31/22 1200  azithromycin (ZITHROMAX) 250 mg in dextrose 5 % 125 mL IVPB        250 mg 127.5 mL/hr over 60 Minutes Intravenous Every 24 hours 10/31/22 1058     10/29/22 1015  azithromycin (ZITHROMAX) tablet 500 mg  Status:  Discontinued       See Hyperspace for full Linked Orders Report.   500 mg Oral Daily 10/28/22 1544 10/31/22 1058   10/28/22 1600  azithromycin (ZITHROMAX) 500 mg in sodium chloride 0.9 % 250 mL IVPB       See Hyperspace for full Linked Orders Report.   500 mg 250 mL/hr over 60 Minutes Intravenous Every 24 hours 10/28/22 1544 10/28/22 [place] [facility] Events: Including procedures, antibiotic start and stop dates in addition to other pertinent events   3/18: Presented to ED, admitted by Hospitalist. 3/19. Patient down to his baseline 2 L of oxygen. With nursing staff it took 2 people to move them around. Patient sleeping in the afternoon on reevaluation. As per patient's  wife he has not been wearing his oxygen as he normally does on vacation and has a very poor diet over the last 4 days.  3/21: More somnolent due to CO2 Narcosis. Transfer to ICU for BiPAP, PCCM consulted.  CT chest concerning for atypical/viral pneumonia, adding on Rocephin.  Interim History / Subjective:  -Patient noted to be much more somnolent today -Will withdraw from pain and moan, however not following commands -ABG with hypercapnia with pCO2 of 80, CT head is pending -Patient to be placed on  BiPAP and transferred to stepdown unit, PCCM consulted -CT chest obtained, concerning for possible atypical/viral pneumonia  Objective   Blood pressure (!) 183/82, pulse 93, temperature 98 F (36.7 C), temperature source Oral, resp. rate 18, height 5\' 11"  (1.803 m), weight 86.3 kg, SpO2 96 %.        Intake/Output Summary (Last 24 hours) at 10/31/2022 1251 Last data filed at 10/30/2022 1900 Gross per 24 hour  Intake --  Output 450 ml  Net -450 ml   Filed Weights   10/29/22 0156  Weight: 86.3 kg    Examination: General: Acute on chronically ill-appearing male, laying in bed, on 3 L nasal cannula, no acute distress HENT: Atraumatic, normocephalic, neck supple, no JVD Lungs: Diminished breath sounds throughout, even, nonlabored Cardiovascular: Regular rate and rhythm, S1-S2, no murmurs, rubs, gallops Abdomen: Soft, nontender, nondistended, no guarding rebound tenderness, bowel sounds positive x 4 Extremities: Normal bulk and tone, no deformities, no edema Neuro: Somnolent, withdraws from pain and moans, currently not following commands, pupils PERRLA GU: Deferred  Resolved Hospital Problem list     Assessment & Plan:   #Acute on Chronic Hypoxic & Hypercapnic Respiratory Failure in the setting of AECOPD CT Chest 10/31/22: IMPRESSION: 1. Numerous irregular clustered solid nodular opacities, new when compared with prior. Findings are likely due to atypical infection. -Supplemental O2 as needed to maintain O2 sats 88 to 92% -BiPAP, wean as tolerated -High risk for intubation -Follow intermittent Chest X-ray & ABG as needed -Bronchodilators  -IV Steroids (increased to Solu-Medrol 40 mg twice daily) -ABX as above (continue Azithromycin, start Ceftriaxone) -Check respiratory viral panel -Pulmonary toilet as able  #Meets SIRS Criteria (RR 28, HR >99, WBC 14.9) #Sepsis, suspect due to Atypical/Viral Pneumonia  -Monitor fever curve -Trend WBC's & Procalcitonin ~ add on  differential -Follow cultures as above -Continue empiric Azithromycin, start Ceftriaxone pending cultures & sensitivities -Check Urinalysis   #Acute Metabolic Encephalopathy due to CO2 Narcosis -Treatment of Hypercapnia as outlined above -Provide supportive care -Avoid sedating meds as able -CT Head is pending  #Hypertension PMHx: HTN, HLD, CAD -Continuous cardiac monitoring -Continue Coreg, lisinopril       Best Practice (right click and "Reselect all SmartList Selections" daily)   Diet/type: NPO DVT prophylaxis: LMWH GI prophylaxis: N/A Lines: N/A Foley:  N/A Code Status:  full code Last date of multidisciplinary goals of care discussion [N/A]  3/21: Pt's wife and daughters updated at bedside.  Labs   CBC: Recent Labs  Lab 10/28/22 1225 10/29/22 0649 10/31/22 0533  WBC 12.5* 10.0 14.9*  HGB 14.7 14.0 14.5  HCT 46.0 43.4 47.1  MCV 95.2 95.6 96.9  PLT 311 [id number]    Basic Metabolic Panel: Recent Labs  Lab 10/28/22 1225 10/29/22 0649 10/31/22 0533  NA 135 139 143  K 4.2 4.7 4.7  CL 93* 99 98  CO2 31 30 35*  GLUCOSE 191* 201* 136*  BUN 22 27* 34*  CREATININE 0.97 0.88 0.76  CALCIUM 9.3 9.1 9.2   GFR: Estimated Creatinine Clearance: 78.4 mL/min (by C-G formula based on SCr of 0.76 mg/dL). Recent Labs  Lab 10/28/22 1225 10/29/22 0649 10/31/22 0533  WBC 12.5* 10.0 14.9*    Liver Function Tests: Recent Labs  Lab 10/29/22 0649  AST 49*  ALT 28  ALKPHOS 94  BILITOT 0.7  PROT 7.8  ALBUMIN 3.2*   No results for input(s): "LIPASE", "AMYLASE" in the last 168 hours. No results for input(s): "AMMONIA" in the last 168 hours.  ABG    Component Value Date/Time   PHART 7.36 10/31/2022 1101   PCO2ART 80 (HH) 10/31/2022 1101   PO2ART 68 (L) 10/31/2022 1101   HCO3 45.2 (H) 10/31/2022 1101   O2SAT 95.3 10/31/2022 1101     Coagulation Profile: No results for input(s): "INR", "PROTIME" in the last 168 hours.  Cardiac Enzymes: No results for  input(s): "CKTOTAL", "CKMB", "CKMBINDEX", "TROPONINI" in the last 168 hours.  HbA1C: Hgb A1c MFr Bld  Date/Time Value Ref Range Status  05/02/2016 08:04 AM 6.3 (H) 4.8 - 5.6 % Final    Comment:             Pre-diabetes: 5.7 - 6.4          Diabetes: >6.4          Glycemic control for adults with diabetes: <7.0     CBG: No results for input(s): "GLUCAP" in the last 168 hours.  Review of Systems:   Unable to assess due to AMS/Lethargy   Past Medical History:  He,  has a past medical history of Anemia, Cancer ([place]), Cataract, COPD (chronic obstructive pulmonary disease) ([place]), Helicobacter pylori ab+, Hyperlipidemia, Hypertension, Psoriasis, and Wears hearing aid in both ears.   Surgical History:   Past Surgical History:  Procedure Laterality Date   CATARACT EXTRACTION W/PHACO Left 08/18/2019   Procedure: CATARACT EXTRACTION PHACO AND INTRAOCULAR LENS PLACEMENT (IOC) LEFT 6.68,   00:54.1,   30.8%;  Surgeon: Leandrew Koyanagi, MD;  Location: [place] [facility];  Service: Ophthalmology;  Laterality: Left;   COLONOSCOPY       Social History:   reports that he quit smoking about 11 years ago. His smoking use included cigarettes. He has a 82.50 pack-year smoking history. He has never used smokeless tobacco. He reports current alcohol use. He reports that he does not use drugs.   Family History:  His family history includes Alcohol abuse in his brother; Heart attack in his father; Heart disease in his brother; Hyperlipidemia in his brother.   Allergies No Known Allergies   Home Medications  Prior to Admission medications   Medication Sig Start Date End Date Taking? Authorizing Provider  albuterol (VENTOLIN HFA) 108 (90 Base) MCG/ACT inhaler Inhale 1-2 puffs into the lungs every 4 (four) hours as needed for wheezing or shortness of breath. 07/01/22  Yes [name], PA-C  atorvastatin (LIPITOR) 40 MG tablet Take 1 tablet (40 mg total) by mouth every evening. 05/01/22  Yes  Eulas Post, MD  carvedilol (COREG) 12.5 MG tablet Take 1 tablet (12.5 mg total) by mouth 2 (two) times daily with a meal. 05/01/22  Yes Eulas Post, MD  clobetasol cream (TEMOVATE) 1.69 % Apply 1 Application topically 2 (two) times daily.   Yes [provider]  Fluticasone-Umeclidin-Vilant (TRELEGY ELLIPTA) 100-62.5-25 MCG/INH AEPB Inhale 1 puff into the lungs daily. 08/26/17  Yes [provider]  folic acid (FOLVITE) 1 MG tablet Take 1 mg by mouth daily. Unsure dose  Yes [provider]  lisinopril (ZESTRIL) 10 MG tablet Take 1 tablet (10 mg total) by mouth daily. 05/01/22  Yes Eulas Post, MD  OXYGEN Inhale 2 L/hr into the lungs. At night and as needed during day.    [provider]     Critical care time: 50 minutes     Darel Hong, AGACNP-BC [facility] [place] epic messenger for cross cover needs If after hours, please call E-link

## 2022-11-01 ENCOUNTER — Inpatient Hospital Stay: Payer: Medicare HMO

## 2022-11-01 DIAGNOSIS — J189 Pneumonia, unspecified organism: Secondary | ICD-10-CM | POA: Diagnosis not present

## 2022-11-01 DIAGNOSIS — J9601 Acute respiratory failure with hypoxia: Secondary | ICD-10-CM | POA: Diagnosis not present

## 2022-11-01 DIAGNOSIS — J9621 Acute and chronic respiratory failure with hypoxia: Secondary | ICD-10-CM | POA: Diagnosis not present

## 2022-11-01 DIAGNOSIS — J441 Chronic obstructive pulmonary disease with (acute) exacerbation: Secondary | ICD-10-CM | POA: Diagnosis not present

## 2022-11-01 DIAGNOSIS — G928 Other toxic encephalopathy: Secondary | ICD-10-CM | POA: Diagnosis not present

## 2022-11-01 DIAGNOSIS — J9602 Acute respiratory failure with hypercapnia: Secondary | ICD-10-CM | POA: Diagnosis not present

## 2022-11-01 DIAGNOSIS — G9340 Encephalopathy, unspecified: Secondary | ICD-10-CM | POA: Diagnosis not present

## 2022-11-01 LAB — BASIC METABOLIC PANEL
Anion gap: 15 (ref 5–15)
BUN: 29 mg/dL — ABNORMAL HIGH (ref 8–23)
CO2: 38 mmol/L — ABNORMAL HIGH (ref 22–32)
Calcium: 9.4 mg/dL (ref 8.9–10.3)
Chloride: 94 mmol/L — ABNORMAL LOW (ref 98–111)
Creatinine, Ser: 0.76 mg/dL (ref 0.61–1.24)
GFR, Estimated: >60 mL/min (ref >60)
Glucose, Bld: 158 mg/dL — ABNORMAL HIGH (ref 70–99)
Potassium: 4.6 mmol/L (ref 3.5–5.1)
Sodium: 147 mmol/L — ABNORMAL HIGH (ref 135–145)

## 2022-11-01 LAB — BLOOD GAS, VENOUS
Acid-Base Excess: 17.1 mmol/L — ABNORMAL HIGH (ref 0.0–2.0)
Bicarbonate: 45.6 mmol/L — ABNORMAL HIGH (ref 20.0–28.0)
O2 Saturation: 94.7 %
Patient temperature: 37
pCO2, Ven: 72 mmHg (ref 44–60)
pH, Ven: 7.41 (ref 7.25–7.43)
pO2, Ven: 68 mmHg — ABNORMAL HIGH (ref 32–45)

## 2022-11-01 LAB — CBC
HCT: 46.5 % (ref 39.0–52.0)
Hemoglobin: 14.5 g/dL (ref 13.0–17.0)
MCH: 30.4 pg (ref 26.0–34.0)
MCHC: 31.2 g/dL (ref 30.0–36.0)
MCV: 97.5 fL (ref 80.0–100.0)
Platelets: 306 10*3/uL (ref 150–400)
RBC: 4.77 MIL/uL (ref 4.22–5.81)
RDW: 13.1 % (ref 11.5–15.5)
WBC: 12.7 10*3/uL — ABNORMAL HIGH (ref 4.0–10.5)
nRBC: 0 % (ref 0.0–0.2)

## 2022-11-01 LAB — GLUCOSE, CAPILLARY
Glucose-Capillary: 136 mg/dL — ABNORMAL HIGH (ref 70–99)
Glucose-Capillary: 131 mg/dL — ABNORMAL HIGH (ref 70–99)
Glucose-Capillary: 162 mg/dL — ABNORMAL HIGH (ref 70–99)
Glucose-Capillary: 177 mg/dL — ABNORMAL HIGH (ref 70–99)
Glucose-Capillary: 161 mg/dL — ABNORMAL HIGH (ref 70–99)
Glucose-Capillary: 150 mg/dL — ABNORMAL HIGH (ref 70–99)

## 2022-11-01 LAB — BLOOD GAS, ARTERIAL
Acid-Base Excess: 17.7 mmol/L — ABNORMAL HIGH (ref 0.0–2.0)
Bicarbonate: 45.8 mmol/L — ABNORMAL HIGH (ref 20.0–28.0)
Delivery systems: POSITIVE
Expiratory PAP: 6 cmH2O
FIO2: 35 %
Inspiratory PAP: 16 cmH2O
O2 Saturation: 98.9 %
Patient temperature: 37
RATE: 12 resp/min
pCO2 arterial: 69 mmHg (ref 32–48)
pH, Arterial: 7.43 (ref 7.35–7.45)
pO2, Arterial: 93 mmHg (ref 83–108)

## 2022-11-01 LAB — RESPIRATORY PANEL BY PCR

## 2022-11-01 LAB — BRAIN NATRIURETIC PEPTIDE: B Natriuretic Peptide: 76.9 pg/mL (ref 0.0–100.0)

## 2022-11-01 MED ORDER — DEXMEDETOMIDINE HCL IN NACL 400 MCG/100ML IV SOLN
0.0000 ug/kg/h | INTRAVENOUS | Status: DC
  Administered 2022-11-01: 0.3 ug/kg/h via INTRAVENOUS
  Administered 2022-11-01: 0.8 ug/kg/h via INTRAVENOUS
  Administered 2022-11-02: 0.2 ug/kg/h via INTRAVENOUS
  Filled 2022-11-01 (×2): qty 100

## 2022-11-01 MED ORDER — HALOPERIDOL LACTATE 5 MG/ML IJ SOLN: 2.0000 mg | Freq: Once | INTRAMUSCULAR | Status: AC

## 2022-11-01 MED ORDER — LACTATED RINGERS IV BOLUS
250.0000 mL | Freq: Once | INTRAVENOUS | Status: AC
  Administered 2022-11-01: 250 mL via INTRAVENOUS

## 2022-11-01 MED ORDER — ALBUTEROL SULFATE (2.5 MG/3ML) 0.083% IN NEBU
INHALATION_SOLUTION | RESPIRATORY_TRACT | Status: AC
  Filled 2022-11-01: qty 3

## 2022-11-01 MED ORDER — DEXTROSE 5 % IV SOLN: INTRAVENOUS | Status: DC

## 2022-11-01 MED ORDER — HALOPERIDOL LACTATE 5 MG/ML IJ SOLN: 1.0000 mg | Freq: Once | INTRAMUSCULAR | Status: DC

## 2022-11-01 MED ORDER — HALOPERIDOL LACTATE 5 MG/ML IJ SOLN
INTRAMUSCULAR | Status: AC
  Administered 2022-11-01: 2 mg via INTRAVENOUS
  Filled 2022-11-01: qty 1

## 2022-11-01 MED ORDER — IPRATROPIUM-ALBUTEROL 0.5-2.5 (3) MG/3ML IN SOLN: 3.0000 mL | Freq: Four times a day (QID) | RESPIRATORY_TRACT | Status: DC

## 2022-11-01 MED ORDER — LABETALOL HCL 5 MG/ML IV SOLN
5.0000 mg | INTRAVENOUS | Status: DC | PRN
  Administered 2022-11-02 – 2022-11-03 (×4): 10 mg via INTRAVENOUS
  Administered 2022-11-03: 5 mg via INTRAVENOUS
  Filled 2022-11-01 (×4): qty 4

## 2022-11-01 MED ORDER — ALBUTEROL SULFATE (2.5 MG/3ML) 0.083% IN NEBU
2.5000 mg | INHALATION_SOLUTION | RESPIRATORY_TRACT | Status: DC | PRN
  Administered 2022-11-01 – 2022-11-10 (×3): 2.5 mg via RESPIRATORY_TRACT
  Filled 2022-11-01 (×2): qty 3

## 2022-11-01 MED ORDER — IPRATROPIUM-ALBUTEROL 0.5-2.5 (3) MG/3ML IN SOLN
3.0000 mL | RESPIRATORY_TRACT | Status: DC
  Administered 2022-11-01 – 2022-11-02 (×9): 3 mL via RESPIRATORY_TRACT
  Filled 2022-11-01 (×9): qty 3

## 2022-11-01 MED ORDER — METHYLPREDNISOLONE SODIUM SUCC 40 MG IJ SOLR
20.0000 mg | Freq: Two times a day (BID) | INTRAMUSCULAR | Status: DC
  Administered 2022-11-01 – 2022-11-02 (×3): 20 mg via INTRAVENOUS
  Filled 2022-11-01 (×3): qty 1

## 2022-11-01 MED ORDER — DEXMEDETOMIDINE HCL IN NACL 400 MCG/100ML IV SOLN
INTRAVENOUS | Status: AC
  Administered 2022-11-01: 1.2 ug/kg/h via INTRAVENOUS
  Filled 2022-11-01: qty 100

## 2022-11-01 MED ORDER — LABETALOL HCL 5 MG/ML IV SOLN: 10.0000 mg | INTRAVENOUS | Status: DC | PRN

## 2022-11-01 NOTE — Plan of Care (Signed)
Continuing with plan of care. 

## 2022-11-01 NOTE — Progress Notes (Signed)
Patient was trialed off of bipap for a short period on nasal canula before requiring replacement of bipap.  Patient continues to attempt to take off bipap, respiratory therapy called to bedside and  Dr. Jimmye Norman notified.  Patient continues on bipap at this time and agitated.

## 2022-11-01 NOTE — Progress Notes (Signed)
NAME:  Gregory Haynes, MRN:  [id number], DOB:  October 22, 1940, LOS: 3 ADMISSION DATE:  10/28/2022, CONSULTATION DATE:  10/31/2022 REFERRING MD:  Dr. Lanney Gins, CHIEF COMPLAINT:  Altered Mental Status   Brief Pt Description / Synopsis:  82 y.o male admitted with acute on chronic hypoxic & hypercapnic respiratory failure in the setting of AECOPD, suspect due to atypical/viral pneumonia, along with CO2 Narcosis requiring BiPAP.  High risk for intubation.  History of Present Illness:  Gregory Haynes is a 82 year old male with a past medical history significant for chronic hypoxic respiratory failure wearing 2 L supplemental oxygen as needed, COPD, hypertension, hyperlipidemia who presented to [facility] ED on 10/28/2022 due to progressive shortness of breath 3 to 4 days prior to presentation with increasing oxygen requirements.  He was noted to have wheezing and increased work of breathing, no known sick contacts.  The patient's wife reported that they had been on vacation to the beach and he was noncompliant with wearing his oxygen while on vacation.  He is a former smoker, quit approximately 11 years ago.  He denied chest pain, nausea, vomiting, diarrhea, hematemesis, fever, chills.  ED Course: Initial Vital Signs: Temperature 98.2 F orally, blood pressure 169/90, pulse 107, respiratory rate 22, SpO2 92% Significant Labs: Glucose 191, high-sensitivity troponin 13, WBC 12.5 COVID-19/RSV/flu PCR negative Imaging Chest X-ray>>IMPRESSION: Findings of COPD. No focal airspace consolidation.  He was admitted by the hospitalist for further workup and treatment of acute COPD exacerbation.  Please see "significant hospital events" section below for full detailed hospital course.  Pertinent  Medical History   Past Medical History:  Diagnosis Date   Anemia    Cancer ([place])    BCC   Cataract    COPD (chronic obstructive pulmonary disease) ([place])    Helicobacter pylori ab+    Hyperlipidemia    Hypertension     Psoriasis    Wears hearing aid in both ears     Micro Data:  3/18: SARS-CoV-2/RSV/flu PCR>> negative 3/21: Respiratory viral panel>> negative 3/21: Strep pneumo urinary antigen>>negative 3/21: Legionella urinary antigen>>  Antimicrobials:   Anti-infectives (From admission, onward)    Start     Dose/Rate Route Frequency Ordered Stop   10/31/22 1400  cefTRIAXone (ROCEPHIN) 1 g in sodium chloride 0.9 % 100 mL IVPB        1 g 200 mL/hr over 30 Minutes Intravenous Every 24 hours 10/31/22 1300     10/31/22 1200  azithromycin (ZITHROMAX) 250 mg in dextrose 5 % 125 mL IVPB        250 mg 127.5 mL/hr over 60 Minutes Intravenous Every 24 hours 10/31/22 1058     10/29/22 1015  azithromycin (ZITHROMAX) tablet 500 mg  Status:  Discontinued       See Hyperspace for full Linked Orders Report.   500 mg Oral Daily 10/28/22 1544 10/31/22 1058   10/28/22 1600  azithromycin (ZITHROMAX) 500 mg in sodium chloride 0.9 % 250 mL IVPB       See Hyperspace for full Linked Orders Report.   500 mg 250 mL/hr over 60 Minutes Intravenous Every 24 hours 10/28/22 1544 10/28/22 [place] [facility] Events: Including procedures, antibiotic start and stop dates in addition to other pertinent events   3/18: Presented to ED, admitted by Hospitalist. 3/19. Patient down to his baseline 2 L of oxygen. With nursing staff it took 2 people to move them around. Patient sleeping in the afternoon on reevaluation. As per  patient's wife he has not been wearing his oxygen as he normally does on vacation and has a very poor diet over the last 4 days.  3/21: More somnolent due to CO2 Narcosis. Transfer to ICU for BiPAP, PCCM consulted.  CT chest concerning for atypical/viral pneumonia, adding on Rocephin. 3/22: Mentation improved initially, initially tolerating trial off BiPAP.  Later developed severe agitation and delirium requiring Precedex.  Interim History / Subjective:  -No significant events noted  overnight -Afebrile, hemodynamically stable, no vasopressors -ABG improved, pt arousing to voice and following commands ~ will trial off BiPAP  -Initially tolerating off BiPAP ~ but became severely agitated and delirious, noncompliant with oxygen therapy with resultant hypoxia (O2 sats 70's) ~ requiring initiation of Precedex gtt and reapplication of BiPAP -Infectious workup negative so far   Objective   Blood pressure (!) 158/81, pulse 98, temperature 98.8 F (37.1 C), temperature source Axillary, resp. rate 17, height 5\' 11"  (1.803 m), weight 86.3 kg, SpO2 97 %.    FiO2 (%):  [35 %] 35 %   Intake/Output Summary (Last 24 hours) at 11/01/2022 0730 Last data filed at 11/01/2022 0300 Gross per 24 hour  Intake 228 ml  Output 600 ml  Net -372 ml    Filed Weights   10/29/22 0156  Weight: 86.3 kg    Examination: General: Acute on chronically ill-appearing male, laying in bed, on BIPAP, no acute distress HENT: Atraumatic, normocephalic, neck supple, no JVD Lungs: Diminished breath sounds throughout, BiPAP assisted,  even, nonlabored Cardiovascular: Regular rate and rhythm, S1-S2, no murmurs, rubs, gallops Abdomen: Soft, nontender, nondistended, no guarding rebound tenderness, bowel sounds positive x 4 Extremities: Normal bulk and tone, no deformities, no edema Neuro: Sleeping, arouses easily to voice, moves all extremities to commands, no focal deficits noted, difficult to assess orientation with BiPAP, pupils PERRLA GU: External male catheter draining yellow urine  Resolved Hospital Problem list     Assessment & Plan:   #Acute on Chronic Hypoxic & Hypercapnic Respiratory Failure in the setting of AECOPD CT Chest 10/31/22: IMPRESSION: 1. Numerous irregular clustered solid nodular opacities, new when compared with prior. Findings are likely due to atypical infection. -Supplemental O2 as needed to maintain O2 sats 88 to 92% -BiPAP, wean as tolerated (needs mandatory BiPAP qhs going  forward) -High risk for intubation -Follow intermittent Chest X-ray & ABG as needed -Bronchodilators  -IV Steroids (decrease to 20 mg BID on 3/22 due to delirium) -ABX as above  -Pulmonary toilet as able  #Meets SIRS Criteria (RR 28, HR >99, WBC 14.9) #Sepsis, suspect due to Atypical/Viral Pneumonia  -Monitor fever curve -Trend WBC's & Procalcitonin ~ add on differential -Follow cultures as above -Continue empiric Azithromycin & Ceftriaxone Ceftriaxone pending cultures & sensitivities  #Acute Metabolic Encephalopathy due to CO2 Narcosis ~ IMPROVING #Acute Delirium, suspect due to ICU environment along with high dose steroids -Treatment of Hypercapnia as outlined above -Provide supportive care -Precedex as needed  #Hypertension PMHx: HTN, HLD, CAD -Continuous cardiac monitoring -Holding Coreg, lisinopril, & statin due to AMS -Prn Labetalol and Hydralazine for tachycardia/hypertension     Patient is critically ill superimposed on severe COPD at baseline and noncompliance with oxygen therapy.  Prognosis is guarded, high risk for further decompensation requiring intubation, cardiac arrest and death.  Given his severe COPD at baseline, would be very hard to wean from mechanical ventilation if he had to be intubated.  Recommend DNR/DNI status.   Best Practice (right click and "Reselect all SmartList Selections" daily)  Diet/type: NPO DVT prophylaxis: LMWH GI prophylaxis: N/A Lines: N/A Foley:  N/A Code Status:  full code Last date of multidisciplinary goals of care discussion [3/22]  3/22: Pt's wife and daughters updated at bedside.  Labs   CBC: Recent Labs  Lab 10/28/22 1225 10/29/22 0649 10/31/22 0533 11/01/22 0614  WBC 12.5* 10.0 14.5*  14.9* 12.7*  NEUTROABS  --   --  11.0*  --   HGB 14.7 14.0 14.6  14.5 14.5  HCT 46.0 43.4 47.6  47.1 46.5  MCV 95.2 95.6 98.1  96.9 97.5  PLT 311 313 382  364 306     Basic Metabolic Panel: Recent Labs  Lab  10/28/22 1225 10/29/22 0649 10/31/22 0533 11/01/22 0614  NA 135 139 143 147*  K 4.2 4.7 4.7 4.6  CL 93* 99 98 94*  CO2 31 30 35* 38*  GLUCOSE 191* 201* 136* 158*  BUN 22 27* 34* 29*  CREATININE 0.97 0.88 0.76 0.76  CALCIUM 9.3 9.1 9.2 9.4    GFR: Estimated Creatinine Clearance: 78.4 mL/min (by C-G formula based on SCr of 0.76 mg/dL). Recent Labs  Lab 10/28/22 1225 10/29/22 0649 10/31/22 0533 11/01/22 0614  PROCALCITON  --   --  0.14  --   WBC 12.5* 10.0 14.5*  14.9* 12.7*     Liver Function Tests: Recent Labs  Lab 10/29/22 0649  AST 49*  ALT 28  ALKPHOS 94  BILITOT 0.7  PROT 7.8  ALBUMIN 3.2*    No results for input(s): "LIPASE", "AMYLASE" in the last 168 hours. No results for input(s): "AMMONIA" in the last 168 hours.  ABG    Component Value Date/Time   PHART 7.43 11/01/2022 0530   PCO2ART 69 (HH) 11/01/2022 0530   PO2ART 93 11/01/2022 0530   HCO3 45.8 (H) 11/01/2022 0530   O2SAT 98.9 11/01/2022 0530     Coagulation Profile: No results for input(s): "INR", "PROTIME" in the last 168 hours.  Cardiac Enzymes: No results for input(s): "CKTOTAL", "CKMB", "CKMBINDEX", "TROPONINI" in the last 168 hours.  HbA1C: Hgb A1c MFr Bld  Date/Time Value Ref Range Status  05/02/2016 08:04 AM 6.3 (H) 4.8 - 5.6 % Final    Comment:             Pre-diabetes: 5.7 - 6.4          Diabetes: >6.4          Glycemic control for adults with diabetes: <7.0     CBG: Recent Labs  Lab 10/31/22 1324 10/31/22 2004 10/31/22 2321 11/01/22 0343  GLUCAP 178* 175* 153* 136*    Review of Systems:   Unable to assess due to AMS/Lethargy   Past Medical History:  He,  has a past medical history of Anemia, Cancer ([place]), Cataract, COPD (chronic obstructive pulmonary disease) ([place]), Helicobacter pylori ab+, Hyperlipidemia, Hypertension, Psoriasis, and Wears hearing aid in both ears.   Surgical History:   Past Surgical History:  Procedure Laterality Date   CATARACT  EXTRACTION W/PHACO Left 08/18/2019   Procedure: CATARACT EXTRACTION PHACO AND INTRAOCULAR LENS PLACEMENT (IOC) LEFT 6.68,   00:54.1,   30.8%;  Surgeon: Leandrew Koyanagi, MD;  Location: [place];  Service: Ophthalmology;  Laterality: Left;   COLONOSCOPY       Social History:   reports that he quit smoking about 11 years ago. His smoking use included cigarettes. He has a 82.50 pack-year smoking history. He has never used smokeless tobacco. He reports current alcohol use. He reports that he  does not use drugs.   Family History:  His family history includes Alcohol abuse in his brother; Heart attack in his father; Heart disease in his brother; Hyperlipidemia in his brother.   Allergies No Known Allergies   Home Medications  Prior to Admission medications   Medication Sig Start Date End Date Taking? Authorizing Provider  albuterol (VENTOLIN HFA) 108 (90 Base) MCG/ACT inhaler Inhale 1-2 puffs into the lungs every 4 (four) hours as needed for wheezing or shortness of breath. 07/01/22  Yes [name], PA-C  atorvastatin (LIPITOR) 40 MG tablet Take 1 tablet (40 mg total) by mouth every evening. 05/01/22  Yes Eulas Post, MD  carvedilol (COREG) 12.5 MG tablet Take 1 tablet (12.5 mg total) by mouth 2 (two) times daily with a meal. 05/01/22  Yes Eulas Post, MD  clobetasol cream (TEMOVATE) [id number] % Apply 1 Application topically 2 (two) times daily.   Yes [provider]  Fluticasone-Umeclidin-Vilant (TRELEGY ELLIPTA) 100-62.5-25 MCG/INH AEPB Inhale 1 puff into the lungs daily. 08/26/17  Yes [provider]  folic acid (FOLVITE) 1 MG tablet Take 1 mg by mouth daily. Unsure dose   Yes [provider]  lisinopril (ZESTRIL) 10 MG tablet Take 1 tablet (10 mg total) by mouth daily. 05/01/22  Yes Eulas Post, MD  OXYGEN Inhale 2 L/hr into the lungs. At night and as needed during day.    [provider]     Critical care time: 40 minutes      Darel Hong, AGACNP-BC [facility] [place] epic messenger for cross cover needs If after hours, please call E-link

## 2022-11-01 NOTE — Progress Notes (Signed)
PROGRESS NOTE    Gregory Haynes  [id number] DOB: 1941/03/23 DOA: 10/28/2022 PCP: Eulas Post, MD   Assessment & Plan:   Principal Problem:   Acute on chronic respiratory failure with hypoxia ([place]) Active Problems:   COPD exacerbation ([place])   Generalized weakness   Other hyperlipidemia   HTN, goal below 130/80   Coronary artery disease of native artery of native heart with stable angina pectoris ([place])   Memory loss   CO2 narcosis   Toxic metabolic encephalopathy  Assessment and Plan: Acute on chronic hypoxic & hypercapnic respiratory failure: likely secondary to COPD exacerbation.  Worse today & high risk further decline. ABG shows CO2 retention. Continue on BiPAP but keeps taking off BiPAP. Haldol x 1 given w/o any improvement. Started on precedex drip by ICU. CT chest shows irregular opacities, new compared w/ prior, findings are likely due to an atypical infection. Continue on IV rocephin. Continue on bronchodilators & encourage incentive spirometry. Uses oxygen at home prn and needs oxygen 24-7.    COPD exacerbation: continue on IV rocephin,steroids, bronchodilators. Continue on BiPAP. Strep is neg. Legionella is pending  Pneumonia: atypical as per CT chest. Continue on IV rocephin, steroids  Acute encephalopathy: secondary to CO2 retention. Continue on BiPAP but pt is agitated & confused and keeps taking off BiPAP. Haldol x 1 given w/o any improvement. Started on precedex drip by ICU. ICU will take over pt's care today   Hypernatremia: free water deficit is 0.7L . Started on D5W  Generalized weakness: PT recs SNF   Memory loss: vs delirium secondary to steroid use vs hospital delirium vs hypoxia vs hypercapnia. Re-orient prn   Hx of CAD: holding coreg, statin, lisinopril while on BiPAP    HTN: holding anti-HTN meds while on BiPAP     HLD: holding statin while on BiPAP       DVT prophylaxis: lovenox  Code Status: full  Family Communication: Disposition  Plan: likely will d/c to SNF   Level of care: ICU  Status is: Inpatient Remains inpatient appropriate because: severity of illness. ICU will take over pt's care today and hospitalist will sign off     Consultants:    Procedures:   Antimicrobials:  rocephin    Subjective: Pt is agitated & confused  Objective: Vitals:   11/01/22 0700 11/01/22 0721 11/01/22 0800 11/01/22 0845  BP: 121/60  (!) 145/76   Pulse: 81 98 (!) 104 (!) 101  Resp: (!) 24 (!) 22 (!) 22 14  Temp:   99 F (37.2 C)   TempSrc:   Axillary   SpO2: 94% 97% 98% 92%  Weight:      Height:        Intake/Output Summary (Last 24 hours) at 11/01/2022 0901 Last data filed at 11/01/2022 0300 Gross per 24 hour  Intake 228 ml  Output 600 ml  Net -372 ml   Filed Weights   10/29/22 0156  Weight: 86.3 kg    Examination:  General exam: appears agitated Respiratory system: decreased breath sounds b/l Cardiovascular system: S1 & S2+. No rubs or clicks  Gastrointestinal system: abd is soft, NT, ND & hypoactive bowel sounds Central nervous system: moves all extremities  Psychiatry: judgement and insight appears poor. Agitated mood    Data Reviewed: I have personally reviewed following labs and imaging studies  CBC: Recent Labs  Lab 10/28/22 1225 10/29/22 0649 10/31/22 0533 11/01/22 0614  WBC 12.5* 10.0 14.5*  14.9* 12.7*  NEUTROABS  --   --  11.0*  --   HGB 14.7 14.0 14.6  14.5 14.5  HCT 46.0 43.4 47.6  47.1 46.5  MCV 95.2 95.6 98.1  96.9 97.5  PLT 311 313 382  364 [id number]   Basic Metabolic Panel: Recent Labs  Lab 10/28/22 1225 10/29/22 0649 10/31/22 0533 11/01/22 0614  NA 135 139 143 147*  K 4.2 4.7 4.7 4.6  CL 93* 99 98 94*  CO2 31 30 35* 38*  GLUCOSE 191* 201* 136* 158*  BUN 22 27* 34* 29*  CREATININE 0.97 0.88 0.76 0.76  CALCIUM 9.3 9.1 9.2 9.4   GFR: Estimated Creatinine Clearance: 78.4 mL/min (by C-G formula based on SCr of 0.76 mg/dL). Liver Function Tests: Recent Labs  Lab  10/29/22 0649  AST 49*  ALT 28  ALKPHOS 94  BILITOT 0.7  PROT 7.8  ALBUMIN 3.2*   No results for input(s): "LIPASE", "AMYLASE" in the last 168 hours. No results for input(s): "AMMONIA" in the last 168 hours. Coagulation Profile: No results for input(s): "INR", "PROTIME" in the last 168 hours. Cardiac Enzymes: No results for input(s): "CKTOTAL", "CKMB", "CKMBINDEX", "TROPONINI" in the last 168 hours. BNP (last 3 results) No results for input(s): "PROBNP" in the last 8760 hours. HbA1C: No results for input(s): "HGBA1C" in the last 72 hours. CBG: Recent Labs  Lab 10/31/22 1324 10/31/22 2004 10/31/22 2321 11/01/22 0343 11/01/22 0746  GLUCAP 178* 175* 153* 136* 131*   Lipid Profile: No results for input(s): "CHOL", "HDL", "LDLCALC", "TRIG", "CHOLHDL", "LDLDIRECT" in the last 72 hours. Thyroid Function Tests: Recent Labs    10/30/22 0717  TSH 0.141*   Anemia Panel: Recent Labs    10/30/22 0717  VITAMINB12 325   Sepsis Labs: Recent Labs  Lab 10/31/22 0533  PROCALCITON 0.14    Recent Results (from the past 240 hour(s))  Resp panel by RT-PCR (RSV, Flu A&B, Covid) Anterior Nasal Swab     Status: None   Collection Time: 10/28/22 12:26 PM   Specimen: Anterior Nasal Swab  Result Value Ref Range Status   SARS Coronavirus 2 by RT PCR NEGATIVE NEGATIVE Final    Comment: (NOTE) SARS-CoV-2 target nucleic acids are NOT DETECTED.  The SARS-CoV-2 RNA is generally detectable in upper respiratory specimens during the acute phase of infection. The lowest concentration of SARS-CoV-2 viral copies this assay can detect is 138 copies/mL. A negative result does not preclude SARS-Cov-2 infection and should not be used as the sole basis for treatment or other patient management decisions. A negative result may occur with  improper specimen collection/handling, submission of specimen other than nasopharyngeal swab, presence of viral mutation(s) within the areas targeted by this  assay, and inadequate number of viral copies(<138 copies/mL). A negative result must be combined with clinical observations, patient history, and epidemiological information. The expected result is Negative.  Fact Sheet for Patients:  EntrepreneurPulse.com.au  Fact Sheet for Healthcare Providers:  IncredibleEmployment.be  This test is no t yet approved or cleared by the [place] FDA and  has been authorized for detection and/or diagnosis of SARS-CoV-2 by FDA under an Emergency Use Authorization (EUA). This EUA will remain  in effect (meaning this test can be used) for the duration of the COVID-19 declaration under Section 564(b)(1) of the Act, 21 U.S.C.section 360bbb-3(b)(1), unless the authorization is terminated  or revoked sooner.       Influenza A by PCR NEGATIVE NEGATIVE Final   Influenza B by PCR NEGATIVE NEGATIVE Final    Comment: (NOTE) The Xpert Xpress  SARS-CoV-2/FLU/RSV plus assay is intended as an aid in the diagnosis of influenza from Nasopharyngeal swab specimens and should not be used as a sole basis for treatment. Nasal washings and aspirates are unacceptable for Xpert Xpress SARS-CoV-2/FLU/RSV testing.  Fact Sheet for Patients: EntrepreneurPulse.com.au  Fact Sheet for Healthcare Providers: IncredibleEmployment.be  This test is not yet approved or cleared by the [place] FDA and has been authorized for detection and/or diagnosis of SARS-CoV-2 by FDA under an Emergency Use Authorization (EUA). This EUA will remain in effect (meaning this test can be used) for the duration of the COVID-19 declaration under Section 564(b)(1) of the Act, 21 U.S.C. section 360bbb-3(b)(1), unless the authorization is terminated or revoked.     Resp Syncytial Virus by PCR NEGATIVE NEGATIVE Final    Comment: (NOTE) Fact Sheet for Patients: EntrepreneurPulse.com.au  Fact Sheet for  Healthcare Providers: IncredibleEmployment.be  This test is not yet approved or cleared by the [place] FDA and has been authorized for detection and/or diagnosis of SARS-CoV-2 by FDA under an Emergency Use Authorization (EUA). This EUA will remain in effect (meaning this test can be used) for the duration of the COVID-19 declaration under Section 564(b)(1) of the Act, 21 U.S.C. section 360bbb-3(b)(1), unless the authorization is terminated or revoked.  Performed at [facility], [place], [place] [address]   Respiratory (~20 pathogens) panel by PCR     Status: None   Collection Time: 10/31/22  1:31 PM   Specimen: Nasopharyngeal Swab; Respiratory  Result Value Ref Range Status   Adenovirus NOT DETECTED NOT DETECTED Final   Coronavirus 229E NOT DETECTED NOT DETECTED Final    Comment: (NOTE) The Coronavirus on the Respiratory Panel, DOES NOT test for the novel  Coronavirus (2019 nCoV)    Coronavirus HKU1 NOT DETECTED NOT DETECTED Final   Coronavirus NL63 NOT DETECTED NOT DETECTED Final   Coronavirus OC43 NOT DETECTED NOT DETECTED Final   Metapneumovirus NOT DETECTED NOT DETECTED Final   Rhinovirus / Enterovirus NOT DETECTED NOT DETECTED Final   Influenza A NOT DETECTED NOT DETECTED Final   Influenza B NOT DETECTED NOT DETECTED Final   Parainfluenza Virus 1 NOT DETECTED NOT DETECTED Final   Parainfluenza Virus 2 NOT DETECTED NOT DETECTED Final   Parainfluenza Virus 3 NOT DETECTED NOT DETECTED Final   Parainfluenza Virus 4 NOT DETECTED NOT DETECTED Final   Respiratory Syncytial Virus NOT DETECTED NOT DETECTED Final   Bordetella pertussis NOT DETECTED NOT DETECTED Final   Bordetella Parapertussis NOT DETECTED NOT DETECTED Final   Chlamydophila pneumoniae NOT DETECTED NOT DETECTED Final   Mycoplasma pneumoniae NOT DETECTED NOT DETECTED Final    Comment: Performed at [facility], [place]. [address], [place], [place] [address]  MRSA  Next Gen by PCR, Nasal     Status: None   Collection Time: 10/31/22  1:37 PM   Specimen: Nasal Mucosa; Nasal Swab  Result Value Ref Range Status   MRSA by PCR Next Gen NOT DETECTED NOT DETECTED Final    Comment: (NOTE) The GeneXpert MRSA Assay (FDA approved for NASAL specimens only), is one component of a comprehensive MRSA colonization surveillance program. It is not intended to diagnose MRSA infection nor to guide or monitor treatment for MRSA infections. Test performance is not FDA approved in patients less than 42 years old. Performed at [facility], [address] [place], [place] [address]          Radiology Studies: CT HEAD WO CONTRAST (5MM)  Result Date: 10/31/2022 CLINICAL DATA:  Delirium. EXAM: CT HEAD WITHOUT CONTRAST TECHNIQUE: Contiguous axial images were obtained from the base of the skull through the vertex without intravenous contrast. RADIATION DOSE REDUCTION: This exam was performed according to the departmental dose-optimization program which includes automated exposure control, adjustment of the mA and/or kV according to patient size and/or use of iterative reconstruction technique. COMPARISON:  None Available. FINDINGS: Brain: No acute intracranial hemorrhage. 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 No hydrocephalus or extra-axial collection. No mass or midline shift. Vascular: No hyperdense vessel or unexpected calcification. Skull: Normal. Negative for fracture or focal lesion. Sinuses/Orbits: Unremarkable. Other: None. IMPRESSION: 1. No acute intracranial hemorrhage. 2. 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 Electronically Signed   By: Emmit Alexanders M.D.   On: 10/31/2022 13:46   CT CHEST WO CONTRAST  Result Date: 10/31/2022 CLINICAL DATA:  COPD exacerbation EXAM: CT CHEST WITHOUT CONTRAST TECHNIQUE: Multidetector CT imaging of the chest was performed following the standard  protocol without IV contrast. RADIATION DOSE REDUCTION: This exam was performed according to the departmental dose-optimization program which includes automated exposure control, adjustment of the mA and/or kV according to patient size and/or use of iterative reconstruction technique. COMPARISON:  Chest CT dated March 07 2012 FINDINGS: Cardiovascular: Normal heart size. Trace pericardial effusion. Normal caliber thoracic aorta with severe calcified plaque. Severe left main and three-vessel coronary artery calcifications. Mediastinum/Nodes: Esophagus and thyroid are unremarkable. No pathologically enlarged lymph nodes seen in the chest. Lungs/Pleura: Central airways are patent. Severe centrilobular emphysema. Respiratory motion artifact somewhat limits evaluation. Numerous irregular clustered solid nodular opacities, new when compared with prior. Similar findings were also seen on 2013 prior, but less extensive. No pleural effusion or pneumothorax. Upper Abdomen: Partially visualized low-attenuation lesions of the left kidney, likely simple cysts, no specific follow-up imaging is recommended calcifications of the pancreas. Musculoskeletal: No chest wall mass or suspicious bone lesions identified. IMPRESSION: 1. Numerous irregular clustered solid nodular opacities, new when compared with prior. Findings are likely due to atypical infection. Recommend follow-up chest CT in 3 months to ensure resolution. 2. Severe left main and three-vessel coronary artery calcifications. 3. Aortic Atherosclerosis (ICD10-I70.0) and Emphysema (ICD10-J43.9). Electronically Signed   By: Yetta Glassman M.D.   On: 10/31/2022 12:30        Scheduled Meds:  atorvastatin  40 mg Oral QPM   carvedilol  12.5 mg Oral BID WC   Chlorhexidine Gluconate Cloth  6 each Topical Q0600   enoxaparin (LOVENOX) injection  40 mg Subcutaneous Q24H   feeding supplement  237 mL Oral TID   folic acid  1 mg Oral Daily   insulin aspart  0-20 Units  Subcutaneous Q4H   ipratropium-albuterol  3 mL Nebulization Q4H   lisinopril  10 mg Oral Daily   methylPREDNISolone (SOLU-MEDROL) injection  40 mg Intravenous Q12H   Continuous Infusions:  azithromycin Stopped (10/31/22 1439)   cefTRIAXone (ROCEPHIN)  IV Stopped (10/31/22 1515)     LOS: 3 days    Time spent: 37 mins    Wyvonnia Dusky, MD [facility] Pager [phone]  If 7PM-7AM, please contact night-coverage [email or web address] 11/01/2022, 9:01 AM

## 2022-11-01 NOTE — Progress Notes (Signed)
Patient continued agitated, Dr. Jimmye Norman at bedside along with intencivist, haldol and precidex ordered.

## 2022-11-01 NOTE — Consult Note (Signed)
PHARMACY CONSULT NOTE - FOLLOW UP  Pharmacy Consult for Electrolyte Monitoring and Replacement   Recent Labs: Potassium (mmol/L)  Date Value  11/01/2022 4.6  03/07/2012 4.7   Calcium (mg/dL)  Date Value  11/01/2022 9.4   Calcium, Total (mg/dL)  Date Value  03/07/2012 9.5   Albumin (g/dL)  Date Value  10/29/2022 3.2 (L)  05/17/2020 4.5  03/06/2012 3.8   Sodium (mmol/L)  Date Value  11/01/2022 147 (H)  05/17/2020 140  03/07/2012 138     Assessment: 82 yo M with PMH chronic resp failure (2 L O2 at home PRN), COPD, HTN, HLD presents with worsening SOB and increasing oxygen requirements. Presentation c/f COPDe and possible atypical/viral PNA.  Goal of Therapy:  Electrolytes WNL  Plan:  No electrolyte replacement warranted today Will recheck electrolytes with AM labs  Delena Bali ,PharmD Clinical Pharmacist 11/01/2022 7:22 AM

## 2022-11-01 NOTE — Progress Notes (Signed)
PT Cancellation Note  Patient Details Name: Gregory Haynes MRN: [id number] DOB: [birth year]   Cancelled Treatment:    Reason Eval/Treat Not Completed: Medical issues which prohibited therapy. Pt's chart reviewed. Pt transferred to ICU yesterday for BiPAP due to hypercapnia and high risk for intubation. Today pt agitated, requiring BiPAP. Haldol for his agitation. Discussed with hospitalist via secure chat to place new orders due to higher level of care when medically appropriate. PT to hold for now and will re-evaluate when appropriate.     Salem Caster. Fairly IV, PT, DPT Physical Therapist- [facility]  11/01/2022, 11:47 AM

## 2022-11-02 ENCOUNTER — Inpatient Hospital Stay: Payer: Medicare HMO

## 2022-11-02 ENCOUNTER — Other Ambulatory Visit: Payer: Self-pay

## 2022-11-02 DIAGNOSIS — I25118 Atherosclerotic heart disease of native coronary artery with other forms of angina pectoris: Secondary | ICD-10-CM | POA: Diagnosis not present

## 2022-11-02 DIAGNOSIS — R0602 Shortness of breath: Secondary | ICD-10-CM

## 2022-11-02 DIAGNOSIS — G928 Other toxic encephalopathy: Secondary | ICD-10-CM | POA: Diagnosis not present

## 2022-11-02 DIAGNOSIS — R410 Disorientation, unspecified: Secondary | ICD-10-CM

## 2022-11-02 DIAGNOSIS — J9621 Acute and chronic respiratory failure with hypoxia: Secondary | ICD-10-CM | POA: Diagnosis not present

## 2022-11-02 DIAGNOSIS — J441 Chronic obstructive pulmonary disease with (acute) exacerbation: Secondary | ICD-10-CM | POA: Diagnosis not present

## 2022-11-02 LAB — BLOOD GAS, ARTERIAL
Acid-Base Excess: 18 mmol/L — ABNORMAL HIGH (ref 0.0–2.0)
Bicarbonate: 45.1 mmol/L — ABNORMAL HIGH (ref 20.0–28.0)
Expiratory PAP: 6 cmH2O
FIO2: 40 %
Inspiratory PAP: 16 cmH2O
O2 Saturation: 99.2 %
Patient temperature: 37
pCO2 arterial: 62 mmHg — ABNORMAL HIGH (ref 32–48)
pH, Arterial: 7.47 — ABNORMAL HIGH (ref 7.35–7.45)
pO2, Arterial: 111 mmHg — ABNORMAL HIGH (ref 83–108)

## 2022-11-02 LAB — BASIC METABOLIC PANEL
Anion gap: 8 (ref 5–15)
BUN: 34 mg/dL — ABNORMAL HIGH (ref 8–23)
CO2: 37 mmol/L — ABNORMAL HIGH (ref 22–32)
Calcium: 8.6 mg/dL — ABNORMAL LOW (ref 8.9–10.3)
Chloride: 96 mmol/L — ABNORMAL LOW (ref 98–111)
Creatinine, Ser: 0.79 mg/dL (ref 0.61–1.24)
GFR, Estimated: >60 mL/min (ref >60)
Glucose, Bld: 138 mg/dL — ABNORMAL HIGH (ref 70–99)
Potassium: 4.5 mmol/L (ref 3.5–5.1)
Sodium: 141 mmol/L (ref 135–145)

## 2022-11-02 LAB — CBC
HCT: 43.4 % (ref 39.0–52.0)
Hemoglobin: 13.5 g/dL (ref 13.0–17.0)
MCH: 30.1 pg (ref 26.0–34.0)
MCHC: 31.1 g/dL (ref 30.0–36.0)
MCV: 96.7 fL (ref 80.0–100.0)
Platelets: 303 10*3/uL (ref 150–400)
RBC: 4.49 MIL/uL (ref 4.22–5.81)
RDW: 13.2 % (ref 11.5–15.5)
WBC: 11.7 10*3/uL — ABNORMAL HIGH (ref 4.0–10.5)
nRBC: 0 % (ref 0.0–0.2)

## 2022-11-02 LAB — GLUCOSE, CAPILLARY
Glucose-Capillary: 132 mg/dL — ABNORMAL HIGH (ref 70–99)
Glucose-Capillary: 142 mg/dL — ABNORMAL HIGH (ref 70–99)
Glucose-Capillary: 124 mg/dL — ABNORMAL HIGH (ref 70–99)
Glucose-Capillary: 154 mg/dL — ABNORMAL HIGH (ref 70–99)
Glucose-Capillary: 224 mg/dL — ABNORMAL HIGH (ref 70–99)

## 2022-11-02 LAB — HEMOGLOBIN A1C
Hgb A1c MFr Bld: 6.3 % — ABNORMAL HIGH (ref 4.8–5.6)
Mean Plasma Glucose: 134 mg/dL

## 2022-11-02 MED ORDER — LORAZEPAM 2 MG/ML IJ SOLN: 1.0000 mg | Freq: Once | INTRAMUSCULAR | Status: DC

## 2022-11-02 MED ORDER — HYDRALAZINE HCL 20 MG/ML IJ SOLN
10.0000 mg | INTRAMUSCULAR | Status: DC | PRN
  Administered 2022-11-03: 10 mg via INTRAVENOUS
  Filled 2022-11-02: qty 1

## 2022-11-02 MED ORDER — INSULIN ASPART 100 UNIT/ML IJ SOLN
0.0000 [IU] | Freq: Three times a day (TID) | INTRAMUSCULAR | Status: DC
  Administered 2022-11-03: 4 [IU] via SUBCUTANEOUS
  Administered 2022-11-03 (×2): 3 [IU] via SUBCUTANEOUS
  Administered 2022-11-04: 11 [IU] via SUBCUTANEOUS
  Administered 2022-11-04: 3 [IU] via SUBCUTANEOUS
  Administered 2022-11-05: 4 [IU] via SUBCUTANEOUS
  Administered 2022-11-05: 3 [IU] via SUBCUTANEOUS
  Administered 2022-11-06: 2 [IU] via SUBCUTANEOUS
  Administered 2022-11-06: 3 [IU] via SUBCUTANEOUS
  Administered 2022-11-06: 4 [IU] via SUBCUTANEOUS
  Administered 2022-11-07: 7 [IU] via SUBCUTANEOUS
  Administered 2022-11-07: 4 [IU] via SUBCUTANEOUS
  Administered 2022-11-08 (×2): 3 [IU] via SUBCUTANEOUS
  Filled 2022-11-02 (×12): qty 1

## 2022-11-02 MED ORDER — INSULIN ASPART 100 UNIT/ML IJ SOLN
0.0000 [IU] | Freq: Every day | INTRAMUSCULAR | Status: DC
  Administered 2022-11-09: 2 [IU] via SUBCUTANEOUS
  Filled 2022-11-02 (×3): qty 1

## 2022-11-02 MED ORDER — MORPHINE SULFATE (PF) 2 MG/ML IV SOLN
1.0000 mg | INTRAVENOUS | Status: DC | PRN
  Administered 2022-11-03: 1 mg via INTRAVENOUS
  Filled 2022-11-02: qty 1

## 2022-11-02 MED ORDER — DEXTROSE IN LACTATED RINGERS 5 % IV SOLN: INTRAVENOUS | Status: DC

## 2022-11-02 MED ORDER — ALPRAZOLAM 0.5 MG PO TABS
0.5000 mg | ORAL_TABLET | Freq: Once | ORAL | Status: AC
  Administered 2022-11-02: 0.5 mg via ORAL
  Filled 2022-11-02: qty 1

## 2022-11-02 NOTE — Progress Notes (Signed)
Patient continues with elevated B/P despite labetolol, Dr. Genia Harold aware.  Patient off bipap for over 30 minutes, alert and oriented x 4, and in no distress.  Bedside swallow evaluation completed with patient who passed test and po medications administered.

## 2022-11-02 NOTE — Progress Notes (Signed)
Patient complained of difficulty breathing and having tachypnea, Dr. Genia Harold notified and came to bedside and ordered to administer a breathing treatment.  Shortly after the breathing treatment patient had complaints of difficulty breathing along with tachypnea and tachycardia, Dr. Genia Harold notified and ordered to place patent back of bipap and ordered stat CXR.

## 2022-11-02 NOTE — Progress Notes (Signed)
Patient given a bath, linens and gown changed, new electrodes placed, and purwik tubing changed.  Patient tolerated bath well.  Dr. Genia Harold ordered for patient to come off of bipap and placed on 2L  and to turn off IV fluids.

## 2022-11-02 NOTE — Progress Notes (Signed)
SLP Cancellation Note  Patient Details Name: Gregory Haynes MRN: [id number] DOB: 02-07-1941   Cancelled treatment:       Reason Eval/Treat Not Completed: Other (comment) SLP followed up with medical team for readiness for bedside swallow assessment. Per discussion with RN and MD, pt cleared for diet with nursing screen, and bedside swallow assessment is no longer indicated. Please re-consult in the event of acute change/indication. Thank you.  [place] J Clapp  MS [facility] SLP   Martinique J Clapp 11/02/2022, 5:44 PM

## 2022-11-02 NOTE — Progress Notes (Signed)
SLP Cancellation Note  Patient Details Name: [name] MRN: [id number] DOB: 1940-09-24   Cancelled treatment:       Reason Eval/Treat Not Completed: Medical issues which prohibited therapy  Pt currently on BiPAP - will re- attempt as pt is able.   Rukia Mcgillivray B. Rutherford Nail, M.S., CCC-SLP, [employer] Pathologist Certified Brain Injury [place]  [facility] [phone] Ascom [phone] Fax [phone]   Stormy Fabian 11/02/2022, 12:33 PM

## 2022-11-02 NOTE — Progress Notes (Addendum)
Patient was bladder scanned at 1200 due to low urine output so far this shift.  Bladder scan volume is 163mL and Dr. Genia Harold notified. Per Dr. Genia Harold continue with IV fluids at this time.

## 2022-11-02 NOTE — Progress Notes (Signed)
Per Dr. Genia Harold, place patient on [place], remove bipap, and turn off sedation at this time.

## 2022-11-02 NOTE — Consult Note (Signed)
[place] for Electrolyte Monitoring and Replacement   Recent Labs: Potassium (mmol/L)  Date Value  11/02/2022 4.5  03/07/2012 4.7   Calcium (mg/dL)  Date Value  11/02/2022 8.6 (L)   Calcium, Total (mg/dL)  Date Value  03/07/2012 9.5   Albumin (g/dL)  Date Value  10/29/2022 3.2 (L)  05/17/2020 4.5  03/06/2012 3.8   Sodium (mmol/L)  Date Value  11/02/2022 141  05/17/2020 140  03/07/2012 138   Assessment: 82 yo M with PMH chronic resp failure (2 L O2 at home PRN), COPD, HTN, HLD presents with worsening SOB and increasing oxygen requirements. Presentation c/f COPDe and possible atypical/viral PNA.  MIVF: D5w at 50 cc/hr  Goal of Therapy:  Electrolytes within normal limits  Plan:  --Hypernatremia resolved with fluids --No electrolyte replacement warranted today --Electrolytes have been stable and not requiring replacement. Will discontinue electrolyte consult at this time. Defer further ordering of labs and electrolyte replacement to primary team --Pharmacy will continue to monitor along peripherally  Gregory Haynes 11/02/2022 7:36 AM

## 2022-11-02 NOTE — Progress Notes (Signed)
Patient with complaints of gasping for air and feeling like he can not breathe, oxygen saturation in the high 90s, HR in the 100-110s, and continues with tachypnea in the 20s.  Dr. Genia Harold notified.

## 2022-11-02 NOTE — TOC Progression Note (Signed)
Transition of Care [facility]) - Progression Note    Patient Details  Name: Gregory Haynes MRN: [id number] Date of Birth: 1940-12-06  Transition of Care [facility]) CM/SW [place], [place] Phone Number: 11/02/2022, 11:11 AM  Clinical Narrative:     CSW notes patient has no SNF bed offers. CSW has sent out referrals to all facilities listed in hub, pending bed offers at this time.   Barriers to bed offers include insurance not in network with facilities and noted agitation at times.        Expected Discharge Plan and Services                                               Social Determinants of Health (SDOH) Interventions SDOH Screenings   Food Insecurity: No Food Insecurity (10/29/2022)  Housing: Low Risk  (10/29/2022)  Transportation Needs: Unmet Transportation Needs (10/29/2022)  Utilities: Not At Risk (10/29/2022)  Alcohol Screen: Low Risk  (07/01/2022)  Depression (PHQ2-9): Low Risk  (07/01/2022)  Financial Resource Strain: Low Risk  (06/24/2018)  Physical Activity: Unknown (07/01/2019)  Social Connections: Unknown (06/24/2018)  Stress: No Stress Concern Present (07/01/2019)  Tobacco Use: Medium Risk (10/28/2022)    Readmission Risk Interventions     No data to display

## 2022-11-02 NOTE — Plan of Care (Signed)
Continuing with plan of care. 

## 2022-11-02 NOTE — Progress Notes (Signed)
NAME:  Gregory Haynes, MRN:  [id number], DOB:  Apr 21, 1941, LOS: 4 ADMISSION DATE:  10/28/2022, CHIEF COMPLAINT:  Respiratory Failure   History of Present Illness:   82 y.o male admitted with acute on chronic hypoxic & hypercapnic respiratory failure in the setting of AECOPD, suspect due to atypical/viral pneumonia, along with CO2 Narcosis requiring BiPAP.   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  He was noted to have wheezing and increased work of breathing, no known sick contacts.  The patient's wife reported that they had been on vacation to the beach and he was noncompliant with wearing his oxygen while on vacation.  He is a former smoker, quit approximately 11 years ago.  He denied chest pain, nausea, vomiting, diarrhea, hematemesis, fever, chills.   ED Course: Initial Vital Signs: Temperature 98.2 F orally, blood pressure 169/90, pulse 107, respiratory rate 22, SpO2 92% Significant Labs: Glucose 191, high-sensitivity troponin 13, WBC 12.5 COVID-19/RSV/flu PCR negative Imaging Chest X-ray>>IMPRESSION: Findings of COPD. No focal airspace consolidation.   He was admitted by the hospitalist for further workup and treatment of acute COPD exacerbation.   Please see "significant hospital events" section below for full detailed hospital course.  Pertinent  Medical History  -COPD -HTN -HLD -Psoriasis  Significant Hospital Events: Including procedures, antibiotic start and stop dates in addition to other pertinent events   3/18: Presented to ED, admitted by Hospitalist. 3/19. Patient down to his baseline 2 L of oxygen. With nursing staff it took 2 people to move  them around. Patient sleeping in the afternoon on reevaluation. As per patient's wife he has not been wearing his oxygen as he normally does on vacation and has a very poor diet over the last 4 days.  3/21: More somnolent due to CO2 Narcosis. Transfer to ICU for BiPAP, PCCM consulted.  CT chest concerning for atypical/viral pneumonia, adding on Rocephin. 3/22: Mentation improved initially, initially tolerating trial off BiPAP.  Later developed severe agitation and delirium requiring Precedex. 3/23: off bipap and oriented this morning. Short of breath during the day, back on BiPAP  Interim History / Subjective:  Awake, alert, and oriented to self and place this morning. Patient recalls being confused over the past few days.  Objective   Blood pressure (!) 147/87, pulse 87, temperature 98.3 F (36.8 C), temperature source Axillary, resp. rate (!) 23, height 5\' 11"  (1.803 m), weight 86.3 kg, SpO2 97 %.    FiO2 (%):  [28 %-40 %] 28 %   Intake/Output Summary (Last 24 hours) at 11/02/2022 1342 Last data filed at 11/02/2022 1300 Gross per 24 hour  Intake 1903.06 ml  Output 780 ml  Net 1123.06 ml   Filed Weights   10/29/22 0156  Weight: 86.3 kg    Examination: Physical Exam Vitals reviewed.  Constitutional:      General: He is not in acute distress.    Appearance: He is normal weight. He is ill-appearing.  HENT:     Head: Normocephalic.     Mouth/Throat:     Mouth: Mucous membranes are dry.  Cardiovascular:     Rate and Rhythm: Normal rate and regular rhythm.     Pulses: Normal pulses.  Heart sounds: Normal heart sounds.  Pulmonary:     Breath sounds: No wheezing.     Comments: Decreased air entry bilaterally Abdominal:     Palpations: Abdomen is soft.  Musculoskeletal:     Right lower leg: No edema.     Left lower leg: No edema.  Neurological:     General: No focal deficit present.     Mental Status: He is alert and oriented to person, place, and time.     Assessment &  Plan:   82 year old male with a history of emphysema and COPD presents to the hospital with acute hypoxic and hypercapnic respiratory failure requiring ICU admission for BiPAP  Neurology #Toxic Metabolic Encephalopathy #CO2 Narcosis #Hyperactive Delirium  In the setting of COPD exacerbation and hypoxic/hypercapnic respiratory failure. CO2 narcosis improved with BiPAP use and COPD treatment. Patient developed hyperactive delirium yesterday, requiring a dose of haloperidol and the initiation of a dexmedetomidine drip. He is improved this morning and more alert. He is oriented to self and place. Dexmedetomidine held.  -hold psychotropic medications  Cardiovascular #HTN  Continue Carvedilol for hypertension, Labetalol IV PRN while NPO  Pulmonary #Acute on Chronic Hypoxic and Hypercapnic Respiratory Failure #Acute Exacerbation of COPD  He was admitted for the management of COPD with progressively worsening mental status and arterial blood gases consistent with hypercapnia and CO2 narcosis. Patient was transferred to the ICU for the initiation of BiPAP. On lung POCUS at the bedside, there is an A line pattern with no effusions, no B-lines, and good lung sliding. CO2 improved with BIPAP, and he will require the initiation of nocturnal NIPPV moving forward. Patient's COPD exacerbation is being treated with steroids (decreased to 20 bid secondary to delirium), nebulizers, and antibiotics.  -continue duo-nebs every 4 hours -continue methylprednisone 20 mg IV bid, switch to PO prednisone once able -BiPAP qHS and with naps  Gastrointestinal  SLP evaluation today  Renal  Kidney function at baseline, started on gentle fluids given NPO status. Monitor and replete electrolytes as indicated.  -D5/LR at 50 mL/hour -holding lisinopril  Endocrine  ICU glycemic protocol  Hem/Onc  Lovenox for DVT prophylaxis  ID #COPD exacerbation  CAP coverage with Ceftriaxone and Azithromycin. Finished  azithro, today is day two of Ceftriaxone.   Best Practice (right click and "Reselect all SmartList Selections" daily)   Diet/type: NPO DVT prophylaxis: LMWH GI prophylaxis: N/A Lines: N/A Foley:  N/A Code Status:  full code Last date of multidisciplinary goals of care discussion [11/02/2022]  Labs   CBC: Recent Labs  Lab 10/28/22 1225 10/29/22 0649 10/31/22 0533 11/01/22 0614 11/02/22 0344  WBC 12.5* 10.0 14.5*  14.9* 12.7* 11.7*  NEUTROABS  --   --  11.0*  --   --   HGB 14.7 14.0 14.6  14.5 14.5 13.5  HCT 46.0 43.4 47.6  47.1 46.5 43.4  MCV 95.2 95.6 98.1  96.9 97.5 96.7  PLT 311 313 382  364 306 [id number]    Basic Metabolic Panel: Recent Labs  Lab 10/28/22 1225 10/29/22 0649 10/31/22 0533 11/01/22 0614 11/02/22 0344  NA 135 139 143 147* 141  K 4.2 4.7 4.7 4.6 4.5  CL 93* 99 98 94* 96*  CO2 31 30 35* 38* 37*  GLUCOSE 191* 201* 136* 158* 138*  BUN 22 27* 34* 29* 34*  CREATININE 0.97 0.88 0.76 0.76 0.79  CALCIUM 9.3 9.1 9.2 9.4 8.6*   GFR: Estimated Creatinine Clearance: 78.4 mL/min (by C-G formula based on SCr of  0.79 mg/dL). Recent Labs  Lab 10/29/22 0649 10/31/22 0533 11/01/22 0614 11/02/22 0344  PROCALCITON  --  0.14  --   --   WBC 10.0 14.5*  14.9* 12.7* 11.7*    Liver Function Tests: Recent Labs  Lab 10/29/22 0649  AST 49*  ALT 28  ALKPHOS 94  BILITOT 0.7  PROT 7.8  ALBUMIN 3.2*   No results for input(s): "LIPASE", "AMYLASE" in the last 168 hours. No results for input(s): "AMMONIA" in the last 168 hours.  ABG    Component Value Date/Time   PHART 7.47 (H) 11/02/2022 0439   PCO2ART 62 (H) 11/02/2022 0439   PO2ART 111 (H) 11/02/2022 0439   HCO3 45.1 (H) 11/02/2022 0439   O2SAT 99.2 11/02/2022 0439     Coagulation Profile: No results for input(s): "INR", "PROTIME" in the last 168 hours.  Cardiac Enzymes: No results for input(s): "CKTOTAL", "CKMB", "CKMBINDEX", "TROPONINI" in the last 168 hours.  HbA1C: Hgb A1c MFr Bld   Date/Time Value Ref Range Status  10/31/2022 05:33 AM 6.3 (H) 4.8 - 5.6 % Final    Comment:    (NOTE)         Prediabetes: 5.7 - 6.4         Diabetes: >6.4         Glycemic control for adults with diabetes: <7.0   05/02/2016 08:04 AM 6.3 (H) 4.8 - 5.6 % Final    Comment:             Pre-diabetes: 5.7 - 6.4          Diabetes: >6.4          Glycemic control for adults with diabetes: <7.0     CBG: Recent Labs  Lab 11/01/22 2048 11/01/22 2323 11/02/22 0335 11/02/22 0735 11/02/22 1114  GLUCAP 161* 150* 132* 142* 124*    Past Medical History:  He,  has a past medical history of Anemia, Cancer ([place]), Cataract, COPD (chronic obstructive pulmonary disease) ([place]), Helicobacter pylori ab+, Hyperlipidemia, Hypertension, Psoriasis, and Wears hearing aid in both ears.   Surgical History:   Past Surgical History:  Procedure Laterality Date   CATARACT EXTRACTION W/PHACO Left 08/18/2019   Procedure: CATARACT EXTRACTION PHACO AND INTRAOCULAR LENS PLACEMENT (IOC) LEFT 6.68,   00:54.1,   30.8%;  Surgeon: Leandrew Koyanagi, MD;  Location: [place];  Service: Ophthalmology;  Laterality: Left;   COLONOSCOPY       Social History:   reports that he quit smoking about 11 years ago. His smoking use included cigarettes. He has a 82.50 pack-year smoking history. He has never used smokeless tobacco. He reports current alcohol use. He reports that he does not use drugs.   Family History:  His family history includes Alcohol abuse in his brother; Heart attack in his father; Heart disease in his brother; Hyperlipidemia in his brother.   Allergies No Known Allergies   Home Medications  Prior to Admission medications   Medication Sig Start Date End Date Taking? Authorizing Provider  albuterol (VENTOLIN HFA) 108 (90 Base) MCG/ACT inhaler Inhale 1-2 puffs into the lungs every 4 (four) hours as needed for wheezing or shortness of breath. 07/01/22  Yes [name], PA-C  atorvastatin  (LIPITOR) 40 MG tablet Take 1 tablet (40 mg total) by mouth every evening. 05/01/22  Yes Eulas Post, MD  carvedilol (COREG) 12.5 MG tablet Take 1 tablet (12.5 mg total) by mouth 2 (two) times daily with a meal. 05/01/22  Yes Eulas Post,  MD  clobetasol cream (TEMOVATE) [id number] % Apply 1 Application topically 2 (two) times daily.   Yes [provider]  Fluticasone-Umeclidin-Vilant (TRELEGY ELLIPTA) 100-62.5-25 MCG/INH AEPB Inhale 1 puff into the lungs daily. 08/26/17  Yes [provider]  folic acid (FOLVITE) 1 MG tablet Take 1 mg by mouth daily. Unsure dose   Yes [provider]  lisinopril (ZESTRIL) 10 MG tablet Take 1 tablet (10 mg total) by mouth daily. 05/01/22  Yes Eulas Post, MD  OXYGEN Inhale 2 L/hr into the lungs. At night and as needed during day.    [provider]     Critical care time: 52 minutes    Armando Reichert, MD [facility] 11/02/2022 1:43 PM

## 2022-11-03 DIAGNOSIS — J189 Pneumonia, unspecified organism: Secondary | ICD-10-CM | POA: Diagnosis not present

## 2022-11-03 DIAGNOSIS — R531 Weakness: Secondary | ICD-10-CM | POA: Diagnosis not present

## 2022-11-03 DIAGNOSIS — R0689 Other abnormalities of breathing: Secondary | ICD-10-CM

## 2022-11-03 DIAGNOSIS — J441 Chronic obstructive pulmonary disease with (acute) exacerbation: Secondary | ICD-10-CM | POA: Diagnosis not present

## 2022-11-03 DIAGNOSIS — G928 Other toxic encephalopathy: Secondary | ICD-10-CM | POA: Diagnosis not present

## 2022-11-03 LAB — BASIC METABOLIC PANEL
Anion gap: 12 (ref 5–15)
BUN: 33 mg/dL — ABNORMAL HIGH (ref 8–23)
CO2: 36 mmol/L — ABNORMAL HIGH (ref 22–32)
Calcium: 8.7 mg/dL — ABNORMAL LOW (ref 8.9–10.3)
Chloride: 94 mmol/L — ABNORMAL LOW (ref 98–111)
Creatinine, Ser: 0.81 mg/dL (ref 0.61–1.24)
GFR, Estimated: >60 mL/min (ref >60)
Glucose, Bld: 168 mg/dL — ABNORMAL HIGH (ref 70–99)
Potassium: 4.9 mmol/L (ref 3.5–5.1)
Sodium: 142 mmol/L (ref 135–145)

## 2022-11-03 LAB — CBC
HCT: 44.5 % (ref 39.0–52.0)
Hemoglobin: 13.9 g/dL (ref 13.0–17.0)
MCH: 30 pg (ref 26.0–34.0)
MCHC: 31.2 g/dL (ref 30.0–36.0)
MCV: 96.1 fL (ref 80.0–100.0)
Platelets: 304 10*3/uL (ref 150–400)
RBC: 4.63 MIL/uL (ref 4.22–5.81)
RDW: 13.4 % (ref 11.5–15.5)
WBC: 12.5 10*3/uL — ABNORMAL HIGH (ref 4.0–10.5)
nRBC: 0 % (ref 0.0–0.2)

## 2022-11-03 LAB — GLUCOSE, CAPILLARY
Glucose-Capillary: 149 mg/dL — ABNORMAL HIGH (ref 70–99)
Glucose-Capillary: 201 mg/dL — ABNORMAL HIGH (ref 70–99)
Glucose-Capillary: 122 mg/dL — ABNORMAL HIGH (ref 70–99)
Glucose-Capillary: 156 mg/dL — ABNORMAL HIGH (ref 70–99)

## 2022-11-03 MED ORDER — LISINOPRIL 10 MG PO TABS
10.0000 mg | ORAL_TABLET | Freq: Every day | ORAL | Status: DC
  Administered 2022-11-03 – 2022-11-07 (×5): 10 mg via ORAL
  Filled 2022-11-03 (×2): qty 1
  Filled 2022-11-03 (×3): qty 2

## 2022-11-03 MED ORDER — ALPRAZOLAM 0.25 MG PO TABS
0.2500 mg | ORAL_TABLET | Freq: Two times a day (BID) | ORAL | Status: DC | PRN
  Administered 2022-11-03 – 2022-11-04 (×2): 0.25 mg via ORAL
  Filled 2022-11-03 (×2): qty 1

## 2022-11-03 MED ORDER — PREDNISONE 20 MG PO TABS
20.0000 mg | ORAL_TABLET | Freq: Every day | ORAL | Status: DC
  Administered 2022-11-03 – 2022-11-06 (×4): 20 mg via ORAL
  Filled 2022-11-03 (×3): qty 2
  Filled 2022-11-03: qty 1

## 2022-11-03 MED ORDER — IPRATROPIUM-ALBUTEROL 0.5-2.5 (3) MG/3ML IN SOLN
3.0000 mL | Freq: Three times a day (TID) | RESPIRATORY_TRACT | Status: DC
  Administered 2022-11-03 – 2022-11-06 (×10): 3 mL via RESPIRATORY_TRACT
  Filled 2022-11-03 (×9): qty 3

## 2022-11-03 NOTE — Progress Notes (Signed)
Progress Note   Patient: Gregory Haynes [id number] DOB: [birth year] DOA: 10/28/2022     5 DOS: the patient was seen and examined on 11/03/2022   Brief hospital course: ICU transfer.  82 y.o. male with medical history significant of chronic respiratory failure on 2 L, COPD, hypertension, hyperlipidemia presenting with acute on chronic respiratory failure with hypoxia and COPD exacerbation.  Patient reports increased work of breathing over the past 3 to 4 days.  Positive wheezing and increased work of breathing.  No reported sick contacts.  Wife reports that her and the patient went on vacation to the beach.  Patient was noncompliant with wearing his oxygen while on vacation.  No fevers or chills.  Quit smoking 11 years ago.  No chest pain.  No nausea or vomiting.  Has had increased O2 requirements as well as increased inhaler use at home.  Wife reports a significant event of COPD exacerbation almost 10 years ago that required and patient being transferred to [facility] because of declining respiratory status.  Presented to the ER afebrile, hemodynamically stable.  Requiring 2 to 3 L to keep O2 sats greater than 92%.  White count 12.5, hemoglobin 14.7, platelets 311.  Creatinine 0.97.  Glucose 191.  Troponin negative.,  COVID flu and RSV negative. RVP negative.  3/19.  Patient down to his baseline 2 L of oxygen.  With nursing staff it took 2 people to move them around.  Patient sleeping in the afternoon on reevaluation.  As per patient's wife he has not been wearing his oxygen as he normally does on vacation and has a very poor diet over the last 4 days. 3/21: More somnolent due to CO2 Narcosis. Transfer to ICU for BiPAP, PCCM consulted.  CT chest concerning for atypical/viral pneumonia, adding on Rocephin. 3/22: Mentation improved initially, initially tolerating trial off BiPAP.  Later developed severe agitation and delirium requiring Precedex. 3/23: off bipap and oriented this morning. Short of breath  during the day, back on BiPAP.  3/24: Patient remained little tachypneic, saturating well on 2 L of oxygen.  Labs with slight worsening of leukocytosis at 12.5, rest stable.  Weaned off from Precedex.  PT was recommending SNF but wife would like to take him back home with home health services.  Patient will also need BiPAP to use at night and while taking naps during the day.    Assessment and Plan: * Acute on chronic respiratory failure with hypoxia (HCC) Back down to his baseline 2 L of oxygen.  Required BiPAP initially. -Continue with supplemental oxygen -Continue with supportive care  COPD exacerbation ([place]) Most likely secondary to atypical pneumonia.  Patient also received Solu-Medrol. Improving.  No wheezing today -Switching Solu-Medrol with prednisone 20 mg daily for the next couple of days -Continue with bronchodilators  CAP (community acquired pneumonia) Concern of atypical infection on imaging.  Strep pneumo negative.  Legionella still pending.  Completed a course of Zithromax. Will complete ceftriaxone today  Toxic metabolic encephalopathy CO2 narcosis.  Concern of CO2 narcosis versus ICU delirium, requiring Precedex infusion.  Which has been weaned off.  Mental status now appears to be at baseline and patient appears much calmer. -Continue to monitor -Will need BiPAP at home to be used at night  Generalized weakness PT is recommending SNF.  Wife would like to take him back home with home health services which were ordered.  Coronary artery disease of native artery of native heart with stable angina pectoris (HCC) Continue Coreg and atorvastatin  HTN, goal below 130/80 Continue Coreg and lisinopril   Other hyperlipidemia Continue Lipitor   Memory loss Follow-up as outpatient.    Subjective: Patient was feeling little improved but still weak when seen today.  Wife at bedside.  Physical Exam: Vitals:   11/03/22 0739 11/03/22 0800 11/03/22 0830 11/03/22  0900  BP:  (!) 140/77 130/67 136/70  Pulse: 90 86 88 94  Resp: (!) 22 (!) 27 (!) 24 (!) 27  Temp:  98.4 F (36.9 C)    TempSrc:  Oral    SpO2: 97% 97% 95% 96%  Weight:      Height:       General.  Frail elderly man, in no acute distress. Pulmonary.  Lungs clear bilaterally, normal respiratory effort. CV.  Regular rate and rhythm, no JVD, rub or murmur. Abdomen.  Soft, nontender, nondistended, BS positive. CNS.  Alert and oriented .  No focal neurologic deficit. Extremities.  No edema, no cyanosis, pulses intact and symmetrical. Psychiatry.  Judgment and insight appears normal.   Data Reviewed: Prior data reviewed  Family Communication: Discussed with wife at bedside  Disposition: Status is: Inpatient Remains inpatient appropriate because: Severity of illness  Planned Discharge Destination: Home with Home Health  DVT prophylaxis.  Lovenox Time spent: 50 minutes  This record has been created using Systems analyst. Errors have been sought and corrected,but may not always be located. Such creation errors do not reflect on the standard of care.   Author: Lorella Nimrod, MD 11/03/2022 12:22 PM  For on call review [email or web address].

## 2022-11-03 NOTE — TOC Progression Note (Signed)
Transition of Care [facility]) - Progression Note    Patient Details  Name: Gregory Haynes MRN: [id number] Date of Birth: 1941/03/23  Transition of Care [facility]) CM/SW [place], LCSW Phone Number: 11/03/2022, 12:11 PM  Clinical Narrative:  Met with patient and daughters at bedside. Offered list of bed offers but wife said she has decided to take him home with home health. She wants to hire 24/7 private caregivers and is agreeable to [employer] referral to assist with this. No home health agency preference. Per wife and MD, patient will need bipap at discharge. Patient gets his home oxygen through [place] so made referral to their liaison.    Expected Discharge Plan and Services                                               Social Determinants of Health (SDOH) Interventions SDOH Screenings   Food Insecurity: No Food Insecurity (10/29/2022)  Housing: Low Risk  (10/29/2022)  Transportation Needs: Unmet Transportation Needs (10/29/2022)  Utilities: Not At Risk (10/29/2022)  Alcohol Screen: Low Risk  (07/01/2022)  Depression (PHQ2-9): Low Risk  (07/01/2022)  Financial Resource Strain: Low Risk  (06/24/2018)  Physical Activity: Unknown (07/01/2019)  Social Connections: Unknown (06/24/2018)  Stress: No Stress Concern Present (07/01/2019)  Tobacco Use: Medium Risk (10/28/2022)    Readmission Risk Interventions     No data to display

## 2022-11-03 NOTE — Assessment & Plan Note (Signed)
CO2 narcosis.  Concern of CO2 narcosis versus ICU delirium, requiring Precedex infusion.  Which has been weaned off.  Mental status now appears to be at baseline and patient appears much calmer. -Continue to monitor -Will need BiPAP at home to be used at night

## 2022-11-03 NOTE — Plan of Care (Signed)
Continuing with plan of care. 

## 2022-11-03 NOTE — Assessment & Plan Note (Signed)
Concern of atypical infection on imaging.  Strep pneumo negative.  Legionella still pending.  Completed a course of Zithromax. Will complete ceftriaxone today

## 2022-11-03 NOTE — Progress Notes (Signed)
Around 2300, pt became confused and began picking at equipment (EKG leads, SpO2 probe, bipap). Pt communicated that therapeutic presence and verbal comforting adequate a t helping him manage anxiety. Both provided to patient frequently throughout the evening. Less restrictive measures effective at managing anxiety negating the need for Lorazepam order.

## 2022-11-03 NOTE — Progress Notes (Signed)
Secure chat Dr Reesa Chew in reference to overnight pulse ox order for this patient.  MD verified she would like overnight test performed on room air initially, apply o2 if needed. Attempting to qualify patient for home NIV.  Patient has been on and off bipap during the day and with rest at night.  Patient was placed back on bipap this evening for distress and desaturation per staff. I relayed information to Dr Reesa Chew.  She and I both agreed best to hold off on room air overnight at this time until patient is stable and out of icu.

## 2022-11-03 NOTE — Progress Notes (Addendum)
Patient admit with Acute on Chronic hypoxic/hypercapnic respiratory failure secondary to AECOPD and CO2 narcosis improved with BiPAP use and COPD treatment. Patient developed hyperactive delirium 3/22, requiring a dose of haloperidol and the initiation of a dexmedetomidine drip. He improved on the morning of 3/23 and more alert oriented to self and place. Dexmedetomidine has been discontinued to optimized transitioning off BiPAP to only nocturnal.  He was more anxious at the beginning of shift and Xanax 0.5 mg administered at 2015. He tolerated BiPAP briefly and was noted to be confused, anxious and trying to take off the mask. Additional low dose Lorazepam ordered which has not been administered. Given CO2 Narcosis now with anticipatory anxiety which will required nocturnal BiPAP, pt will likely benefit from low dose anxiolytics. He has no hx of Dementia at baseline and although his age and recent ICU delirium are risk factors for paradoxical worsening of agitation with benzo use, he is at high risk for worsening hypercapnic respiratory failure secondary to CO2 narcosis requiring intubation if he does not use BiPAP.   Rufina Falco, DNP, CCRN, FNP-C, AGACNP-BC Acute Care & Family Nurse Practitioner  [facility]  See Amion for personal pager PCCM on call pager [phone] until 7 am

## 2022-11-03 NOTE — Evaluation (Signed)
Physical Therapy Re-Evaluation Patient Details Name: Gregory Haynes MRN: [id number] DOB: 06-Mar-1941 Today's Date: 11/03/2022  History of Present Illness  Pt is an 63 y 22 M admitted on 10/28/22 after presenting to the ED with c/o progressive SOB over 3-4 days. Pt admitted for tx of COPD exacerbation. Pt transferred to ICU on 10/31/22 for BiPAP & chest CT concerning for atypical/viral PNA. PMH: chronic hypoxic respiratory failure on 2L O2 PRN, COPD, HTN, HLD  Clinical Impression  Pt seen for PT re-evaluation with pt agreeable, wife present for session. On this date, pt requires min assist for bed mobility, min<>mod assist for STS with cuing for technique & increasing assistance from lower surface height. Pt is able to progress to taking a few steps forwards + backwards with RW & min assist with seated rest break between 2/2 fatigue.  Pt is motivated to participate & has a very supportive wife. PT goals updated. Recommend ongoing PT services to address deficits noted below & facilitate return to PLOF.   Recommendations for follow up therapy are one component of a multi-disciplinary discharge planning process, led by the attending physician.  Recommendations may be updated based on patient status, additional functional criteria and insurance authorization.  Follow Up Recommendations Acute inpatient rehab (3hours/day) Can patient physically be transported by private vehicle: No    Assistance Recommended at Discharge Frequent or constant Supervision/Assistance  Patient can return home with the following  A lot of help with walking and/or transfers;A lot of help with bathing/dressing/bathroom;Assist for transportation;Direct supervision/assist for financial management;Assistance with cooking/housework;Direct supervision/assist for medications management;Help with stairs or ramp for entrance    Equipment Recommendations  (TBD in next venue)  Recommendations for Other Services       Functional Status  Assessment Patient has had a recent decline in their functional status and demonstrates the ability to make significant improvements in function in a reasonable and predictable amount of time.     Precautions / Restrictions Precautions Precautions: Fall Restrictions Weight Bearing Restrictions: No      Mobility  Bed Mobility Overal bed mobility: Needs Assistance Bed Mobility: Supine to Sit     Supine to sit: Min assist, HOB elevated     General bed mobility comments: extra time, holds to PTs hands    Transfers Overall transfer level: Needs assistance Equipment used: Rolling walker (2 wheels) Transfers: Sit to/from Stand, Bed to chair/wheelchair/BSC Sit to Stand: Mod assist, Min assist (min assist STS from elevated EOB, mod assist STS from recliner, education/cuing re: pushing to stand vs BUE on RW)   Step pivot transfers: Min assist (bed>recliner with min assist, cuing to square up to recliner & reach back for armrests during stand>sit.)            Ambulation/Gait Ambulation/Gait assistance: Min assist Gait Distance (Feet):  (4 ft forwards + 4 ft backwards x 2) Assistive device: Rolling walker (2 wheels) Gait Pattern/deviations: Decreased step length - left, Decreased step length - right, Decreased stride length, Decreased dorsiflexion - right, Decreased dorsiflexion - left Gait velocity: decreased        Stairs            Wheelchair Mobility    Modified Rankin (Stroke Patients Only)       Balance Overall balance assessment: Needs assistance Sitting-balance support: Bilateral upper extremity supported, Feet supported Sitting balance-Leahy Scale: Fair     Standing balance support: Bilateral upper extremity supported, During functional activity, Reliant on assistive device for balance Standing balance-Leahy Scale: Fair  Pertinent Vitals/Pain Pain Assessment Pain Assessment: No/denies pain    Home Living  Family/patient expects to be discharged to:: Private residence Living Arrangements: Spouse/significant other Available Help at Discharge: Family;Available 24 hours/day Type of Home: House Home Access: Stairs to enter Entrance Stairs-Rails: Left Entrance Stairs-Number of Steps: 4   Home Layout: One level Home Equipment: Rollator (4 wheels) Additional Comments: home O2    Prior Function Prior Level of Function : Independent/Modified Independent             Mobility Comments: sometimes uses rollator for transporting his home O2       Hand Dominance        Extremity/Trunk Assessment   Upper Extremity Assessment Upper Extremity Assessment: Generalized weakness    Lower Extremity Assessment Lower Extremity Assessment: Generalized weakness (3-/5 knee extension BLE)       Communication   Communication: HOH (hearing aides not charged)  Cognition Arousal/Alertness: Awake/alert Behavior During Therapy: WFL for tasks assessed/performed Overall Cognitive Status: Within Functional Limits for tasks assessed                                 General Comments: Follows instructions throughout session.        General Comments General comments (skin integrity, edema, etc.): Pt on 2L/min via nasal cannula with lowest SpO2 of 87% but pt without c/o SOB & able to recover, wife providing cuing for pursed lip breathing    Exercises Other Exercises Other Exercises: Pt performed standing marches with cuing for BLE foot clearance, 5-6 reps on each LE with BUE support on RW.   Assessment/Plan    PT Assessment Patient needs continued PT services  PT Problem List Decreased strength;Decreased coordination;Cardiopulmonary status limiting activity;Decreased activity tolerance;Decreased knowledge of use of DME;Decreased safety awareness;Decreased balance;Decreased mobility;Decreased knowledge of precautions;Decreased range of motion       PT Treatment Interventions DME  instruction;Therapeutic exercise;Gait training;Balance training;Stair training;Neuromuscular re-education;Therapeutic activities;Patient/family education;Functional mobility training    PT Goals (Current goals can be found in the Care Plan section)  Acute Rehab PT Goals Patient Stated Goal: get better PT Goal Formulation: With patient Time For Goal Achievement: 11/17/22 Potential to Achieve Goals: Good    Frequency 7X/week     Co-evaluation               AM-PAC PT "6 Clicks" Mobility  Outcome Measure Help needed turning from your back to your side while in a flat bed without using bedrails?: A Little Help needed moving from lying on your back to sitting on the side of a flat bed without using bedrails?: A Lot Help needed moving to and from a bed to a chair (including a wheelchair)?: A Little Help needed standing up from a chair using your arms (e.g., wheelchair or bedside chair)?: A Lot Help needed to walk in hospital room?: A Lot Help needed climbing 3-5 steps with a railing? : Total 6 Click Score: 13    End of Session Equipment Utilized During Treatment: Oxygen Activity Tolerance: Patient tolerated treatment well Patient left: in chair;with call bell/phone within reach;with family/visitor present   PT Visit Diagnosis: Unsteadiness on feet (R26.81);Muscle weakness (generalized) (M62.81);Difficulty in walking, not elsewhere classified (R26.2)    Time: [id number] PT Time Calculation (min) (ACUTE ONLY): 28 min   Charges:   PT Evaluation $PT Re-evaluation: 1 Re-eval PT Treatments $Therapeutic Activity: 8-22 mins        Lavone Nian, PT, DPT  11/03/22, 1:46 PM   Waunita Schooner 11/03/2022, 1:43 PM

## 2022-11-04 ENCOUNTER — Inpatient Hospital Stay: Payer: Medicare HMO

## 2022-11-04 DIAGNOSIS — G928 Other toxic encephalopathy: Secondary | ICD-10-CM | POA: Diagnosis not present

## 2022-11-04 DIAGNOSIS — R531 Weakness: Secondary | ICD-10-CM | POA: Diagnosis not present

## 2022-11-04 DIAGNOSIS — J441 Chronic obstructive pulmonary disease with (acute) exacerbation: Secondary | ICD-10-CM | POA: Diagnosis not present

## 2022-11-04 DIAGNOSIS — J189 Pneumonia, unspecified organism: Secondary | ICD-10-CM | POA: Diagnosis not present

## 2022-11-04 LAB — LEGIONELLA PNEUMOPHILA SEROGP 1 UR AG: L. pneumophila Serogp 1 Ur Ag: NEGATIVE

## 2022-11-04 LAB — BASIC METABOLIC PANEL
Anion gap: 9 (ref 5–15)
BUN: 38 mg/dL — ABNORMAL HIGH (ref 8–23)
CO2: 38 mmol/L — ABNORMAL HIGH (ref 22–32)
Calcium: 8.9 mg/dL (ref 8.9–10.3)
Chloride: 94 mmol/L — ABNORMAL LOW (ref 98–111)
Creatinine, Ser: 0.8 mg/dL (ref 0.61–1.24)
GFR, Estimated: >60 mL/min (ref >60)
Glucose, Bld: 134 mg/dL — ABNORMAL HIGH (ref 70–99)
Potassium: 4.7 mmol/L (ref 3.5–5.1)
Sodium: 141 mmol/L (ref 135–145)

## 2022-11-04 LAB — CBC
HCT: 45.7 % (ref 39.0–52.0)
Hemoglobin: 14.2 g/dL (ref 13.0–17.0)
MCH: 30 pg (ref 26.0–34.0)
MCHC: 31.1 g/dL (ref 30.0–36.0)
MCV: 96.6 fL (ref 80.0–100.0)
Platelets: 297 10*3/uL (ref 150–400)
RBC: 4.73 MIL/uL (ref 4.22–5.81)
RDW: 13.6 % (ref 11.5–15.5)
WBC: 11 10*3/uL — ABNORMAL HIGH (ref 4.0–10.5)
nRBC: 0 % (ref 0.0–0.2)

## 2022-11-04 LAB — GLUCOSE, CAPILLARY
Glucose-Capillary: 114 mg/dL — ABNORMAL HIGH (ref 70–99)
Glucose-Capillary: 288 mg/dL — ABNORMAL HIGH (ref 70–99)
Glucose-Capillary: 135 mg/dL — ABNORMAL HIGH (ref 70–99)
Glucose-Capillary: 173 mg/dL — ABNORMAL HIGH (ref 70–99)

## 2022-11-04 MED ORDER — POLYETHYLENE GLYCOL 3350 17 G PO PACK
17.0000 g | PACK | Freq: Every day | ORAL | Status: DC
  Administered 2022-11-04 – 2022-11-11 (×5): 17 g via ORAL
  Filled 2022-11-04 (×5): qty 1

## 2022-11-04 MED ORDER — SODIUM CHLORIDE 0.9 % IV SOLN: INTRAVENOUS | Status: DC | PRN

## 2022-11-04 NOTE — Assessment & Plan Note (Signed)
Most likely secondary to atypical pneumonia.  Patient also received Solu-Medrol. Improving.  No wheezing today -Solu-Medrol was switched with prednisone 20 mg daily-for 3 more days -Continue with bronchodilators

## 2022-11-04 NOTE — Assessment & Plan Note (Signed)
Back down to his baseline 2 L of oxygen.  Required BiPAP initially. Patient is requiring BiPAP as needed for intermittent dyspnea and desaturation -Continue with supplemental oxygen -Continue with supportive care

## 2022-11-04 NOTE — Progress Notes (Addendum)
Progress Note   Patient: Gregory Haynes [id number] DOB: 19-Dec-1940 DOA: 10/28/2022     6 DOS: the patient was seen and examined on 11/04/2022   Brief hospital course: ICU transfer.  82 y.o. male with medical history significant of chronic respiratory failure on 2 L, COPD, hypertension, hyperlipidemia presenting with acute on chronic respiratory failure with hypoxia and COPD exacerbation.  Patient reports increased work of breathing over the past 3 to 4 days.  Positive wheezing and increased work of breathing.  No reported sick contacts.  Wife reports that her and the patient went on vacation to the beach.  Patient was noncompliant with wearing his oxygen while on vacation.  No fevers or chills.  Quit smoking 11 years ago.  No chest pain.  No nausea or vomiting.  Has had increased O2 requirements as well as increased inhaler use at home.  Wife reports a significant event of COPD exacerbation almost 10 years ago that required and patient being transferred to [facility] because of declining respiratory status.  Presented to the ER afebrile, hemodynamically stable.  Requiring 2 to 3 L to keep O2 sats greater than 92%.  White count 12.5, hemoglobin 14.7, platelets 311.  Creatinine 0.97.  Glucose 191.  Troponin negative.,  COVID flu and RSV negative. RVP negative.  3/19.  Patient down to his baseline 2 L of oxygen.  With nursing staff it took 2 people to move them around.  Patient sleeping in the afternoon on reevaluation.  As per patient's wife he has not been wearing his oxygen as he normally does on vacation and has a very poor diet over the last 4 days. 3/21: More somnolent due to CO2 Narcosis. Transfer to ICU for BiPAP, PCCM consulted.  CT chest concerning for atypical/viral pneumonia, adding on Rocephin. 3/22: Mentation improved initially, initially tolerating trial off BiPAP.  Later developed severe agitation and delirium requiring Precedex. 3/23: off bipap and oriented this morning. Short of breath  during the day, back on BiPAP.  3/24: Patient remained little tachypneic, saturating well on 2 L of oxygen.  Labs with slight worsening of leukocytosis at 12.5, rest stable.  Weaned off from Precedex.  PT was recommending SNF but wife would like to take him back home with home health services.  Patient will also need BiPAP to use at night and while taking naps during the day.  3/25: Patient stable this morning.  Unable to do overnight pulse ox as he desaturated and was placed on BiPAP yesterday evening.  Most likely candidal today.  Wife now agrees for CIR-pending evaluation and insurance authorization.  Repeat chest x-ray today with severe emphysematous changes and no evidence of lobar consolidation.  Pt has Chronic respiratory Failure second to COPD. Needs non-invasive ventilator to reduce hospitalizations and sustain life.    Assessment and Plan: * Acute on chronic respiratory failure with hypoxia (HCC) Back down to his baseline 2 L of oxygen.  Required BiPAP initially. Patient is requiring BiPAP as needed for intermittent dyspnea and desaturation -Continue with supplemental oxygen -Continue with supportive care  COPD exacerbation ([place]) Most likely secondary to atypical pneumonia.  Patient also received Solu-Medrol. Improving.  No wheezing today -Solu-Medrol was switched with prednisone 20 mg daily-for 3 more days -Continue with bronchodilators  CAP (community acquired pneumonia) Concern of atypical infection on imaging.  Strep pneumo negative.  Legionella still pending.  Completed a course of antibiotics. Repeat chest x-ray today with severe emphysematous changes, no consolidation  Toxic metabolic encephalopathy CO2 narcosis.  Concern of CO2 narcosis versus ICU delirium, requiring Precedex infusion.  Which has been weaned off.  Mental status now appears to be at baseline and patient appears much calmer. -Continue to monitor -Will need BiPAP at home to be used at night  Generalized  weakness PT is recommending SNF.  Wife would like to take him back home with home health services which were ordered.  Coronary artery disease of native artery of native heart with stable angina pectoris (HCC) Continue Coreg and atorvastatin  HTN, goal below 130/80 Continue Coreg and lisinopril   Other hyperlipidemia Continue Lipitor   Memory loss Follow-up as outpatient.    Subjective: Patient was sitting comfortably in chair and chatting with multiple family members in the room.  No sign of distress or any complaints.  Wife wants him to go to CIR before returning home now.  Physical Exam: Vitals:   11/04/22 1100 11/04/22 1200 11/04/22 1300 11/04/22 1400  BP: 120/64 125/71 (!) 109/56 126/78  Pulse: (!) 105 96 79 78  Resp: 20 (!) 24 (!) 22 (!) 26  Temp: 98.9 F (37.2 C)     TempSrc: Oral     SpO2: 93% 97% 96% 96%  Weight:      Height:       General.  Frail elderly man, in no acute distress. Pulmonary.  Lungs clear bilaterally, normal respiratory effort. CV.  Regular rate and rhythm, no JVD, rub or murmur. Abdomen.  Soft, nontender, nondistended, BS positive. CNS.  Alert and oriented .  No focal neurologic deficit. Extremities.  No edema, no cyanosis, pulses intact and symmetrical. Psychiatry.  Judgment and insight appears normal.    Data Reviewed: Prior data reviewed  Family Communication: Discussed with wife at bedside  Disposition: Status is: Inpatient Remains inpatient appropriate because: Severity of illness  Planned Discharge Destination: Home with Home Health  DVT prophylaxis.  Lovenox Time spent: 45 minutes  This record has been created using Systems analyst. Errors have been sought and corrected,but may not always be located. Such creation errors do not reflect on the standard of care.   Author: Lorella Nimrod, MD 11/04/2022 2:31 PM  For on call review [email or web address].

## 2022-11-04 NOTE — Progress Notes (Signed)
Physical Therapy Treatment Patient Details Name: Gregory Haynes MRN: [id number] DOB: Nov 05, 1940 Today's Date: 11/04/2022   History of Present Illness Pt is an 16 y 49 M admitted on 10/28/22 after presenting to the ED with c/o progressive SOB over 3-4 days. Pt admitted for tx of COPD exacerbation. Pt transferred to ICU on 10/31/22 for BiPAP & chest CT concerning for atypical/viral PNA. PMH: chronic hypoxic respiratory failure on 2L O2 PRN, COPD, HTN, HLD    PT Comments    Pt seen for PT tx with pt agreeable, family present in room, & pt's wife eager for pt to walk. Pt requires CGA<>Min assist for bed mobility, min<>mod assist for STS with RW & ongoing cuing re: hand placement. Pt is able to ambulate 3 bouts with RW & min assist with chair follow for safety. Pt is making good, consistent progress & would benefit from ongoing therapy services but today's session limited by transport tech arriving to take pt to x-ray.   Recommendations for follow up therapy are one component of a multi-disciplinary discharge planning process, led by the attending physician.  Recommendations may be updated based on patient status, additional functional criteria and insurance authorization.  Follow Up Recommendations  Can patient physically be transported by private vehicle: No    Assistance Recommended at Discharge Frequent or constant Supervision/Assistance  Patient can return home with the following A lot of help with walking and/or transfers;A lot of help with bathing/dressing/bathroom;Assist for transportation;Direct supervision/assist for financial management;Assistance with cooking/housework;Direct supervision/assist for medications management;Help with stairs or ramp for entrance   Equipment Recommendations  None recommended by PT (TBD in next venue)    Recommendations for Other Services       Precautions / Restrictions Precautions Precautions: Fall Restrictions Weight Bearing Restrictions: No      Mobility  Bed Mobility Overal bed mobility: Needs Assistance Bed Mobility: Supine to Sit     Supine to sit: Min assist, HOB elevated Sit to supine: Min guard    General bed mobility comments: Pt with improving ability to move BLE towards EOB, still reaches for PT's hand to fully upright trunk to sitting.    Transfers Overall transfer level: Needs assistance Equipment used: Rolling walker (2 wheels) Transfers: Sit to/from Stand Sit to Stand: Mod assist, Min assist           General transfer comment: STS from [place] with ongoing cuing for hand placement, extra time to power up to standing.    Ambulation/Gait Ambulation/Gait assistance: Min assist, +2 safety/equipment (daughter provided chair follow for safety) Gait Distance (Feet): 3 Feet (+ 15 ft + 17 ft) Assistive device: Rolling walker (2 wheels) Gait Pattern/deviations: Decreased step length - left, Decreased step length - right, Decreased stride length, Decreased dorsiflexion - right, Decreased dorsiflexion - left Gait velocity: decreased     General Gait Details: PT provides education re: need to not push RW too far out in front, pt requires 1 standing rest break during 2nd ambulation bout, slight BLE hip/knee & trunk flexion during standing/gait with PT providing cuing for upright posture.   Stairs             Wheelchair Mobility    Modified Rankin (Stroke Patients Only)       Balance Overall balance assessment: Needs assistance Sitting-balance support: Bilateral upper extremity supported, Feet supported       Standing balance support: Bilateral upper extremity supported, During functional activity, Reliant on assistive device for balance Standing balance-Leahy Scale: Fair  Cognition Arousal/Alertness: Awake/alert Behavior During Therapy: WFL for tasks assessed/performed Overall Cognitive Status: Within Functional Limits for tasks assessed                                  General Comments: Follows simple commands with extra time during session.        Exercises      General Comments General comments (skin integrity, edema, etc.): Pt on 2L/min via nasal cannul at rest, increased to 3L/min with gait. Unable to obtain accurate SpO2 reading with good pleth waveform with pt denying SOB (at times SpO2 reading as low as 75% but question accuracy of this as pt not in distress), SpO2 >/= 88% at rest. Max HR 122 bpm.      Pertinent Vitals/Pain Pain Assessment Pain Assessment: No/denies pain    Home Living Family/patient expects to be discharged to:: Private residence Living Arrangements: Spouse/significant other Available Help at Discharge: Family;Available 24 hours/day Type of Home: House Home Access: Stairs to enter Entrance Stairs-Rails: Left Entrance Stairs-Number of Steps: 4   Home Layout: One level Home Equipment: Rollator (4 wheels) Additional Comments: home O2    Prior Function            PT Goals (current goals can now be found in the care plan section) Acute Rehab PT Goals Patient Stated Goal: get better PT Goal Formulation: With patient Time For Goal Achievement: 11/17/22 Potential to Achieve Goals: Good Progress towards PT goals: Progressing toward goals    Frequency    7X/week      PT Plan Current plan remains appropriate    Co-evaluation              AM-PAC PT "6 Clicks" Mobility   Outcome Measure  Help needed turning from your back to your side while in a flat bed without using bedrails?: A Little Help needed moving from lying on your back to sitting on the side of a flat bed without using bedrails?: A Lot Help needed moving to and from a bed to a chair (including a wheelchair)?: A Little Help needed standing up from a chair using your arms (e.g., wheelchair or bedside chair)?: A Lot Help needed to walk in hospital room?: A Lot Help needed climbing 3-5 steps with a railing?  : Total 6 Click Score: 13    End of Session Equipment Utilized During Treatment: Oxygen Activity Tolerance: Patient tolerated treatment well (limited 2/2 transport tech arriving to take pt to x-ray) Patient left: in bed;with family/visitor present (in care of transport tech) Nurse Communication: Mobility status (O2) PT Visit Diagnosis: Unsteadiness on feet (R26.81);Muscle weakness (generalized) (M62.81);Difficulty in walking, not elsewhere classified (R26.2);Other abnormalities of gait and mobility (R26.89)     Time: [id number] PT Time Calculation (min) (ACUTE ONLY): 18 min  Charges:  $Therapeutic Activity: 8-22 mins                     Lavone Nian, PT, DPT 11/04/22, 1:35 PM   Waunita Schooner 11/04/2022, 1:33 PM

## 2022-11-04 NOTE — Progress Notes (Signed)
Inpatient Rehab Admissions Coordinator:   Per therapy recommendations pt was screened for CIR by Shann Medal, PT, DPT.  Pt could be a potential candidate.  Note TOC documentation reporting spouse prefers to take patient home.  If family would like to consider [facility], please place an IP Rehab MD consult and we will assess.   Shann Medal, PT, DPT Admissions Coordinator [phone] 11/04/22  10:37 AM

## 2022-11-04 NOTE — Progress Notes (Signed)
PULMONOLOGY         Date: 11/04/2022,   MRN# [id number] Gregory Haynes 10-23-1940     AdmissionWeight: 86.3 kg                 CurrentWeight: 86.3 kg  Referring provider: Dr Jimmye Norman   CHIEF COMPLAINT:   Acute on chronic hypoxemic respiratory failure   HISTORY OF PRESENT ILLNESS   Gregory Haynes is a 82 y.o. male with medical history significant of chronic respiratory failure on 2 L, COPD, hypertension, hyperlipidemia presenting with acute on chronic respiratory failure with hypoxia and COPD exacerbation. Family is here and able to help me with details of history as patient is somnolent.  Patient is poorly responsive somnolent. No reported sick contacts.  Wife reports that her and the patient went on vacation to the beach he ate a large meal and started getting worse after this. .  Patient was noncompliant with wearing his oxygen while on vacation.  No fevers or chills.  Quit smoking 11 years ago.  No chest pain.  No nausea or vomiting.  Has had increased O2 requirements as well as increased inhaler use at home.  Wife reports ICU admission with endotracheal intubation 10 yrs ago. Presented to the ER afebrile, hemodynamically stable.  Requiring 2 to 3 L to keep O2 sats greater than 92%.  White count 12.5, hemoglobin 14.7, platelets 311.  Creatinine 0.97.  Glucose 191.  Troponin negative x 1.  COVID flu and RSV negative. CT chest reviewed by me shows acute bronchitis on background of severe emphysema. ABG with severe hypercarbia and hypoxemia. Pulmonary consultation for further evaluation and management.   11/04/22- patient is no longer encephalopathic.  His voice is hoarse but mentation is clear AAOx2.  He is ating now. He has PT coming up.  He is being optimized for dc from step down  PAST MEDICAL HISTORY   Past Medical History:  Diagnosis Date   Anemia    Cancer ([place])    BCC   Cataract    COPD (chronic obstructive pulmonary disease) ([place])    Helicobacter pylori ab+     Hyperlipidemia    Hypertension    Psoriasis    Wears hearing aid in both ears      SURGICAL HISTORY   Past Surgical History:  Procedure Laterality Date   CATARACT EXTRACTION W/PHACO Left 08/18/2019   Procedure: CATARACT EXTRACTION PHACO AND INTRAOCULAR LENS PLACEMENT (IOC) LEFT 6.68,   00:54.1,   30.8%;  Surgeon: Leandrew Koyanagi, MD;  Location: [facility];  Service: Ophthalmology;  Laterality: Left;   COLONOSCOPY       FAMILY HISTORY   Family History  Problem Relation Age of Onset   Heart attack Father    Heart disease Brother    Hyperlipidemia Brother    Alcohol abuse Brother      SOCIAL HISTORY   Social History   Tobacco Use   Smoking status: Former    Packs/day: 1.50    Years: 55.00    Additional pack years: 0.00    Total pack years: 82.50    Types: Cigarettes    Quit date: 08/13/2011    Years since quitting: 11.2   Smokeless tobacco: Never  Vaping Use   Vaping Use: Never used  Substance Use Topics   Alcohol use: Yes    Comment: occasionally   Drug use: No     MEDICATIONS    Home Medication:    Current Medication:  Current Facility-Administered Medications:    albuterol (PROVENTIL) (2.5 MG/3ML) 0.083% nebulizer solution 2.5 mg, 2.5 mg, Nebulization, Q2H PRN, Dgayli, Khabib, MD, 2.5 mg at 11/02/22 1132   ALPRAZolam (XANAX) tablet 0.25 mg, 0.25 mg, Oral, BID PRN, Lang Snow, NP, 0.25 mg at 11/03/22 2032   atorvastatin (LIPITOR) tablet 40 mg, 40 mg, Oral, QPM, Deneise Lever, MD, 40 mg at 11/03/22 1637   carvedilol (COREG) tablet 12.5 mg, 12.5 mg, Oral, BID WC, Deneise Lever, MD, 12.5 mg at 11/03/22 1637   cefTRIAXone (ROCEPHIN) 1 g in sodium chloride 0.9 % 100 mL IVPB, 1 g, Intravenous, Q24H, Dgayli, Khabib, MD, Last Rate: 200 mL/hr at 11/03/22 1425, 1 g at 11/03/22 1425   Chlorhexidine Gluconate Cloth 2 % PADS 6 each, 6 each, Topical, Q0600, Wyvonnia Dusky, MD, 6 each at 11/04/22 0605   enoxaparin (LOVENOX) injection  40 mg, 40 mg, Subcutaneous, Q24H, Belue, Alver Sorrow, RPH, 40 mg at 11/03/22 0756   feeding supplement (ENSURE ENLIVE / ENSURE PLUS) liquid 237 mL, 237 mL, Oral, TID, Wyvonnia Dusky, MD, 237 mL at [id number]   folic acid (FOLVITE) tablet 1 mg, 1 mg, Oral, Daily, Wieting, Richard, MD, 1 mg at 11/03/22 0800   hydrALAZINE (APRESOLINE) injection 10 mg, 10 mg, Intravenous, Q4H PRN, Genia Harold, Khabib, MD, 10 mg at 11/03/22 0656   insulin aspart (novoLOG) injection 0-20 Units, 0-20 Units, Subcutaneous, TID WC, Ouma, Bing Neighbors, NP, 3 Units at 11/03/22 1637   insulin aspart (novoLOG) injection 0-5 Units, 0-5 Units, Subcutaneous, QHS, Ouma, Bing Neighbors, NP   ipratropium-albuterol (DUONEB) 0.5-2.5 (3) MG/3ML nebulizer solution 3 mL, 3 mL, Nebulization, TID, Dgayli, Khabib, MD, 3 mL at 11/04/22 0748   labetalol (NORMODYNE) injection 5-10 mg, 5-10 mg, Intravenous, Q2H PRN, Genia Harold, Khabib, MD, 10 mg at 11/03/22 0751   lisinopril (ZESTRIL) tablet 10 mg, 10 mg, Oral, Daily, Lorella Nimrod, MD, 10 mg at 11/03/22 1255   LORazepam (ATIVAN) injection 1 mg, 1 mg, Intravenous, Once, Ouma, Bing Neighbors, NP   morphine (PF) 2 MG/ML injection 1 mg, 1 mg, Intravenous, Q2H PRN, Lang Snow, NP, 1 mg at 11/03/22 2208   ondansetron (ZOFRAN) tablet 4 mg, 4 mg, Oral, Q6H PRN **OR** ondansetron (ZOFRAN) injection 4 mg, 4 mg, Intravenous, Q6H PRN, Deneise Lever, MD   predniSONE (DELTASONE) tablet 20 mg, 20 mg, Oral, Q breakfast, Lorella Nimrod, MD, 20 mg at 11/03/22 1255    ALLERGIES   Patient has no known allergies.     REVIEW OF SYSTEMS    Review of Systems:  Gen:  Denies  fever, sweats, chills weigh loss  HEENT: Denies blurred vision, double vision, ear pain, eye pain, hearing loss, nose bleeds, sore throat Cardiac:  No dizziness, chest pain or heaviness, chest tightness,edema Resp:   reports dyspnea chronically  Gi: Denies swallowing difficulty, stomach pain, nausea or vomiting,  diarrhea, constipation, bowel incontinence Gu:  Denies bladder incontinence, burning urine Ext:   Denies Joint pain, stiffness or swelling Skin: Denies  skin rash, easy bruising or bleeding or hives Endoc:  Denies polyuria, polydipsia , polyphagia or weight change Psych:   Denies depression, insomnia or hallucinations   Other:  All other systems negative   VS: BP 132/66   Pulse (!) 56   Temp (!) 97.2 F (36.2 C) (Axillary)   Resp 19   Ht 5\' 11"  (1.803 m)   Wt 86.3 kg   SpO2 96%   BMI 26.54 kg/m  PHYSICAL EXAM    GENERAL:NAD, no fevers, chills, no weakness no fatigue HEAD: Normocephalic, atraumatic.  EYES: Pupils equal, round, reactive to light. Extraocular muscles intact. No scleral icterus.  MOUTH: Moist mucosal membrane. Dentition intact. No abscess noted.  EAR, NOSE, THROAT: Clear without exudates. No external lesions.  NECK: Supple. No thyromegaly. No nodules. No JVD.  PULMONARY: decreased breath sounds with mild rhonchi worse at bases bilaterally.  CARDIOVASCULAR: S1 and S2. Regular rate and rhythm. No murmurs, rubs, or gallops. No edema. Pedal pulses 2+ bilaterally.  GASTROINTESTINAL: Soft, nontender, nondistended. No masses. Positive bowel sounds. No hepatosplenomegaly.  MUSCULOSKELETAL: No swelling, clubbing, or edema. Range of motion full in all extremities.  NEUROLOGIC: Cranial nerves II through XII are intact. No gross focal neurological deficits. Sensation intact. Reflexes intact.  SKIN: No ulceration, lesions, rashes, or cyanosis. Skin warm and dry. Turgor intact.  PSYCHIATRIC: Mood, affect within normal limits. The patient is awake, alert and oriented x 3. Insight, judgment intact.       IMAGING     ASSESSMENT/PLAN   Acute exacerbation of COPD    -Patient with hypercarbic encephalopathy currently somnolent.    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  -optimized for transfer out of SDU        Thank you for allowing me to participate in the care of this patient.   Patient/Family are satisfied with care plan and all questions have been answered.    Provider disclosure: Patient with at least one acute or chronic illness or injury that poses a threat to life or bodily function and is being managed actively during this encounter.  All of the below services have been performed independently by signing provider:  review of prior documentation from internal and or external health records.  Review of previous and current lab results.  Interview and comprehensive assessment during patient visit today. Review of current and previous chest radiographs/CT scans. Discussion of management and test interpretation with health care team and patient/family.   This document was prepared using Dragon voice recognition software and may include unintentional dictation errors.     Ottie Glazier, M.D.  Division of Pulmonary & Critical Care Medicine

## 2022-11-04 NOTE — TOC Progression Note (Signed)
Transition of Care [facility]) - Progression Note    Patient Details  Name: Gregory Haynes MRN: [id number] Date of Birth: 12-Oct-1940  Transition of [facility]) CM/SW [place], [place] Phone Number: 11/04/2022, 10:47 AM  Clinical Narrative:     CSW spoke with patient's spouse Gregory Haynes and patient at bedside.  Informed of updated PT recs for CIR. Gregory Haynes reports she would consider it as she is coming to understand the extensive needs patient will need at home in addition to private care agency along with [place].   CSW has reached out to CIR to inform them of Gregory Haynes considering CIR at this time and is interested in more information.        Expected Discharge Plan and Services                                               Social Determinants of Health (SDOH) Interventions SDOH Screenings   Food Insecurity: No Food Insecurity (10/29/2022)  Housing: Low Risk  (10/29/2022)  Transportation Needs: Unmet Transportation Needs (10/29/2022)  Utilities: Not At Risk (10/29/2022)  Alcohol Screen: Low Risk  (07/01/2022)  Depression (PHQ2-9): Low Risk  (07/01/2022)  Financial Resource Strain: Low Risk  (06/24/2018)  Physical Activity: Unknown (07/01/2019)  Social Connections: Unknown (06/24/2018)  Stress: No Stress Concern Present (07/01/2019)  Tobacco Use: Medium Risk (10/28/2022)    Readmission Risk Interventions     No data to display

## 2022-11-04 NOTE — Assessment & Plan Note (Signed)
CO2 narcosis.  Concern of CO2 narcosis versus ICU delirium, requiring Precedex infusion.  Which has been weaned off.  Mental status now appears to be at baseline and patient appears much calmer. -Continue to monitor -Will need BiPAP at home to be used at night

## 2022-11-04 NOTE — Plan of Care (Signed)
Pt off bipap this am and tolerated O2 at [facility] today with sats mid 90's. OOB x 2 today into chair and also ambulated with PT, easily fatigued. Eating 10-25% but likes ensure. Family at bedside most of day. Wife agrees pt looks a little better today. Pt alert, oriented x 3, but forgetful. Voids using external catheter. Last BM documented 4 days ago; will address bowel regimen with provider.   Problem: Education: Goal: Knowledge of disease or condition will improve Outcome: Progressing Goal: Knowledge of the prescribed therapeutic regimen will improve Outcome: Progressing Goal: Individualized Educational Video(s) Outcome: Progressing   Problem: Activity: Goal: Ability to tolerate increased activity will improve Outcome: Progressing Goal: Will verbalize the importance of balancing activity with adequate rest periods Outcome: Progressing   Problem: Respiratory: Goal: Ability to maintain a clear airway will improve Outcome: Progressing Goal: Levels of oxygenation will improve Outcome: Progressing Goal: Ability to maintain adequate ventilation will improve Outcome: Progressing   Problem: Education: Goal: Knowledge of General Education information will improve Description: Including pain rating scale, medication(s)/side effects and non-pharmacologic comfort measures Outcome: Progressing   Problem: Health Behavior/Discharge Planning: Goal: Ability to manage health-related needs will improve Outcome: Progressing   Problem: Clinical Measurements: Goal: Ability to maintain clinical measurements within normal limits will improve Outcome: Progressing Goal: Will remain free from infection Outcome: Progressing Goal: Diagnostic test results will improve Outcome: Progressing Goal: Respiratory complications will improve Outcome: Progressing Goal: Cardiovascular complication will be avoided Outcome: Progressing   Problem: Activity: Goal: Risk for activity intolerance will decrease Outcome:  Progressing   Problem: Nutrition: Goal: Adequate nutrition will be maintained Outcome: Progressing   Problem: Coping: Goal: Level of anxiety will decrease Outcome: Progressing   Problem: Elimination: Goal: Will not experience complications related to bowel motility Outcome: Progressing Goal: Will not experience complications related to urinary retention Outcome: Progressing   Problem: Pain Managment: Goal: General experience of comfort will improve Outcome: Progressing   Problem: Safety: Goal: Ability to remain free from injury will improve Outcome: Progressing   Problem: Skin Integrity: Goal: Risk for impaired skin integrity will decrease Outcome: Progressing   Problem: Education: Goal: Ability to describe self-care measures that may prevent or decrease complications (Diabetes Survival Skills Education) will improve Outcome: Progressing Goal: Individualized Educational Video(s) Outcome: Progressing   Problem: Coping: Goal: Ability to adjust to condition or change in health will improve Outcome: Progressing   Problem: Fluid Volume: Goal: Ability to maintain a balanced intake and output will improve Outcome: Progressing   Problem: Health Behavior/Discharge Planning: Goal: Ability to identify and utilize available resources and services will improve Outcome: Progressing Goal: Ability to manage health-related needs will improve Outcome: Progressing   Problem: Metabolic: Goal: Ability to maintain appropriate glucose levels will improve Outcome: Progressing   Problem: Nutritional: Goal: Maintenance of adequate nutrition will improve Outcome: Progressing Goal: Progress toward achieving an optimal weight will improve Outcome: Progressing   Problem: Skin Integrity: Goal: Risk for impaired skin integrity will decrease Outcome: Progressing   Problem: Tissue Perfusion: Goal: Adequacy of tissue perfusion will improve Outcome: Progressing

## 2022-11-04 NOTE — Evaluation (Signed)
Occupational Therapy Evaluation Patient Details Name: Gregory Haynes MRN: [id number] DOB: 1940-08-26 Today's Date: 11/04/2022   History of Present Illness Pt is an 89 y 55 M admitted on 10/28/22 after presenting to the ED with c/o progressive SOB over 3-4 days. Pt admitted for tx of COPD exacerbation. Pt transferred to ICU on 10/31/22 for BiPAP & chest CT concerning for atypical/viral PNA. PMH: chronic hypoxic respiratory failure on 2L O2 PRN, COPD, HTN, HLD   Clinical Impression   Patient agreeable to OT evaluation. Spouse present. Pt presenting with decreased independence in self care, balance, functional mobility/transfers, and endurance. PTA pt lived with spouse, was independent in ADLs/IADLs, and functional mobility with occasional use of rollator. Pt currently functioning at [place] A for bed mobility, Min-Mod A for simulated toilet transfer, Min A for functional mobility to bedside recliner using a RW, and set up-supervision for seated grooming tasks. Anticipate increased assistance for standing ADL tasks (at least Min A). Pt will benefit from acute OT to increase overall independence in the areas of ADLs and functional mobility in order to safely discharge to next venue of care. OT recommends ongoing therapy upon discharge to maximize safety and independence with ADLs, decrease fall risk, decrease caregiver burden, and promote return to PLOF.     Recommendations for follow up therapy are one component of a multi-disciplinary discharge planning process, led by the attending physician.  Recommendations may be updated based on patient status, additional functional criteria and insurance authorization.   Assistance Recommended at Discharge Frequent or constant Supervision/Assistance  Patient can return home with the following A lot of help with walking and/or transfers;A lot of help with bathing/dressing/bathroom;Direct supervision/assist for medications management;Direct supervision/assist for  financial management;Assistance with cooking/housework;Assist for transportation;Help with stairs or ramp for entrance    Functional Status Assessment  Patient has had a recent decline in their functional status and demonstrates the ability to make significant improvements in function in a reasonable and predictable amount of time.  Equipment Recommendations  BSC/3in1;Tub/shower seat    Recommendations for Other Services       Precautions / Restrictions Precautions Precautions: Fall Restrictions Weight Bearing Restrictions: No      Mobility Bed Mobility Overal bed mobility: Needs Assistance Bed Mobility: Supine to Sit     Supine to sit: Min assist, HOB elevated     General bed mobility comments: assist for trunk elevation    Transfers Overall transfer level: Needs assistance Equipment used: Rolling walker (2 wheels) Transfers: Sit to/from Stand Sit to Stand: Min assist, Mod assist (Min-Mod A to stand from normal bed height)           General transfer comment: VC for hand placement      Balance Overall balance assessment: Needs assistance Sitting-balance support: Bilateral upper extremity supported, Feet supported Sitting balance-Leahy Scale: Fair     Standing balance support: Bilateral upper extremity supported, During functional activity, Reliant on assistive device for balance Standing balance-Leahy Scale: Fair                             ADL either performed or assessed with clinical judgement   ADL Overall ADL's : Needs assistance/impaired     Grooming: Set up;Sitting;Wash/dry face;Brushing hair                   Toilet Transfer: Minimal assistance;Moderate assistance;Rolling walker (2 wheels) Toilet Transfer Details (indicate cue type and reason): simulated Toileting- Clothing Manipulation  and Hygiene: Sit to/from stand;Minimal assistance Toileting - Clothing Manipulation Details (indicate cue type and reason): anticipate      Functional mobility during ADLs: Minimal assistance;Rolling walker (2 wheels) (~85ft from EOB>recliner)       Vision Baseline Vision/History: 1 Wears glasses Patient Visual Report: No change from baseline       Perception     Praxis      Pertinent Vitals/Pain Pain Assessment Pain Assessment: No/denies pain     Hand Dominance     Extremity/Trunk Assessment Upper Extremity Assessment Upper Extremity Assessment: Generalized weakness   Lower Extremity Assessment Lower Extremity Assessment: Generalized weakness   Cervical / Trunk Assessment Cervical / Trunk Assessment: Normal   Communication Communication Communication: HOH (hearing aides present)   Cognition Arousal/Alertness: Awake/alert Behavior During Therapy: WFL for tasks assessed/performed Overall Cognitive Status: Within Functional Limits for tasks assessed                                 General Comments: A&Ox4     General Comments  Pt on 2L O2 via [place] t/o. Lowest SpO2 in 70s likely due to poor pleth. Improved quickly with Min VC for pursed lip breathing and standing rest break.    Exercises Other Exercises Other Exercises: OT provided education re: role of OT, OT POC, post acute recs, sitting up for all meals, EOB/OOB mobility with assistance, home/fall safety.     Shoulder Instructions      Home Living Family/patient expects to be discharged to:: Private residence Living Arrangements: Spouse/significant other Available Help at Discharge: Family;Available 24 hours/day Type of Home: House Home Access: Stairs to enter [employer] of Steps: 4 Entrance Stairs-Rails: Left Home Layout: One level     Bathroom Shower/Tub: Teacher, early years/pre: Standard Bathroom Accessibility: Yes   Home Equipment: Rollator (4 wheels)   Additional Comments: home O2      Prior Functioning/Environment Prior Level of Function : Independent/Modified Independent;Driving              Mobility Comments: sometimes uses rollator for transporting his home O2 ADLs Comments: Independent ADLs/IADLs        OT Problem List: Decreased strength;Decreased activity tolerance;Impaired balance (sitting and/or standing);Cardiopulmonary status limiting activity;Decreased safety awareness;Decreased knowledge of use of DME or AE;Decreased knowledge of precautions      OT Treatment/Interventions: Self-care/ADL training;Therapeutic exercise;Neuromuscular education;Energy conservation;DME and/or AE instruction;Manual therapy;Modalities;Balance training;Patient/family education;Visual/perceptual remediation/compensation;Cognitive remediation/compensation;Therapeutic activities;Splinting    OT Goals(Current goals can be found in the care plan section) Acute Rehab OT Goals Patient Stated Goal: return to PLOF OT Goal Formulation: With patient/family Time For Goal Achievement: 11/18/22 Potential to Achieve Goals: Good   OT Frequency: Min 3X/week    Co-evaluation              AM-PAC OT "6 Clicks" Daily Activity     Outcome Measure Help from another person eating meals?: None Help from another person taking care of personal grooming?: A Little Help from another person toileting, which includes using toliet, bedpan, or urinal?: A Lot Help from another person bathing (including washing, rinsing, drying)?: A Lot Help from another person to put on and taking off regular upper body clothing?: A Little Help from another person to put on and taking off regular lower body clothing?: A Lot 6 Click Score: 16   End of Session Equipment Utilized During Treatment: Gait belt;Rolling walker (2 wheels);Oxygen Nurse Communication: Mobility status  Activity Tolerance: Patient tolerated treatment well Patient left: in chair;with call bell/phone within reach;with chair alarm set;with family/visitor present  OT Visit Diagnosis: Unsteadiness on feet (R26.81);Muscle weakness (generalized) (M62.81)                 Time: [id number] OT Time Calculation (min): 36 min Charges:  OT General Charges $OT Visit: 1 Visit OT Evaluation $OT Eval Moderate Complexity: 1 Mod  [facility], OTR/L ascom [phone]  11/04/22, 1:10 PM

## 2022-11-04 NOTE — Assessment & Plan Note (Signed)
Concern of atypical infection on imaging.  Strep pneumo negative.  Legionella still pending.  Completed a course of antibiotics. Repeat chest x-ray today with severe emphysematous changes, no consolidation

## 2022-11-05 DIAGNOSIS — G928 Other toxic encephalopathy: Secondary | ICD-10-CM | POA: Diagnosis not present

## 2022-11-05 DIAGNOSIS — J189 Pneumonia, unspecified organism: Secondary | ICD-10-CM | POA: Diagnosis not present

## 2022-11-05 DIAGNOSIS — R531 Weakness: Secondary | ICD-10-CM | POA: Diagnosis not present

## 2022-11-05 DIAGNOSIS — J441 Chronic obstructive pulmonary disease with (acute) exacerbation: Secondary | ICD-10-CM | POA: Diagnosis not present

## 2022-11-05 LAB — GLUCOSE, CAPILLARY
Glucose-Capillary: 129 mg/dL — ABNORMAL HIGH (ref 70–99)
Glucose-Capillary: 159 mg/dL — ABNORMAL HIGH (ref 70–99)
Glucose-Capillary: 91 mg/dL (ref 70–99)
Glucose-Capillary: 181 mg/dL — ABNORMAL HIGH (ref 70–99)
Glucose-Capillary: 164 mg/dL — ABNORMAL HIGH (ref 70–99)

## 2022-11-05 MED ORDER — SENNOSIDES-DOCUSATE SODIUM 8.6-50 MG PO TABS
2.0000 | ORAL_TABLET | Freq: Two times a day (BID) | ORAL | Status: DC | PRN
  Administered 2022-11-05: 2 via ORAL
  Filled 2022-11-05: qty 2

## 2022-11-05 NOTE — Progress Notes (Signed)
Inpatient Rehab Admissions Coordinator:   I spoke with pt. And wife over the phone regarding potential CIR admit. They are interested and wife states she can provide 24/7 assist and may hire some help. I sent case to insurance today.   Clemens Catholic, [place], [place] Admissions Coordinator  [phone] ([place]) [phone] (office)

## 2022-11-05 NOTE — Progress Notes (Signed)
Report called to Quita Skye, care nurse to receive pt on 2A.

## 2022-11-05 NOTE — Progress Notes (Signed)
PULMONOLOGY         Date: 11/05/2022,   MRN# [id number] Gregory Haynes 1941-07-01     AdmissionWeight: 86.3 kg                 CurrentWeight: 86.3 kg  Referring provider: Dr Jimmye Norman   CHIEF COMPLAINT:   Acute on chronic hypoxemic respiratory failure   HISTORY OF PRESENT ILLNESS   Gregory Haynes is a 82 y.o. male with medical history significant of chronic respiratory failure on 2 L, COPD, hypertension, hyperlipidemia presenting with acute on chronic respiratory failure with hypoxia and COPD exacerbation. Family is here and able to help me with details of history as patient is somnolent.  Patient is poorly responsive somnolent. No reported sick contacts.  Wife reports that her and the patient went on vacation to the beach he ate a large meal and started getting worse after this. .  Patient was noncompliant with wearing his oxygen while on vacation.  No fevers or chills.  Quit smoking 11 years ago.  No chest pain.  No nausea or vomiting.  Has had increased O2 requirements as well as increased inhaler use at home.  Wife reports ICU admission with endotracheal intubation 10 yrs ago. Presented to the ER afebrile, hemodynamically stable.  Requiring 2 to 3 L to keep O2 sats greater than 92%.  White count 12.5, hemoglobin 14.7, platelets 311.  Creatinine 0.97.  Glucose 191.  Troponin negative x 1.  COVID flu and RSV negative. CT chest reviewed by me shows acute bronchitis on background of severe emphysema. ABG with severe hypercarbia and hypoxemia. Pulmonary consultation for further evaluation and management.   11/04/22- patient is no longer encephalopathic.  His voice is hoarse but mentation is clear AAOx2.  He is ating now. He has PT coming up.  He is being optimized for dc from step down  11/05/22- patient seen and examined at bedside. Gregory Haynes is at bedside, patient is ambulatory and is now with PT.  He still has urinary catheter.  He is on prednisone 20mg  and MDI inhalers  with Breo and Incruse ellipta.  Patient is being optimized for SNF to rehab some more.    PAST MEDICAL HISTORY   Past Medical History:  Diagnosis Date  . Anemia   . Cancer ([place])    [place]  . Cataract   . COPD (chronic obstructive pulmonary disease) ([place])   . Helicobacter pylori ab+   . Hyperlipidemia   . Hypertension   . Psoriasis   . Wears hearing aid in both ears      SURGICAL HISTORY   Past Surgical History:  Procedure Laterality Date  . CATARACT EXTRACTION W/PHACO Left 08/18/2019   Procedure: CATARACT EXTRACTION PHACO AND INTRAOCULAR LENS PLACEMENT (IOC) LEFT 6.68,   00:54.1,   30.8%;  Surgeon: Leandrew Koyanagi, MD;  Location: [facility];  Service: Ophthalmology;  Laterality: Left;  . COLONOSCOPY       FAMILY HISTORY   Family History  Problem Relation Age of Onset  . Heart attack Father   . Heart disease Brother   . Hyperlipidemia Brother   . Alcohol abuse Brother      SOCIAL HISTORY   Social History   Tobacco Use  . Smoking status: Former    Packs/day: 1.50    Years: 55.00    Additional pack years: 0.00    Total pack years: 82.50    Types: Cigarettes    Quit date: 08/13/2011  Years since quitting: 11.2  . Smokeless tobacco: Never  Vaping Use  . Vaping Use: Never used  Substance Use Topics  . Alcohol use: Yes    Comment: occasionally  . Drug use: No     MEDICATIONS    Home Medication:    Current Medication:  Current Facility-Administered Medications:  .  0.9 %  sodium chloride infusion, , Intravenous, PRN, Lorella Nimrod, MD, Last Rate: 10 mL/hr at 11/04/22 1600, Infusion Verify at 11/04/22 1600 .  albuterol (PROVENTIL) (2.5 MG/3ML) 0.083% nebulizer solution 2.5 mg, 2.5 mg, Nebulization, Q2H PRN, Genia Harold, Khabib, MD, 2.5 mg at 11/02/22 1132 .  ALPRAZolam Duanne Moron) tablet 0.25 mg, 0.25 mg, Oral, BID PRN, Lang Snow, NP, 0.25 mg at 11/04/22 2100 .  atorvastatin (LIPITOR) tablet 40 mg, 40 mg, Oral, QPM, Deneise Lever, MD,  40 mg at 11/04/22 1737 .  carvedilol (COREG) tablet 12.5 mg, 12.5 mg, Oral, BID WC, Deneise Lever, MD, 12.5 mg at 11/05/22 0900 .  Chlorhexidine Gluconate Cloth 2 % PADS 6 each, 6 each, Topical, Q0600, Wyvonnia Dusky, MD, 6 each at 11/04/22 [phone] .  enoxaparin (LOVENOX) injection 40 mg, 40 mg, Subcutaneous, Q24H, Belue, Alver Sorrow, RPH, 40 mg at 11/05/22 0900 .  feeding supplement (ENSURE ENLIVE / ENSURE PLUS) liquid 237 mL, 237 mL, Oral, TID, Wyvonnia Dusky, MD, 237 mL at [id number] .  folic acid (FOLVITE) tablet 1 mg, 1 mg, Oral, Daily, Leslye Peer, Richard, MD, 1 mg at 11/05/22 0901 .  hydrALAZINE (APRESOLINE) injection 10 mg, 10 mg, Intravenous, Q4H PRN, Armando Reichert, MD, 10 mg at 11/03/22 0656 .  insulin aspart (novoLOG) injection 0-20 Units, 0-20 Units, Subcutaneous, TID WC, Lang Snow, NP, 3 Units at 11/05/22 [phone] .  insulin aspart (novoLOG) injection 0-5 Units, 0-5 Units, Subcutaneous, QHS, [place], Bing Neighbors, NP .  ipratropium-albuterol (DUONEB) 0.5-2.5 (3) MG/3ML nebulizer solution 3 mL, 3 mL, Nebulization, TID, Dgayli, Khabib, MD, 3 mL at 11/05/22 0759 .  labetalol (NORMODYNE) injection 5-10 mg, 5-10 mg, Intravenous, Q2H PRN, Armando Reichert, MD, 10 mg at 11/03/22 0751 .  lisinopril (ZESTRIL) tablet 10 mg, 10 mg, Oral, Daily, Lorella Nimrod, MD, 10 mg at 11/05/22 0901 .  LORazepam (ATIVAN) injection 1 mg, 1 mg, Intravenous, Once, [place], Bing Neighbors, NP .  morphine (PF) 2 MG/ML injection 1 mg, 1 mg, Intravenous, Q2H PRN, Lang Snow, NP, 1 mg at 11/03/22 2208 .  ondansetron (ZOFRAN) tablet 4 mg, 4 mg, Oral, Q6H PRN **OR** ondansetron (ZOFRAN) injection 4 mg, 4 mg, Intravenous, Q6H PRN, Deneise Lever, MD .  polyethylene glycol (MIRALAX / GLYCOLAX) packet 17 g, 17 g, Oral, Daily, Lorella Nimrod, MD, 17 g at 11/04/22 2100 .  predniSONE (DELTASONE) tablet 20 mg, 20 mg, Oral, Q breakfast, Lorella Nimrod, MD, 20 mg at 11/05/22 0902 .  senna-docusate  (Senokot-S) tablet 2 tablet, 2 tablet, Oral, BID PRN, Pearla Dubonnet, RPH, 2 tablet at 11/05/22 1027    ALLERGIES   Patient has no known allergies.     REVIEW OF SYSTEMS    Review of Systems:  Gen:  Denies  fever, sweats, chills weigh loss  HEENT: Denies blurred vision, double vision, ear pain, eye pain, hearing loss, nose bleeds, sore throat Cardiac:  No dizziness, chest pain or heaviness, chest tightness,edema Resp:   reports dyspnea chronically  Gi: Denies swallowing difficulty, stomach pain, nausea or vomiting, diarrhea, constipation, bowel incontinence Gu:  Denies bladder incontinence, burning urine Ext:   Denies Joint  pain, stiffness or swelling Skin: Denies  skin rash, easy bruising or bleeding or hives Endoc:  Denies polyuria, polydipsia , polyphagia or weight change Psych:   Denies depression, insomnia or hallucinations   Other:  All other systems negative   VS: BP 129/72 (BP Location: Left Arm)   Pulse 77   Temp 98.2 F (36.8 C)   Resp 16   Ht 5\' 11"  (1.803 m)   Wt 86.3 kg   SpO2 94%   BMI 26.54 kg/m      PHYSICAL EXAM    GENERAL:NAD, no fevers, chills, no weakness no fatigue HEAD: Normocephalic, atraumatic.  EYES: Pupils equal, round, reactive to light. Extraocular muscles intact. No scleral icterus.  MOUTH: Moist mucosal membrane. Dentition intact. No abscess noted.  EAR, NOSE, THROAT: Clear without exudates. No external lesions.  NECK: Supple. No thyromegaly. No nodules. No JVD.  PULMONARY: decreased breath sounds with mild rhonchi worse at bases bilaterally.  CARDIOVASCULAR: S1 and S2. Regular rate and rhythm. No murmurs, rubs, or gallops. No edema. Pedal pulses 2+ bilaterally.  GASTROINTESTINAL: Soft, nontender, nondistended. No masses. Positive bowel sounds. No hepatosplenomegaly.  MUSCULOSKELETAL: No swelling, clubbing, or edema. Range of motion full in all extremities.  NEUROLOGIC: Cranial nerves II through XII are intact. No gross focal  neurological deficits. Sensation intact. Reflexes intact.  SKIN: No ulceration, lesions, rashes, or cyanosis. Skin warm and dry. Turgor intact.  PSYCHIATRIC: Mood, affect within normal limits. The patient is awake, alert and oriented x 3. Insight, judgment intact.       IMAGING     ASSESSMENT/PLAN   Acute exacerbation of COPD    -Patient with hypercarbic encephalopathy currently somnolent.    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  -optimized for dc to SNF per physical therapy   - continue PAP on outpatient due to AECOPD with hypercapnic encephalopathy.        Thank you for allowing me to participate in the care of this patient.   Patient/Family are satisfied with care plan and all questions have been answered.    Provider disclosure: Patient with at least one acute or chronic illness or injury that poses a threat to life or bodily function and is being managed actively during this encounter.  All of the below services have been performed independently by signing provider:  review of prior documentation from internal and or external health records.  Review of previous and current lab results.  Interview and comprehensive assessment during patient visit today. Review of current and previous chest radiographs/CT scans. Discussion of management and test interpretation with health care team and patient/family.   This document was prepared using Dragon voice recognition software and may include unintentional dictation errors.     Ottie Glazier, M.D.  Division of Pulmonary & Critical Care Medicine

## 2022-11-05 NOTE — Progress Notes (Signed)
Pt transported via bed to room 256, accompanied by RN and NT, on telemetry monitoring. Wife at bedfside. Handoff given at bedside RN to RN.

## 2022-11-05 NOTE — Progress Notes (Signed)
Physical Therapy Treatment Patient Details Name: Gregory Haynes MRN: [id number] DOB: 16-Mar-1941 Today's Date: 11/05/2022   History of Present Illness Pt is an 20 y 5 M admitted on 10/28/22 after presenting to the ED with c/o progressive SOB over 3-4 days. Pt admitted for tx of COPD exacerbation. Pt transferred to ICU on 10/31/22 for BiPAP & chest CT concerning for atypical/viral PNA. PMH: chronic hypoxic respiratory failure on 2L O2 PRN, COPD, HTN, HLD    PT Comments    Pt seen for PT tx with pt agreeable. Pt is making good progress with functional mobility as he was able to ambulate 50 ft + 50 ft with RW, min assist & w/c follow, although pt does require standing & sitting rest breaks to complete. Pt engaged in 5x STS for strengthening. Pt remains an ideal candidate for >3 hours of daily therapy and would benefit from ongoing PT services to address deficits noted below to reduce fall risk & facilitate return to PLOF.    Recommendations for follow up therapy are one component of a multi-disciplinary discharge planning process, led by the attending physician.  Recommendations may be updated based on patient status, additional functional criteria and insurance authorization.  Follow Up Recommendations  Can patient physically be transported by private vehicle: No    Assistance Recommended at Discharge Frequent or constant Supervision/Assistance  Patient can return home with the following A lot of help with walking and/or transfers;A lot of help with bathing/dressing/bathroom;Assist for transportation;Direct supervision/assist for financial management;Assistance with cooking/housework;Direct supervision/assist for medications management;Help with stairs or ramp for entrance   Equipment Recommendations  None recommended by PT    Recommendations for Other Services       Precautions / Restrictions Precautions Precautions: Fall Restrictions Weight Bearing Restrictions: No     Mobility  Bed  Mobility               General bed mobility comments: Not tested, pt received & left sitting in recliner    Transfers Overall transfer level: Needs assistance Equipment used: Rolling walker (2 wheels) Transfers: Sit to/from Stand Sit to Stand: Mod assist, Min assist (Mod fade to min assist with extra time to power up to standing & transition hands from chair armrests to RW. Pt requires min cuing for safe hand placement/to reach back prior to stand>sit.)                Ambulation/Gait Ambulation/Gait assistance: Min assist, +2 safety/equipment (chair follow for safety/rest breaks) Gait Distance (Feet): 50 Feet (+ 50 ft) Assistive device: Rolling walker (2 wheels) Gait Pattern/deviations: Decreased step length - left, Decreased step length - right, Decreased stride length, Decreased dorsiflexion - right, Decreased dorsiflexion - left Gait velocity: decreased     General Gait Details: Slightly decreased heel strike. During first bout of 50 ft pt requires multiple standing rest breaks 2/2 fatigue, during 2nd 50 ft bout pt is able to complete with only 1 standing rest break. Pt does require seated rest break between each gait trial.   Stairs             Wheelchair Mobility    Modified Rankin (Stroke Patients Only)       Balance Overall balance assessment: Needs assistance Sitting-balance support: Bilateral upper extremity supported, Feet supported Sitting balance-Leahy Scale: Fair     Standing balance support: Bilateral upper extremity supported, During functional activity, Reliant on assistive device for balance Standing balance-Leahy Scale: Fair  Cognition Arousal/Alertness: Awake/alert Behavior During Therapy: WFL for tasks assessed/performed Overall Cognitive Status: Within Functional Limits for tasks assessed                                 General Comments: Very sweet, eager to participate,  appreciative of PT efforts. Follows simple commands with slightly extra time throughout session.        Exercises Other Exercises Other Exercises: Pt performs 5x STS with BUE support with min assist with focus on hand placement, endurance training, & strengthening. Pt able to perform 3 reps before requiring seated rest break prior to completing last 2.    General Comments General comments (skin integrity, edema, etc.): Pt on 2L/min at rest, increased to 3L/min with activity. Pt doing well at pursed lip breathing, does endorse some slight SOB at times. Difficult to obtain correct SpO2 reading 2/2 poor pleth waveform.      Pertinent Vitals/Pain Pain Assessment Pain Assessment: No/denies pain    Home Living                          Prior Function            PT Goals (current goals can now be found in the care plan section) Acute Rehab PT Goals Patient Stated Goal: get better PT Goal Formulation: With patient Time For Goal Achievement: 11/17/22 Potential to Achieve Goals: Good Progress towards PT goals: Progressing toward goals    Frequency    7X/week      PT Plan Current plan remains appropriate    Co-evaluation              AM-PAC PT "6 Clicks" Mobility   Outcome Measure  Help needed turning from your back to your side while in a flat bed without using bedrails?: A Little Help needed moving from lying on your back to sitting on the side of a flat bed without using bedrails?: A Little Help needed moving to and from a bed to a chair (including a wheelchair)?: A Little Help needed standing up from a chair using your arms (e.g., wheelchair or bedside chair)?: A Little Help needed to walk in hospital room?: A Little Help needed climbing 3-5 steps with a railing? : Total 6 Click Score: 16    End of Session Equipment Utilized During Treatment: Oxygen Activity Tolerance: Patient tolerated treatment well Patient left: in chair Nurse Communication:  Mobility status PT Visit Diagnosis: Unsteadiness on feet (R26.81);Muscle weakness (generalized) (M62.81);Difficulty in walking, not elsewhere classified (R26.2);Other abnormalities of gait and mobility (R26.89)     Time: [id number] PT Time Calculation (min) (ACUTE ONLY): 25 min  Charges:  $Therapeutic Activity: 23-37 mins                     Lavone Nian, PT, DPT 11/05/22, 10:37 AM  Waunita Schooner 11/05/2022, 10:36 AM

## 2022-11-05 NOTE — Progress Notes (Signed)
Progress Note   Patient: Gregory Haynes [id number] DOB: 1940/12/04 DOA: 10/28/2022     7 DOS: the patient was seen and examined on 11/05/2022   Brief hospital course: ICU transfer.  82 y.o. male with medical history significant of chronic respiratory failure on 2 L, COPD, hypertension, hyperlipidemia presenting with acute on chronic respiratory failure with hypoxia and COPD exacerbation.  Patient reports increased work of breathing over the past 3 to 4 days.  Positive wheezing and increased work of breathing.  No reported sick contacts.  Wife reports that her and the patient went on vacation to the beach.  Patient was noncompliant with wearing his oxygen while on vacation.  No fevers or chills.  Quit smoking 11 years ago.  No chest pain.  No nausea or vomiting.  Has had increased O2 requirements as well as increased inhaler use at home.  Wife reports a significant event of COPD exacerbation almost 10 years ago that required and patient being transferred to [facility] because of declining respiratory status.  Presented to the ER afebrile, hemodynamically stable.  Requiring 2 to 3 L to keep O2 sats greater than 92%.  White count 12.5, hemoglobin 14.7, platelets 311.  Creatinine 0.97.  Glucose 191.  Troponin negative.,  COVID flu and RSV negative. RVP negative.  3/19.  Patient down to his baseline 2 L of oxygen.  With nursing staff it took 2 people to move them around.  Patient sleeping in the afternoon on reevaluation.  As per patient's wife he has not been wearing his oxygen as he normally does on vacation and has a very poor diet over the last 4 days. 3/21: More somnolent due to CO2 Narcosis. Transfer to ICU for BiPAP, PCCM consulted.  CT chest concerning for atypical/viral pneumonia, adding on Rocephin. 3/22: Mentation improved initially, initially tolerating trial off BiPAP.  Later developed severe agitation and delirium requiring Precedex. 3/23: off bipap and oriented this morning. Short of breath  during the day, back on BiPAP.  3/24: Patient remained little tachypneic, saturating well on 2 L of oxygen.  Labs with slight worsening of leukocytosis at 12.5, rest stable.  Weaned off from Precedex.  PT was recommending SNF but wife would like to take him back home with home health services.  Patient will also need BiPAP to use at night and while taking naps during the day.  3/25: Patient stable this morning.  Unable to do overnight pulse ox as he desaturated and was placed on BiPAP yesterday evening.  Most likely candidal today.  Wife now agrees for CIR-pending evaluation and insurance authorization.  Repeat chest x-ray today with severe emphysematous changes and no evidence of lobar consolidation.  Pt has Chronic respiratory Failure second to COPD. Needs non-invasive ventilator to reduce hospitalizations and sustain life.   3/26: Hemodynamically stable.  NIV ordered.  Pending CIR evaluation   Assessment and Plan: * Acute on chronic respiratory failure with hypoxia (HCC) Back down to his baseline 2 L of oxygen.  Required BiPAP initially. Patient is requiring BiPAP as needed for intermittent dyspnea and desaturation -Continue with supplemental oxygen -Continue with supportive care  COPD exacerbation ([place]) Most likely secondary to atypical pneumonia.  Patient also received Solu-Medrol. Improving.  No wheezing today -Solu-Medrol was switched with prednisone 20 mg daily-for 3 more days -Continue with bronchodilators  CAP (community acquired pneumonia) Concern of atypical infection on imaging.  Strep pneumo negative.  Legionella still pending.  Completed a course of antibiotics. Repeat chest x-ray today with severe  emphysematous changes, no consolidation  Toxic metabolic encephalopathy CO2 narcosis.  Concern of CO2 narcosis versus ICU delirium, requiring Precedex infusion.  Which has been weaned off.  Mental status now appears to be at baseline and patient appears much calmer. -Continue  to monitor -Will need BiPAP at home to be used at night  Generalized weakness PT is recommending SNF.  Wife would like to take him back home with home health services which were ordered.  Coronary artery disease of native artery of native heart with stable angina pectoris (HCC) Continue Coreg and atorvastatin  HTN, goal below 130/80 Continue Coreg and lisinopril   Other hyperlipidemia Continue Lipitor   Memory loss Follow-up as outpatient.    Subjective: Patient was seen and examined today.  No new complaints.  Wife at bedside  Physical Exam: Vitals:   11/05/22 0802 11/05/22 0900 11/05/22 1000 11/05/22 1105  BP:  (!) 141/96 (!) 168/84 129/72  Pulse:  91 82 77  Resp:  19 19 16   Temp:    98.2 F (36.8 C)  TempSrc:      SpO2: 96% 90% (!) 86% 94%  Weight:      Height:       General.  Frail elderly man, in no acute distress. Pulmonary.  Lungs clear bilaterally, normal respiratory effort. CV.  Regular rate and rhythm, no JVD, rub or murmur. Abdomen.  Soft, nontender, nondistended, BS positive. CNS.  Alert and oriented .  No focal neurologic deficit. Extremities.  No edema, no cyanosis, pulses intact and symmetrical. Psychiatry.  Judgment and insight appears normal.    Data Reviewed: Prior data reviewed  Family Communication: Discussed with wife at bedside  Disposition: Status is: Inpatient Remains inpatient appropriate because: Severity of illness  Planned Discharge Destination: Home with Home Health  DVT prophylaxis.  Lovenox Time spent: 43 minutes  This record has been created using Systems analyst. Errors have been sought and corrected,but may not always be located. Such creation errors do not reflect on the standard of care.   Author: Lorella Nimrod, MD 11/05/2022 1:46 PM  For on call review [email or web address].

## 2022-11-06 DIAGNOSIS — J9621 Acute and chronic respiratory failure with hypoxia: Secondary | ICD-10-CM | POA: Diagnosis not present

## 2022-11-06 LAB — GLUCOSE, CAPILLARY
Glucose-Capillary: 145 mg/dL — ABNORMAL HIGH (ref 70–99)
Glucose-Capillary: 144 mg/dL — ABNORMAL HIGH (ref 70–99)
Glucose-Capillary: 175 mg/dL — ABNORMAL HIGH (ref 70–99)
Glucose-Capillary: 182 mg/dL — ABNORMAL HIGH (ref 70–99)
Glucose-Capillary: 106 mg/dL — ABNORMAL HIGH (ref 70–99)

## 2022-11-06 MED ORDER — PREDNISONE 10 MG PO TABS
10.0000 mg | ORAL_TABLET | Freq: Every day | ORAL | Status: DC
  Administered 2022-11-07: 10 mg via ORAL
  Filled 2022-11-06: qty 1

## 2022-11-06 MED ORDER — IPRATROPIUM-ALBUTEROL 0.5-2.5 (3) MG/3ML IN SOLN
3.0000 mL | Freq: Two times a day (BID) | RESPIRATORY_TRACT | Status: DC
  Administered 2022-11-06 – 2022-11-12 (×12): 3 mL via RESPIRATORY_TRACT
  Filled 2022-11-06 (×12): qty 3

## 2022-11-06 NOTE — Progress Notes (Signed)
Physical Therapy Treatment Patient Details Name: Gregory Haynes MRN: [id number] DOB: [birth year] Today's Date: 11/06/2022   History of Present Illness Pt is an 69 y 70 M admitted on 10/28/22 after presenting to the ED with c/o progressive SOB over 3-4 days. Pt admitted for tx of COPD exacerbation. Pt transferred to ICU on 10/31/22 for BiPAP & chest CT concerning for atypical/viral PNA. PMH: chronic hypoxic respiratory failure on 2L O2 PRN, COPD, HTN, HLD    PT Comments    Pt was long sitting in bed upon arrival. He is A and O x 3. Slow processing noted. He is HOH but did not have hearing aides in. Author offered to assist with placement but pt requested not to. He was agreeable to session was motivated throughout. Session included exiting L side of bed. Standing to RW and ambulating 50 ft CGA. Prolonged seated rest between gait trials. On the 2nd gait trial, pt ambulated with +1 HHA + min assist. Unsteadiness noted with pt endorsing not feeling safe. Encouraged use of RW until strength and balance improves. He will continue to benefit from skilled PT at DC to maximize his independence and safety with all ADLs.    Recommendations for follow up therapy are one component of a multi-disciplinary discharge planning process, led by the attending physician.  Recommendations may be updated based on patient status, additional functional criteria and insurance authorization.     Assistance Recommended at Discharge Frequent or constant Supervision/Assistance  Patient can return home with the following A little help with walking and/or transfers;A little help with bathing/dressing/bathroom;Assistance with cooking/housework;Direct supervision/assist for financial management;Assist for transportation;Help with stairs or ramp for entrance   Equipment Recommendations  Other (comment) (Defer to next level of care)       Precautions / Restrictions Precautions Precautions: Fall Restrictions Weight Bearing  Restrictions: No     Mobility  Bed Mobility Overal bed mobility: Needs Assistance Bed Mobility: Supine to Sit  Supine to sit: Min assist, HOB elevated     Transfers Overall transfer level: Needs assistance Equipment used: Rolling walker (2 wheels) Transfers: Sit to/from Stand Sit to Stand: Min assist  General transfer comment: Min assist + vcs for STS from lowest bed height    Ambulation/Gait Ambulation/Gait assistance: Min assist Gait Distance (Feet): 50 Feet Assistive device: 1 person hand held assist, Rolling walker (2 wheels) Gait Pattern/deviations: Decreased step length - left, Decreased step length - right, Decreased stride length, Decreased dorsiflexion - right, Decreased dorsiflexion - left Gait velocity: decreased     General Gait Details: Pt was able to ambulate 2 x 50 ft, 1 with RW and 1 x without AD + 1 HHA. min assist without use of RW . pt does endorse fatigue with minimal gait ditances. On on 2 L o2 throughout gsession with sao2 > 94%. on 2 L o2 at baseline, per patient   Balance Overall balance assessment: Needs assistance Sitting-balance support: Bilateral upper extremity supported, Feet supported Sitting balance-Leahy Scale: Fair     Standing balance support: Bilateral upper extremity supported, During functional activity, Reliant on assistive device for balance Standing balance-Leahy Scale: Fair      Cognition Arousal/Alertness: Awake/alert Behavior During Therapy: WFL for tasks assessed/performed Overall Cognitive Status: Within Functional Limits for tasks assessed    General Comments: pt is A and O x 3. Overall able to consistently follow commands with increased processing time               Pertinent Vitals/Pain Pain Assessment  Pain Assessment: No/denies pain     PT Goals (current goals can now be found in the care plan section) Acute Rehab PT Goals Patient Stated Goal: get better Progress towards PT goals: Progressing toward goals     Frequency    7X/week      PT Plan Current plan remains appropriate       AM-PAC PT "6 Clicks" Mobility   Outcome Measure  Help needed turning from your back to your side while in a flat bed without using bedrails?: A Little Help needed moving from lying on your back to sitting on the side of a flat bed without using bedrails?: A Little Help needed moving to and from a bed to a chair (including a wheelchair)?: A Little Help needed standing up from a chair using your arms (e.g., wheelchair or bedside chair)?: A Little Help needed to walk in hospital room?: A Little Help needed climbing 3-5 steps with a railing? : A Lot 6 Click Score: 17    End of Session Equipment Utilized During Treatment: Oxygen (2 L o2 throughout session) Activity Tolerance: Patient tolerated treatment well;Patient limited by fatigue Patient left: in chair;with call bell/phone within reach;with chair alarm set Nurse Communication: Mobility status PT Visit Diagnosis: Unsteadiness on feet (R26.81);Muscle weakness (generalized) (M62.81);Difficulty in walking, not elsewhere classified (R26.2);Other abnormalities of gait and mobility (R26.89)     Time: [id number] PT Time Calculation (min) (ACUTE ONLY): 24 min  Charges:  $Gait Training: 23-37 mins                    Julaine Fusi PTA 11/06/22, 10:25 AM

## 2022-11-06 NOTE — Progress Notes (Signed)
Inpatient Rehab Admissions Coordinator:   I continue to await insurance authorization for CIR. I will continue to follow and pursue for admit pending auth.  Clemens Catholic, [place], [place] Admissions Coordinator  [phone] ([place]) [phone] (office)

## 2022-11-06 NOTE — Progress Notes (Signed)
Occupational Therapy Treatment Patient Details Name: Gregory QUIZHPI MRN: [id number] DOB: 03/27/1941 Today's Date: 11/06/2022   History of present illness Pt is an 87 y 56 M admitted on 10/28/22 after presenting to the ED with c/o progressive SOB over 3-4 days. Pt admitted for tx of COPD exacerbation. Pt transferred to ICU on 10/31/22 for BiPAP & chest CT concerning for atypical/viral PNA. PMH: chronic hypoxic respiratory failure on 2L O2 PRN, COPD, HTN, HLD   OT comments  Patient received sitting in recliner and agreeable to OT. Tx session targeted increasing activity tolerance for improved ADL completion. Pt A&Ox3 this date with confusion noted t/o session. He completed simulated toilet transfer with Min-Mod A and step by step VC for safe technique. Pt fatigued quickly and required one seated rest break on EOB after completing functional mobility to bedside sink using RW. He engaged in sinkside grooming tasks for <5 min with Min guard for safety. Pt left as received with all needs in reach. Pt is making progress toward goal completion. D/C recommendation remains appropriate. OT will continue to follow acutely.   Pt received on 2L O2 via [place]. SpO2 dropping to mid 80s with activity, not improving with seated rest break and VC for pursed lip breathing. Left pt on 3L in order to recover, RN notified.   Recommendations for follow up therapy are one component of a multi-disciplinary discharge planning process, led by the attending physician.  Recommendations may be updated based on patient status, additional functional criteria and insurance authorization.    Assistance Recommended at Discharge Frequent or constant Supervision/Assistance  Patient can return home with the following  Direct supervision/assist for medications management;Direct supervision/assist for financial management;Assistance with cooking/housework;Assist for transportation;Help with stairs or ramp for entrance;A little help with walking  and/or transfers;A little help with bathing/dressing/bathroom   Equipment Recommendations  BSC/3in1;Tub/shower seat    Recommendations for Other Services      Precautions / Restrictions Precautions Precautions: Fall Restrictions Weight Bearing Restrictions: No       Mobility Bed Mobility Overal bed mobility: Needs Assistance             General bed mobility comments: pt received/left in recliner    Transfers Overall transfer level: Needs assistance Equipment used: Rolling walker (2 wheels) Transfers: Sit to/from Stand Sit to Stand: Min assist, Mod assist           General transfer comment: Min A to stand from recliner, Mod A from EOB after seated rest break. Step by step VC required     Balance Overall balance assessment: Needs assistance Sitting-balance support: Bilateral upper extremity supported, Feet supported Sitting balance-Leahy Scale: Fair     Standing balance support: Bilateral upper extremity supported, During functional activity, Reliant on assistive device for balance Standing balance-Leahy Scale: Fair         ADL either performed or assessed with clinical judgement   ADL Overall ADL's : Needs assistance/impaired     Grooming: Min guard;Standing;Wash/dry face Grooming Details (indicate cue type and reason): limited standing tolerance, <5 min                 Toilet Transfer: Rolling walker (2 wheels);Minimal assistance;Moderate assistance Toilet Transfer Details (indicate cue type and reason): simulated         Functional mobility during ADLs: Min guard;Minimal assistance;Rolling walker (2 wheels) (~10 ft to bedside sink) General ADL Comments: Pt continues to be functionally limited due to generalized weakness and decreased endurance. He fatigued quickly this date  and required one seated rest break.    Extremity/Trunk Assessment Upper Extremity Assessment Upper Extremity Assessment: Generalized weakness   Lower Extremity  Assessment Lower Extremity Assessment: Generalized weakness   Cervical / Trunk Assessment Cervical / Trunk Assessment: Normal    Vision Baseline Vision/History: 1 Wears glasses Patient Visual Report: No change from baseline     Perception     Praxis      Cognition Arousal/Alertness: Awake/alert Behavior During Therapy: WFL for tasks assessed/performed Overall Cognitive Status: Impaired/Different from baseline         General Comments: A&Ox3. Slightly confused this date, able to follow commands with increased time. When attempting STS from EOB pt stating "how do I do this?" (step by step VC required)        Exercises      Shoulder Instructions       General Comments Pt received on 2L O2 via [place] with VSS, SpO2 dropping to 87% with activity and not improving with time or pursed lip breathing. Titrated O2 up to 3L for recovery, RN notified.    Pertinent Vitals/ Pain       Pain Assessment Pain Assessment: No/denies pain  Home Living              Prior Functioning/Environment              Frequency  Min 3X/week        Progress Toward Goals  OT Goals(current goals can now be found in the care plan section)  Progress towards OT goals: Progressing toward goals  Acute Rehab OT Goals Patient Stated Goal: return to PLOF OT Goal Formulation: With patient/family Time For Goal Achievement: 11/18/22 Potential to Achieve Goals: Good  Plan Discharge plan remains appropriate;Frequency remains appropriate    Co-evaluation                 AM-PAC OT "6 Clicks" Daily Activity     Outcome Measure   Help from another person eating meals?: None Help from another person taking care of personal grooming?: A Little Help from another person toileting, which includes using toliet, bedpan, or urinal?: A Lot Help from another person bathing (including washing, rinsing, drying)?: A Lot Help from another person to put on and taking off regular upper body  clothing?: A Little Help from another person to put on and taking off regular lower body clothing?: A Lot 6 Click Score: 16    End of Session Equipment Utilized During Treatment: Gait belt;Rolling walker (2 wheels);Oxygen  OT Visit Diagnosis: Unsteadiness on feet (R26.81);Muscle weakness (generalized) (M62.81)   Activity Tolerance Patient tolerated treatment well;Patient limited by fatigue   Patient Left in chair;with call bell/phone within reach;with chair alarm set   Nurse Communication Mobility status;Other (comment) (O2 levels)        Time: [id number] OT Time Calculation (min): 21 min  Charges: OT General Charges $OT Visit: 1 Visit OT Treatments $Self Care/Home Management : 8-22 mins  [facility] MS, OTR/L ascom [phone]  11/06/22, 12:53 PM

## 2022-11-06 NOTE — Care Management Important Message (Signed)
Important Message  Patient Details  Name: EBUBECHUKWU LETBETTER MRN: [id number] Date of Birth: [birth year]   Medicare Important Message Given:  Yes     [name] 11/06/2022, 12:01 PM

## 2022-11-06 NOTE — TOC Progression Note (Signed)
Transition of Care [facility]) - Progression Note    Patient Details  Name: Gregory Haynes MRN: [id number] Date of Birth: April 09, 1941  Transition of Care [facility]) CM/SW [place], [place] Phone Number: 11/06/2022, 2:26 PM  Clinical Narrative:     Per Caryl Pina with [facility], they report patient has insurance auth for NIV trilogy. CSW has reached out to CIR to see if patient can take to unit there or will need to get after discharge from CIR pending response.        Expected Discharge Plan and Services                                               Social Determinants of Health (SDOH) Interventions SDOH Screenings   Food Insecurity: No Food Insecurity (10/29/2022)  Housing: Low Risk  (10/29/2022)  Transportation Needs: Unmet Transportation Needs (10/29/2022)  Utilities: Not At Risk (10/29/2022)  Alcohol Screen: Low Risk  (07/01/2022)  Depression (PHQ2-9): Low Risk  (07/01/2022)  Financial Resource Strain: Low Risk  (06/24/2018)  Physical Activity: Unknown (07/01/2019)  Social Connections: Unknown (06/24/2018)  Stress: No Stress Concern Present (07/01/2019)  Tobacco Use: Medium Risk (10/28/2022)    Readmission Risk Interventions     No data to display

## 2022-11-06 NOTE — Progress Notes (Addendum)
Progress Note   Patient: Gregory Haynes [id number] DOB: [birth year] DOA: 10/28/2022     8 DOS: the patient was seen and examined on 11/06/2022   Brief hospital course:   Progress Note     Patient: Gregory Haynes [id number] DOB: [birth year] DOA: 10/28/2022     7 DOS: the patient was seen and examined on 11/05/2022   Brief hospital course: ICU transfer.   82 y.o. male with medical history significant of chronic respiratory failure on 2 L, COPD, hypertension, hyperlipidemia presenting with acute on chronic respiratory failure with hypoxia and COPD exacerbation.  Patient reports increased work of breathing over the past 3 to 4 days.  Positive wheezing and increased work of breathing.  No reported sick contacts.  Wife reports that her and the patient went on vacation to the beach.  Patient was noncompliant with wearing his oxygen while on vacation.  No fevers or chills.  Quit smoking 11 years ago.  No chest pain.  No nausea or vomiting.  Has had increased O2 requirements as well as increased inhaler use at home.  Wife reports a significant event of COPD exacerbation almost 10 years ago that required and patient being transferred to [facility] because of declining respiratory status.  Presented to the ER afebrile, hemodynamically stable.  Requiring 2 to 3 L to keep O2 sats greater than 92%.  White count 12.5, hemoglobin 14.7, platelets 311.  Creatinine 0.97.  Glucose 191.  Troponin negative.,  COVID flu and RSV negative. RVP negative.   3/19.  Patient down to his baseline 2 L of oxygen.  With nursing staff it took 2 people to move them around.  Patient sleeping in the afternoon on reevaluation.  As per patient's wife he has not been wearing his oxygen as he normally does on vacation and has a very poor diet over the last 4 days. 3/21: More somnolent due to CO2 Narcosis. Transfer to ICU for BiPAP, PCCM consulted.  CT chest concerning for atypical/viral pneumonia, adding on Rocephin. 3/22: Mentation  improved initially, initially tolerating trial off BiPAP.  Later developed severe agitation and delirium requiring Precedex. 3/23: off bipap and oriented this morning. Short of breath during the day, back on BiPAP.   3/24: Patient remained little tachypneic, saturating well on 2 L of oxygen.  Labs with slight worsening of leukocytosis at 12.5, rest stable.  Weaned off from Precedex.  PT was recommending SNF but wife would like to take him back home with home health services.  Patient will also need BiPAP to use at night and while taking naps during the day.   3/25: Patient stable this morning.  Unable to do overnight pulse ox as he desaturated and was placed on BiPAP yesterday evening.  Most likely candidal today.  Wife now agrees for CIR-pending evaluation and insurance authorization.  Repeat chest x-ray today with severe emphysematous changes and no evidence of lobar consolidation.   Pt has Chronic respiratory Failure second to COPD. Needs non-invasive ventilator to reduce hospitalizations and sustain life.    3/26: Hemodynamically stable.  NIV ordered.  Pending CIR evaluation     3/27: stable, awaiting CIR decision  Assessment and Plan: * Acute on chronic respiratory failure with hypoxia ([place]) Back down to his baseline 2 L of oxygen.  Required BiPAP initially. Patient is requiring BiPAP as needed and when sleeping -Continue with supplemental oxygen -Continue with supportive care  COPD exacerbation ([place]) Most likely secondary to atypical pneumonia.  Patient also received Solu-Medrol. Improving.  No wheezing today -Solu-Medrol was switched with prednisone 20 mg daily, will further decrease to 10 mg daily for 3 more days then stop - approved for niv trilogy through [employer]  CAP (community acquired pneumonia) Concern of atypical infection on imaging.  Strep pneumo and legionella negative.  Completed a course of antibiotics. Repeat chest x-ray with severe emphysematous changes, no  consolidation  Toxic metabolic encephalopathy CO2 narcosis.  Concern of CO2 narcosis versus ICU delirium, requiring Precedex infusion.  Which has been weaned off.  Mental status now appears to be at baseline and patient appears much calmer. -Continue to monitor -Will need BiPAP at home to be used at night and when sleeping  Generalized weakness PT is recommending SNF.  Wife would like to take him back home with home health services which were ordered.  Coronary artery disease of native artery of native heart with stable angina pectoris (HCC) Continue Coreg and atorvastatin  HTN, goal below 130/80 Continue Coreg and lisinopril  Low tsh 0.141 on presentation - will repeat and check t3/t4  Other hyperlipidemia Continue Lipitor    Subjective: Patient was seen and examined today.  No new complaints.  Thinks energy and breathing are better  Physical Exam: Vitals:   11/06/22 0413 11/06/22 0807 11/06/22 0836 11/06/22 1322  BP: 137/78 (!) 140/82  134/61  Pulse: 77 71  72  Resp: 20 16    Temp: (!) 97.4 F (36.3 C) (!) 97.5 F (36.4 C)    TempSrc:  Oral    SpO2: 97% 97% 94% 92%  Weight:      Height:       General.  Frail elderly man, in no acute distress. Pulmonary.  Lungs clear bilaterally, normal respiratory effort. CV.  Regular rate and rhythm, no JVD, rub or murmur. Abdomen.  Soft, nontender, nondistended, BS positive. CNS.  Alert and oriented .  No focal neurologic deficit. Extremities.  No edema, no cyanosis, pulses intact and symmetrical. Psychiatry.  Judgment and insight appears normal.    Data Reviewed: Prior data reviewed  Family Communication: Discussed with wife at bedside 3/27  Disposition: Status is: Inpatient Remains inpatient appropriate because: Severity of illness  Planned Discharge Destination: Home with Home Health  DVT prophylaxis.  Lovenox Time spent: 35 min  Author: Desma Maxim, MD 11/06/2022 1:58 PM  For on call review [email or web address].

## 2022-11-07 DIAGNOSIS — J9621 Acute and chronic respiratory failure with hypoxia: Secondary | ICD-10-CM | POA: Diagnosis not present

## 2022-11-07 LAB — TSH: TSH: 0.816 u[IU]/mL (ref 0.350–4.500)

## 2022-11-07 LAB — GLUCOSE, CAPILLARY
Glucose-Capillary: 183 mg/dL — ABNORMAL HIGH (ref 70–99)
Glucose-Capillary: 115 mg/dL — ABNORMAL HIGH (ref 70–99)
Glucose-Capillary: 231 mg/dL — ABNORMAL HIGH (ref 70–99)
Glucose-Capillary: 113 mg/dL — ABNORMAL HIGH (ref 70–99)
Glucose-Capillary: 67 mg/dL — ABNORMAL LOW (ref 70–99)

## 2022-11-07 LAB — T4, FREE: Free T4: 1.16 ng/dL — ABNORMAL HIGH (ref 0.61–1.12)

## 2022-11-07 MED ORDER — PREDNISONE 10 MG PO TABS
5.0000 mg | ORAL_TABLET | Freq: Every day | ORAL | Status: DC
  Administered 2022-11-08: 5 mg via ORAL
  Filled 2022-11-07: qty 1

## 2022-11-07 NOTE — TOC Progression Note (Addendum)
Transition of Care [facility]) - Progression Note    Patient Details  Name: Gregory Haynes MRN: [id number] Date of Birth: [birth year]  [facility]) CM/SW Contact  Laurena Slimmer, RN Phone Number: 11/07/2022, 2:11 PM  Clinical Narrative:    Spoke with patient's wife. She stated she was told patient would be able to got to CIR and was waiting on insurance approval.  Evalee Jefferson from [place] contacted to advised patient spouse would prefer CIR now.  Per Loreta Ave from CIR. Josem Kaufmann is pending.        Expected Discharge Plan and Services                                               Social Determinants of Health (SDOH) Interventions SDOH Screenings   Food Insecurity: No Food Insecurity (10/29/2022)  Housing: Low Risk  (10/29/2022)  Transportation Needs: Unmet Transportation Needs (10/29/2022)  Utilities: Not At Risk (10/29/2022)  Alcohol Screen: Low Risk  (07/01/2022)  Depression (PHQ2-9): Low Risk  (07/01/2022)  Financial Resource Strain: Low Risk  (06/24/2018)  Physical Activity: Unknown (07/01/2019)  Social Connections: Unknown (06/24/2018)  Stress: No Stress Concern Present (07/01/2019)  Tobacco Use: Medium Risk (10/28/2022)    Readmission Risk Interventions     No data to display

## 2022-11-07 NOTE — Progress Notes (Signed)
PULMONOLOGY         Date: 11/07/2022,   MRN# [id number] Gregory Haynes [birth year]     AdmissionWeight: 86.3 kg                 CurrentWeight: 86.3 kg  Referring provider: Dr Jimmye Norman   CHIEF COMPLAINT:   Acute on chronic hypoxemic respiratory failure   HISTORY OF PRESENT ILLNESS   Gregory Haynes is a 82 y.o. male with medical history significant of chronic respiratory failure on 2 L, COPD, hypertension, hyperlipidemia presenting with acute on chronic respiratory failure with hypoxia and COPD exacerbation. Family is here and able to help me with details of history as patient is somnolent.  Patient is poorly responsive somnolent. No reported sick contacts.  Wife reports that her and the patient went on vacation to the beach he ate a large meal and started getting worse after this. .  Patient was noncompliant with wearing his oxygen while on vacation.  No fevers or chills.  Quit smoking 11 years ago.  No chest pain.  No nausea or vomiting.  Has had increased O2 requirements as well as increased inhaler use at home.  Wife reports ICU admission with endotracheal intubation 10 yrs ago. Presented to the ER afebrile, hemodynamically stable.  Requiring 2 to 3 L to keep O2 sats greater than 92%.  White count 12.5, hemoglobin 14.7, platelets 311.  Creatinine 0.97.  Glucose 191.  Troponin negative x 1.  COVID flu and RSV negative. CT chest reviewed by me shows acute bronchitis on background of severe emphysema. ABG with severe hypercarbia and hypoxemia. Pulmonary consultation for further evaluation and management.   11/04/22- patient is no longer encephalopathic.  His voice is hoarse but mentation is clear AAOx2.  He is ating now. He has PT coming up.  He is being optimized for dc from step down  11/05/22- patient seen and examined at bedside. Mrs [name] is at bedside, patient is ambulatory and is now with PT.  He still has urinary catheter.  He is on prednisone 20mg  and MDI inhalers  with Breo and Incruse ellipta.  Patient is being optimized for SNF to rehab some more.   11/06/22- patient continues to improve.  He is lucid and orented x 3.  He feels closer to baseline now. He would like to continue to have PT and feels stronger.  Reducing prednisone.    11/07/22- patient share's he is ready to leave and is awaiting rehab placement.  He did choke on food today but states he is breathig well.  Wife is at bedside states he feels more tired today then yesterday.  Patient asks to walk around hallway I have placed order.  Steroids to continue weaning 1 more day on 5mg  then 2.5mg  then dc.    PAST MEDICAL HISTORY   Past Medical History:  Diagnosis Date   Anemia    Cancer ([place])    BCC   Cataract    COPD (chronic obstructive pulmonary disease) ([place])    Helicobacter pylori ab+    Hyperlipidemia    Hypertension    Psoriasis    Wears hearing aid in both ears      SURGICAL HISTORY   Past Surgical History:  Procedure Laterality Date   CATARACT EXTRACTION W/PHACO Left 08/18/2019   Procedure: CATARACT EXTRACTION PHACO AND INTRAOCULAR LENS PLACEMENT (IOC) LEFT 6.68,   00:54.1,   30.8%;  Surgeon: Leandrew Koyanagi, MD;  Location: [place];  Service:  Ophthalmology;  Laterality: Left;   COLONOSCOPY       FAMILY HISTORY   Family History  Problem Relation Age of Onset   Heart attack Father    Heart disease Brother    Hyperlipidemia Brother    Alcohol abuse Brother      SOCIAL HISTORY   Social History   Tobacco Use   Smoking status: Former    Packs/day: 1.50    Years: 55.00    Additional pack years: 0.00    Total pack years: 82.50    Types: Cigarettes    Quit date: 08/13/2011    Years since quitting: 11.2   Smokeless tobacco: Never  Vaping Use   Vaping Use: Never used  Substance Use Topics   Alcohol use: Yes    Comment: occasionally   Drug use: No     MEDICATIONS    Home Medication:    Current Medication:  Current Facility-Administered  Medications:    0.9 %  sodium chloride infusion, , Intravenous, PRN, Lorella Nimrod, MD, Last Rate: 10 mL/hr at 11/04/22 1600, Infusion Verify at 11/04/22 1600   albuterol (PROVENTIL) (2.5 MG/3ML) 0.083% nebulizer solution 2.5 mg, 2.5 mg, Nebulization, Q2H PRN, Dgayli, Khabib, MD, 2.5 mg at 11/02/22 1132   ALPRAZolam (XANAX) tablet 0.25 mg, 0.25 mg, Oral, BID PRN, Lang Snow, NP, 0.25 mg at 11/04/22 2100   atorvastatin (LIPITOR) tablet 40 mg, 40 mg, Oral, QPM, Deneise Lever, MD, 40 mg at 11/06/22 1751   carvedilol (COREG) tablet 12.5 mg, 12.5 mg, Oral, BID WC, Deneise Lever, MD, 12.5 mg at 11/07/22 0835   enoxaparin (LOVENOX) injection 40 mg, 40 mg, Subcutaneous, Q24H, Belue, Alver Sorrow, RPH, 40 mg at 11/07/22 0836   feeding supplement (ENSURE ENLIVE / ENSURE PLUS) liquid 237 mL, 237 mL, Oral, TID, Wyvonnia Dusky, MD, 237 mL at 11/07/22 1033   insulin aspart (novoLOG) injection 0-20 Units, 0-20 Units, Subcutaneous, TID WC, Ouma, Bing Neighbors, NP, 4 Units at 11/07/22 0839   insulin aspart (novoLOG) injection 0-5 Units, 0-5 Units, Subcutaneous, QHS, Ouma, Bing Neighbors, NP   ipratropium-albuterol (DUONEB) 0.5-2.5 (3) MG/3ML nebulizer solution 3 mL, 3 mL, Nebulization, BID, Wouk, Ailene Rud, MD, 3 mL at 11/07/22 0740   lisinopril (ZESTRIL) tablet 10 mg, 10 mg, Oral, Daily, Lorella Nimrod, MD, 10 mg at 11/07/22 0835   ondansetron (ZOFRAN) tablet 4 mg, 4 mg, Oral, Q6H PRN **OR** ondansetron (ZOFRAN) injection 4 mg, 4 mg, Intravenous, Q6H PRN, Deneise Lever, MD   polyethylene glycol (MIRALAX / GLYCOLAX) packet 17 g, 17 g, Oral, Daily, Amin, Soundra Pilon, MD, 17 g at 11/06/22 0926   [START ON 11/08/2022] predniSONE (DELTASONE) tablet 5 mg, 5 mg, Oral, Q breakfast, Lanney Gins, Zhamir Pirro, MD   senna-docusate (Senokot-S) tablet 2 tablet, 2 tablet, Oral, BID PRN, Rito Ehrlich A, RPH, 2 tablet at 11/05/22 1027    ALLERGIES   Patient has no known allergies.     REVIEW OF SYSTEMS     Review of Systems:  Gen:  Denies  fever, sweats, chills weigh loss  HEENT: Denies blurred vision, double vision, ear pain, eye pain, hearing loss, nose bleeds, sore throat Cardiac:  No dizziness, chest pain or heaviness, chest tightness,edema Resp:   reports dyspnea chronically  Gi: Denies swallowing difficulty, stomach pain, nausea or vomiting, diarrhea, constipation, bowel incontinence Gu:  Denies bladder incontinence, burning urine Ext:   Denies Joint pain, stiffness or swelling Skin: Denies  skin rash, easy bruising or bleeding or hives Endoc:  Denies polyuria, polydipsia , polyphagia or weight change Psych:   Denies depression, insomnia or hallucinations   Other:  All other systems negative   VS: BP 134/66 (BP Location: Left Arm)   Pulse 74   Temp 98.4 F (36.9 C) (Oral)   Resp 18   Ht 5\' 11"  (1.803 m)   Wt 86.3 kg   SpO2 96%   BMI 26.54 kg/m      PHYSICAL EXAM    GENERAL:NAD, no fevers, chills, no weakness no fatigue HEAD: Normocephalic, atraumatic.  EYES: Pupils equal, round, reactive to light. Extraocular muscles intact. No scleral icterus.  MOUTH: Moist mucosal membrane. Dentition intact. No abscess noted.  EAR, NOSE, THROAT: Clear without exudates. No external lesions.  NECK: Supple. No thyromegaly. No nodules. No JVD.  PULMONARY: decreased breath sounds with mild rhonchi worse at bases bilaterally.  CARDIOVASCULAR: S1 and S2. Regular rate and rhythm. No murmurs, rubs, or gallops. No edema. Pedal pulses 2+ bilaterally.  GASTROINTESTINAL: Soft, nontender, nondistended. No masses. Positive bowel sounds. No hepatosplenomegaly.  MUSCULOSKELETAL: No swelling, clubbing, or edema. Range of motion full in all extremities.  NEUROLOGIC: Cranial nerves II through XII are intact. No gross focal neurological deficits. Sensation intact. Reflexes intact.  SKIN: No ulceration, lesions, rashes, or cyanosis. Skin warm and dry. Turgor intact.  PSYCHIATRIC: Mood, affect within  normal limits. The patient is awake, alert and oriented x 3. Insight, judgment intact.       IMAGING     ASSESSMENT/PLAN   Acute exacerbation of COPD    -Patient with hypercarbic encephalopathy currently somnolent.    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  -optimized for dc to SNF per physical therapy   - continue PAP on outpatient due to AECOPD with hypercapnic encephalopathy.        Thank you for allowing me to participate in the care of this patient.   Patient/Family are satisfied with care plan and all questions have been answered.    Provider disclosure: Patient with at least one acute or chronic illness or injury that poses a threat to life or bodily function and is being managed actively during this encounter.  All of the below services have been performed independently by signing provider:  review of prior documentation from internal and or external health records.  Review of previous and current lab results.  Interview and comprehensive assessment during patient visit today. Review of current and previous chest radiographs/CT scans. Discussion of management and test interpretation with health care team and patient/family.   This document was prepared using Dragon voice recognition software and may include unintentional dictation errors.     Ottie Glazier, M.D.  Division of Pulmonary & Critical Care Medicine

## 2022-11-07 NOTE — Progress Notes (Signed)
This RN placed patient on BIPAP at 2230pm. Patient just called Nurses station this RN responded Patient refused BIPAP. Patient stated "I can not breathe I feel like I am suffocating. Patient educated on the benefits of BIPAP. Patient continues to refuse. Charge Nurse notified. BIPAP off at 2315pm.

## 2022-11-07 NOTE — TOC Progression Note (Signed)
Transition of Care [facility]) - Progression Note    Patient Details  Name: Gregory Haynes MRN: [id number] Date of Birth: [birth year]  Transition [facility] CM/SW Contact  Ross Ludwig, [place] Phone Number: 11/07/2022, 5:03 PM  Clinical Narrative:     CSW spoke to Genie at [facility] who confirmed patient's insurance is still pending for CIR placement.  Per patient's wife she thought insurance was approved for CIR already.  CSW reviewed chart, and patient was approved for DME NIV by insurance company not CIR.  CIR insurance authorization is still pending, CSW requested to have Genie at [facility] contact wife and explain the insurance authorization process.  CSW also updated Chrystie Nose 2A director who was talking with patient's wife.  TOC to continue to follow patient's progress throughout discharge planning.          Expected Discharge Plan and Services                                               Social Determinants of Health (SDOH) Interventions SDOH Screenings   Food Insecurity: No Food Insecurity (10/29/2022)  Housing: Low Risk  (10/29/2022)  Transportation Needs: Unmet Transportation Needs (10/29/2022)  Utilities: Not At Risk (10/29/2022)  Alcohol Screen: Low Risk  (07/01/2022)  Depression (PHQ2-9): Low Risk  (07/01/2022)  Financial Resource Strain: Low Risk  (06/24/2018)  Physical Activity: Unknown (07/01/2019)  Social Connections: Unknown (06/24/2018)  Stress: No Stress Concern Present (07/01/2019)  Tobacco Use: Medium Risk (10/28/2022)    Readmission Risk Interventions     No data to display

## 2022-11-07 NOTE — Progress Notes (Signed)
Physical Therapy Treatment Patient Details Name: Gregory Haynes MRN: [id number] DOB: March 26, 1941 Today's Date: 11/07/2022   History of Present Illness Pt is an 38 y 89 M admitted on 10/28/22 after presenting to the ED with c/o progressive SOB over 3-4 days. Pt admitted for tx of COPD exacerbation. Pt transferred to ICU on 10/31/22 for BiPAP & chest CT concerning for atypical/viral PNA. PMH: chronic hypoxic respiratory failure on 2L O2 PRN, COPD, HTN, HLD    PT Comments    Pt was sitting in recliner upon arrival. He is A and more cognitively aware of current situation. He remains motivated to improve and willing to participate in PT session. Pt presented with improved activity tolerance today. He remains on 2 L o2 with sao2 > 92%. Was able to stand and tolerate increased gait distances with use of RW. Overall pt remains deconditioned with several standing rest to ambulate ~100 ft. HR stable but pt visibly fatigued from minimal activity. He will benefit from post acute skilled PT to maximize his activity tolerance and safety with all ADLs. Acute PT will continue to follow per current POC.   Recommendations for follow up therapy are one component of a multi-disciplinary discharge planning process, led by the attending physician.  Recommendations may be updated based on patient status, additional functional criteria and insurance authorization.     Assistance Recommended at Discharge Intermittent Supervision/Assistance  Patient can return home with the following A little help with walking and/or transfers;A little help with bathing/dressing/bathroom;Assistance with cooking/housework;Direct supervision/assist for financial management;Assist for transportation;Help with stairs or ramp for entrance   Equipment Recommendations  Other (comment) (Defer to next level of care)       Precautions / Restrictions Precautions Precautions: Fall Restrictions Weight Bearing Restrictions: No     Mobility  Bed  Mobility  General bed mobility comments: in recliner pre/post session    Transfers Overall transfer level: Needs assistance Equipment used: Rolling walker (2 wheels) Transfers: Sit to/from Stand Sit to Stand: Min guard    General transfer comment: Pt was able to stand from recliner with CGA. He does require increasd time to perform with vcs for handplacement, fwd wt shift, and sliding hips fwd prior to attempting to stand.    Ambulation/Gait Ambulation/Gait assistance: Min guard Gait Distance (Feet): 100 Feet Assistive device: Rolling walker (2 wheels) Gait Pattern/deviations: Decreased step length - left, Decreased step length - right, Decreased stride length, Decreased dorsiflexion - right, Decreased dorsiflexion - left Gait velocity: decreased     General Gait Details: Gait training focused on improving activity tolerance and strength versus balance. Pt was able to tolerate increased gait distances but still requires severeal standing rest breaks. Vcs for posture corection + improved heel strike. Pt was less SOB and fatigued than previous session. HR and sao2 stable on 2 L o2    Balance Overall balance assessment: Needs assistance Sitting-balance support: Bilateral upper extremity supported, Feet supported Sitting balance-Leahy Scale: Fair     Standing balance support: Bilateral upper extremity supported, During functional activity, Reliant on assistive device for balance Standing balance-Leahy Scale: Fair       Cognition Arousal/Alertness: Awake/alert Behavior During Therapy: WFL for tasks assessed/performed Overall Cognitive Status: No family/caregiver present to determine baseline cognitive functioning      General Comments: Pt is A and  present with improved cognition from previous dte however still has some cognition concerns come to light during session. he is extremely cooperative and motivate d  Pertinent Vitals/Pain Pain Assessment Pain  Assessment: No/denies pain           PT Goals (current goals can now be found in the care plan section) Acute Rehab PT Goals Patient Stated Goal: get better    Frequency    7X/week      PT Plan Current plan remains appropriate       AM-PAC PT "6 Clicks" Mobility   Outcome Measure  Help needed turning from your back to your side while in a flat bed without using bedrails?: A Little Help needed moving from lying on your back to sitting on the side of a flat bed without using bedrails?: A Little Help needed moving to and from a bed to a chair (including a wheelchair)?: A Little Help needed standing up from a chair using your arms (e.g., wheelchair or bedside chair)?: A Little Help needed to walk in hospital room?: A Little Help needed climbing 3-5 steps with a railing? : A Lot 6 Click Score: 17    End of Session Equipment Utilized During Treatment: Oxygen (2 L o2 at baseline) Activity Tolerance: Patient tolerated treatment well;Patient limited by fatigue Patient left: in chair;with call bell/phone within reach;with chair alarm set Nurse Communication: Mobility status PT Visit Diagnosis: Unsteadiness on feet (R26.81);Muscle weakness (generalized) (M62.81);Difficulty in walking, not elsewhere classified (R26.2);Other abnormalities of gait and mobility (R26.89)     Time:  -     Charges:                        Julaine Fusi PTA 11/07/22, 6:18 PM

## 2022-11-07 NOTE — Progress Notes (Signed)
PULMONOLOGY         Date: 11/07/2022,   MRN# [id number] Gregory Haynes 10-10-1940     AdmissionWeight: 86.3 kg                 CurrentWeight: 86.3 kg  Referring provider: Dr Jimmye Norman   CHIEF COMPLAINT:   Acute on chronic hypoxemic respiratory failure   HISTORY OF PRESENT ILLNESS   Gregory Haynes is a 82 y.o. male with medical history significant of chronic respiratory failure on 2 L, COPD, hypertension, hyperlipidemia presenting with acute on chronic respiratory failure with hypoxia and COPD exacerbation. Family is here and able to help me with details of history as patient is somnolent.  Patient is poorly responsive somnolent. No reported sick contacts.  Wife reports that her and the patient went on vacation to the beach he ate a large meal and started getting worse after this. .  Patient was noncompliant with wearing his oxygen while on vacation.  No fevers or chills.  Quit smoking 11 years ago.  No chest pain.  No nausea or vomiting.  Has had increased O2 requirements as well as increased inhaler use at home.  Wife reports ICU admission with endotracheal intubation 10 yrs ago. Presented to the ER afebrile, hemodynamically stable.  Requiring 2 to 3 L to keep O2 sats greater than 92%.  White count 12.5, hemoglobin 14.7, platelets 311.  Creatinine 0.97.  Glucose 191.  Troponin negative x 1.  COVID flu and RSV negative. CT chest reviewed by me shows acute bronchitis on background of severe emphysema. ABG with severe hypercarbia and hypoxemia. Pulmonary consultation for further evaluation and management.   11/04/22- patient is no longer encephalopathic.  His voice is hoarse but mentation is clear AAOx2.  He is ating now. He has PT coming up.  He is being optimized for dc from step down  11/05/22- patient seen and examined at bedside. Mrs Gregory Haynes is at bedside, patient is ambulatory and is now with PT.  He still has urinary catheter.  He is on prednisone 20mg  and MDI inhalers  with Breo and Incruse ellipta.  Patient is being optimized for SNF to rehab some more.   11/06/22- patient continues to improve.  He is lucid and orented x 3.  He feels closer to baseline now. He would like to continue to have PT and feels stronger.  Reducing prednisone.     PAST MEDICAL HISTORY   Past Medical History:  Diagnosis Date   Anemia    Cancer ([place])    BCC   Cataract    COPD (chronic obstructive pulmonary disease) ([address])    Helicobacter pylori ab+    Hyperlipidemia    Hypertension    Psoriasis    Wears hearing aid in both ears      SURGICAL HISTORY   Past Surgical History:  Procedure Laterality Date   CATARACT EXTRACTION W/PHACO Left 08/18/2019   Procedure: CATARACT EXTRACTION PHACO AND INTRAOCULAR LENS PLACEMENT (IOC) LEFT 6.68,   00:54.1,   30.8%;  Surgeon: Leandrew Koyanagi, MD;  Location: [facility];  Service: Ophthalmology;  Laterality: Left;   COLONOSCOPY       FAMILY HISTORY   Family History  Problem Relation Age of Onset   Heart attack Father    Heart disease Brother    Hyperlipidemia Brother    Alcohol abuse Brother      SOCIAL HISTORY   Social History   Tobacco Use   Smoking status:  Former    Packs/day: 1.50    Years: 55.00    Additional pack years: 0.00    Total pack years: 82.50    Types: Cigarettes    Quit date: 08/13/2011    Years since quitting: 11.2   Smokeless tobacco: Never  Vaping Use   Vaping Use: Never used  Substance Use Topics   Alcohol use: Yes    Comment: occasionally   Drug use: No     MEDICATIONS    Home Medication:    Current Medication:  Current Facility-Administered Medications:    0.9 %  sodium chloride infusion, , Intravenous, PRN, Lorella Nimrod, MD, Last Rate: 10 mL/hr at 11/04/22 1600, Infusion Verify at 11/04/22 1600   albuterol (PROVENTIL) (2.5 MG/3ML) 0.083% nebulizer solution 2.5 mg, 2.5 mg, Nebulization, Q2H PRN, Dgayli, Khabib, MD, 2.5 mg at 11/02/22 1132   ALPRAZolam (XANAX) tablet  0.25 mg, 0.25 mg, Oral, BID PRN, Lang Snow, NP, 0.25 mg at 11/04/22 2100   atorvastatin (LIPITOR) tablet 40 mg, 40 mg, Oral, QPM, Deneise Lever, MD, 40 mg at 11/06/22 1751   carvedilol (COREG) tablet 12.5 mg, 12.5 mg, Oral, BID WC, Deneise Lever, MD, 12.5 mg at 11/06/22 1752   enoxaparin (LOVENOX) injection 40 mg, 40 mg, Subcutaneous, Q24H, Belue, Alver Sorrow, RPH, 40 mg at 11/06/22 [id number]   feeding supplement (ENSURE ENLIVE / ENSURE PLUS) liquid 237 mL, 237 mL, Oral, TID, Jimmye Norman, Jamiese M, MD, 237 mL at 11/06/22 1600   insulin aspart (novoLOG) injection 0-20 Units, 0-20 Units, Subcutaneous, TID WC, Ouma, Bing Neighbors, NP, 4 Units at 11/06/22 1752   insulin aspart (novoLOG) injection 0-5 Units, 0-5 Units, Subcutaneous, QHS, Ouma, Bing Neighbors, NP   ipratropium-albuterol (DUONEB) 0.5-2.5 (3) MG/3ML nebulizer solution 3 mL, 3 mL, Nebulization, BID, Wouk, Ailene Rud, MD, 3 mL at 11/07/22 0740   lisinopril (ZESTRIL) tablet 10 mg, 10 mg, Oral, Daily, Lorella Nimrod, MD, 10 mg at 11/06/22 0926   ondansetron (ZOFRAN) tablet 4 mg, 4 mg, Oral, Q6H PRN **OR** ondansetron (ZOFRAN) injection 4 mg, 4 mg, Intravenous, Q6H PRN, Deneise Lever, MD   polyethylene glycol (MIRALAX / GLYCOLAX) packet 17 g, 17 g, Oral, Daily, Lorella Nimrod, MD, 17 g at 11/06/22 0926   predniSONE (DELTASONE) tablet 10 mg, 10 mg, Oral, Q breakfast, Wouk, Ailene Rud, MD   senna-docusate (Senokot-S) tablet 2 tablet, 2 tablet, Oral, BID PRN, Pearla Dubonnet, RPH, 2 tablet at 11/05/22 1027    ALLERGIES   Patient has no known allergies.     REVIEW OF SYSTEMS    Review of Systems:  Gen:  Denies  fever, sweats, chills weigh loss  HEENT: Denies blurred vision, double vision, ear pain, eye pain, hearing loss, nose bleeds, sore throat Cardiac:  No dizziness, chest pain or heaviness, chest tightness,edema Resp:   reports dyspnea chronically  Gi: Denies swallowing difficulty, stomach pain, nausea or  vomiting, diarrhea, constipation, bowel incontinence Gu:  Denies bladder incontinence, burning urine Ext:   Denies Joint pain, stiffness or swelling Skin: Denies  skin rash, easy bruising or bleeding or hives Endoc:  Denies polyuria, polydipsia , polyphagia or weight change Psych:   Denies depression, insomnia or hallucinations   Other:  All other systems negative   VS: BP (!) 143/75 (BP Location: Right Arm)   Pulse 73   Temp 98.1 F (36.7 C) (Oral)   Resp 18   Ht 5\' 11"  (1.803 m)   Wt 86.3 kg   SpO2 98%  BMI 26.54 kg/m      PHYSICAL EXAM    GENERAL:NAD, no fevers, chills, no weakness no fatigue HEAD: Normocephalic, atraumatic.  EYES: Pupils equal, round, reactive to light. Extraocular muscles intact. No scleral icterus.  MOUTH: Moist mucosal membrane. Dentition intact. No abscess noted.  EAR, NOSE, THROAT: Clear without exudates. No external lesions.  NECK: Supple. No thyromegaly. No nodules. No JVD.  PULMONARY: decreased breath sounds with mild rhonchi worse at bases bilaterally.  CARDIOVASCULAR: S1 and S2. Regular rate and rhythm. No murmurs, rubs, or gallops. No edema. Pedal pulses 2+ bilaterally.  GASTROINTESTINAL: Soft, nontender, nondistended. No masses. Positive bowel sounds. No hepatosplenomegaly.  MUSCULOSKELETAL: No swelling, clubbing, or edema. Range of motion full in all extremities.  NEUROLOGIC: Cranial nerves II through XII are intact. No gross focal neurological deficits. Sensation intact. Reflexes intact.  SKIN: No ulceration, lesions, rashes, or cyanosis. Skin warm and dry. Turgor intact.  PSYCHIATRIC: Mood, affect within normal limits. The patient is awake, alert and oriented x 3. Insight, judgment intact.       IMAGING     ASSESSMENT/PLAN   Acute exacerbation of COPD    -Patient with hypercarbic encephalopathy currently somnolent.    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  -optimized for dc to SNF per physical therapy   - continue PAP on outpatient due to AECOPD with hypercapnic encephalopathy.        Thank you for allowing me to participate in the care of this patient.   Patient/Family are satisfied with care plan and all questions have been answered.    Provider disclosure: Patient with at least one acute or chronic illness or injury that poses a threat to life or bodily function and is being managed actively during this encounter.  All of the below services have been performed independently by signing provider:  review of prior documentation from internal and or external health records.  Review of previous and current lab results.  Interview and comprehensive assessment during patient visit today. Review of current and previous chest radiographs/CT scans. Discussion of management and test interpretation with health care team and patient/family.   This document was prepared using Dragon voice recognition software and may include unintentional dictation errors.     Ottie Glazier, M.D.  Division of Pulmonary & Critical Care Medicine

## 2022-11-07 NOTE — Progress Notes (Signed)
Progress Note   Patient: Gregory Haynes [id number] DOB: 03/10/1941 DOA: 10/28/2022     9 DOS: the patient was seen and examined on 11/07/2022   Brief hospital course:   Progress Note     Patient: Gregory Haynes [id number] DOB: [birth year] DOA: 10/28/2022     7 DOS: the patient was seen and examined on 11/05/2022   Brief hospital course: ICU transfer.   82 y.o. male with medical history significant of chronic respiratory failure on 2 L, COPD, hypertension, hyperlipidemia presenting with acute on chronic respiratory failure with hypoxia and COPD exacerbation.  Patient reports increased work of breathing over the past 3 to 4 days.  Positive wheezing and increased work of breathing.  No reported sick contacts.  Wife reports that her and the patient went on vacation to the beach.  Patient was noncompliant with wearing his oxygen while on vacation.  No fevers or chills.  Quit smoking 11 years ago.  No chest pain.  No nausea or vomiting.  Has had increased O2 requirements as well as increased inhaler use at home.  Wife reports a significant event of COPD exacerbation almost 10 years ago that required and patient being transferred to [facility] because of declining respiratory status.  Presented to the ER afebrile, hemodynamically stable.  Requiring 2 to 3 L to keep O2 sats greater than 92%.  White count 12.5, hemoglobin 14.7, platelets 311.  Creatinine 0.97.  Glucose 191.  Troponin negative.,  COVID flu and RSV negative. RVP negative.   3/19.  Patient down to his baseline 2 L of oxygen.  With nursing staff it took 2 people to move them around.  Patient sleeping in the afternoon on reevaluation.  As per patient's wife he has not been wearing his oxygen as he normally does on vacation and has a very poor diet over the last 4 days. 3/21: More somnolent due to CO2 Narcosis. Transfer to ICU for BiPAP, PCCM consulted.  CT chest concerning for atypical/viral pneumonia, adding on Rocephin. 3/22: Mentation  improved initially, initially tolerating trial off BiPAP.  Later developed severe agitation and delirium requiring Precedex. 3/23: off bipap and oriented this morning. Short of breath during the day, back on BiPAP.   3/24: Patient remained little tachypneic, saturating well on 2 L of oxygen.  Labs with slight worsening of leukocytosis at 12.5, rest stable.  Weaned off from Precedex.  PT was recommending SNF but wife would like to take him back home with home health services.  Patient will also need BiPAP to use at night and while taking naps during the day.   3/25: Patient stable this morning.  Unable to do overnight pulse ox as he desaturated and was placed on BiPAP yesterday evening.  Most likely candidal today.  Wife now agrees for CIR-pending evaluation and insurance authorization.  Repeat chest x-ray today with severe emphysematous changes and no evidence of lobar consolidation.   Pt has Chronic respiratory Failure second to COPD. Needs non-invasive ventilator to reduce hospitalizations and sustain life.    3/26: Hemodynamically stable.  NIV ordered.  Pending CIR evaluation     3/27: stable, awaiting CIR decision  Assessment and Plan: * Acute on chronic respiratory failure with hypoxia ([place]) Back down to his baseline 2 L of oxygen.  Required BiPAP initially. Patient is requiring BiPAP as needed and when sleeping -Continue with supplemental oxygen -Continue with supportive care  COPD exacerbation ([place]) Most likely secondary to atypical pneumonia.  Patient also received Solu-Medrol. Improving.  No wheezing today -Solu-Medrol was switched with prednisone now completing taper last dose tomorrow - approved for niv trilogy through [employer]  CAP (community acquired pneumonia) Concern of atypical infection on imaging.  Strep pneumo and legionella negative.  Completed a course of antibiotics. Repeat chest x-ray with severe emphysematous changes, no consolidation  Toxic metabolic  encephalopathy CO2 narcosis.  Concern of CO2 narcosis versus ICU delirium, requiring Precedex infusion.  Which has been weaned off.  Mental status now appears to be at baseline and patient appears much calmer. -Continue to monitor -Will need BiPAP at home to be used at night and when sleeping, has been approved for NIV  Generalized weakness CIR being pursued  Coronary artery disease of native artery of native heart with stable angina pectoris (HCC) Continue Coreg and atorvastatin  HTN, goal below 130/80 Continue Coreg and lisinopril  Low tsh 0.141 on presentation, normalized on re-check - t3/4 pending  Other hyperlipidemia Continue Lipitor    Subjective: Patient was seen and examined today.  No new complaints.  Choked on food earlier but feeling fine after no change in respiratory status  Physical Exam: Vitals:   11/07/22 0535 11/07/22 0738 11/07/22 1100 11/07/22 1521  BP: (!) 110/54 (!) 143/75 134/66 (!) 147/66  Pulse: 69 73 74 86  Resp: 18 18  20   Temp: 97.6 F (36.4 C) 98.1 F (36.7 C) 98.4 F (36.9 C) (!) 89.2 F (31.8 C)  TempSrc:  Oral Oral Oral  SpO2: 98%  96% 93%  Weight:      Height:       General.  Frail elderly man, in no acute distress. Pulmonary.  Lungs clear bilaterally, normal respiratory effort. CV.  Regular rate and rhythm, no JVD, rub or murmur. Abdomen.  Soft, nontender, nondistended, BS positive. CNS.  Alert and oriented .  No focal neurologic deficit. Extremities.  No edema, no cyanosis, pulses intact and symmetrical. Psychiatry.  Judgment and insight appears normal.    Data Reviewed: Prior data reviewed  Family Communication: Discussed with wife at bedside 3/28  Disposition: Status is: Inpatient Remains inpatient appropriate because:  no d/c plan  Planned Discharge Destination: CIR  DVT prophylaxis.  Lovenox Time spent: 35 min  Author: Desma Maxim, MD 11/07/2022 3:41 PM  For on call review [email or web address].

## 2022-11-08 DIAGNOSIS — J9621 Acute and chronic respiratory failure with hypoxia: Secondary | ICD-10-CM | POA: Diagnosis not present

## 2022-11-08 LAB — GLUCOSE, CAPILLARY
Glucose-Capillary: 126 mg/dL — ABNORMAL HIGH (ref 70–99)
Glucose-Capillary: 198 mg/dL — ABNORMAL HIGH (ref 70–99)
Glucose-Capillary: 136 mg/dL — ABNORMAL HIGH (ref 70–99)
Glucose-Capillary: 101 mg/dL — ABNORMAL HIGH (ref 70–99)

## 2022-11-08 LAB — T3: T3, Total: 57 ng/dL — ABNORMAL LOW (ref 71–180)

## 2022-11-08 MED ORDER — LISINOPRIL 10 MG PO TABS
10.0000 mg | ORAL_TABLET | Freq: Every day | ORAL | Status: DC
  Administered 2022-11-09 – 2022-11-11 (×3): 10 mg via ORAL
  Filled 2022-11-08 (×3): qty 1

## 2022-11-08 MED ORDER — ALPRAZOLAM 0.25 MG PO TABS
0.2500 mg | ORAL_TABLET | Freq: Every evening | ORAL | Status: DC | PRN
  Administered 2022-11-08 – 2022-11-10 (×3): 0.25 mg via ORAL
  Filled 2022-11-08 (×3): qty 1

## 2022-11-08 MED ORDER — INSULIN ASPART 100 UNIT/ML IJ SOLN
0.0000 [IU] | Freq: Three times a day (TID) | INTRAMUSCULAR | Status: DC
  Administered 2022-11-08 – 2022-11-09 (×2): 1 [IU] via SUBCUTANEOUS
  Administered 2022-11-10: 3 [IU] via SUBCUTANEOUS
  Administered 2022-11-11: 1 [IU] via SUBCUTANEOUS
  Administered 2022-11-11: 3 [IU] via SUBCUTANEOUS
  Administered 2022-11-12: 2 [IU] via SUBCUTANEOUS
  Filled 2022-11-08 (×5): qty 1

## 2022-11-08 MED ORDER — CARVEDILOL 3.125 MG PO TABS
3.1250 mg | ORAL_TABLET | Freq: Two times a day (BID) | ORAL | Status: DC
  Administered 2022-11-09 – 2022-11-12 (×7): 3.125 mg via ORAL
  Filled 2022-11-08 (×7): qty 1

## 2022-11-08 NOTE — Progress Notes (Signed)
Progress Note   Patient: Gregory Haynes [id number] DOB: 20-Jun-1941 DOA: 10/28/2022     10 DOS: the patient was seen and examined on 11/08/2022   Brief hospital course:   Progress Note     Patient: Gregory Haynes [id number] DOB: October 23, 1940 DOA: 10/28/2022     7 DOS: the patient was seen and examined on 11/05/2022   Brief hospital course: ICU transfer.   82 y.o. male with medical history significant of chronic respiratory failure on 2 L, COPD, hypertension, hyperlipidemia presenting with acute on chronic respiratory failure with hypoxia and COPD exacerbation.  Patient reports increased work of breathing over the past 3 to 4 days.  Positive wheezing and increased work of breathing.  No reported sick contacts.  Wife reports that her and the patient went on vacation to the beach.  Patient was noncompliant with wearing his oxygen while on vacation.  No fevers or chills.  Quit smoking 11 years ago.  No chest pain.  No nausea or vomiting.  Has had increased O2 requirements as well as increased inhaler use at home.  Wife reports a significant event of COPD exacerbation almost 10 years ago that required and patient being transferred to [facility] because of declining respiratory status.  Presented to the ER afebrile, hemodynamically stable.  Requiring 2 to 3 L to keep O2 sats greater than 92%.  White count 12.5, hemoglobin 14.7, platelets 311.  Creatinine 0.97.  Glucose 191.  Troponin negative.,  COVID flu and RSV negative. RVP negative.   3/19.  Patient down to his baseline 2 L of oxygen.  With nursing staff it took 2 people to move them around.  Patient sleeping in the afternoon on reevaluation.  As per patient's wife he has not been wearing his oxygen as he normally does on vacation and has a very poor diet over the last 4 days. 3/21: More somnolent due to CO2 Narcosis. Transfer to ICU for BiPAP, PCCM consulted.  CT chest concerning for atypical/viral pneumonia, adding on Rocephin. 3/22: Mentation  improved initially, initially tolerating trial off BiPAP.  Later developed severe agitation and delirium requiring Precedex. 3/23: off bipap and oriented this morning. Short of breath during the day, back on BiPAP.   3/24: Patient remained little tachypneic, saturating well on 2 L of oxygen.  Labs with slight worsening of leukocytosis at 12.5, rest stable.  Weaned off from Precedex.  PT was recommending SNF but wife would like to take him back home with home health services.  Patient will also need BiPAP to use at night and while taking naps during the day.   3/25: Patient stable this morning.  Unable to do overnight pulse ox as he desaturated and was placed on BiPAP yesterday evening.  Most likely candidal today.  Wife now agrees for CIR-pending evaluation and insurance authorization.  Repeat chest x-ray today with severe emphysematous changes and no evidence of lobar consolidation.   Pt has Chronic respiratory Failure second to COPD. Needs non-invasive ventilator to reduce hospitalizations and sustain life.    3/26: Hemodynamically stable.  NIV ordered.  Pending CIR evaluation     3/27: stable, awaiting CIR decision  Assessment and Plan: * Acute on chronic respiratory failure with hypoxia ([place]) Back down to his baseline 2 L of oxygen.  Required BiPAP initially. Patient is requiring BiPAP as needed and when sleeping -Continue with supplemental oxygen -Continue with supportive care  COPD exacerbation ([place]) Most likely secondary to atypical pneumonia.  Patient also received Solu-Medrol. Improving.  No wheezing today -Solu-Medrol was switched with prednisone now completing taper last dose tomorrow - approved for niv trilogy through [employer]  CAP (community acquired pneumonia) Concern of atypical infection on imaging.  Strep pneumo and legionella negative.  Completed a course of antibiotics. Repeat chest x-ray with severe emphysematous changes, no consolidation  Toxic metabolic  encephalopathy CO2 narcosis.  Concern of CO2 narcosis versus ICU delirium, requiring Precedex infusion.  Which has been weaned off.  Mental status now appears to be at baseline and patient appears much calmer. -Continue to monitor -Will need BiPAP at home to be used at night and when sleeping, has been approved for NIV - wife requests evening benzo to help with cpap compliance, I agreed to a one-time dose tonight but do not advise continuing it at discharge  Generalized weakness CIR being pursued  Coronary artery disease of native artery of native heart with stable angina pectoris (HCC) Continue Coreg and atorvastatin, given bradycardia will reduce dose of coreg to 3.25  HTN,  Bp low normal today - hold lisinopril, reduce dose coreg  Abnormal TFTs 0.141 on presentation, normalized on re-check. T3 is low t4 is elevated - advised re-check of TFTs 6 weeks after discharge  Other hyperlipidemia Continue Lipitor  Steroid-induced hyperglycemia Improving with weaning steroids, will reduce intensity of SSI    Subjective: Patient was seen and examined today.  No new complaints.  Refused bipap last night  Physical Exam: Vitals:   11/07/22 2358 11/08/22 0420 11/08/22 0728 11/08/22 1119  BP: 100/62 117/68 123/73 (!) 106/56  Pulse: 64 61 76 68  Resp: 18 18 17 20   Temp: 98.2 F (36.8 C) 97.7 F (36.5 C) 98.2 F (36.8 C) 98.2 F (36.8 C)  TempSrc:      SpO2: 96% 94% 95% 95%  Weight:      Height:       General.  Frail elderly man, in no acute distress. Pulmonary.  Lungs clear bilaterally, normal respiratory effort. CV.  Regular rate and rhythm, no JVD, rub or murmur. Abdomen.  Soft, nontender, nondistended, BS positive. CNS.  Alert and oriented .  No focal neurologic deficit. Extremities.  No edema, no cyanosis, pulses intact and symmetrical. Psychiatry.  Judgment and insight appears normal.    Data Reviewed: Prior data reviewed  Family Communication: Discussed with wife at  bedside 3/29  Disposition: Status is: Inpatient Remains inpatient appropriate because:  no d/c plan  Planned Discharge Destination: CIR  DVT prophylaxis.  Lovenox Time spent: 35 min  Author: Desma Maxim, MD 11/08/2022 1:25 PM  For on call review [email or web address].

## 2022-11-08 NOTE — Progress Notes (Signed)
Physical Therapy Treatment Patient Details Name: Gregory Haynes MRN: [id number] DOB: [birth year] Today's Date: 11/08/2022   History of Present Illness Pt is an 49 y 63 M admitted on 10/28/22 after presenting to the ED with c/o progressive SOB over 3-4 days. Pt admitted for tx of COPD exacerbation. Pt transferred to ICU on 10/31/22 for BiPAP & chest CT concerning for atypical/viral PNA. PMH: chronic hypoxic respiratory failure on 2L O2 PRN, COPD, HTN, HLD    PT Comments    Pt was sitting in recliner with supportive spouse at bedside. Pt is alert and agreeable to session. Remains motivated and cooperative. ON 3 L o2 throughout session without desaturation below 88%. Pt does however get SOB with minimal gait distances. He was able to stand and ambulate ~ 100 ft total without using RW. Pt is extremely unsteady and reaches for furniture and hallway railing throughout. He required +4 standing rest during gait. HR dropped form upper 60s to upper 40s during ambulation. MD made aware. Discussed with pt need for using C-pap at night and making better diet choices at DC. Pt states understanding however will need more encouragement going forward. Pt is progressing but still far form his baseline abilities. He will benefit form continued skilled PT at DC to maximize his independence and safety with all ADLs. Acute PT will continue to follow per current POC.     Recommendations for follow up therapy are one component of a multi-disciplinary discharge planning process, led by the attending physician.  Recommendations may be updated based on patient status, additional functional criteria and insurance authorization.     Assistance Recommended at Discharge Intermittent Supervision/Assistance  Patient can return home with the following A little help with walking and/or transfers;A little help with bathing/dressing/bathroom;Assistance with cooking/housework;Direct supervision/assist for financial management;Assist for  transportation;Help with stairs or ramp for entrance   Equipment Recommendations  Other (comment) (Defer to next level of care)       Precautions / Restrictions Precautions Precautions: Fall Restrictions Weight Bearing Restrictions: No     Mobility  Bed Mobility  General bed mobility comments: In recliner pre/post session    Transfers Overall transfer level: Needs assistance Equipment used: None Transfers: Sit to/from Stand Sit to Stand: Min guard  General transfer comment: CGA for transfers without AD. pt quickly reaches for surfaces/ furniture/ railings  in standing without UE support. Pt's balance if far from bvaseline per spouse/patient    Ambulation/Gait Ambulation/Gait assistance: Min guard Gait Distance (Feet): 100 Feet Assistive device: None Gait Pattern/deviations: Decreased step length - left, Decreased step length - right, Decreased stride length, Decreased dorsiflexion - right, Decreased dorsiflexion - left Gait velocity: decreased     General Gait Details: Pt was able to tolerate gait ~ 100 ft however severely limited by fatifue. HR prior to gait 67bpm that dropped to upper 40 BPM during gait. during ambulation 100 ft, pt required 4 standing rest. Encouraged more upright posture and relaxation techniques to control SOB. Pt wa son 3 L o2 throughout session without desaturation however sever SOB noted with prolonged recovery required.   Balance Overall balance assessment: Needs assistance Sitting-balance support: Bilateral upper extremity supported, Feet supported Sitting balance-Leahy Scale: Good Sitting balance - Comments: no LOB in sitting   Standing balance support: No upper extremity supported, During functional activity Standing balance-Leahy Scale: Poor Standing balance comment: pt is at high fall risk without UE support. encouraged continued use of RW when ambulating with RN staff. PT will continue to focus on  imporving activity tolerance and balance  without AD      Cognition Arousal/Alertness: Awake/alert Behavior During Therapy: WFL for tasks assessed/performed Overall Cognitive Status: Within Functional Limits for tasks assessed      General Comments: Pt is alert and cooperative. Per spouse still not at baseline cognition but does seem to be improving throughout admission               Pertinent Vitals/Pain Pain Assessment Pain Assessment: No/denies pain     PT Goals (current goals can now be found in the care plan section) Acute Rehab PT Goals Patient Stated Goal: get better Progress towards PT goals: Progressing toward goals    Frequency    7X/week      PT Plan Current plan remains appropriate    AM-PAC PT "6 Clicks" Mobility   Outcome Measure  Help needed turning from your back to your side while in a flat bed without using bedrails?: A Little Help needed moving from lying on your back to sitting on the side of a flat bed without using bedrails?: A Little Help needed moving to and from a bed to a chair (including a wheelchair)?: A Little Help needed standing up from a chair using your arms (e.g., wheelchair or bedside chair)?: A Little Help needed to walk in hospital room?: A Little Help needed climbing 3-5 steps with a railing? : A Lot 6 Click Score: 17    End of Session Equipment Utilized During Treatment: Oxygen (3L) Activity Tolerance: Patient tolerated treatment well;Patient limited by fatigue Patient left: in chair;with call bell/phone within reach;with chair alarm set;with family/visitor present Nurse Communication: Mobility status PT Visit Diagnosis: Unsteadiness on feet (R26.81);Muscle weakness (generalized) (M62.81);Difficulty in walking, not elsewhere classified (R26.2);Other abnormalities of gait and mobility (R26.89)     Time: [id number] PT Time Calculation (min) (ACUTE ONLY): 20 min  Charges:  $Gait Training: 8-22 mins                    Julaine Fusi PTA 11/08/22, 1:02 PM

## 2022-11-08 NOTE — Progress Notes (Signed)
Inpatient Rehab Admissions Coordinator:    I continue to await insurance auth for CIR.  I have escalated case and was told it was pending MD review.   Clemens Catholic, [place], [place] Admissions Coordinator  [phone] ([place]) [phone] (office)

## 2022-11-08 NOTE — Progress Notes (Signed)
Occupational Therapy Treatment Patient Details Name: Gregory Haynes MRN: [id number] DOB: 06/17/1941 Today's Date: 11/08/2022   History of present illness Pt is an 57 y 56 M admitted on 10/28/22 after presenting to the ED with c/o progressive SOB over 3-4 days. Pt admitted for tx of COPD exacerbation. Pt transferred to ICU on 10/31/22 for BiPAP & chest CT concerning for atypical/viral PNA. PMH: chronic hypoxic respiratory failure on 2L O2 PRN, COPD, HTN, HLD   OT comments  Mr Billick was seen for OT treatment on this date. Upon arrival to room pt reclined in chair, agreeable to tx. Pt requires CGA sit<>stand x4 and ~20 ft mobility, furniture walks. SBA mirror cleaning task standing at sink side, cues for seated rest breaks. SpO2 desat mid 80s on 2L [place] with activity, cues for PLB, improves to 90% with prolonged seated rest. Pt making good progress toward goals, will continue to follow POC. Discharge recommendation remains appropriate.     Recommendations for follow up therapy are one component of a multi-disciplinary discharge planning process, led by the attending physician.  Recommendations may be updated based on patient status, additional functional criteria and insurance authorization.    Assistance Recommended at Discharge Frequent or constant Supervision/Assistance  Patient can return home with the following  Direct supervision/assist for medications management;Direct supervision/assist for financial management;Assistance with cooking/housework;Assist for transportation;Help with stairs or ramp for entrance;A little help with walking and/or transfers;A little help with bathing/dressing/bathroom   Equipment Recommendations  BSC/3in1;Tub/shower seat    Recommendations for Other Services      Precautions / Restrictions Precautions Precautions: Fall Restrictions Weight Bearing Restrictions: No       Mobility Bed Mobility               General bed mobility comments: In recliner  pre/post session    Transfers Overall transfer level: Needs assistance Equipment used: None Transfers: Sit to/from Stand Sit to Stand: Min guard                 Balance Overall balance assessment: Needs assistance Sitting-balance support: No upper extremity supported Sitting balance-Leahy Scale: Good     Standing balance support: No upper extremity supported, During functional activity Standing balance-Leahy Scale: Poor Standing balance comment: lateral LOBs with ni UE support and single leg stance                           ADL either performed or assessed with clinical judgement   ADL Overall ADL's : Needs assistance/impaired                                       General ADL Comments: CGA simulated toilet t/f. SBA mirror cleaning task standing at sink side, cues for seated rest breaks      Cognition Arousal/Alertness: Awake/alert Behavior During Therapy: WFL for tasks assessed/performed Overall Cognitive Status: Within Functional Limits for tasks assessed                                                     Pertinent Vitals/ Pain       Pain Assessment Pain Assessment: No/denies pain   Frequency  Min 3X/week        Progress  Toward Goals  OT Goals(current goals can now be found in the care plan section)  Progress towards OT goals: Progressing toward goals  Acute Rehab OT Goals Patient Stated Goal: to improve breathing OT Goal Formulation: With patient/family Time For Goal Achievement: 11/18/22 Potential to Achieve Goals: Good ADL Goals Pt Will Perform Grooming: Independently;standing Pt Will Perform Lower Body Dressing: Independently;sit to/from stand Pt Will Transfer to Toilet: Independently;ambulating Pt Will Perform Toileting - Clothing Manipulation and hygiene: Independently;sit to/from stand  Plan Discharge plan remains appropriate;Frequency remains appropriate    Co-evaluation                  AM-PAC OT "6 Clicks" Daily Activity     Outcome Measure   Help from another person eating meals?: None Help from another person taking care of personal grooming?: A Little Help from another person toileting, which includes using toliet, bedpan, or urinal?: A Lot Help from another person bathing (including washing, rinsing, drying)?: A Lot Help from another person to put on and taking off regular upper body clothing?: A Little Help from another person to put on and taking off regular lower body clothing?: A Little 6 Click Score: 17    End of Session    OT Visit Diagnosis: Unsteadiness on feet (R26.81);Muscle weakness (generalized) (M62.81)   Activity Tolerance Patient tolerated treatment well;Patient limited by fatigue   Patient Left in chair;with call bell/phone within reach;with chair alarm set   Nurse Communication          Time: [id number] OT Time Calculation (min): 23 min  Charges: OT General Charges $OT Visit: 1 Visit OT Treatments $Self Care/Home Management : 8-22 mins $Therapeutic Activity: 8-22 mins  Dessie Coma, M.S. OTR/L  11/08/22, 3:41 PM  ascom [phone]

## 2022-11-08 NOTE — Inpatient Diabetes Management (Signed)
Inpatient Diabetes Program Recommendations  AACE/ADA: New Consensus Statement on Inpatient Glycemic Control (2015)  Target Ranges:  Prepandial:   less than 140 mg/dL      Peak postprandial:   less than 180 mg/dL (1-2 hours)      Critically ill patients:  140 - 180 mg/dL    Latest Reference Range & Units 11/07/22 08:02 11/07/22 11:41 11/07/22 16:06 11/07/22 21:38 11/07/22 22:21  Glucose-Capillary 70 - 99 mg/dL 183 (H)  4 units Novolog  115 (H) 231 (H)  7 units Novolog  67 (L) 113 (H)  (H): Data is abnormally high (L): Data is abnormally low     Current Orders: Novolog Resistant Correction Scale/ SSI (0-20 units) TID AC + HS    MD- Note pt was started on Novolog SSI while on Steroids  Prednisone dose down to 5 mg Daily  Hypoglycemia yest at 9pm after receiving 7 units Novolog SSI at 4pm  Please consider reducing the Novolog SSI to the 0-9 unit scale    --Will follow patient during hospitalization--  Wyn Quaker RN, MSN, [place] Diabetes Coordinator Inpatient Glycemic Control Team Team Pager: [phone] (8a-5p)

## 2022-11-08 NOTE — TOC Progression Note (Signed)
Transition of Care [facility]) - Progression Note    Patient Details  Name: Gregory Haynes MRN: [id number] Date of Birth: Oct 16, 1940  [facility] CM/SW Contact  Laurena Slimmer, RN Phone Number: 11/08/2022, 4:00 PM  Clinical Narrative:    CIR auth pending.  No update on insurance auth currently available.         Expected Discharge Plan and Services                                               Social Determinants of Health (SDOH) Interventions SDOH Screenings   Food Insecurity: No Food Insecurity (10/29/2022)  Housing: Low Risk  (10/29/2022)  Transportation Needs: Unmet Transportation Needs (10/29/2022)  Utilities: Not At Risk (10/29/2022)  Alcohol Screen: Low Risk  (07/01/2022)  Depression (PHQ2-9): Low Risk  (07/01/2022)  Financial Resource Strain: Low Risk  (06/24/2018)  Physical Activity: Unknown (07/01/2019)  Social Connections: Unknown (06/24/2018)  Stress: No Stress Concern Present (07/01/2019)  Tobacco Use: Medium Risk (10/28/2022)    Readmission Risk Interventions     No data to display

## 2022-11-08 NOTE — Care Management Important Message (Signed)
Important Message  Patient Details  Name: Gregory Haynes MRN: [id number] Date of Birth: 1940/10/29   Medicare Important Message Given:  Yes     [name] 11/08/2022, 1:00 PM

## 2022-11-08 NOTE — Consult Note (Signed)
Physical Medicine and Rehabilitation Consult Reason for Consult: Cardiac debility Referring Physician: Gwynne Edinger, MD   HPI: Gregory Haynes is a 82 y.o. male with a past medical history of chronic respiratory failure on 2L of O2, COPD, HTN, and HLD, who presented with acute on chronic respiratory failure with hypoxia and COPD exacerbation. Course was complicated by CO2 narcoses which required transfer to the ICU for BiPAP. CT was concerning for a viral pneumonia and Rocephin was added. He later developed severe agitation and delirium requiring Precedex. Now improved but with impaired mobility and ADLs. Physical Medicine & Rehabilitation was consulted to assess candidacy for CIR.     ROS: denies shortness of breath at rest while receiving O2 Past Medical History:  Diagnosis Date   Anemia    Cancer ([place])    BCC   Cataract    COPD (chronic obstructive pulmonary disease) ([place])    Helicobacter pylori ab+    Hyperlipidemia    Hypertension    Psoriasis    Wears hearing aid in both ears    Past Surgical History:  Procedure Laterality Date   CATARACT EXTRACTION W/PHACO Left 08/18/2019   Procedure: CATARACT EXTRACTION PHACO AND INTRAOCULAR LENS PLACEMENT (IOC) LEFT 6.68,   00:54.1,   30.8%;  Surgeon: Leandrew Koyanagi, MD;  Location: [place];  Service: Ophthalmology;  Laterality: Left;   COLONOSCOPY     Family History  Problem Relation Age of Onset   Heart attack Father    Heart disease Brother    Hyperlipidemia Brother    Alcohol abuse Brother    Social History:  reports that he quit smoking about 11 years ago. His smoking use included cigarettes. He has a 82.50 pack-year smoking history. He has never used smokeless tobacco. He reports current alcohol use. He reports that he does not use drugs. Allergies: No Known Allergies Medications Prior to Admission  Medication Sig Dispense Refill   acidophilus (RISAQUAD) CAPS capsule Take 1 capsule by mouth daily.      albuterol (VENTOLIN HFA) 108 (90 Base) MCG/ACT inhaler Inhale 1-2 puffs into the lungs every 4 (four) hours as needed for wheezing or shortness of breath. 18 g 3   atorvastatin (LIPITOR) 40 MG tablet Take 1 tablet (40 mg total) by mouth every evening. 90 tablet 3   carvedilol (COREG) 12.5 MG tablet Take 1 tablet (12.5 mg total) by mouth 2 (two) times daily with a meal. 180 tablet 3   clobetasol cream (TEMOVATE) [id number] % Apply 1 Application topically 2 (two) times daily.     Fluticasone-Umeclidin-Vilant (TRELEGY ELLIPTA) 100-62.5-25 MCG/INH AEPB Inhale 1 puff into the lungs daily.     folic acid (FOLVITE) 1 MG tablet Take 1 mg by mouth daily. Unsure dose     lisinopril (ZESTRIL) 10 MG tablet Take 1 tablet (10 mg total) by mouth daily. 90 tablet 3   OXYGEN Inhale 2 L/hr into the lungs. At night and as needed during day.      Home: Home Living Family/patient expects to be discharged to:: Private residence Living Arrangements: Spouse/significant other Available Help at Discharge: Family, Available 24 hours/day Type of Home: House Home Access: Stairs to enter [employer] of Steps: 4 Entrance Stairs-Rails: Left Home Layout: One level Bathroom Shower/Tub: Chiropodist: Standard Bathroom Accessibility: Yes Home Equipment: Rollator (4 wheels) Additional Comments: home O2  Lives With: Spouse  Functional History: Prior Function Prior Level of Function : Independent/Modified Independent, Driving Mobility Comments: sometimes uses  rollator for transporting his home O2 ADLs Comments: Independent ADLs/IADLs Functional Status:  Mobility: Bed Mobility Overal bed mobility: Needs Assistance Bed Mobility: Supine to Sit Supine to sit: Min assist, HOB elevated General bed mobility comments: In recliner pre/post session Transfers Overall transfer level: Needs assistance Equipment used: None Transfers: Sit to/from Stand Sit to Stand: Min guard Bed to/from  chair/wheelchair/BSC transfer type:: Step pivot Step pivot transfers: Min assist (bed>recliner with min assist, cuing to square up to recliner & reach back for armrests during stand>sit.) General transfer comment: CGA for transfers without AD. pt quickly reaches for surfaces/ furniture/ railings  in standing without UE support. Pt's balance if far from bvaseline per spouse/patient Ambulation/Gait Ambulation/Gait assistance: Min guard Gait Distance (Feet): 100 Feet Assistive device: None Gait Pattern/deviations: Decreased step length - left, Decreased step length - right, Decreased stride length, Decreased dorsiflexion - right, Decreased dorsiflexion - left General Gait Details: Pt was able to tolerate gait ~ 100 ft however severely limited by fatifue. HR prior to gait 67bpm that dropped to upper 40 BPM during gait. during ambulation 100 ft, pt required 4 standing rest. Encouraged more upright posture and relaxation techniques to control SOB. Pt wa son 3 L o2 throughout session without desaturation however sever SOB noted with prolonged recovery required. Gait velocity: decreased    ADL: ADL Overall ADL's : Needs assistance/impaired Grooming: Min guard, Standing, Wash/dry face Grooming Details (indicate cue type and reason): limited standing tolerance, <5 min Toilet Transfer: Rolling walker (2 wheels), Minimal assistance, Moderate assistance Toilet Transfer Details (indicate cue type and reason): simulated Toileting- Clothing Manipulation and Hygiene: Sit to/from stand, Minimal assistance Toileting - Clothing Manipulation Details (indicate cue type and reason): anticipate Functional mobility during ADLs: Min guard, Minimal assistance, Rolling walker (2 wheels) (~10 ft to bedside sink) General ADL Comments: Pt continues to be functionally limited due to generalized weakness and decreased endurance. He fatigued quickly this date and required one seated rest break.  Cognition: Cognition Overall  Cognitive Status: Within Functional Limits for tasks assessed Orientation Level: Oriented X4 Cognition Arousal/Alertness: Awake/alert Behavior During Therapy: WFL for tasks assessed/performed Overall Cognitive Status: Within Functional Limits for tasks assessed General Comments: Pt is alert and cooperative. Per spouse still not at baseline cognition but does seem to be improving throughout admission  Blood pressure (!) 106/56, pulse 68, temperature 98.2 F (36.8 C), resp. rate 20, height 5\' 11"  (1.803 m), weight 86.3 kg, SpO2 95 %. Physical Exam Gen: no distress, normal appearing HEENT: oral mucosa pink and moist, NCAT Cardio: Reg rate Chest: normal effort, normal rate of breathing Abd: soft, non-distended Ext: no edema Psych: pleasant, normal affect Skin: intact Neuro: Alert and oriented without focal deficits MSK: diffuse weakness  Results for orders placed or performed during the hospital encounter of 10/28/22 (from the past 24 hour(s))  Glucose, capillary     Status: Abnormal   Collection Time: 11/07/22  4:06 PM  Result Value Ref Range   Glucose-Capillary 231 (H) 70 - 99 mg/dL  Glucose, capillary     Status: Abnormal   Collection Time: 11/07/22  9:38 PM  Result Value Ref Range   Glucose-Capillary 67 (L) 70 - 99 mg/dL  Glucose, capillary     Status: Abnormal   Collection Time: 11/07/22 10:21 PM  Result Value Ref Range   Glucose-Capillary 113 (H) 70 - 99 mg/dL  Glucose, capillary     Status: Abnormal   Collection Time: 11/08/22  7:38 AM  Result Value Ref Range  Glucose-Capillary 126 (H) 70 - 99 mg/dL  Glucose, capillary     Status: Abnormal   Collection Time: 11/08/22 11:21 AM  Result Value Ref Range   Glucose-Capillary 198 (H) 70 - 99 mg/dL   No results found.  Assessment/Plan: Diagnosis: Cardiac debility Does the need for close, 24 hr/day medical supervision in concert with the patient's rehab needs make it unreasonable for this patient to be served in a less  intensive setting? Yes Co-Morbidities requiring supervision/potential complications:  1) COPD : continue O2 2) HTN: reduced coreg 3) HLD: conitue lipitor 4) Hyperglycemia 5) CAD Due to bladder management, bowel management, safety, skin/wound care, disease management, medication administration, pain management, and patient education, does the patient require 24 hr/day rehab nursing? Yes Does the patient require coordinated care of a physician, rehab nurse, therapy disciplines of PT, OT to address physical and functional deficits in the context of the above medical diagnosis(es)? Yes Addressing deficits in the following areas: balance, endurance, locomotion, strength, transferring, bowel/bladder control, bathing, dressing, feeding, grooming, and toileting Can the patient actively participate in an intensive therapy program of at least 3 hrs of therapy per day at least 5 days per week? Yes The potential for patient to make measurable gains while on inpatient rehab is excellent Anticipated functional outcomes upon discharge from inpatient rehab are modified independent  with PT, modified independent with OT, independent with SLP. Estimated rehab length of stay to reach the above functional goals is: 5-7 days Anticipated discharge destination: Home Overall Rehab/Functional Prognosis: excellent  POST ACUTE RECOMMENDATIONS: This patient's condition is appropriate for continued rehabilitative care in the following setting: CIR Patient has agreed to participate in recommended program. Yes Note that insurance prior authorization may be required for reimbursement for recommended care.    I have personally performed a face to face diagnostic evaluation of this patient. Additionally, I have examined the patient's medical record including any pertinent labs and radiographic images. If the physician assistant has documented in this note, I have reviewed and edited or otherwise concur with the physician  assistant's documentation.  Thanks,  Izora Ribas, MD 11/08/2022

## 2022-11-09 DIAGNOSIS — J9621 Acute and chronic respiratory failure with hypoxia: Secondary | ICD-10-CM | POA: Diagnosis not present

## 2022-11-09 LAB — GLUCOSE, CAPILLARY
Glucose-Capillary: 102 mg/dL — ABNORMAL HIGH (ref 70–99)
Glucose-Capillary: 148 mg/dL — ABNORMAL HIGH (ref 70–99)
Glucose-Capillary: 94 mg/dL (ref 70–99)
Glucose-Capillary: 246 mg/dL — ABNORMAL HIGH (ref 70–99)

## 2022-11-09 NOTE — Progress Notes (Signed)
Patient given Xanax at 2058pm prior to putting BIPAP on. BIPAP on at 2200pm. Patient removed Bipap at 0010am. Patient stated that he needed a break. Bipap replaced at 0030am. Patient removed Bipap at 0330pm. 2 Liters oxygen by nasal cannula replaced. Patient resting in bed. Patient Sat on side of bed at 0400am. Patient was awake until 0445am. Patient refused to put BIPAP back on. When asked patient stated "Are you kidding. No." Patient educated on benefits. Patient continues to refuse. Patient resting in bed, call light within reach, bed in lowest position, denies needs at this time.

## 2022-11-09 NOTE — Progress Notes (Signed)
Occupational Therapy Treatment Patient Details Name: Gregory Haynes MRN: [id number] DOB: [birth year] Today's Date: 11/09/2022   History of present illness Pt is an 71 y 45 M admitted on 10/28/22 after presenting to the ED with c/o progressive SOB over 3-4 days. Pt admitted for tx of COPD exacerbation. Pt transferred to ICU on 10/31/22 for BiPAP & chest CT concerning for atypical/viral PNA. PMH: chronic hypoxic respiratory failure on 2L O2 PRN, COPD, HTN, HLD   OT comments  Patient received sitting up in recliner and agreeable to OT. Spouse present. Pt deferred grooming and toileting tasks this date. Tx session focused on BUE/LE AROM exercises for strengthening (see details below) and dynamic standing activities. Pt c/o SOB during session. Min VC required for pursed lip breathing and seated rest breaks provided t/o. Pt on 2L O2 via [place]. Pt left as received with all needs in reach. Pt is making progress toward goal completion. OT will continue to follow acutely.     Recommendations for follow up therapy are one component of a multi-disciplinary discharge planning process, led by the attending physician.  Recommendations may be updated based on patient status, additional functional criteria and insurance authorization.    Assistance Recommended at Discharge Frequent or constant Supervision/Assistance  Patient can return home with the following  Direct supervision/assist for medications management;Direct supervision/assist for financial management;Assistance with cooking/housework;Assist for transportation;Help with stairs or ramp for entrance;A little help with walking and/or transfers;A little help with bathing/dressing/bathroom   Equipment Recommendations  BSC/3in1;Tub/shower seat    Recommendations for Other Services      Precautions / Restrictions Precautions Precautions: Fall Restrictions Weight Bearing Restrictions: No       Mobility Bed Mobility               General bed  mobility comments: NT, in recliner pre/post session    Transfers Overall transfer level: Needs assistance Equipment used: None Transfers: Sit to/from Stand Sit to Stand: Min guard                 Balance Overall balance assessment: Needs assistance Sitting-balance support: No upper extremity supported Sitting balance-Leahy Scale: Good     Standing balance support: During functional activity, Bilateral upper extremity supported, Single extremity supported Standing balance-Leahy Scale: Fair Standing balance comment: Pt engaging in forward reaching targeting improved dynamic standing balance. Single UE from RW then no UE support, Min guard for safety.       ADL either performed or assessed with clinical judgement   ADL Overall ADL's : Needs assistance/impaired                         Toilet Transfer: Rolling walker (2 wheels);Min guard Toilet Transfer Details (indicate cue type and reason): simulated           General ADL Comments: Pt deferred grooming and toileting tasks this date.    Extremity/Trunk Assessment Upper Extremity Assessment Upper Extremity Assessment: Generalized weakness   Lower Extremity Assessment Lower Extremity Assessment: Generalized weakness   Cervical / Trunk Assessment Cervical / Trunk Assessment: Normal    Vision Baseline Vision/History: 1 Wears glasses Patient Visual Report: No change from baseline     Perception     Praxis      Cognition Arousal/Alertness: Awake/alert Behavior During Therapy: WFL for tasks assessed/performed Overall Cognitive Status: Within Functional Limits for tasks assessed          Exercises General Exercises - Upper Extremity Shoulder Flexion: AROM, Both,  10 reps, Seated Elbow Flexion: AROM, Both, 10 reps, Seated Elbow Extension: AROM, Both, 10 reps, Seated Chair Push Up: AROM, Strengthening, Both, 10 reps, Seated General Exercises - Lower Extremity Hip Flexion/Marching: AROM, Both, 20  reps, Standing    Shoulder Instructions       General Comments Pt on 2L O2 via [place] t/o session, SpO2 reading 85% after activity likely due to poor pleth. Min VC provided for PLB and pt given seated rest breaks t/o.    Pertinent Vitals/ Pain       Pain Assessment Pain Assessment: No/denies pain  Home Living              Prior Functioning/Environment              Frequency  Min 2X/week        Progress Toward Goals  OT Goals(current goals can now be found in the care plan section)  Progress towards OT goals: Progressing toward goals  Acute Rehab OT Goals Patient Stated Goal: to improve breathing OT Goal Formulation: With patient/family Time For Goal Achievement: 11/18/22 Potential to Achieve Goals: Good  Plan Discharge plan needs to be updated;Frequency needs to be updated    Co-evaluation                 AM-PAC OT "6 Clicks" Daily Activity     Outcome Measure   Help from another person eating meals?: None Help from another person taking care of personal grooming?: A Little Help from another person toileting, which includes using toliet, bedpan, or urinal?: A Lot Help from another person bathing (including washing, rinsing, drying)?: A Lot Help from another person to put on and taking off regular upper body clothing?: A Little Help from another person to put on and taking off regular lower body clothing?: A Little 6 Click Score: 17    End of Session Equipment Utilized During Treatment: Gait belt;Rolling walker (2 wheels);Oxygen  OT Visit Diagnosis: Unsteadiness on feet (R26.81);Muscle weakness (generalized) (M62.81)   Activity Tolerance Patient tolerated treatment well;Patient limited by fatigue   Patient Left in chair;with call bell/phone within reach;with chair alarm set   Nurse Communication Mobility status        Time: [id number] OT Time Calculation (min): 20 min  Charges: OT General Charges $OT Visit: 1 Visit OT  Treatments $Therapeutic Exercise: 8-22 mins  [facility] MS, OTR/L ascom [phone]  11/09/22, 6:40 PM

## 2022-11-09 NOTE — TOC Progression Note (Signed)
Transition of Care [facility]) - Progression Note    Patient Details  Name: Gregory Haynes MRN: [id number] Date of Birth: [birth year]  Transition of [facility]) CM/SW Contact  Izola Price, RN Phone Number: 11/09/2022, 2:38 PM  Clinical Narrative:  3/30: RN CM spoke with spouse, Gregory Haynes, regarding [employer] denial for CIR and next steps for discharge plan. She wishes to take patient home with [facility] and will obtain PCS for 24 hour assistance/supervision. She feels she will need a sliding shower board/chair that fits over tub wall, a BSC, and other DME therapy may recommend. Prior CM note of 3/27 indicated that a NIV trilogy was authorized by insurance via [place] per [facility]. Current chronic oxygen at home is via [place]. No preference for [facility] agency. Was also referred to [facility] before CIR process began. Will need to check on NIV trilogy status before discharge as spouse was unaware of any set up processes. Patient is not tolerating CPAP well in acute setting per spouse. Will update provider on conversation with spouse re discharge plan. TOC will follow through discharge process, contact [employer], and obtain [place] based on provider and/or therapy recommendations.   No private vehicle for transportation on discharge per PT.   Simmie Davies RN CM        Expected Discharge Plan and Services                                               Social Determinants of Health (SDOH) Interventions SDOH Screenings   Food Insecurity: No Food Insecurity (10/29/2022)  Housing: Low Risk  (10/29/2022)  Transportation Needs: Unmet Transportation Needs (10/29/2022)  Utilities: Not At Risk (10/29/2022)  Alcohol Screen: Low Risk  (07/01/2022)  Depression (PHQ2-9): Low Risk  (07/01/2022)  Financial Resource Strain: Low Risk  (06/24/2018)  Physical Activity: Unknown (07/01/2019)  Social Connections: Unknown (06/24/2018)  Stress: No Stress Concern Present (07/01/2019)  Tobacco Use: Medium Risk (10/28/2022)     Readmission Risk Interventions     No data to display

## 2022-11-09 NOTE — Progress Notes (Signed)
Mobility Specialist - Progress Note     11/09/22 1713  Mobility  Activity Ambulated with assistance to bathroom;Stood at bedside;Dangled on edge of bed  Level of Assistance Contact guard assist, steadying assist  Assistive Device Front wheel walker  Distance Ambulated (ft) 10 ft  Range of Motion/Exercises Active  Activity Response Tolerated well  Mobility Referral Yes  $Mobility charge 1 Mobility    [employer] Mobility Specialist 11/09/22, 5:15 PM

## 2022-11-09 NOTE — Progress Notes (Signed)
PROGRESS NOTE    Gregory Haynes  [id number]  DOB: 18-Mar-1941  DOA: 10/28/2022 PCP: Gregory Post, MD Outpatient Specialists:   Hospital course:  82 y.o. male with medical history significant of chronic respiratory failure on 2 L, COPD, hypertension, hyperlipidemia presenting with acute on chronic respiratory failure with hypoxia and COPD exacerbation.  Patient reports increased work of breathing over the past 3 to 4 days.  Positive wheezing and increased work of breathing.  No reported sick contacts.  Wife reports that her and the patient went on vacation to the beach.  Patient was noncompliant with wearing his oxygen while on vacation.  No fevers or chills.  Quit smoking 11 years ago.  No chest pain.  No nausea or vomiting.  Has had increased O2 requirements as well as increased inhaler use at home.  Wife reports a significant event of COPD exacerbation almost 10 years ago that required and patient being transferred to [facility] because of declining respiratory status.  Presented to the ER afebrile, hemodynamically stable.  Requiring 2 to 3 L to keep O2 sats greater than 92%.  White count 12.5, hemoglobin 14.7, platelets 311.  Creatinine 0.97.  Glucose 191.  Troponin negative.,  COVID flu and RSV negative. RVP negative.   3/19.  Patient down to his baseline 2 L of oxygen.  With nursing staff it took 2 people to move them around.  Patient sleeping in the afternoon on reevaluation.  As per patient's wife he has not been wearing his oxygen as he normally does on vacation and has a very poor diet over the last 4 days. 3/21: More somnolent due to CO2 Narcosis. Transfer to ICU for BiPAP, PCCM consulted.  CT chest concerning for atypical/viral pneumonia, adding on Rocephin. 3/22: Mentation improved initially, initially tolerating trial off BiPAP.  Later developed severe agitation and delirium requiring Precedex. 3/23: off bipap and oriented this morning. Short of breath during the day, back on  BiPAP.   3/24: Patient remained little tachypneic, saturating well on 2 L of oxygen.  Labs with slight worsening of leukocytosis at 12.5, rest stable.  Weaned off from Precedex.  PT was recommending SNF but wife would like to take him back home with home health services.  Patient will also need BiPAP to use at night and while taking naps during the day.   3/25: Patient stable this morning.  Unable to do overnight pulse ox as he desaturated and was placed on BiPAP yesterday evening.  Most likely candidal today.  Wife now agrees for CIR-pending evaluation and insurance authorization.  Repeat chest x-ray today with severe emphysematous changes and no evidence of lobar consolidation.   Pt has Chronic respiratory Failure second to COPD. Needs non-invasive ventilator to reduce hospitalizations and sustain life.    3/26: Hemodynamically stable.  NIV ordered.  Pending CIR evaluation      3/27: stable, awaiting CIR decision  3/30: Awaiting insurance authorization for CIR   Subjective:  Patient was sleeping and awoke easily, said he was doing fine.  Had no complaints.  Denies shortness of breath.   Objective: Vitals:   11/09/22 1146 11/09/22 1148 11/09/22 1149 11/09/22 1600  BP:   100/60 113/61  Pulse: 85 85 75 61  Resp:   18 19  Temp:   98.3 F (36.8 C) 98 F (36.7 C)  TempSrc:   Oral   SpO2: 91% 91% 97% 95%  Weight:      Height:  Intake/Output Summary (Last 24 hours) at 11/09/2022 1650 Last data filed at 11/09/2022 1031 Gross per 24 hour  Intake 240 ml  Output 250 ml  Net -10 ml   Filed Weights   10/29/22 0156  Weight: 86.3 kg     Exam:  General: Patient sleeping peacefully in no respiratory distress, awoke easily to voice and was oriented Eyes: sclera anicteric, conjuctiva mild injection bilaterally CVS: S1-S2, regular  Respiratory:  decreased air entry bilaterally secondary to decreased inspiratory effort, rales at bases  GI: NABS, soft, NT  LE: Warm and  well-perfused  Data Reviewed:  Basic Metabolic Panel: Recent Labs  Lab 11/03/22 0436 11/04/22 0331  NA 142 141  K 4.9 4.7  CL 94* 94*  CO2 36* 38*  GLUCOSE 168* 134*  BUN 33* 38*  CREATININE 0.81 0.80  CALCIUM 8.7* 8.9    CBC: Recent Labs  Lab 11/03/22 0436 11/04/22 0331  WBC 12.5* 11.0*  HGB 13.9 14.2  HCT 44.5 45.7  MCV 96.1 96.6  PLT 304 297     Scheduled Meds:  atorvastatin  40 mg Oral QPM   carvedilol  3.125 mg Oral BID WC   enoxaparin (LOVENOX) injection  40 mg Subcutaneous Q24H   feeding supplement  237 mL Oral TID   insulin aspart  0-5 Units Subcutaneous QHS   insulin aspart  0-9 Units Subcutaneous TID WC   ipratropium-albuterol  3 mL Nebulization BID   lisinopril  10 mg Oral Daily   polyethylene glycol  17 g Oral Daily   Continuous Infusions:  sodium chloride 10 mL/hr at 11/04/22 1600     Assessment & Plan:    Acute on chronic respiratory failure COPD CAP Patient has been treated for CAP and COPD flare with antibiotics, steroids, inhaled bronchodilators and as needed BiPAP Patient has completed his steroid taper Is on his baseline 2 L oxygen and usual maintenance bronchodilators  Toxic metabolic encephalopathy, now resolved Patient had previously been on Precedex for possible ICU delirium And is now back to normal mental status  HTN BP is low normal on present doses of lisinopril and carvedilol     Copied from previous note: Generalized weakness CIR being pursued   Coronary artery disease of native artery of native heart with stable angina pectoris (HCC) Continue Coreg and atorvastatin       DVT prophylaxis: Lovenox Code Status: Full     Studies: No results found.  Principal Problem:   Acute on chronic respiratory failure with hypoxia (HCC) Active Problems:   COPD exacerbation (HCC)   CAP (community acquired pneumonia)   Toxic metabolic encephalopathy   Generalized weakness   Coronary artery disease of native artery  of native heart with stable angina pectoris ([facility])   HTN, goal below 130/80   Other hyperlipidemia   Memory loss   CO2 narcosis     Gregory Haynes Gregory Haynes, [facility]  If 7PM-7AM, please contact night-coverage [email or web address]   LOS: 11 days

## 2022-11-09 NOTE — Progress Notes (Signed)
Inpatient Rehab Admissions Coordinator:    I received a denial from [facility] for CIR admit. Discussed with pt. Wife. She does not wish to appeal. Considering SNF vs HH. I let TOC know. CIR will sign off.   Clemens Catholic, [place], [place] Admissions Coordinator  [phone] ([place]) [phone] (office)

## 2022-11-09 NOTE — Progress Notes (Signed)
Mobility Specialist - Progress Note   Pre-mobility: SpO2 (96) During mobility: SpO2 (87) on 2L ; Temporarily bumped to 3L (90) Post-mobility: SPO2 (back on 2L 93)    11/09/22 1700  Mobility  Activity Ambulated with assistance in hallway;Stood at bedside;Dangled on edge of bed  Level of Assistance Contact guard assist, steadying assist  Assistive Device Front wheel walker  Distance Ambulated (ft) 180 ft  Range of Motion/Exercises Active  Activity Response Tolerated well  Mobility Referral Yes  $Mobility charge 1 Mobility   Pt resting in bed on 2L upon entry. Pt STS and ambulates to hallway CGA with AD. Pt took one standing rest break in hallway and returned to recliner. Pt left with needs in reach and chair alarm activated.   Loma Sender Mobility Specialist 11/09/22, 5:07 PM

## 2022-11-09 NOTE — Progress Notes (Signed)
Physical Therapy Treatment Patient Details Name: MACIE SCHAER MRN: [id number] DOB: October 20, 1940 Today's Date: 11/09/2022   History of Present Illness Pt is an 51 y 46 M admitted on 10/28/22 after presenting to the ED with c/o progressive SOB over 3-4 days. Pt admitted for tx of COPD exacerbation. Pt transferred to ICU on 10/31/22 for BiPAP & chest CT concerning for atypical/viral PNA. PMH: chronic hypoxic respiratory failure on 2L O2 PRN, COPD, HTN, HLD    PT Comments    Patient tolerated session well today. Upon chart review, PT noted that insurance denied CIR. Spoke with family and they are trying to decide between home health and SNF rehab. Wife was concerned about patient coming home and being able to safely get into home as they have 4 steps to enter house. She was also concerned about his ability to walk around their home. Today's session focused on stair negotiation and walking. Patient was instructed to negotiate 4 steps (in stairwell). He was instructed to ascend/descend sideways with both hands on left rail as he only has left rail at home. He was able to negotiate steps well with minimal assist; vitals monitored and Spo2 level was 91%, HR 85 after negotiating steps. He was also able to progress gait today to 200 feet with RW with 3 standing rest breaks. Patient on 2L O2 throughout session, Spo2 91% or greater throughout session. He did experience a drop in HR to 60BPM after walking 200 feet which quickly rebounded back to low 80's with short seated rest break. Spoke with patient and wife and educated them that based on his activity today and home set up that PT believes he should be able to go home with home health services. Recommended wife speak with social worker to see what her options are. Patient/wife will follow up with social worker regarding discharge plan. PT updated rehab recommendations. Patient would benefit from additional skilled PT intervention to improve strength, balance and  gait safety;    Recommendations for follow up therapy are one component of a multi-disciplinary discharge planning process, led by the attending physician.  Recommendations may be updated based on patient status, additional functional criteria and insurance authorization.  Follow Up Recommendations  Can patient physically be transported by private vehicle: No    Assistance Recommended at Discharge Intermittent Supervision/Assistance  Patient can return home with the following A little help with walking and/or transfers;A little help with bathing/dressing/bathroom;Assistance with cooking/housework;Direct supervision/assist for financial management;Assist for transportation;Help with stairs or ramp for entrance   Equipment Recommendations  Other (comment)    Recommendations for Other Services       Precautions / Restrictions Precautions Precautions: Fall Restrictions Weight Bearing Restrictions: No     Mobility  Bed Mobility Overal bed mobility: Modified Independent Bed Mobility: Supine to Sit     Supine to sit: Modified independent (Device/Increase time), HOB elevated          Transfers Overall transfer level: Needs assistance Equipment used: None Transfers: Sit to/from Stand Sit to Stand: Min guard           General transfer comment: Pt requires CGA for sit<>stand transfers without AD. He demonstrates good safety awareness pushing from recliner arm rests and then will reach for other furniture/railings in standing for steady    Ambulation/Gait Ambulation/Gait assistance: Min guard Gait Distance (Feet): 200 Feet Assistive device: Rolling walker (2 wheels)   Gait velocity: 1.24 feet/sec with RW Gait velocity interpretation: <1.31 ft/sec, indicative of household ambulator  General Gait Details: Pt ambulated 200 feet in hallway with 3 standing rest breaks using RW with therapist pushing portable O2 tank. Patient required cues to increase upright posture, improve  pursed lip breathing for better vital response. Patient ambulates with reciprocal gait pattern, decreased heel strike with less DF at initial contact. Vitals monitored, HR dropped from 82bpm to 60 bpm after walking 200 feet, Spo2 remained >92% while on 2L O2.   Stairs Stairs: Yes Stairs assistance: Min assist Stair Management: One rail Left Number of Stairs: 4 General stair comments: Pt ascend/descend 4 steps with left railing, sideways keeping both hands on railing. He required min assistance for safety and verbal cues for sequencing; negotiated steps 2 feet per step;   Wheelchair Mobility    Modified Rankin (Stroke Patients Only)       Balance Overall balance assessment: Needs assistance Sitting-balance support: No upper extremity supported Sitting balance-Leahy Scale: Good     Standing balance support: During functional activity, Bilateral upper extremity supported Standing balance-Leahy Scale: Poor Standing balance comment: uses RW for ambulation and requires 2 HHA on railing when negotiating steps;                            Cognition Arousal/Alertness: Awake/alert Behavior During Therapy: WFL for tasks assessed/performed Overall Cognitive Status: Within Functional Limits for tasks assessed                                          Exercises      General Comments General comments (skin integrity, edema, etc.): Pt on 2L O2 throughout session, Spo2 91% or greater throughout session; HR remained in low 80's throughout session until after walking 200 feet HR dropped to 60 BPM for short period but rebounded back to 80's within 1 min of slow steady breathing.      Pertinent Vitals/Pain Pain Assessment Pain Assessment: No/denies pain    Home Living                          Prior Function            PT Goals (current goals can now be found in the care plan section) Acute Rehab PT Goals Patient Stated Goal: get better PT Goal  Formulation: With patient Time For Goal Achievement: 11/17/22 Potential to Achieve Goals: Good Progress towards PT goals: Progressing toward goals    Frequency    Min 2X/week      PT Plan Discharge plan needs to be updated;Frequency needs to be updated    Co-evaluation              AM-PAC PT "6 Clicks" Mobility   Outcome Measure  Help needed turning from your back to your side while in a flat bed without using bedrails?: A Little Help needed moving from lying on your back to sitting on the side of a flat bed without using bedrails?: A Little Help needed moving to and from a bed to a chair (including a wheelchair)?: A Little Help needed standing up from a chair using your arms (e.g., wheelchair or bedside chair)?: A Little Help needed to walk in hospital room?: A Little Help needed climbing 3-5 steps with a railing? : A Little 6 Click Score: 18    End of Session Equipment Utilized During Treatment: Oxygen;Gait  belt Activity Tolerance: Patient tolerated treatment well;Patient limited by fatigue Patient left: in chair;with call bell/phone within reach;with chair alarm set;with family/visitor present Nurse Communication: Mobility status PT Visit Diagnosis: Unsteadiness on feet (R26.81);Muscle weakness (generalized) (M62.81);Difficulty in walking, not elsewhere classified (R26.2);Other abnormalities of gait and mobility (R26.89)     Time: [id number] PT Time Calculation (min) (ACUTE ONLY): 46 min  Charges:  $Gait Training: 23-37 mins $Therapeutic Activity: 8-22 mins                       Yu Cragun PT, DPT 11/09/2022, 12:21 PM

## 2022-11-10 DIAGNOSIS — J9621 Acute and chronic respiratory failure with hypoxia: Secondary | ICD-10-CM | POA: Diagnosis not present

## 2022-11-10 LAB — GLUCOSE, CAPILLARY
Glucose-Capillary: 104 mg/dL — ABNORMAL HIGH (ref 70–99)
Glucose-Capillary: 231 mg/dL — ABNORMAL HIGH (ref 70–99)
Glucose-Capillary: 93 mg/dL (ref 70–99)
Glucose-Capillary: 120 mg/dL — ABNORMAL HIGH (ref 70–99)

## 2022-11-10 NOTE — Progress Notes (Addendum)
Patient is not able to walk the distance required to go the bathroom, or he/she is unable to safely negotiate stairs required to access the bathroom due to medical condition, fall risk, and on continuous oxygen.  A  BSC will alleviate this problem

## 2022-11-10 NOTE — TOC Progression Note (Addendum)
Transition of Care [facility]) - Progression Note    Patient Details  Name: HAWARD BIESINGER MRN: [id number] Date of Birth: 09/16/1940  Transition of Care [facility]) CM/SW Contact  Izola Price, RN Phone Number: 11/10/2022, 1:27 PM  Clinical Narrative:  3/31: Updated provider on ins authorization and requested [facility] and DME orders with needs requested by spouse. Contacted Ashley at [place] for NIV set up for potential discharge Monday 11/11/22. He will reach out to weekday CM Monday am as well as spouse for set up time. Simmie Davies RN CM   325 pm [facility] accepted for [facility] services and adjustments requested of provider to be PT/OT/RN/SW. DME orders in and placed via [employer] for Monday delivery, but [employer] will check when contact spouse. Simmie Davies [facility]         Expected Discharge Plan and Services                                               Social Determinants of Health (SDOH) Interventions SDOH Screenings   Food Insecurity: No Food Insecurity (10/29/2022)  Housing: Low Risk  (10/29/2022)  Transportation Needs: Unmet Transportation Needs (10/29/2022)  Utilities: Not At Risk (10/29/2022)  Alcohol Screen: Low Risk  (07/01/2022)  Depression (PHQ2-9): Low Risk  (07/01/2022)  Financial Resource Strain: Low Risk  (06/24/2018)  Physical Activity: Unknown (07/01/2019)  Social Connections: Unknown (06/24/2018)  Stress: No Stress Concern Present (07/01/2019)  Tobacco Use: Medium Risk (10/28/2022)    Readmission Risk Interventions     No data to display

## 2022-11-10 NOTE — Progress Notes (Signed)
Wife called; spoke with patient. Patient now agreeable to wearing bipap. Replaced mask; will continue to monitor use.

## 2022-11-10 NOTE — Progress Notes (Signed)
Patient refusing bipap at this time. 2L [place] applied.

## 2022-11-10 NOTE — Progress Notes (Signed)
PROGRESS NOTE    Gregory Haynes  [id number]  DOB: [birth year]  DOA: 10/28/2022 PCP: Eulas Post, MD Outpatient Specialists:   Hospital course:  82 y.o. male with medical history significant of chronic respiratory failure on 2 L, COPD, hypertension, hyperlipidemia presenting with acute on chronic respiratory failure with hypoxia and COPD exacerbation.  Patient reports increased work of breathing over the past 3 to 4 days.  Positive wheezing and increased work of breathing.  No reported sick contacts.  Wife reports that her and the patient went on vacation to the beach.  Patient was noncompliant with wearing his oxygen while on vacation.  No fevers or chills.  Quit smoking 11 years ago.  No chest pain.  No nausea or vomiting.  Has had increased O2 requirements as well as increased inhaler use at home.  Wife reports a significant event of COPD exacerbation almost 10 years ago that required and patient being transferred to [facility] because of declining respiratory status.  Presented to the ER afebrile, hemodynamically stable.  Requiring 2 to 3 L to keep O2 sats greater than 92%.  White count 12.5, hemoglobin 14.7, platelets 311.  Creatinine 0.97.  Glucose 191.  Troponin negative.,  COVID flu and RSV negative. RVP negative.   3/19.  Patient down to his baseline 2 L of oxygen.  With nursing staff it took 2 people to move them around.  Patient sleeping in the afternoon on reevaluation.  As per patient's wife he has not been wearing his oxygen as he normally does on vacation and has a very poor diet over the last 4 days. 3/21: More somnolent due to CO2 Narcosis. Transfer to ICU for BiPAP, PCCM consulted.  CT chest concerning for atypical/viral pneumonia, adding on Rocephin. 3/22: Mentation improved initially, initially tolerating trial off BiPAP.  Later developed severe agitation and delirium requiring Precedex. 3/23: off bipap and oriented this morning. Short of breath during the day, back on  BiPAP.   3/24: Patient remained little tachypneic, saturating well on 2 L of oxygen.  Labs with slight worsening of leukocytosis at 12.5, rest stable.  Weaned off from Precedex.  PT was recommending SNF but wife would like to take him back home with home health services.  Patient will also need BiPAP to use at night and while taking naps during the day.   3/25: Patient stable this morning.  Unable to do overnight pulse ox as he desaturated and was placed on BiPAP yesterday evening.  Most likely candidal today.  Wife now agrees for CIR-pending evaluation and insurance authorization.  Repeat chest x-ray today with severe emphysematous changes and no evidence of lobar consolidation.   Pt has Chronic respiratory Failure second to COPD. Needs non-invasive ventilator to reduce hospitalizations and sustain life.    3/26: Hemodynamically stable.  NIV ordered.  Pending CIR evaluation      3/27: stable, awaiting CIR decision  3/30: Awaiting insurance authorization for CIR  3/31: CIR authorization was declined, patient is to go home with home health.  Patient is stable to go home however home health cannot be set up until tomorrow-plan for discharge tomorrow.   Subjective:  Patient was sleeping and awoke easily, said he was doing fine.  Had no complaints.  Denies shortness of breath.   Objective: Vitals:   11/09/22 2350 11/10/22 0001 11/10/22 0444 11/10/22 0728  BP:  106/68 107/66 113/64  Pulse:  73 75 70  Resp:  20 18 18   Temp:  98.1 F (36.7  C) 97.6 F (36.4 C) 98.5 F (36.9 C)  TempSrc:    Oral  SpO2: 94% 99% 94% 94%  Weight:      Height:        Intake/Output Summary (Last 24 hours) at 11/10/2022 1601 Last data filed at 11/10/2022 1430 Gross per 24 hour  Intake 0 ml  Output --  Net 0 ml    Filed Weights   10/29/22 0156  Weight: 86.3 kg     Exam:  General: Sitting up in recliner with breakfast in front of him.  States he is enjoyed speaking with his daughter and is  waiting for his wife to arrive.  Notes he feels okay and is at baseline. Eyes: sclera anicteric, conjuctiva mild injection bilaterally CVS: S1-S2, regular  Respiratory:  decreased air entry bilaterally secondary to decreased inspiratory effort, rales at bases  GI: NABS, soft, NT  LE: Warm and well-perfused  Data Reviewed:  Basic Metabolic Panel: Recent Labs  Lab 11/04/22 0331  NA 141  K 4.7  CL 94*  CO2 38*  GLUCOSE 134*  BUN 38*  CREATININE 0.80  CALCIUM 8.9     CBC: Recent Labs  Lab 11/04/22 0331  WBC 11.0*  HGB 14.2  HCT 45.7  MCV 96.6  PLT 297      Scheduled Meds:  atorvastatin  40 mg Oral QPM   carvedilol  3.125 mg Oral BID WC   enoxaparin (LOVENOX) injection  40 mg Subcutaneous Q24H   feeding supplement  237 mL Oral TID   insulin aspart  0-5 Units Subcutaneous QHS   insulin aspart  0-9 Units Subcutaneous TID WC   ipratropium-albuterol  3 mL Nebulization BID   lisinopril  10 mg Oral Daily   polyethylene glycol  17 g Oral Daily   Continuous Infusions:  sodium chloride 10 mL/hr at 11/04/22 1600     Assessment & Plan:   Disposition Patient has been awaiting insurance authorizations for CIR Authorization was denied today, patient is to go home with home health Home health could not be set up today, plan is for patient to be discharged home tomorrow.  Acute on chronic respiratory failure COPD CAP Patient has been treated for CAP and COPD flare with antibiotics, steroids, inhaled bronchodilators and as needed BiPAP Patient has completed his steroid taper Is on his baseline 2 L oxygen and usual maintenance bronchodilators  Toxic metabolic encephalopathy, now resolved Patient had previously been on Precedex for possible ICU delirium And is now back to normal mental status  HTN BP is low normal on present doses of lisinopril and carvedilol     Copied from previous note: Generalized weakness CIR being pursued   Coronary artery disease of  native artery of native heart with stable angina pectoris (HCC) Continue Coreg and atorvastatin       DVT prophylaxis: Lovenox Code Status: Full     Studies: No results found.  Principal Problem:   Acute on chronic respiratory failure with hypoxia (HCC) Active Problems:   COPD exacerbation (HCC)   CAP (community acquired pneumonia)   Toxic metabolic encephalopathy   Generalized weakness   Coronary artery disease of native artery of native heart with stable angina pectoris ([place])   HTN, goal below 130/80   Other hyperlipidemia   Memory loss   CO2 narcosis     Katee Wentland Derek Jack, [facility]  If 7PM-7AM, please contact night-coverage [email or web address]   LOS: 12 days

## 2022-11-10 NOTE — Progress Notes (Signed)
Mobility Specialist - Progress Note   Pre-mobility: SpO2 (94) During mobility:  SpO2 (90) Post-mobility: SPO2(91)   11/10/22 1534  Mobility  Activity Ambulated with assistance in hallway;Stood at bedside  Level of Assistance Standby assist, set-up cues, supervision of patient - no hands on  Assistive Device Front wheel walker  Distance Ambulated (ft) 160 ft  Activity Response Tolerated well  Mobility Referral Yes  $Mobility charge 1 Mobility   Pt resting in recliner on 2L upon entry. Pt STS and ambulates to hallway around NS SBA for AD. Pt returned to recliner and left with needs in reach and bed alarm activated.   Loma Sender Mobility Specialist 11/10/22, 4:05 PM

## 2022-11-11 DIAGNOSIS — J9621 Acute and chronic respiratory failure with hypoxia: Secondary | ICD-10-CM | POA: Diagnosis not present

## 2022-11-11 LAB — CBC WITH DIFFERENTIAL/PLATELET
Abs Immature Granulocytes: 0.12 10*3/uL — ABNORMAL HIGH (ref 0.00–0.07)
Basophils Absolute: 0 10*3/uL (ref 0.0–0.1)
Basophils Relative: 0 %
Eosinophils Absolute: 0.1 10*3/uL (ref 0.0–0.5)
Eosinophils Relative: 1 %
HCT: 39.4 % (ref 39.0–52.0)
Hemoglobin: 12.4 g/dL — ABNORMAL LOW (ref 13.0–17.0)
Immature Granulocytes: 1 %
Lymphocytes Relative: 8 %
Lymphs Abs: 1.1 10*3/uL (ref 0.7–4.0)
MCH: 30.3 pg (ref 26.0–34.0)
MCHC: 31.5 g/dL (ref 30.0–36.0)
MCV: 96.3 fL (ref 80.0–100.0)
Monocytes Absolute: 2 10*3/uL — ABNORMAL HIGH (ref 0.1–1.0)
Monocytes Relative: 15 %
Neutro Abs: 9.9 10*3/uL — ABNORMAL HIGH (ref 1.7–7.7)
Neutrophils Relative %: 75 %
Platelets: 307 10*3/uL (ref 150–400)
RBC: 4.09 MIL/uL — ABNORMAL LOW (ref 4.22–5.81)
RDW: 13.4 % (ref 11.5–15.5)
WBC: 13.2 10*3/uL — ABNORMAL HIGH (ref 4.0–10.5)
nRBC: 0 % (ref 0.0–0.2)

## 2022-11-11 LAB — BASIC METABOLIC PANEL
Anion gap: 9 (ref 5–15)
BUN: 31 mg/dL — ABNORMAL HIGH (ref 8–23)
CO2: 36 mmol/L — ABNORMAL HIGH (ref 22–32)
Calcium: 8.6 mg/dL — ABNORMAL LOW (ref 8.9–10.3)
Chloride: 92 mmol/L — ABNORMAL LOW (ref 98–111)
Creatinine, Ser: 0.87 mg/dL (ref 0.61–1.24)
GFR, Estimated: >60 mL/min (ref >60)
Glucose, Bld: 136 mg/dL — ABNORMAL HIGH (ref 70–99)
Potassium: 4.4 mmol/L (ref 3.5–5.1)
Sodium: 137 mmol/L (ref 135–145)

## 2022-11-11 LAB — CREATININE, SERUM
Creatinine, Ser: 0.85 mg/dL (ref 0.61–1.24)
GFR, Estimated: >60 mL/min (ref >60)

## 2022-11-11 LAB — GLUCOSE, CAPILLARY
Glucose-Capillary: 121 mg/dL — ABNORMAL HIGH (ref 70–99)
Glucose-Capillary: 239 mg/dL — ABNORMAL HIGH (ref 70–99)
Glucose-Capillary: 96 mg/dL (ref 70–99)
Glucose-Capillary: 113 mg/dL — ABNORMAL HIGH (ref 70–99)

## 2022-11-11 MED ORDER — SODIUM CHLORIDE 0.9 % IV SOLN: INTRAVENOUS | Status: AC

## 2022-11-11 NOTE — Progress Notes (Addendum)
PROGRESS NOTE    Gregory Haynes  [id number]  DOB: April 07, 1941  DOA: 10/28/2022 PCP: Eulas Post, MD Outpatient Specialists:   Hospital course:  82 y.o. male with medical history significant of chronic respiratory failure on 2 L, COPD, hypertension, hyperlipidemia presenting with acute on chronic respiratory failure with hypoxia and COPD exacerbation.  Patient reports increased work of breathing over the past 3 to 4 days.  Positive wheezing and increased work of breathing.  No reported sick contacts.  Wife reports that her and the patient went on vacation to the beach.  Patient was noncompliant with wearing his oxygen while on vacation.  No fevers or chills.  Quit smoking 11 years ago.  No chest pain.  No nausea or vomiting.  Has had increased O2 requirements as well as increased inhaler use at home.  Wife reports a significant event of COPD exacerbation almost 10 years ago that required and patient being transferred to [facility] because of declining respiratory status.  Presented to the ER afebrile, hemodynamically stable.  Requiring 2 to 3 L to keep O2 sats greater than 92%.  White count 12.5, hemoglobin 14.7, platelets 311.  Creatinine 0.97.  Glucose 191.  Troponin negative.,  COVID flu and RSV negative. RVP negative.   3/19.  Patient down to his baseline 2 L of oxygen.  With nursing staff it took 2 people to move them around.  Patient sleeping in the afternoon on reevaluation.  As per patient's wife he has not been wearing his oxygen as he normally does on vacation and has a very poor diet over the last 4 days. 3/21: More somnolent due to CO2 Narcosis. Transfer to ICU for BiPAP, PCCM consulted.  CT chest concerning for atypical/viral pneumonia, adding on Rocephin. 3/22: Mentation improved initially, initially tolerating trial off BiPAP.  Later developed severe agitation and delirium requiring Precedex. 3/23: off bipap and oriented this morning. Short of breath during the day, back on  BiPAP.   3/24: Patient remained little tachypneic, saturating well on 2 L of oxygen.  Labs with slight worsening of leukocytosis at 12.5, rest stable.  Weaned off from Precedex.  PT was recommending SNF but wife would like to take him back home with home health services.  Patient will also need BiPAP to use at night and while taking naps during the day.   3/25: Patient stable this morning.  Unable to do overnight pulse ox as he desaturated and was placed on BiPAP yesterday evening.  Most likely candidal today.  Wife now agrees for CIR-pending evaluation and insurance authorization.  Repeat chest x-ray today with severe emphysematous changes and no evidence of lobar consolidation.   Pt has Chronic respiratory Failure second to COPD. Needs non-invasive ventilator to reduce hospitalizations and sustain life.    3/26: Hemodynamically stable.  NIV ordered.  Pending CIR evaluation      3/27: stable, awaiting CIR decision  3/30: Awaiting insurance authorization for CIR  3/31: CIR authorization was declined, patient is to go home with home health.  Patient is stable to go home however home health cannot be set up until tomorrow-plan for discharge tomorrow.  4/1: Plan was for discharge today however patient is noted to be markedly hypotensive of unclear etiology.  Echocardiogram ordered   Subjective:  Patient sitting in chair happy to go home.  Notes that he really does not like CPAP and is anxious about wearing mask.  Thinks Xanax might help a little bit but he really does not want  to use the CPAP.  Notes he has a pulmonologist at [facility] and he plans on discussing this with her.   Objective: Vitals:   11/11/22 0828 11/11/22 1141 11/11/22 1334 11/11/22 1532  BP: (!) 111/58 (!) 89/53 (!) 91/48 113/66  Pulse: 69 92 83 74  Resp: 20 18 18 20   Temp: 98.1 F (36.7 C) 98.8 F (37.1 C)  98.4 F (36.9 C)  TempSrc: Oral Oral  Oral  SpO2: 97% 96% 96% 98%  Weight:      Height:        Intake/Output  Summary (Last 24 hours) at 11/11/2022 1603 Last data filed at 11/11/2022 1100 Gross per 24 hour  Intake 480 ml  Output --  Net 480 ml    Filed Weights   10/29/22 0156  Weight: 86.3 kg     Exam:  General: Sitting up in recliner in NAD, in good spirits Eyes: sclera anicteric, conjuctiva mild injection bilaterally CVS: S1-S2, regular  Respiratory:  decreased air entry bilaterally secondary to decreased inspiratory effort, rales at bases  GI: NABS, soft, NT  LE: Warm and well-perfused  Data Reviewed:  Basic Metabolic Panel: Recent Labs  Lab 11/11/22 0529 11/11/22 1442  NA  --  137  K  --  4.4  CL  --  92*  CO2  --  36*  GLUCOSE  --  136*  BUN  --  31*  CREATININE 0.85 0.87  CALCIUM  --  8.6*     CBC: Recent Labs  Lab 11/11/22 1442  WBC 13.2*  NEUTROABS 9.9*  HGB 12.4*  HCT 39.4  MCV 96.3  PLT 307      Scheduled Meds:  atorvastatin  40 mg Oral QPM   carvedilol  3.125 mg Oral BID WC   enoxaparin (LOVENOX) injection  40 mg Subcutaneous Q24H   feeding supplement  237 mL Oral TID   insulin aspart  0-5 Units Subcutaneous QHS   insulin aspart  0-9 Units Subcutaneous TID WC   ipratropium-albuterol  3 mL Nebulization BID   lisinopril  10 mg Oral Daily   polyethylene glycol  17 g Oral Daily   Continuous Infusions:  sodium chloride 10 mL/hr at 11/04/22 1600     Assessment & Plan:   Hypotension Patient is markedly hypotensive today of unclear etiology BP has slowly but steadily been declining over the past week without change in medications. States he feels okay, no chest pain fevers chills or malaise Labs are within normal with stable H&H Will discontinue lisinopril 10, continue carvedilol 3.125 Will order echocardiogram and hydrate gently with normal saline 75 cc an hour x 10 hours.  Disposition Plan was to discharge patient today after authorization for CIR was declined yesterday. However given new hypotension, patient was kept in house to work that  up. Patient is stable from a respiratory standpoint and his encephalopathy is clearly resolved.  Acute on chronic respiratory failure COPD CAP Patient has been treated for CAP and COPD flare with antibiotics, steroids, inhaled bronchodilators and as needed BiPAP Patient notes she really does not like to use BiPAP, notes it makes him anxious Thinks that Xanax might be helpful but is not sure He will discuss use of BiPAP with his pulmonologist at [facility] Patient has completed his steroid taper Is on his baseline 2 L oxygen and usual maintenance bronchodilators  Toxic metabolic encephalopathy, now resolved Patient had previously been on Precedex for possible ICU delirium And is now back to normal mental status  CAD Continue Coreg and atorvastatin       DVT prophylaxis: Lovenox Code Status: Full     Studies: No results found.  Principal Problem:   Acute on chronic respiratory failure with hypoxia Active Problems:   COPD exacerbation   CAP (community acquired pneumonia)   Toxic metabolic encephalopathy   Generalized weakness   Coronary artery disease of native artery of native heart with stable angina pectoris   HTN, goal below 130/80   Other hyperlipidemia   Memory loss   CO2 narcosis     Kinzlie Harney Derek Jack, [facility]  If 7PM-7AM, please contact night-coverage [email or web address]   LOS: 13 days

## 2022-11-11 NOTE — Progress Notes (Signed)
Patient again refusing bipap at this time. 2L [place] o2 applied.

## 2022-11-11 NOTE — TOC Progression Note (Addendum)
Transition of Care [facility]) - Progression Note    Patient Details  Name: Gregory Haynes MRN: [id number] Date of Birth: [birth year]  [facility]) CM/SW Contact  Laurena Slimmer, RN Phone Number: 11/11/2022, 9:13 AM  Clinical Narrative:    Retrieved call from Caryl Pina at [facility]. NIV prepared for delivery for anticipated discharge.   Per Cyril Mourning at [employer] including a RW, Sliding bench for tub and BSC will be delivered.   11:30pm Spoke with patient's wife at bedside. She stated she was told patient was going home today. Patient will require EMS. Patient informed someone will have to be at home for for deliveries to be made.  She left to go home.  1:51pm Discharge pended until tomorrow due to hypotension per MD.    2:30pm Spoke with Coralyn Mark from [facility]. Several unsuccessful attempts made to reach patient's spouse today. Patient's spouse number verified.   3:00pm Spoke with Mrs. Milkey she was advised discharge was on hold and delivered would be made tomorrow. She was contacted by Cyril Mourning from [facility] as well and advised of delivery tomorrow. Per Coralyn Mark at [facility] NIV will be delivered tomorrow.           Expected Discharge Plan and Services                                               Social Determinants of Health (SDOH) Interventions SDOH Screenings   Food Insecurity: No Food Insecurity (10/29/2022)  Housing: Low Risk  (10/29/2022)  Transportation Needs: Unmet Transportation Needs (10/29/2022)  Utilities: Not At Risk (10/29/2022)  Alcohol Screen: Low Risk  (07/01/2022)  Depression (PHQ2-9): Low Risk  (07/01/2022)  Financial Resource Strain: Low Risk  (06/24/2018)  Physical Activity: Unknown (07/01/2019)  Social Connections: Unknown (06/24/2018)  Stress: No Stress Concern Present (07/01/2019)  Tobacco Use: Medium Risk (10/28/2022)    Readmission Risk Interventions     No data to display

## 2022-11-11 NOTE — Progress Notes (Signed)
Patient remains restless, sitting on side of bed. Remains on [place] 2L at this time. Continues to refuse BiPAP. Patient is pleasant, alert, and oriented x3.

## 2022-11-11 NOTE — Care Management Important Message (Signed)
Important Message  Patient Details  Name: Gregory Haynes MRN: [id number] Date of Birth: 03/26/1941   Medicare Important Message Given:  Yes     [name] 11/11/2022, 12:50 PM

## 2022-11-11 NOTE — Progress Notes (Signed)
Physical Therapy Treatment Patient Details Name: Gregory Haynes MRN: [id number] DOB: 1940/09/03 Today's Date: 11/11/2022   History of Present Illness Pt is an 43 y 17 M admitted on 10/28/22 after presenting to the ED with c/o progressive SOB over 3-4 days. Pt admitted for tx of COPD exacerbation. Pt transferred to ICU on 10/31/22 for BiPAP & chest CT concerning for atypical/viral PNA. PMH: chronic hypoxic respiratory failure on 2L O2 PRN, COPD, HTN, HLD    PT Comments    Pt resting in recliner upon PT arrival; pt reporting being hungry (breakfast tray had not arrived yet) but pt agreeable to walking with therapist.  During session pt SBA with transfers using RW and CGA to SBA ambulating 240 feet with RW use (1 brief standing rest break to check HR and O2 sats).  O2 sats 91% or greater on 2 L O2 via nasal cannula during sessions activities.  Deferred further activity d/t pt's breakfast tray arriving.  Will continue to focus on strengthening, endurance, and progressive functional mobility during hospitalization.    Recommendations for follow up therapy are one component of a multi-disciplinary discharge planning process, led by the attending physician.  Recommendations may be updated based on patient status, additional functional criteria and insurance authorization.  Follow Up Recommendations  Can patient physically be transported by private vehicle: Yes    Assistance Recommended at Discharge Intermittent Supervision/Assistance  Patient can return home with the following A little help with walking and/or transfers;A little help with bathing/dressing/bathroom;Assistance with cooking/housework;Direct supervision/assist for financial management;Assist for transportation;Help with stairs or ramp for entrance   Equipment Recommendations  Rolling walker (2 wheels);BSC/3in1    Recommendations for Other Services       Precautions / Restrictions Precautions Precautions: Fall Restrictions Weight  Bearing Restrictions: No     Mobility  Bed Mobility               General bed mobility comments: Deferred (pt in recliner beginning/end of session)    Transfers Overall transfer level: Needs assistance Equipment used: Rolling walker (2 wheels) Transfers: Sit to/from Stand Sit to Stand: Supervision           General transfer comment: x1 trial from recliner and x1 trial (standing) from toilet; mild increased effort to stand but steady and safe with use of RW    Ambulation/Gait Ambulation/Gait assistance: Min guard, Supervision Gait Distance (Feet): 240 Feet Assistive device: Rolling walker (2 wheels)   Gait velocity: mildly decreased     General Gait Details: 1 brief standing rest break to check HR and O2 sats; occasional vc's for upright posture; step through gait pattern; steady with RW use   Stairs             Wheelchair Mobility    Modified Rankin (Stroke Patients Only)       Balance Overall balance assessment: Needs assistance Sitting-balance support: No upper extremity supported Sitting balance-Leahy Scale: Good Sitting balance - Comments: steady sitting reaching within BOS   Standing balance support: During functional activity, Bilateral upper extremity supported Standing balance-Leahy Scale: Good Standing balance comment: steady ambulating with RW use                            Cognition Arousal/Alertness: Awake/alert Behavior During Therapy: WFL for tasks assessed/performed Overall Cognitive Status: Within Functional Limits for tasks assessed  Exercises      General Comments  Nursing cleared pt for participation in physical therapy.  Pt agreeable to PT session.      Pertinent Vitals/Pain Pain Assessment Pain Assessment: No/denies pain HR WFL during sessions activities.    Home Living                          Prior Function            PT Goals  (current goals can now be found in the care plan section) Acute Rehab PT Goals Patient Stated Goal: get better PT Goal Formulation: With patient Time For Goal Achievement: 11/17/22 Potential to Achieve Goals: Good Progress towards PT goals: Progressing toward goals    Frequency    Min 2X/week      PT Plan Current plan remains appropriate    Co-evaluation              AM-PAC PT "6 Clicks" Mobility   Outcome Measure  Help needed turning from your back to your side while in a flat bed without using bedrails?: A Little Help needed moving from lying on your back to sitting on the side of a flat bed without using bedrails?: A Little Help needed moving to and from a bed to a chair (including a wheelchair)?: A Little Help needed standing up from a chair using your arms (e.g., wheelchair or bedside chair)?: A Little Help needed to walk in hospital room?: A Little Help needed climbing 3-5 steps with a railing? : A Little 6 Click Score: 18    End of Session Equipment Utilized During Treatment: Oxygen;Gait belt Activity Tolerance: Patient tolerated treatment well Patient left: in chair;with call bell/phone within reach;with chair alarm set Nurse Communication: Mobility status PT Visit Diagnosis: Unsteadiness on feet (R26.81);Muscle weakness (generalized) (M62.81);Difficulty in walking, not elsewhere classified (R26.2);Other abnormalities of gait and mobility (R26.89)     Time: [id number] PT Time Calculation (min) (ACUTE ONLY): 28 min  Charges:  $Therapeutic Exercise: 8-22 mins $Therapeutic Activity: 8-22 mins                     Leitha Bleak, PT 11/11/22, 10:25 AM

## 2022-11-12 ENCOUNTER — Inpatient Hospital Stay (HOSPITAL_COMMUNITY)
Admit: 2022-11-12 | Discharge: 2022-11-12 | Disposition: A | Discharge status: Home / Self Care | Payer: Medicare HMO | Attending: Internal Medicine | Admitting: Internal Medicine

## 2022-11-12 DIAGNOSIS — J189 Pneumonia, unspecified organism: Secondary | ICD-10-CM | POA: Diagnosis not present

## 2022-11-12 DIAGNOSIS — R531 Weakness: Secondary | ICD-10-CM | POA: Diagnosis not present

## 2022-11-12 DIAGNOSIS — J9621 Acute and chronic respiratory failure with hypoxia: Secondary | ICD-10-CM | POA: Diagnosis not present

## 2022-11-12 DIAGNOSIS — J449 Chronic obstructive pulmonary disease, unspecified: Secondary | ICD-10-CM | POA: Diagnosis not present

## 2022-11-12 DIAGNOSIS — I9589 Other hypotension: Secondary | ICD-10-CM | POA: Diagnosis not present

## 2022-11-12 DIAGNOSIS — R413 Other amnesia: Secondary | ICD-10-CM | POA: Diagnosis not present

## 2022-11-12 DIAGNOSIS — J441 Chronic obstructive pulmonary disease with (acute) exacerbation: Secondary | ICD-10-CM | POA: Diagnosis not present

## 2022-11-12 LAB — ECHOCARDIOGRAM COMPLETE
AR max vel: 1.88 cm2
AV Area VTI: 2.81 cm2
AV Area mean vel: 1.89 cm2
AV Mean grad: 8 mmHg
AV Peak grad: 15.2 mmHg
Ao pk vel: 1.95 m/s
Area-P 1/2: 2.16 cm2
Height: 71 in
MV VTI: 3.29 cm2
S' Lateral: 2.4 cm
Weight: 3044.8 oz

## 2022-11-12 LAB — GLUCOSE, CAPILLARY
Glucose-Capillary: 104 mg/dL — ABNORMAL HIGH (ref 70–99)
Glucose-Capillary: 191 mg/dL — ABNORMAL HIGH (ref 70–99)

## 2022-11-12 MED ORDER — ENSURE ENLIVE PO LIQD: 237.0000 mL | Freq: Three times a day (TID) | ORAL | 12 refills | Status: AC

## 2022-11-12 MED ORDER — CARVEDILOL 3.125 MG PO TABS: 3.1250 mg | ORAL_TABLET | Freq: Two times a day (BID) | ORAL | 1 refills | Status: AC

## 2022-11-12 MED ORDER — BENZONATATE 100 MG PO CAPS
200.0000 mg | ORAL_CAPSULE | Freq: Once | ORAL | Status: AC
  Administered 2022-11-12: 200 mg via ORAL
  Filled 2022-11-12: qty 2

## 2022-11-12 MED ORDER — LISINOPRIL 2.5 MG PO TABS: 2.5000 mg | ORAL_TABLET | Freq: Every day | ORAL | 0 refills | Status: AC

## 2022-11-12 NOTE — Progress Notes (Addendum)
DC instructions, med list, COPD info provided and reviewed with pt and SO.  Pt has own oxygen to be discharged with.  Tele monitor and IV removed.  Pt has all belongings, including 2 hearing aids.  Pt in wheelchair to be discharged and wife states his portable tank has not been working since PTA.  Pt placed back on our oxygen.  Plan is for the DME company to deliver a new portable tank to the hospital and then follow them home to review the new equipment at the house.  Spoke with the DME company who states that they are on their way to the hospital at this time.  Pt and SO updated.

## 2022-11-12 NOTE — Discharge Instructions (Addendum)
Use your oxygen, inhalers and NIV as before. keep log of your blood pressure at home. Hold blood pressure medication if systolic blood pressure is less than 120 mmHg.

## 2022-11-12 NOTE — Discharge Summary (Signed)
Physician Discharge Summary   Patient: Gregory Haynes MRN: [id number] DOB: 1940/10/15  Admit date:     10/28/2022  Discharge date: 11/12/22  Discharge Physician: Fritzi Mandes   PCP: Eulas Post, MD   Recommendations at discharge:   follow-up PCP in 1 to 2 weeks Use your oxygen,NIV and nebulizer per instruction follow-up pulmonary Dr.Aleskerov in 1-2 weeks  Discharge Diagnoses: Principal Problem:   Acute on chronic respiratory failure with hypoxia Active Problems:   COPD exacerbation   CAP (community acquired pneumonia)   Toxic metabolic encephalopathy   Generalized weakness   Coronary artery disease of native artery of native heart with stable angina pectoris   HTN, goal below 130/80   Other hyperlipidemia   Memory loss   CO2 narcosis  82 y.o. male with medical history significant of chronic respiratory failure on 2 L, COPD, hypertension, hyperlipidemia presenting with acute on chronic respiratory failure with hypoxia and COPD exacerbation.  Patient reports increased work of breathing over the past 3 to 4 days  patient required ICU stay for BiPAP. Currently of BiPAP on chronic 2 L nasal cannula oxygen.  Acute on chronic respiratory failure COPD CAP --Patient has been treated for CAP and COPD flare with antibiotics, steroids, inhaled bronchodilators and as needed BiPAP --Patient has completed his steroid taper --Is on his baseline 2 L oxygen and usual maintenance bronchodilators -- will follow-up with pulmonologist as outpatient   Toxic metabolic encephalopathy, now resolved --Patient had previously been on Precedex for possible ICU delirium and is now back to normal mental status   HTN --BP is low normal on present doses of lisinopril and carvedilol--dose changed to lower at present. Tolerating it ok.  -- Defer to PCP to adjust BP meds at home.   Generalized weakness -- patient ambulating with mobility therapist about  160 feet. Home health PT  arranged.  Coronary artery disease of native artery of native heart with stable angina pectoris (HCC) --Continue Coreg and atorvastatin   charge plan discussed with patient's wife at bedside. Patient is eager and anxious to go home. Appreciate of the care.     DVT prophylaxis: Lovenox Code Status: Full            Consultants: pulmonary Procedures performed: none  Disposition: Home health Diet recommendation:  Discharge Diet Orders (From admission, onward)     Start     Ordered   11/12/22 0000  Diet - low sodium heart healthy        11/12/22 1216           Cardiac diet DISCHARGE MEDICATION: Allergies as of 11/12/2022   No Known Allergies      Medication List     TAKE these medications    acidophilus Caps capsule Take 1 capsule by mouth daily.   albuterol 108 (90 Base) MCG/ACT inhaler Commonly known as: VENTOLIN HFA Inhale 1-2 puffs into the lungs every 4 (four) hours as needed for wheezing or shortness of breath.   atorvastatin 40 MG tablet Commonly known as: LIPITOR Take 1 tablet (40 mg total) by mouth every evening.   carvedilol 3.125 MG tablet Commonly known as: COREG Take 1 tablet (3.125 mg total) by mouth 2 (two) times daily with a meal. What changed:  medication strength how much to take   clobetasol cream 0.05 % Commonly known as: TEMOVATE Apply 1 Application topically 2 (two) times daily.   feeding supplement Liqd Take 237 mLs by mouth 3 (three) times daily.   folic acid  1 MG tablet Commonly known as: FOLVITE Take 1 mg by mouth daily. Unsure dose   lisinopril 2.5 MG tablet Commonly known as: ZESTRIL Take 1 tablet (2.5 mg total) by mouth daily. What changed:  medication strength how much to take   OXYGEN Inhale 2 L/hr into the lungs. At night and as needed during day.   Trelegy Ellipta 100-62.5-25 MCG/INH Aepb Generic drug: Fluticasone-Umeclidin-Vilant Inhale 1 puff into the lungs daily.               Durable Medical  Equipment  (From admission, onward)           Start     Ordered   11/11/22 1707  For home use only DME Bedside commode  Once       Question:  Patient needs a bedside commode to treat with the following condition  Answer:  COPD with acute exacerbation   11/10/22 1707   11/10/22 1606  For home use only DME oxygen  Once       Question Answer Comment  Length of Need Lifetime   Mode or (Route) Nasal cannula   Liters per Minute 2   Oxygen delivery system Gas      11/10/22 1605   11/10/22 1341  For home use only DME Tub bench  Once        11/10/22 1342   11/10/22 1340  For home use only DME 3 n 1  Once        11/10/22 1342   11/10/22 1340  For home use only DME Walker rolling  Once       Question Answer Comment  Walker: With [place]   Patient needs a walker to treat with the following condition Gait instability      11/10/22 1342            Follow-up Information     Eulas Post, MD. Schedule an appointment as soon as possible for a visit in 1 week(s).   Specialty: Family Medicine Contact information: [address] [place] [address] [phone]         Ottie Glazier, MD. Schedule an appointment as soon as possible for a visit in 1 week(s).   Specialty: Pulmonary Disease Why: COPD flare f/u Contact information: [place] [address] [phone]                Discharge Exam: Danley Danker Weights   10/29/22 0156  Weight: 86.3 kg     Condition at discharge: fair  The results of significant diagnostics from this hospitalization (including imaging, microbiology, ancillary and laboratory) are listed below for reference.   Imaging Studies: DG Chest 2 View  Result Date: 11/04/2022 CLINICAL DATA:  Shortness of breath. EXAM: CHEST - 2 VIEW COMPARISON:  November 02, 2022 FINDINGS: Cardiomediastinal silhouette is normal. Mediastinal contours appear intact. Severe emphysematous changes.  No evidence of lobar  consolidation. Osseous structures are without acute abnormality. Soft tissues are grossly normal. IMPRESSION: 1. Severe emphysematous changes. 2. No evidence of lobar consolidation. Electronically Signed   By: Fidela Salisbury M.D.   On: 11/04/2022 12:20   DG Chest Port 1 View  Result Date: 11/02/2022 CLINICAL DATA:  Shortness of breath, on BiPAP EXAM: PORTABLE CHEST 1 VIEW COMPARISON:  Portable exam 1158 hours compared to 11/01/2022 FINDINGS: Normal heart size, mediastinal contours, and pulmonary vascularity. Atherosclerotic calcification aorta. Bronchitic changes with scattered interstitial infiltrates bilaterally, slightly greater on LEFT, could represent interstitial edema or  atypical infection. Minimal atelectasis LEFT base. No pleural effusion or pneumothorax. Bones demineralized. IMPRESSION: Persistent bronchitic changes and interstitial infiltrates question interstitial edema versus atypical infection. Aortic Atherosclerosis (ICD10-I70.0). Electronically Signed   By: Lavonia Dana M.D.   On: 11/02/2022 12:11   DG Chest Port 1 View  Result Date: 11/01/2022 CLINICAL DATA:  Acute respiratory failure with hypoxia and hypercarbia. EXAM: PORTABLE CHEST 1 VIEW COMPARISON:  Chest radiograph 10/28/2022, 03/07/2012; CT chest 10/31/2022 FINDINGS: Lordotic technique. The heart size and mediastinal contours are within normal limits. Interval mild interstitial opacities in the left lung. Biapical pleural thickening. No lobar consolidation, pleural effusion, or pneumothorax. No acute osseous abnormality. IMPRESSION: Interval development of mild interstitial opacities in the left lung, which may reflect asymmetric pulmonary edema or atypical infection. Electronically Signed   By: Ileana Roup M.D.   On: 11/01/2022 11:53   CT HEAD WO CONTRAST (5MM)  Result Date: 10/31/2022 CLINICAL DATA:  Delirium. EXAM: CT HEAD WITHOUT CONTRAST TECHNIQUE: Contiguous axial images were obtained from the base of the skull through  the vertex without intravenous contrast. RADIATION DOSE REDUCTION: This exam was performed according to the departmental dose-optimization program which includes automated exposure control, adjustment of the mA and/or kV according to patient size and/or use of iterative reconstruction technique. COMPARISON:  None Available. FINDINGS: Brain: No acute intracranial hemorrhage. 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 No hydrocephalus or extra-axial collection. No mass or midline shift. Vascular: No hyperdense vessel or unexpected calcification. Skull: Normal. Negative for fracture or focal lesion. Sinuses/Orbits: Unremarkable. Other: None. IMPRESSION: 1. No acute intracranial hemorrhage. 2. 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 Electronically Signed   By: Emmit Alexanders M.D.   On: 10/31/2022 13:46   CT CHEST WO CONTRAST  Result Date: 10/31/2022 CLINICAL DATA:  COPD exacerbation EXAM: CT CHEST WITHOUT CONTRAST TECHNIQUE: Multidetector CT imaging of the chest was performed following the standard protocol without IV contrast. RADIATION DOSE REDUCTION: This exam was performed according to the departmental dose-optimization program which includes automated exposure control, adjustment of the mA and/or kV according to patient size and/or use of iterative reconstruction technique. COMPARISON:  Chest CT dated March 07 2012 FINDINGS: Cardiovascular: Normal heart size. Trace pericardial effusion. Normal caliber thoracic aorta with severe calcified plaque. Severe left main and three-vessel coronary artery calcifications. Mediastinum/Nodes: Esophagus and thyroid are unremarkable. No pathologically enlarged lymph nodes seen in the chest. Lungs/Pleura: Central airways are patent. Severe centrilobular emphysema. Respiratory motion artifact somewhat limits evaluation. Numerous irregular clustered solid nodular opacities, new when compared with prior.  Similar findings were also seen on 2013 prior, but less extensive. No pleural effusion or pneumothorax. Upper Abdomen: Partially visualized low-attenuation lesions of the left kidney, likely simple cysts, no specific follow-up imaging is recommended calcifications of the pancreas. Musculoskeletal: No chest wall mass or suspicious bone lesions identified. IMPRESSION: 1. Numerous irregular clustered solid nodular opacities, new when compared with prior. Findings are likely due to atypical infection. Recommend follow-up chest CT in 3 months to ensure resolution. 2. Severe left main and three-vessel coronary artery calcifications. 3. Aortic Atherosclerosis (ICD10-I70.0) and Emphysema (ICD10-J43.9). Electronically Signed   By: Yetta Glassman M.D.   On: 10/31/2022 12:30   DG Chest 2 View  Result Date: 10/28/2022 CLINICAL DATA:  Shortness of breath EXAM: CHEST - 2 VIEW COMPARISON:  03/07/2012 FINDINGS: The heart size and mediastinal contours are within normal limits. Aortic atherosclerosis. Hyperinflated lungs with coarsened interstitial markings bilaterally. No focal  airspace consolidation. No pleural effusion or pneumothorax. The visualized skeletal structures are unremarkable. IMPRESSION: Findings of COPD. No focal airspace consolidation. Electronically Signed   By: Davina Poke D.O.   On: 10/28/2022 13:11    Microbiology: Results for orders placed or performed during the hospital encounter of 10/28/22  Resp panel by RT-PCR (RSV, Flu A&B, Covid) Anterior Nasal Swab     Status: None   Collection Time: 10/28/22 12:26 PM   Specimen: Anterior Nasal Swab  Result Value Ref Range Status   SARS Coronavirus 2 by RT PCR NEGATIVE NEGATIVE Final    Comment: (NOTE) SARS-CoV-2 target nucleic acids are NOT DETECTED.  The SARS-CoV-2 RNA is generally detectable in upper respiratory specimens during the acute phase of infection. The lowest concentration of SARS-CoV-2 viral copies this assay can detect is 138  copies/mL. A negative result does not preclude SARS-Cov-2 infection and should not be used as the sole basis for treatment or other patient management decisions. A negative result may occur with  improper specimen collection/handling, submission of specimen other than nasopharyngeal swab, presence of viral mutation(s) within the areas targeted by this assay, and inadequate number of viral copies(<138 copies/mL). A negative result must be combined with clinical observations, patient history, and epidemiological information. The expected result is Negative.  Fact Sheet for Patients:  EntrepreneurPulse.com.au  Fact Sheet for Healthcare Providers:  IncredibleEmployment.be  This test is no t yet approved or cleared by the [place] FDA and  has been authorized for detection and/or diagnosis of SARS-CoV-2 by FDA under an Emergency Use Authorization (EUA). This EUA will remain  in effect (meaning this test can be used) for the duration of the COVID-19 declaration under Section 564(b)(1) of the Act, 21 U.S.C.section 360bbb-3(b)(1), unless the authorization is terminated  or revoked sooner.       Influenza A by PCR NEGATIVE NEGATIVE Final   Influenza B by PCR NEGATIVE NEGATIVE Final    Comment: (NOTE) The Xpert Xpress SARS-CoV-2/FLU/RSV plus assay is intended as an aid in the diagnosis of influenza from Nasopharyngeal swab specimens and should not be used as a sole basis for treatment. Nasal washings and aspirates are unacceptable for Xpert Xpress SARS-CoV-2/FLU/RSV testing.  Fact Sheet for Patients: EntrepreneurPulse.com.au  Fact Sheet for Healthcare Providers: IncredibleEmployment.be  This test is not yet approved or cleared by the [place] FDA and has been authorized for detection and/or diagnosis of SARS-CoV-2 by FDA under an Emergency Use Authorization (EUA). This EUA will remain in effect (meaning  this test can be used) for the duration of the COVID-19 declaration under Section 564(b)(1) of the Act, 21 U.S.C. section 360bbb-3(b)(1), unless the authorization is terminated or revoked.     Resp Syncytial Virus by PCR NEGATIVE NEGATIVE Final    Comment: (NOTE) Fact Sheet for Patients: EntrepreneurPulse.com.au  Fact Sheet for Healthcare Providers: IncredibleEmployment.be  This test is not yet approved or cleared by the [place] FDA and has been authorized for detection and/or diagnosis of SARS-CoV-2 by FDA under an Emergency Use Authorization (EUA). This EUA will remain in effect (meaning this test can be used) for the duration of the COVID-19 declaration under Section 564(b)(1) of the Act, 21 U.S.C. section 360bbb-3(b)(1), unless the authorization is terminated or revoked.  Performed at [facility], [place],  [address]   Respiratory (~20 pathogens) panel by PCR     Status: None   Collection Time: 10/31/22  1:31 PM   Specimen: Nasopharyngeal Swab; Respiratory  Result Value  Ref Range Status   Adenovirus NOT DETECTED NOT DETECTED Final   Coronavirus 229E NOT DETECTED NOT DETECTED Final    Comment: (NOTE) The Coronavirus on the Respiratory Panel, DOES NOT test for the novel  Coronavirus (2019 nCoV)    Coronavirus HKU1 NOT DETECTED NOT DETECTED Final   Coronavirus NL63 NOT DETECTED NOT DETECTED Final   Coronavirus OC43 NOT DETECTED NOT DETECTED Final   Metapneumovirus NOT DETECTED NOT DETECTED Final   Rhinovirus / Enterovirus NOT DETECTED NOT DETECTED Final   Influenza A NOT DETECTED NOT DETECTED Final   Influenza B NOT DETECTED NOT DETECTED Final   Parainfluenza Virus 1 NOT DETECTED NOT DETECTED Final   Parainfluenza Virus 2 NOT DETECTED NOT DETECTED Final   Parainfluenza Virus 3 NOT DETECTED NOT DETECTED Final   Parainfluenza Virus 4 NOT DETECTED NOT DETECTED Final   Respiratory Syncytial Virus NOT  DETECTED NOT DETECTED Final   Bordetella pertussis NOT DETECTED NOT DETECTED Final   Bordetella Parapertussis NOT DETECTED NOT DETECTED Final   Chlamydophila pneumoniae NOT DETECTED NOT DETECTED Final   Mycoplasma pneumoniae NOT DETECTED NOT DETECTED Final    Comment: Performed at [facility], [place] [address], [place], [place] [address]  MRSA Next Gen by PCR, Nasal     Status: None   Collection Time: 10/31/22  1:37 PM   Specimen: Nasal Mucosa; Nasal Swab  Result Value Ref Range Status   MRSA by PCR Next Gen NOT DETECTED NOT DETECTED Final    Comment: (NOTE) The GeneXpert MRSA Assay (FDA approved for NASAL specimens only), is one component of a comprehensive MRSA colonization surveillance program. It is not intended to diagnose MRSA infection nor to guide or monitor treatment for MRSA infections. Test performance is not FDA approved in patients less than 48 years old. Performed at [facility] [address] [place], [place] [address]     Labs: CBC: Recent Labs  Lab 11/11/22 1442  WBC 13.2*  NEUTROABS 9.9*  HGB 12.4*  HCT 39.4  MCV 96.3  PLT [id number]   Basic Metabolic Panel: Recent Labs  Lab 11/11/22 0529 11/11/22 1442  NA  --  137  K  --  4.4  CL  --  92*  CO2  --  36*  GLUCOSE  --  136*  BUN  --  31*  CREATININE 0.85 0.87  CALCIUM  --  8.6*   Liver Function Tests: No results for input(s): "AST", "ALT", "ALKPHOS", "BILITOT", "PROT", "ALBUMIN" in the last 168 hours. CBG: Recent Labs  Lab 11/11/22 1137 11/11/22 1530 11/11/22 2004 11/12/22 0817 11/12/22 1216  GLUCAP 239* 96 113* 104* 191*    Discharge time spent: greater than 30 minutes.  Signed: Fritzi Mandes, MD [facility] 11/12/2022

## 2022-11-12 NOTE — Progress Notes (Signed)
New 02 tank delivered, pt discharged with NT via wheelchair

## 2022-11-12 NOTE — TOC Transition Note (Signed)
Transition of Care [facility]) - CM/SW Discharge Note   Patient Details  Name: Gregory Haynes MRN: [id number] Date of Birth: May 26, 1941  Transition of Care [facility]) CM/SW Contact:  Laurena Slimmer, RN Phone Number: 11/12/2022, 4:19 PM   Clinical Narrative:    Discharging home per wife.  [employer] and [employer] contacted.  Kristen from [employer] confirm deliver of DME  Caryl Pina from [place] confirmed delivery of NIV.  Cheryl from [employer] notified.  Wife transporting patient home.  TOC signing off.    Final next level of care: [place] Barriers to Discharge: Barriers Resolved   Patient Goals and CMS Choice      Discharge Placement                    Name of family member notified: Spouse    Discharge Plan and Services Additional resources added to the After Visit Summary for                  DME Arranged: Walker rolling, Tub bench, Bedside commode DME Agency: [employer] Date DME Agency Contacted: 11/12/22 Time DME Agency Contacted: [phone] Representative spoke with at DME Agency: [place]: PT, OT, Nurse's Aide, Social Work [employer] Agency: [place] Date [place]: 11/12/22 Time [place]: 1619 Representative spoke with at [place]: [place] ([place]) Interventions [place]: No Food Insecurity (10/29/2022)  Housing: [place]  (10/29/2022)  Transportation Needs: Unmet Transportation Needs (10/29/2022)  Utilities: Not At Risk (10/29/2022)  Alcohol Screen: Low Risk  (07/01/2022)  Depression (PHQ2-9): Low Risk  (07/01/2022)  Financial Resource Strain: Low Risk  (06/24/2018)  Physical Activity: Unknown (07/01/2019)  Social Connections: Unknown (06/24/2018)  Stress: No Stress Concern Present (07/01/2019)  Tobacco Use: Medium Risk (10/28/2022)     Readmission Risk Interventions     No data to display

## 2022-11-12 NOTE — Progress Notes (Signed)
*  PRELIMINARY RESULTS* Echocardiogram 2D Echocardiogram has been performed.  Gregory Haynes 11/12/2022, 7:52 AM

## 2022-11-12 NOTE — Progress Notes (Signed)
Mobility Specialist - Progress Note   Pre-mobility: SpO2 (95) During mobility: SpO2 (89-90) Post-mobility:SPO2(93)     11/12/22 1056  Mobility  Activity Ambulated with assistance in hallway;Stood at bedside;Dangled on edge of bed  Level of Assistance Standby assist, set-up cues, supervision of patient - no hands on  Assistive Device Front wheel walker  Distance Ambulated (ft) 160 ft  Range of Motion/Exercises Active  Activity Response Tolerated well  Mobility Referral Yes  $Mobility charge 1 Mobility   Pt resting in recliner on 2L upon entry.Pt STS and ambulates to hallway around NS SBA. Pt expressed no sob during ambulation. Pt returned recliner and expressed feeling a little SOB and fatigue once sitting. Pt left with needs in reach and wife present at bedside.   Loma Sender Mobility Specialist 11/12/22, 11:05 AM

## 2022-11-13 ENCOUNTER — Encounter: Payer: Self-pay | Admitting: *Deleted

## 2022-11-13 NOTE — Telephone Encounter (Signed)
This encounter was created in error - please disregard.

## 2022-11-14 DIAGNOSIS — M6281 Muscle weakness (generalized): Secondary | ICD-10-CM | POA: Diagnosis not present

## 2022-11-14 DIAGNOSIS — I2510 Atherosclerotic heart disease of native coronary artery without angina pectoris: Secondary | ICD-10-CM | POA: Diagnosis not present

## 2022-11-14 DIAGNOSIS — Z9981 Dependence on supplemental oxygen: Secondary | ICD-10-CM | POA: Diagnosis not present

## 2022-11-14 DIAGNOSIS — J9621 Acute and chronic respiratory failure with hypoxia: Secondary | ICD-10-CM | POA: Diagnosis not present

## 2022-11-14 DIAGNOSIS — Z87891 Personal history of nicotine dependence: Secondary | ICD-10-CM | POA: Diagnosis not present

## 2022-11-14 DIAGNOSIS — Z9181 History of falling: Secondary | ICD-10-CM | POA: Diagnosis not present

## 2022-11-14 DIAGNOSIS — E785 Hyperlipidemia, unspecified: Secondary | ICD-10-CM | POA: Diagnosis not present

## 2022-11-14 DIAGNOSIS — R413 Other amnesia: Secondary | ICD-10-CM | POA: Diagnosis not present

## 2022-11-14 DIAGNOSIS — I10 Essential (primary) hypertension: Secondary | ICD-10-CM | POA: Diagnosis not present

## 2022-11-14 DIAGNOSIS — J449 Chronic obstructive pulmonary disease, unspecified: Secondary | ICD-10-CM | POA: Diagnosis not present

## 2022-11-18 ENCOUNTER — Ambulatory Visit: Payer: Self-pay

## 2022-11-18 NOTE — Telephone Encounter (Signed)
  Chief Complaint: Foot swelling - left great toe red and warm Symptoms:  Frequency:  Pertinent Negatives: Patient denies  Disposition: [] ED /[] Urgent Care (no appt availability in office) / [] Appointment(In office/virtual)/ []  [facility]/ [] Home Care/ [] Refused Recommended Disposition /[] [facility]/ []  Follow-up with PCP Additional Notes: Call from [place] at [place]. PT is seen by Dr. Sullivan Lone at his new clinic. Fannie Knee will call [facility] [facility] to give report. PT is no longer seen at [facility].    Answer Assessment - Initial Assessment Questions 1. ONSET: "When did the pain start?"      *No Answer* 2. LOCATION: "Where is the pain located?"      *No Answer* 3. PAIN: "How bad is the pain?"    (Scale 1-10; or mild, moderate, severe)  - MILD (1-3): doesn't interfere with normal activities.   - MODERATE (4-7): interferes with normal activities (e.g., work or school) or awakens from sleep, limping.   - SEVERE (8-10): excruciating pain, unable to do any normal activities, unable to walk.      *No Answer* 4. WORK OR EXERCISE: "Has there been any recent work or exercise that involved this part of the body?"      *No Answer* 5. CAUSE: "What do you think is causing the foot pain?"     *No Answer* 6. OTHER SYMPTOMS: "Do you have any other symptoms?" (e.g., leg pain, rash, fever, numbness)     *No Answer* 7. PREGNANCY: "Is there any chance you are pregnant?" "When was your last menstrual period?"     *No Answer*  Protocols used: Foot Pain-A-AH

## 2022-11-20 DIAGNOSIS — J9690 Respiratory failure, unspecified, unspecified whether with hypoxia or hypercapnia: Secondary | ICD-10-CM | POA: Diagnosis not present

## 2022-11-20 DIAGNOSIS — J439 Emphysema, unspecified: Secondary | ICD-10-CM | POA: Diagnosis not present

## 2022-11-20 DIAGNOSIS — J432 Centrilobular emphysema: Secondary | ICD-10-CM | POA: Diagnosis not present

## 2022-11-20 DIAGNOSIS — I25118 Atherosclerotic heart disease of native coronary artery with other forms of angina pectoris: Secondary | ICD-10-CM | POA: Diagnosis not present

## 2022-11-20 DIAGNOSIS — J449 Chronic obstructive pulmonary disease, unspecified: Secondary | ICD-10-CM | POA: Diagnosis not present

## 2022-11-20 DIAGNOSIS — J9612 Chronic respiratory failure with hypercapnia: Secondary | ICD-10-CM | POA: Diagnosis not present

## 2022-11-21 DIAGNOSIS — J9602 Acute respiratory failure with hypercapnia: Secondary | ICD-10-CM | POA: Diagnosis not present

## 2022-11-21 DIAGNOSIS — J9601 Acute respiratory failure with hypoxia: Secondary | ICD-10-CM | POA: Diagnosis not present

## 2022-11-28 ENCOUNTER — Telehealth: Payer: Self-pay | Admitting: Family Medicine

## 2022-11-28 NOTE — Telephone Encounter (Signed)
Home Health Verbal Orders - Caller/Agency: Iona Coach with [facility] Callback Number: [phone]  Requesting OT Frequency: 1 x wk for 3 wks for IADL Therapeutic exercises and activity  Iona Coach will also call over to [facility] in [place] as she has been told that is where Dr Sullivan Lone is located now and the patient has not been seen since last November and has no follow up appts scheduled. Please assist patient further

## 2022-11-28 NOTE — Telephone Encounter (Signed)
Gregory Haynes with [facility] advised to fu with patient's current PCP.

## 2022-12-03 ENCOUNTER — Encounter: Payer: Medicare HMO | Attending: Pulmonary Disease | Admitting: *Deleted

## 2022-12-03 DIAGNOSIS — Z87891 Personal history of nicotine dependence: Secondary | ICD-10-CM | POA: Insufficient documentation

## 2022-12-03 DIAGNOSIS — J449 Chronic obstructive pulmonary disease, unspecified: Secondary | ICD-10-CM | POA: Insufficient documentation

## 2022-12-03 DIAGNOSIS — Z5189 Encounter for other specified aftercare: Secondary | ICD-10-CM | POA: Insufficient documentation

## 2022-12-03 NOTE — Progress Notes (Signed)
Initial phone call completed. Diagnosis can be found in [facility] 4/11. EP Orientation scheduled for Thursday 4/25 at 2pm.

## 2022-12-05 VITALS — Ht 71.5 in | Wt 180.5 lb

## 2022-12-05 DIAGNOSIS — J432 Centrilobular emphysema: Secondary | ICD-10-CM | POA: Diagnosis not present

## 2022-12-05 DIAGNOSIS — Z5189 Encounter for other specified aftercare: Secondary | ICD-10-CM | POA: Diagnosis not present

## 2022-12-05 DIAGNOSIS — Z87891 Personal history of nicotine dependence: Secondary | ICD-10-CM | POA: Diagnosis not present

## 2022-12-05 DIAGNOSIS — J449 Chronic obstructive pulmonary disease, unspecified: Secondary | ICD-10-CM

## 2022-12-05 NOTE — Patient Instructions (Signed)
Patient Instructions  Patient Details  Name: Gregory Haynes MRN: [id number] Date of Birth: 10-10-1940 Referring Provider:  Vida Rigger, MD  Below are your personal goals for exercise, nutrition, and risk factors. Our goal is to help you stay on track towards obtaining and maintaining these goals. We will be discussing your progress on these goals with you throughout the program.  Initial Exercise Prescription:  Initial Exercise Prescription - 12/05/22 1600       Date of Initial Exercise RX and Referring Provider   Date 12/05/22      Oxygen   Oxygen Continuous    Liters 2    Maintain Oxygen Saturation 88% or higher      NuStep   Level 1    SPM 80    Minutes 15    METs 2      REL-XR   Level 1    Speed 50    Minutes 15    METs 2      Biostep-RELP   Level 1    SPM 50    Minutes 15    METs 2      Track   Laps 20    Minutes 15    METs 2.09      Prescription Details   Frequency (times per week) 3    Duration Progress to 30 minutes of continuous aerobic without signs/symptoms of physical distress      Intensity   THRR 40-80% of Max Heartrate 122- 133    Ratings of Perceived Exertion 11-13    Perceived Dyspnea 0-4      Progression   Progression Continue to progress workloads to maintain intensity without signs/symptoms of physical distress.      Resistance Training   Training Prescription Yes    Weight 3 lb    Reps 10-15             Exercise Goals: Frequency: Be able to perform aerobic exercise two to three times per week in program working toward 2-5 days per week of home exercise.  Intensity: Work with a perceived exertion of 11 (fairly light) - 15 (hard) while following your exercise prescription.  We will make changes to your prescription with you as you progress through the program.   Duration: Be able to do 30 to 45 minutes of continuous aerobic exercise in addition to a 5 minute warm-up and a 5 minute cool-down routine.   Nutrition  Goals: Your personal nutrition goals will be established when you do your nutrition analysis with the dietician.  The following are general nutrition guidelines to follow: Cholesterol < /day Sodium < /day Fiber: Men over 50 yrs - 30 grams per day  Personal Goals:  Personal Goals and Risk Factors at Admission - 12/05/22 1633       Core Components/Risk Factors/Patient Goals on Admission    Weight Management Yes;Weight Maintenance   Already lost 14 lbs since hospital visit   Intervention Weight Management: Develop a combined nutrition and exercise program designed to reach desired caloric intake, while maintaining appropriate intake of nutrient and fiber, sodium and fats, and appropriate energy expenditure required for the weight goal.;Weight Management: Provide education and appropriate resources to help participant work on and attain dietary goals.;Weight Management/Obesity: Establish reasonable short term and long term weight goals.    Admit Weight 180 lb (81.6 kg)    Goal Weight: Short Term 180 lb (81.6 kg)    Goal Weight: Long Term 180 lb (81.6 kg)  Expected Outcomes Short Term: Continue to assess and modify interventions until short term weight is achieved;Long Term: Adherence to nutrition and physical activity/exercise program aimed toward attainment of established weight goal;Weight Maintenance: Understanding of the daily nutrition guidelines, which includes 25-35% calories from fat, 7% or less cal from saturated fats, less than  cholesterol, less than 1.5gm of sodium, & 5 or more servings of fruits and vegetables daily;Understanding recommendations for meals to include 15-35% energy as protein, 25-35% energy from fat, 35-60% energy from carbohydrates, less than  of dietary cholesterol, 20-35 gm of total fiber daily;Understanding of distribution of calorie intake throughout the day with the consumption of 4-5 meals/snacks    Improve shortness of breath with ADL's Yes     Intervention Provide education, individualized exercise plan and daily activity instruction to help decrease symptoms of SOB with activities of daily living.    Expected Outcomes Short Term: Improve cardiorespiratory fitness to achieve a reduction of symptoms when performing ADLs;Long Term: Be able to perform more ADLs without symptoms or delay the onset of symptoms    Hypertension Yes    Intervention Provide education on lifestyle modifcations including regular physical activity/exercise, weight management, moderate sodium restriction and increased consumption of fresh fruit, vegetables, and low fat dairy, alcohol moderation, and smoking cessation.;Monitor prescription use compliance.    Expected Outcomes Short Term: Continued assessment and intervention until BP is < 140/41mm HG in hypertensive participants. < 130/84mm HG in hypertensive participants with diabetes, heart failure or chronic kidney disease.;Long Term: Maintenance of blood pressure at goal levels.    Lipids Yes    Intervention Provide education and support for participant on nutrition & aerobic/resistive exercise along with prescribed medications to achieve LDL 70mg , HDL >40mg .    Expected Outcomes Short Term: Participant states understanding of desired cholesterol values and is compliant with medications prescribed. Participant is following exercise prescription and nutrition guidelines.;Long Term: Cholesterol controlled with medications as prescribed, with individualized exercise RX and with personalized nutrition plan. Value goals: LDL < , HDL > 40 mg.             Tobacco Use Initial Evaluation: Social History   Tobacco Use  Smoking Status Former   Packs/day: 1.50   Years: 55.00   Additional pack years: 0.00   Total pack years: 82.50   Types: Cigarettes   Quit date: 08/13/2011   Years since quitting: 11.3  Smokeless Tobacco Never    Exercise Goals and Review:  Exercise Goals     Row Name 12/05/22 1632              Exercise Goals   Increase Physical Activity Yes       Intervention Provide advice, education, support and counseling about physical activity/exercise needs.;Develop an individualized exercise prescription for aerobic and resistive training based on initial evaluation findings, risk stratification, comorbidities and participant's personal goals.       Expected Outcomes Short Term: Attend rehab on a regular basis to increase amount of physical activity.;Long Term: Add in home exercise to make exercise part of routine and to increase amount of physical activity.;Long Term: Exercising regularly at least 3-5 days a week.       Increase Strength and Stamina Yes       Intervention Provide advice, education, support and counseling about physical activity/exercise needs.;Develop an individualized exercise prescription for aerobic and resistive training based on initial evaluation findings, risk stratification, comorbidities and participant's personal goals.       Expected Outcomes Short Term: Increase  workloads from initial exercise prescription for resistance, speed, and METs.;Short Term: Perform resistance training exercises routinely during rehab and add in resistance training at home;Long Term: Improve cardiorespiratory fitness, muscular endurance and strength as measured by increased METs and functional capacity ( )       Able to understand and use rate of perceived exertion (RPE) scale Yes       Intervention Provide education and explanation on how to use RPE scale       Expected Outcomes Short Term: Able to use RPE daily in rehab to express subjective intensity level;Long Term:  Able to use RPE to guide intensity level when exercising independently       Able to understand and use Dyspnea scale Yes       Intervention Provide education and explanation on how to use Dyspnea scale       Expected Outcomes Short Term: Able to use Dyspnea scale daily in rehab to express subjective sense of  shortness of breath during exertion;Long Term: Able to use Dyspnea scale to guide intensity level when exercising independently       Knowledge and understanding of Target Heart Rate Range (THRR) Yes       Intervention Provide education and explanation of THRR including how the numbers were predicted and where they are located for reference       Expected Outcomes Short Term: Able to state/look up THRR;Long Term: Able to use THRR to govern intensity when exercising independently;Short Term: Able to use daily as guideline for intensity in rehab       Able to check pulse independently Yes       Intervention Provide education and demonstration on how to check pulse in carotid and radial arteries.;Review the importance of being able to check your own pulse for safety during independent exercise       Expected Outcomes Long Term: Able to check pulse independently and accurately;Short Term: Able to explain why pulse checking is important during independent exercise       Understanding of Exercise Prescription Yes       Intervention Provide education, explanation, and written materials on patient's individual exercise prescription       Expected Outcomes Short Term: Able to explain program exercise prescription;Long Term: Able to explain home exercise prescription to exercise independently                Copy of goals given to participant.

## 2022-12-05 NOTE — Progress Notes (Signed)
Pulmonary Individual Treatment Plan  Patient Details  Name: Gregory Haynes MRN: [id number] Date of Birth: 07/17/1941 Referring Provider:    Initial Encounter Date:  Flowsheet Row Pulmonary Rehab from 12/05/2022 in [facility] Cardiac and Pulmonary Rehab  Date 12/05/22       Visit Diagnosis: Chronic obstructive pulmonary disease, unspecified COPD type  Patient's Home Medications on Admission:  Current Outpatient Medications:    acidophilus (RISAQUAD) CAPS capsule, Take 1 capsule by mouth daily., Disp: , Rfl:    albuterol (VENTOLIN HFA) 108 (90 Base) MCG/ACT inhaler, Inhale 1-2 puffs into the lungs every 4 (four) hours as needed for wheezing or shortness of breath., Disp: 18 g, Rfl: 3   atorvastatin (LIPITOR) 40 MG tablet, Take 1 tablet (40 mg total) by mouth every evening., Disp: 90 tablet, Rfl: 3   carvedilol (COREG) 3.125 MG tablet, Take 1 tablet (3.125 mg total) by mouth 2 (two) times daily with a meal., Disp: 60 tablet, Rfl: 1   clobetasol cream (TEMOVATE) 0.05 %, Apply 1 Application topically 2 (two) times daily., Disp: , Rfl:    feeding supplement (ENSURE ENLIVE / ENSURE PLUS) LIQD, Take 237 mLs by mouth 3 (three) times daily. (Patient not taking: Reported on 12/03/2022), Disp: 237 mL, Rfl: 12   Fluticasone-Umeclidin-Vilant (TRELEGY ELLIPTA) 100-62.5-25 MCG/INH AEPB, Inhale 1 puff into the lungs daily., Disp: , Rfl:    folic acid (FOLVITE) 1 MG tablet, Take 1 mg by mouth daily. Unsure dose (Patient not taking: Reported on 12/03/2022), Disp: , Rfl:    ipratropium-albuterol (DUONEB) 0.5-2.5 (3) MG/3ML SOLN, Inhale into the lungs., Disp: , Rfl:    lisinopril (ZESTRIL) 2.5 MG tablet, Take 1 tablet (2.5 mg total) by mouth daily. (Patient not taking: Reported on 12/03/2022), Disp: 30 tablet, Rfl: 0   OXYGEN, Inhale 2 L/hr into the lungs. At night and as needed during day., Disp: , Rfl:   Past Medical History: Past Medical History:  Diagnosis Date   Anemia    Cancer    BCC   Cataract    COPD  (chronic obstructive pulmonary disease)    Helicobacter pylori ab+    Hyperlipidemia    Hypertension    Psoriasis    Wears hearing aid in both ears     Tobacco Use: Social History   Tobacco Use  Smoking Status Former   Packs/day: 1.50   Years: 55.00   Additional pack years: 0.00   Total pack years: 82.50   Types: Cigarettes   Quit date: 08/13/2011   Years since quitting: 11.3  Smokeless Tobacco Never    Labs: Review Flowsheet  More data exists      Latest Ref Rng & Units 05/17/2020 05/01/2022 10/31/2022 11/01/2022 11/02/2022  Labs for ITP Cardiac and Pulmonary Rehab  Cholestrol 100 - 199 mg/dL 191  478  - - -  LDL (calc) 0 - 99 mg/dL 79  70  - - -  HDL-C >29 mg/dL 48  49  - - -  Trlycerides 0 - 149 mg/dL 76  84  - - -  Hemoglobin A1c 4.8 - 5.6 % - - 6.3  - -  PH, Arterial 7.35 - 7.45 - - 7.38  7.36  7.43  7.47   PCO2 arterial 32 - 48 mmHg - - 71  80  69  62   Bicarbonate 20.0 - 28.0 mmol/L - - 48.4  42.0  45.2  45.6  45.8  45.1   O2 Saturation % - - 67  98.3  95.3  94.7  98.9  99.2      Pulmonary Assessment Scores:  Pulmonary Assessment Scores     Row Name 12/05/22 1617         ADL UCSD   ADL Phase Entry     SOB Score total 27     Rest 0     Walk 0     Stairs 2     Bath 0     Dress 0     Shop 1       CAT Score   CAT Score 6       mMRC Score   mMRC Score 1              UCSD: Self-administered rating of dyspnea associated with activities of daily living (ADLs) 6-point scale (0 = "not at all" to 5 = "maximal or unable to do because of breathlessness")  Scoring Scores range from 0 to 120.  Minimally important difference is 5 units  CAT: CAT can identify the health impairment of COPD patients and is better correlated with disease progression.  CAT has a scoring range of zero to 40. The CAT score is classified into four groups of low (less than 10), medium (10 - 20), high (21-30) and very high (31-40) based on the impact level of disease on health  status. A CAT score over 10 suggests significant symptoms.  A worsening CAT score could be explained by an exacerbation, poor medication adherence, poor inhaler technique, or progression of COPD or comorbid conditions.  CAT MCID is 2 points  mMRC: mMRC (Modified Medical Research Council) Dyspnea Scale is used to assess the degree of baseline functional disability in patients of respiratory disease due to dyspnea. No minimal important difference is established. A decrease in score of 1 point or greater is considered a positive change.   Pulmonary Function Assessment:   Exercise Target Goals: Exercise Program Goal: Individual exercise prescription set using results from initial 6 min walk test and THRR while considering  patient's activity barriers and safety.   Exercise Prescription Goal: Initial exercise prescription builds to 30-45 minutes a day of aerobic activity, 2-3 days per week.  Home exercise guidelines will be given to patient during program as part of exercise prescription that the participant will acknowledge.  Education: Aerobic Exercise: - Group verbal and visual presentation on the components of exercise prescription. Introduces F.I.T.T principle from ACSM for exercise prescriptions.  Reviews F.I.T.T. principles of aerobic exercise including progression. Written material given at graduation.   Education: Resistance Exercise: - Group verbal and visual presentation on the components of exercise prescription. Introduces F.I.T.T principle from ACSM for exercise prescriptions  Reviews F.I.T.T. principles of resistance exercise including progression. Written material given at graduation.    Education: Exercise & Equipment Safety: - Individual verbal instruction and demonstration of equipment use and safety with use of the equipment. Flowsheet Row Pulmonary Rehab from 12/05/2022 in [facility] Cardiac and Pulmonary Rehab  Education need identified 12/05/22  Date 12/05/22  Educator KW   Instruction Review Code 1- Verbalizes Understanding       Education: Exercise Physiology & General Exercise Guidelines: - Group verbal and written instruction with models to review the exercise physiology of the cardiovascular system and associated critical values. Provides general exercise guidelines with specific guidelines to those with heart or lung disease.    Education: Flexibility, Balance, Mind/Body Relaxation: - Group verbal and visual presentation with interactive activity on the components of exercise prescription. Introduces F.I.T.T principle from ACSM  for exercise prescriptions. Reviews F.I.T.T. principles of flexibility and balance exercise training including progression. Also discusses the mind body connection.  Reviews various relaxation techniques to help reduce and manage stress (i.e. Deep breathing, progressive muscle relaxation, and visualization). Balance handout provided to take home. Written material given at graduation.   Activity Barriers & Risk Stratification:  Activity Barriers & Cardiac Risk Stratification - 12/05/22 1630       Activity Barriers & Cardiac Risk Stratification   Activity Barriers Shortness of Breath;Balance Concerns;Deconditioning;Muscular Weakness;Assistive Device             6 Minute Walk:  6 Minute Walk     Row Name 12/05/22 1620         6 Minute Walk   Phase Initial     Distance 890 feet     Walk Time 6 minutes     # of Rest Breaks 0     MPH 1.68     METS 2.01     RPE 11     Perceived Dyspnea  1     VO2 Peak 7.03     Symptoms Yes (comment)     Comments SOB     Resting HR 114 bpm     Resting BP 112/56     Resting Oxygen Saturation  94 %     Exercise Oxygen Saturation  during 6 min walk 87 %     Max Ex. HR 125 bpm     Max Ex. BP 124/60     2 Minute Post BP 106/60       Interval HR   1 Minute HR 122     2 Minute HR 124     3 Minute HR 125     4 Minute HR 124     5 Minute HR 123     6 Minute HR 125     2 Minute  Post HR 111     Interval Heart Rate? Yes       Interval Oxygen   Interval Oxygen? Yes     Baseline Oxygen Saturation % 94 %     1 Minute Oxygen Saturation % 92 %     1 Minute Liters of Oxygen 2 L  continuous     2 Minute Oxygen Saturation % 90 %     2 Minute Liters of Oxygen 2 L     3 Minute Oxygen Saturation % 89 %     3 Minute Liters of Oxygen 2 L     4 Minute Oxygen Saturation % 88 %     4 Minute Liters of Oxygen 2 L     5 Minute Oxygen Saturation % 87 %     5 Minute Liters of Oxygen 2 L     6 Minute Oxygen Saturation % 87 %     6 Minute Liters of Oxygen 2 L     2 Minute Post Oxygen Saturation % 96 %     2 Minute Post Liters of Oxygen 2 L             Oxygen Initial Assessment:  Oxygen Initial Assessment - 12/05/22 1617       Home Oxygen   Home Oxygen Device Portable Concentrator;Home Concentrator    Sleep Oxygen Prescription Continuous;CPAP    Liters per minute 2    Home Exercise Oxygen Prescription Continuous    Liters per minute 2    Home Resting Oxygen Prescription Continuous    Liters per minute  2    Compliance with Home Oxygen Use Yes      Initial 6 min Walk   Oxygen Used Pulsed    Liters per minute 2      Program Oxygen Prescription   Program Oxygen Prescription Continuous    Liters per minute 2      Intervention   Short Term Goals To learn and exhibit compliance with exercise, home and travel O2 prescription;To learn and understand importance of monitoring SPO2 with pulse oximeter and demonstrate accurate use of the pulse oximeter.;To learn and understand importance of maintaining oxygen saturations>88%;To learn and demonstrate proper pursed lip breathing techniques or other breathing techniques. ;To learn and demonstrate proper use of respiratory medications    Long  Term Goals Exhibits compliance with exercise, home  and travel O2 prescription;Verbalizes importance of monitoring SPO2 with pulse oximeter and return demonstration;Maintenance of O2  saturations>88%;Exhibits proper breathing techniques, such as pursed lip breathing or other method taught during program session;Compliance with respiratory medication;Demonstrates proper use of MDI's             Oxygen Re-Evaluation:   Oxygen Discharge (Final Oxygen Re-Evaluation):   Initial Exercise Prescription:  Initial Exercise Prescription - 12/05/22 1600       Date of Initial Exercise RX and Referring Provider   Date 12/05/22      Oxygen   Oxygen Continuous    Liters 2    Maintain Oxygen Saturation 88% or higher      NuStep   Level 1    SPM 80    Minutes 15    METs 2      REL-XR   Level 1    Speed 50    Minutes 15    METs 2      Biostep-RELP   Level 1    SPM 50    Minutes 15    METs 2      Track   Laps 20    Minutes 15    METs 2.09      Prescription Details   Frequency (times per week) 3    Duration Progress to 30 minutes of continuous aerobic without signs/symptoms of physical distress      Intensity   THRR 40-80% of Max Heartrate 122- 133    Ratings of Perceived Exertion 11-13    Perceived Dyspnea 0-4      Progression   Progression Continue to progress workloads to maintain intensity without signs/symptoms of physical distress.      Resistance Training   Training Prescription Yes    Weight 3 lb    Reps 10-15             Perform Capillary Blood Glucose checks as needed.  Exercise Prescription Changes:   Exercise Prescription Changes     Row Name 12/05/22 1600             Response to Exercise   Blood Pressure (Admit) 112/56       Blood Pressure (Exercise) 124/60       Blood Pressure (Exit) 106/60       Heart Rate (Admit) 114 bpm       Heart Rate (Exercise) 125 bpm       Heart Rate (Exit) 111 bpm       Oxygen Saturation (Admit) 94 %       Oxygen Saturation (Exercise) 87 %       Oxygen Saturation (Exit) 96 %       Rating of Perceived  Exertion (Exercise) 11       Perceived Dyspnea (Exercise) 1       Symptoms SOB        Comments walk test results                Exercise Comments:   Exercise Goals and Review:   Exercise Goals     Row Name 12/05/22 1632             Exercise Goals   Increase Physical Activity Yes       Intervention Provide advice, education, support and counseling about physical activity/exercise needs.;Develop an individualized exercise prescription for aerobic and resistive training based on initial evaluation findings, risk stratification, comorbidities and participant's personal goals.       Expected Outcomes Short Term: Attend rehab on a regular basis to increase amount of physical activity.;Long Term: Add in home exercise to make exercise part of routine and to increase amount of physical activity.;Long Term: Exercising regularly at least 3-5 days a week.       Increase Strength and Stamina Yes       Intervention Provide advice, education, support and counseling about physical activity/exercise needs.;Develop an individualized exercise prescription for aerobic and resistive training based on initial evaluation findings, risk stratification, comorbidities and participant's personal goals.       Expected Outcomes Short Term: Increase workloads from initial exercise prescription for resistance, speed, and METs.;Short Term: Perform resistance training exercises routinely during rehab and add in resistance training at home;Long Term: Improve cardiorespiratory fitness, muscular endurance and strength as measured by increased METs and functional capacity ( )       Able to understand and use rate of perceived exertion (RPE) scale Yes       Intervention Provide education and explanation on how to use RPE scale       Expected Outcomes Short Term: Able to use RPE daily in rehab to express subjective intensity level;Long Term:  Able to use RPE to guide intensity level when exercising independently       Able to understand and use Dyspnea scale Yes       Intervention Provide education  and explanation on how to use Dyspnea scale       Expected Outcomes Short Term: Able to use Dyspnea scale daily in rehab to express subjective sense of shortness of breath during exertion;Long Term: Able to use Dyspnea scale to guide intensity level when exercising independently       Knowledge and understanding of Target Heart Rate Range (THRR) Yes       Intervention Provide education and explanation of THRR including how the numbers were predicted and where they are located for reference       Expected Outcomes Short Term: Able to state/look up THRR;Long Term: Able to use THRR to govern intensity when exercising independently;Short Term: Able to use daily as guideline for intensity in rehab       Able to check pulse independently Yes       Intervention Provide education and demonstration on how to check pulse in carotid and radial arteries.;Review the importance of being able to check your own pulse for safety during independent exercise       Expected Outcomes Long Term: Able to check pulse independently and accurately;Short Term: Able to explain why pulse checking is important during independent exercise       Understanding of Exercise Prescription Yes       Intervention Provide education, explanation, and written materials on  patient's individual exercise prescription       Expected Outcomes Short Term: Able to explain program exercise prescription;Long Term: Able to explain home exercise prescription to exercise independently                Exercise Goals Re-Evaluation :   Discharge Exercise Prescription (Final Exercise Prescription Changes):  Exercise Prescription Changes - 12/05/22 1600       Response to Exercise   Blood Pressure (Admit) 112/56    Blood Pressure (Exercise) 124/60    Blood Pressure (Exit) 106/60    Heart Rate (Admit) 114 bpm    Heart Rate (Exercise) 125 bpm    Heart Rate (Exit) 111 bpm    Oxygen Saturation (Admit) 94 %    Oxygen Saturation (Exercise) 87 %     Oxygen Saturation (Exit) 96 %    Rating of Perceived Exertion (Exercise) 11    Perceived Dyspnea (Exercise) 1    Symptoms SOB    Comments walk test results             Nutrition:  Target Goals: Understanding of nutrition guidelines, daily intake of sodium 1500mg , cholesterol 200mg , calories 30% from fat and 7% or less from saturated fats, daily to have 5 or more servings of fruits and vegetables.  Education: All About Nutrition: -Group instruction provided by verbal, written material, interactive activities, discussions, models, and posters to present general guidelines for heart healthy nutrition including fat, fiber, MyPlate, the role of sodium in heart healthy nutrition, utilization of the nutrition label, and utilization of this knowledge for meal planning. Follow up email sent as well. Written material given at graduation. Flowsheet Row Pulmonary Rehab from 12/05/2022 in [facility] Cardiac and Pulmonary Rehab  Education need identified 12/05/22       Biometrics:  Pre Biometrics - 12/05/22 1630       Pre Biometrics   Height 5' 11.5" (1.816 m)    Weight 180 lb 8 oz (81.9 kg)    Waist Circumference --   declined   Hip Circumference --   declined   BMI (Calculated) 24.83    Single Leg Stand 5.5 seconds              Nutrition Therapy Plan and Nutrition Goals:  Nutrition Therapy & Goals - 12/05/22 1619       Intervention Plan   Intervention Prescribe, educate and counsel regarding individualized specific dietary modifications aiming towards targeted core components such as weight, hypertension, lipid management, diabetes, heart failure and other comorbidities.    Expected Outcomes Short Term Goal: Understand basic principles of dietary content, such as calories, fat, sodium, cholesterol and nutrients.;Short Term Goal: A plan has been developed with personal nutrition goals set during dietitian appointment.;Long Term Goal: Adherence to prescribed nutrition plan.              Nutrition Assessments:  MEDIFICTS Score Key: ?70 Need to make dietary changes  40-70 Heart Healthy Diet ? 40 Therapeutic Level Cholesterol Diet  Flowsheet Row Pulmonary Rehab from 12/05/2022 in [facility] Cardiac and Pulmonary Rehab  Picture Your Plate Total Score on Admission 81      Picture Your Plate Scores: <78 Unhealthy dietary pattern with much room for improvement. 41-50 Dietary pattern unlikely to meet recommendations for good health and room for improvement. 51-60 More healthful dietary pattern, with some room for improvement.  >60 Healthy dietary pattern, although there may be some specific behaviors that could be improved.   Nutrition Goals Re-Evaluation:   Nutrition Goals  Discharge (Final Nutrition Goals Re-Evaluation):   Psychosocial: Target Goals: Acknowledge presence or absence of significant depression and/or stress, maximize coping skills, provide positive support system. Participant is able to verbalize types and ability to use techniques and skills needed for reducing stress and depression.   Education: Stress, Anxiety, and Depression - Group verbal and visual presentation to define topics covered.  Reviews how body is impacted by stress, anxiety, and depression.  Also discusses healthy ways to reduce stress and to treat/manage anxiety and depression.  Written material given at graduation.   Education: Sleep Hygiene -Provides group verbal and written instruction about how sleep can affect your health.  Define sleep hygiene, discuss sleep cycles and impact of sleep habits. Review good sleep hygiene tips.    Initial Review & Psychosocial Screening:  Initial Psych Review & Screening - 12/03/22 1033       Initial Review   Current issues with None Identified      Family Dynamics   Good Support System? Yes   wife, family     Barriers   Psychosocial barriers to participate in program There are no identifiable barriers or psychosocial needs.      Screening  Interventions   Interventions Provide feedback about the scores to participant;Encouraged to exercise;To provide support and resources with identified psychosocial needs    Expected Outcomes Short Term goal: Utilizing psychosocial counselor, staff and physician to assist with identification of specific Stressors or current issues interfering with healing process. Setting desired goal for each stressor or current issue identified.;Long Term Goal: Stressors or current issues are controlled or eliminated.;Short Term goal: Identification and review with participant of any Quality of Life or Depression concerns found by scoring the questionnaire.;Long Term goal: The participant improves quality of Life and PHQ9 Scores as seen by post scores and/or verbalization of changes             Quality of Life Scores:  Scores of 19 and below usually indicate a poorer quality of life in these areas.  A difference of  2-3 points is a clinically meaningful difference.  A difference of 2-3 points in the total score of the Quality of Life Index has been associated with significant improvement in overall quality of life, self-image, physical symptoms, and general health in studies assessing change in quality of life.  PHQ-9: Review Flowsheet  More data exists      12/05/2022 07/01/2022 05/01/2022 07/01/2019 06/24/2018  Depression screen PHQ 2/9  Decreased Interest 0 0 0 0 0  Down, Depressed, Hopeless 0 0 0 0 1  PHQ - 2 Score 0 0 0 0 1  Altered sleeping 0 0 0 - -  Tired, decreased energy 1 0 0 - -  Change in appetite 0 0 0 - -  Feeling bad or failure about yourself  0 0 0 - -  Trouble concentrating 0 0 0 - -  Moving slowly or fidgety/restless 0 0 0 - -  Suicidal thoughts 0 0 0 - -  PHQ-9 Score 1 0 0 - -  Difficult doing work/chores Not difficult at all Not difficult at all Not difficult at all - -   Interpretation of Total Score  Total Score Depression Severity:  1-4 = Minimal depression, 5-9 = Mild  depression, 10-14 = Moderate depression, 15-19 = Moderately severe depression, 20-27 = Severe depression   Psychosocial Evaluation and Intervention:  Psychosocial Evaluation - 12/03/22 1033       Psychosocial Evaluation & Interventions   Comments Gregory Haynes is coming to pulmonary rehab for COPD. He has been on oxygen for 12 years and feels comfortable managing it. After his most recent hospitalization, PT came to his house to show him some things. However, he and his wife admit that it wasn't too much to go off of and mainly he doesn't feel motivated to do the exercises alone. So they are both ready for him to get started in the program. He likes people and being in a group setting. He and his wife manage his health care needs and he states he feels like he is in a good spot mentally. He and his wife joked that the biggest stressor in his life right now is that she won't let him have the dessert he wants every night. He is motivated to attend the program    Expected Outcomes Short: attend pulmonary rehab for education and exercise. Long: develop and maintain positive self care habits.    Continue Psychosocial Services  Follow up required by staff             Psychosocial Re-Evaluation:   Psychosocial Discharge (Final Psychosocial Re-Evaluation):   Education: Education Goals: Education classes will be provided on a weekly basis, covering required topics. Participant will state understanding/return demonstration of topics presented.  Learning Barriers/Preferences:  Learning Barriers/Preferences - 12/03/22 1014       Learning Barriers/Preferences   Learning Barriers Hearing    Learning Preferences Individual Instruction             General Pulmonary Education Topics:  Infection Prevention: - Provides verbal and written material to individual with discussion of infection control including proper hand washing and proper equipment cleaning during exercise session. Flowsheet Row  Pulmonary Rehab from 12/05/2022 in [facility] Cardiac and Pulmonary Rehab  Education need identified 12/05/22  Date 12/05/22  Educator KW  Instruction Review Code 1- Verbalizes Understanding       Falls Prevention: - Provides verbal and written material to individual with discussion of falls prevention and safety. Flowsheet Row Pulmonary Rehab from 12/05/2022 in [facility] Cardiac and Pulmonary Rehab  Education need identified 12/05/22  Date 12/05/22  Educator KW  Instruction Review Code 1- Verbalizes Understanding       Chronic Lung Disease Review: - Group verbal instruction with posters, models, PowerPoint presentations and videos,  to review new updates, new respiratory medications, new advancements in procedures and treatments. Providing information on websites and "800" numbers for continued self-education. Includes information about supplement oxygen, available portable oxygen systems, continuous and intermittent flow rates, oxygen safety, concentrators, and Medicare reimbursement for oxygen. Explanation of Pulmonary Drugs, including class, frequency, complications, importance of spacers, rinsing mouth after steroid MDI's, and proper cleaning methods for nebulizers. Review of basic lung anatomy and physiology related to function, structure, and complications of lung disease. Review of risk factors. Discussion about methods for diagnosing sleep apnea and types of masks and machines for OSA. Includes a review of the use of types of environmental controls: home humidity, furnaces, filters, dust mite/pet prevention, HEPA vacuums. Discussion about weather changes, air quality and the benefits of nasal washing. Instruction on Warning signs, infection symptoms, calling MD promptly, preventive modes, and value of vaccinations. Review of effective airway clearance, coughing and/or vibration techniques. Emphasizing that all should Create an Action Plan. Written material given at graduation. Flowsheet Row  Pulmonary Rehab from 12/05/2022 in [facility]  Education need identified 12/05/22       AED/CPR: - Group verbal and written instruction with  the use of models to demonstrate the basic use of the AED with the basic ABC's of resuscitation.    Anatomy and Cardiac Procedures: - Group verbal and visual presentation and models provide information about basic cardiac anatomy and function. Reviews the testing methods done to diagnose heart disease and the outcomes of the test results. Describes the treatment choices: Medical Management, Angioplasty, or Coronary Bypass Surgery for treating various heart conditions including Myocardial Infarction, Angina, Valve Disease, and Cardiac Arrhythmias.  Written material given at graduation.   Medication Safety: - Group verbal and visual instruction to review commonly prescribed medications for heart and lung disease. Reviews the medication, class of the drug, and side effects. Includes the steps to properly store meds and maintain the prescription regimen.  Written material given at graduation.   Other: -Provides group and verbal instruction on various topics (see comments)   Knowledge Questionnaire Score:  Knowledge Questionnaire Score - 12/05/22 1617       Knowledge Questionnaire Score   Pre Score 16/18              Core Components/Risk Factors/Patient Goals at Admission:  Personal Goals and Risk Factors at Admission - 12/05/22 1633       Core Components/Risk Factors/Patient Goals on Admission    Weight Management Yes;Weight Maintenance   Already lost 14 lbs since hospital visit   Intervention Weight Management: Develop a combined nutrition and exercise program designed to reach desired caloric intake, while maintaining appropriate intake of nutrient and fiber, sodium and fats, and appropriate energy expenditure required for the weight goal.;Weight Management: Provide education and appropriate resources to help  participant work on and attain dietary goals.;Weight Management/Obesity: Establish reasonable short term and long term weight goals.    Admit Weight 180 lb (81.6 kg)    Goal Weight: Short Term 180 lb (81.6 kg)    Goal Weight: Long Term 180 lb (81.6 kg)    Expected Outcomes Short Term: Continue to assess and modify interventions until short term weight is achieved;Long Term: Adherence to nutrition and physical activity/exercise program aimed toward attainment of established weight goal;Weight Maintenance: Understanding of the daily nutrition guidelines, which includes 25-35% calories from fat, 7% or less cal from saturated fats, less than  cholesterol, less than 1.5gm of sodium, & 5 or more servings of fruits and vegetables daily;Understanding recommendations for meals to include 15-35% energy as protein, 25-35% energy from fat, 35-60% energy from carbohydrates, less than  of dietary cholesterol, 20-35 gm of total fiber daily;Understanding of distribution of calorie intake throughout the day with the consumption of 4-5 meals/snacks    Improve shortness of breath with ADL's Yes    Intervention Provide education, individualized exercise plan and daily activity instruction to help decrease symptoms of SOB with activities of daily living.    Expected Outcomes Short Term: Improve cardiorespiratory fitness to achieve a reduction of symptoms when performing ADLs;Long Term: Be able to perform more ADLs without symptoms or delay the onset of symptoms    Hypertension Yes    Intervention Provide education on lifestyle modifcations including regular physical activity/exercise, weight management, moderate sodium restriction and increased consumption of fresh fruit, vegetables, and low fat dairy, alcohol moderation, and smoking cessation.;Monitor prescription use compliance.    Expected Outcomes Short Term: Continued assessment and intervention until BP is < 140/43mm HG in hypertensive participants. < 130/53mm  HG in hypertensive participants with diabetes, heart failure or chronic kidney disease.;Long Term: Maintenance of blood pressure at goal levels.  Lipids Yes    Intervention Provide education and support for participant on nutrition & aerobic/resistive exercise along with prescribed medications to achieve LDL 70mg , HDL >40mg .    Expected Outcomes Short Term: Participant states understanding of desired cholesterol values and is compliant with medications prescribed. Participant is following exercise prescription and nutrition guidelines.;Long Term: Cholesterol controlled with medications as prescribed, with individualized exercise RX and with personalized nutrition plan. Value goals: LDL < 70mg , HDL > 40 mg.             Education:Diabetes - Individual verbal and written instruction to review signs/symptoms of diabetes, desired ranges of glucose level fasting, after meals and with exercise. Acknowledge that pre and post exercise glucose checks will be done for 3 sessions at entry of program.   Know Your Numbers and Heart Failure: - Group verbal and visual instruction to discuss disease risk factors for cardiac and pulmonary disease and treatment options.  Reviews associated critical values for Overweight/Obesity, Hypertension, Cholesterol, and Diabetes.  Discusses basics of heart failure: signs/symptoms and treatments.  Introduces Heart Failure Zone chart for action plan for heart failure.  Written material given at graduation.   Core Components/Risk Factors/Patient Goals Review:    Core Components/Risk Factors/Patient Goals at Discharge (Final Review):    ITP Comments:  ITP Comments     Row Name 12/03/22 1037 12/05/22 1612         ITP Comments Initial phone call completed. Diagnosis can be found in [facility] 4/11. EP Orientation scheduled for Thursday 4/25 at 2pm. Completed and gym orientation. Initial ITP created and sent for review to Dr. Vida Rigger, Medical Director.                Comments: Initial ITP

## 2022-12-06 DIAGNOSIS — R000 Tachycardia, unspecified: Secondary | ICD-10-CM | POA: Diagnosis not present

## 2022-12-09 ENCOUNTER — Telehealth: Payer: Self-pay

## 2022-12-09 DIAGNOSIS — J449 Chronic obstructive pulmonary disease, unspecified: Secondary | ICD-10-CM

## 2022-12-09 NOTE — Telephone Encounter (Signed)
Per Dr. Karna Christmas, pause pulmonary rehab appointments- would like to order a holter monitor and be evaluated and cleared to go from a cardiac stand point until he returns. Left message for patient and wife to call back to discuss appts.

## 2022-12-10 NOTE — Telephone Encounter (Signed)
Wife returned call- Let her know we took out his pulmonary rehab appointments until 5/13 as a rough estimated time back, though patient needs clearance to return after his holter monitor results and follow up with MD. She verbalized understanding. Delay start date until cleared.

## 2022-12-11 ENCOUNTER — Ambulatory Visit: Payer: Medicare HMO

## 2022-12-12 DIAGNOSIS — J9621 Acute and chronic respiratory failure with hypoxia: Secondary | ICD-10-CM | POA: Diagnosis not present

## 2022-12-12 DIAGNOSIS — J449 Chronic obstructive pulmonary disease, unspecified: Secondary | ICD-10-CM | POA: Diagnosis not present

## 2022-12-13 ENCOUNTER — Ambulatory Visit: Payer: Medicare HMO

## 2022-12-16 ENCOUNTER — Ambulatory Visit: Payer: Medicare HMO

## 2022-12-16 DIAGNOSIS — R000 Tachycardia, unspecified: Secondary | ICD-10-CM | POA: Diagnosis not present

## 2022-12-18 ENCOUNTER — Ambulatory Visit: Payer: Medicare HMO

## 2022-12-19 DIAGNOSIS — J449 Chronic obstructive pulmonary disease, unspecified: Secondary | ICD-10-CM

## 2022-12-20 ENCOUNTER — Ambulatory Visit: Payer: Medicare HMO

## 2022-12-20 DIAGNOSIS — J449 Chronic obstructive pulmonary disease, unspecified: Secondary | ICD-10-CM | POA: Diagnosis not present

## 2022-12-23 ENCOUNTER — Ambulatory Visit: Payer: Medicare HMO

## 2022-12-24 DIAGNOSIS — D2261 Melanocytic nevi of right upper limb, including shoulder: Secondary | ICD-10-CM | POA: Diagnosis not present

## 2022-12-24 DIAGNOSIS — C44712 Basal cell carcinoma of skin of right lower limb, including hip: Secondary | ICD-10-CM | POA: Diagnosis not present

## 2022-12-24 DIAGNOSIS — D225 Melanocytic nevi of trunk: Secondary | ICD-10-CM | POA: Diagnosis not present

## 2022-12-24 DIAGNOSIS — D485 Neoplasm of uncertain behavior of skin: Secondary | ICD-10-CM | POA: Diagnosis not present

## 2022-12-24 DIAGNOSIS — L820 Inflamed seborrheic keratosis: Secondary | ICD-10-CM | POA: Diagnosis not present

## 2022-12-24 DIAGNOSIS — D2262 Melanocytic nevi of left upper limb, including shoulder: Secondary | ICD-10-CM | POA: Diagnosis not present

## 2022-12-24 DIAGNOSIS — D2271 Melanocytic nevi of right lower limb, including hip: Secondary | ICD-10-CM | POA: Diagnosis not present

## 2022-12-24 DIAGNOSIS — C4359 Malignant melanoma of other part of trunk: Secondary | ICD-10-CM | POA: Diagnosis not present

## 2022-12-24 DIAGNOSIS — L538 Other specified erythematous conditions: Secondary | ICD-10-CM | POA: Diagnosis not present

## 2022-12-24 DIAGNOSIS — X32XXXA Exposure to sunlight, initial encounter: Secondary | ICD-10-CM | POA: Diagnosis not present

## 2022-12-24 DIAGNOSIS — L570 Actinic keratosis: Secondary | ICD-10-CM | POA: Diagnosis not present

## 2022-12-24 DIAGNOSIS — Z85828 Personal history of other malignant neoplasm of skin: Secondary | ICD-10-CM | POA: Diagnosis not present

## 2022-12-24 DIAGNOSIS — L400 Psoriasis vulgaris: Secondary | ICD-10-CM | POA: Diagnosis not present

## 2022-12-25 ENCOUNTER — Ambulatory Visit: Payer: Medicare HMO

## 2022-12-25 ENCOUNTER — Encounter: Payer: Self-pay | Admitting: *Deleted

## 2022-12-25 DIAGNOSIS — J449 Chronic obstructive pulmonary disease, unspecified: Secondary | ICD-10-CM

## 2022-12-25 NOTE — Progress Notes (Signed)
Pulmonary Individual Treatment Plan  Patient Details  Name: Gregory Haynes MRN: [id number] Date of Birth: 1940/10/15 Referring Provider:    Initial Encounter Date:  Flowsheet Row Pulmonary Rehab from 12/05/2022 in [facility] Cardiac and Pulmonary Rehab  Date 12/05/22       Visit Diagnosis: Chronic obstructive pulmonary disease, unspecified COPD type (HCC)  Patient's Home Medications on Admission:  Current Outpatient Medications:    acidophilus (RISAQUAD) CAPS capsule, Take 1 capsule by mouth daily., Disp: , Rfl:    albuterol (VENTOLIN HFA) 108 (90 Base) MCG/ACT inhaler, Inhale 1-2 puffs into the lungs every 4 (four) hours as needed for wheezing or shortness of breath., Disp: 18 g, Rfl: 3   atorvastatin (LIPITOR) 40 MG tablet, Take 1 tablet (40 mg total) by mouth every evening., Disp: 90 tablet, Rfl: 3   carvedilol (COREG) 3.125 MG tablet, Take 1 tablet (3.125 mg total) by mouth 2 (two) times daily with a meal., Disp: 60 tablet, Rfl: 1   clobetasol cream (TEMOVATE) 0.05 %, Apply 1 Application topically 2 (two) times daily., Disp: , Rfl:    feeding supplement (ENSURE ENLIVE / ENSURE PLUS) LIQD, Take 237 mLs by mouth 3 (three) times daily. (Patient not taking: Reported on 12/03/2022), Disp: 237 mL, Rfl: 12   Fluticasone-Umeclidin-Vilant (TRELEGY ELLIPTA) 100-62.5-25 MCG/INH AEPB, Inhale 1 puff into the lungs daily., Disp: , Rfl:    folic acid (FOLVITE) 1 MG tablet, Take 1 mg by mouth daily. Unsure dose (Patient not taking: Reported on 12/03/2022), Disp: , Rfl:    ipratropium-albuterol (DUONEB) 0.5-2.5 (3) MG/3ML SOLN, Inhale into the lungs., Disp: , Rfl:    lisinopril (ZESTRIL) 2.5 MG tablet, Take 1 tablet (2.5 mg total) by mouth daily. (Patient not taking: Reported on 12/03/2022), Disp: 30 tablet, Rfl: 0   OXYGEN, Inhale 2 L/hr into the lungs. At night and as needed during day., Disp: , Rfl:   Past Medical History: Past Medical History:  Diagnosis Date   Anemia    Cancer (HCC)    BCC    Cataract    COPD (chronic obstructive pulmonary disease) (HCC)    Helicobacter pylori ab+    Hyperlipidemia    Hypertension    Psoriasis    Wears hearing aid in both ears     Tobacco Use: Social History   Tobacco Use  Smoking Status Former   Packs/day: 1.50   Years: 55.00   Additional pack years: 0.00   Total pack years: 82.50   Types: Cigarettes   Quit date: 08/13/2011   Years since quitting: 11.3  Smokeless Tobacco Never    Labs: Review Flowsheet  More data exists      Latest Ref Rng & Units 05/17/2020 05/01/2022 10/31/2022 11/01/2022 11/02/2022  Labs for ITP Cardiac and Pulmonary Rehab  Cholestrol 100 - 199 mg/dL 696  295  - - -  LDL (calc) 0 - 99 mg/dL 79  70  - - -  HDL-C >28 mg/dL 48  49  - - -  Trlycerides 0 - 149 mg/dL 76  84  - - -  Hemoglobin A1c 4.8 - 5.6 % - - 6.3  - -  PH, Arterial 7.35 - 7.45 - - 7.38  7.36  7.43  7.47   PCO2 arterial 32 - 48 mmHg - - 71  80  69  62   Bicarbonate 20.0 - 28.0 mmol/L - - 48.4  42.0  45.2  45.6  45.8  45.1   O2 Saturation % - - 67  98.3  95.3  94.7  98.9  99.2      Pulmonary Assessment Scores:  Pulmonary Assessment Scores     Row Name 12/05/22 1617         ADL UCSD   ADL Phase Entry     SOB Score total 27     Rest 0     Walk 0     Stairs 2     Bath 0     Dress 0     Shop 1       CAT Score   CAT Score 6       mMRC Score   mMRC Score 1              UCSD: Self-administered rating of dyspnea associated with activities of daily living (ADLs) 6-point scale (0 = "not at all" to 5 = "maximal or unable to do because of breathlessness")  Scoring Scores range from 0 to 120.  Minimally important difference is 5 units  CAT: CAT can identify the health impairment of COPD patients and is better correlated with disease progression.  CAT has a scoring range of zero to 40. The CAT score is classified into four groups of low (less than 10), medium (10 - 20), high (21-30) and very high (31-40) based on the impact level of  disease on health status. A CAT score over 10 suggests significant symptoms.  A worsening CAT score could be explained by an exacerbation, poor medication adherence, poor inhaler technique, or progression of COPD or comorbid conditions.  CAT MCID is 2 points  mMRC: mMRC (Modified Medical Research Council) Dyspnea Scale is used to assess the degree of baseline functional disability in patients of respiratory disease due to dyspnea. No minimal important difference is established. A decrease in score of 1 point or greater is considered a positive change.   Pulmonary Function Assessment:   Exercise Target Goals: Exercise Program Goal: Individual exercise prescription set using results from initial 6 min walk test and THRR while considering  patient's activity barriers and safety.   Exercise Prescription Goal: Initial exercise prescription builds to 30-45 minutes a day of aerobic activity, 2-3 days per week.  Home exercise guidelines will be given to patient during program as part of exercise prescription that the participant will acknowledge.  Education: Aerobic Exercise: - Group verbal and visual presentation on the components of exercise prescription. Introduces F.I.T.T principle from ACSM for exercise prescriptions.  Reviews F.I.T.T. principles of aerobic exercise including progression. Written material given at graduation.   Education: Resistance Exercise: - Group verbal and visual presentation on the components of exercise prescription. Introduces F.I.T.T principle from ACSM for exercise prescriptions  Reviews F.I.T.T. principles of resistance exercise including progression. Written material given at graduation.    Education: Exercise & Equipment Safety: - Individual verbal instruction and demonstration of equipment use and safety with use of the equipment. Flowsheet Row Pulmonary Rehab from 12/05/2022 in [facility] Cardiac and Pulmonary Rehab  Education need identified 12/05/22  Date 12/05/22   Educator KW  Instruction Review Code 1- Verbalizes Understanding       Education: Exercise Physiology & General Exercise Guidelines: - Group verbal and written instruction with models to review the exercise physiology of the cardiovascular system and associated critical values. Provides general exercise guidelines with specific guidelines to those with heart or lung disease.    Education: Flexibility, Balance, Mind/Body Relaxation: - Group verbal and visual presentation with interactive activity on the components of exercise prescription. Introduces F.I.T.T principle  from ACSM for exercise prescriptions. Reviews F.I.T.T. principles of flexibility and balance exercise training including progression. Also discusses the mind body connection.  Reviews various relaxation techniques to help reduce and manage stress (i.e. Deep breathing, progressive muscle relaxation, and visualization). Balance handout provided to take home. Written material given at graduation.   Activity Barriers & Risk Stratification:  Activity Barriers & Cardiac Risk Stratification - 12/05/22 1630       Activity Barriers & Cardiac Risk Stratification   Activity Barriers Shortness of Breath;Balance Concerns;Deconditioning;Muscular Weakness;Assistive Device             6 Minute Walk:  6 Minute Walk     Row Name 12/05/22 1620         6 Minute Walk   Phase Initial     Distance 890 feet     Walk Time 6 minutes     # of Rest Breaks 0     MPH 1.68     METS 2.01     RPE 11     Perceived Dyspnea  1     VO2 Peak 7.03     Symptoms Yes (comment)     Comments SOB     Resting HR 114 bpm     Resting BP 112/56     Resting Oxygen Saturation  94 %     Exercise Oxygen Saturation  during 6 min walk 87 %     Max Ex. HR 125 bpm     Max Ex. BP 124/60     2 Minute Post BP 106/60       Interval HR   1 Minute HR 122     2 Minute HR 124     3 Minute HR 125     4 Minute HR 124     5 Minute HR 123     6 Minute HR 125      2 Minute Post HR 111     Interval Heart Rate? Yes       Interval Oxygen   Interval Oxygen? Yes     Baseline Oxygen Saturation % 94 %     1 Minute Oxygen Saturation % 92 %     1 Minute Liters of Oxygen 2 L  continuous     2 Minute Oxygen Saturation % 90 %     2 Minute Liters of Oxygen 2 L     3 Minute Oxygen Saturation % 89 %     3 Minute Liters of Oxygen 2 L     4 Minute Oxygen Saturation % 88 %     4 Minute Liters of Oxygen 2 L     5 Minute Oxygen Saturation % 87 %     5 Minute Liters of Oxygen 2 L     6 Minute Oxygen Saturation % 87 %     6 Minute Liters of Oxygen 2 L     2 Minute Post Oxygen Saturation % 96 %     2 Minute Post Liters of Oxygen 2 L             Oxygen Initial Assessment:  Oxygen Initial Assessment - 12/05/22 1617       Home Oxygen   Home Oxygen Device Portable Concentrator;Home Concentrator    Sleep Oxygen Prescription Continuous;CPAP    Liters per minute 2    Home Exercise Oxygen Prescription Continuous    Liters per minute 2    Home Resting Oxygen Prescription Continuous    Liters  per minute 2    Compliance with Home Oxygen Use Yes      Initial 6 min Walk   Oxygen Used Pulsed    Liters per minute 2      Program Oxygen Prescription   Program Oxygen Prescription Continuous    Liters per minute 2      Intervention   Short Term Goals To learn and exhibit compliance with exercise, home and travel O2 prescription;To learn and understand importance of monitoring SPO2 with pulse oximeter and demonstrate accurate use of the pulse oximeter.;To learn and understand importance of maintaining oxygen saturations>88%;To learn and demonstrate proper pursed lip breathing techniques or other breathing techniques. ;To learn and demonstrate proper use of respiratory medications    Long  Term Goals Exhibits compliance with exercise, home  and travel O2 prescription;Verbalizes importance of monitoring SPO2 with pulse oximeter and return demonstration;Maintenance of  O2 saturations>88%;Exhibits proper breathing techniques, such as pursed lip breathing or other method taught during program session;Compliance with respiratory medication;Demonstrates proper use of MDI's             Oxygen Re-Evaluation:   Oxygen Discharge (Final Oxygen Re-Evaluation):   Initial Exercise Prescription:  Initial Exercise Prescription - 12/05/22 1600       Date of Initial Exercise RX and Referring Provider   Date 12/05/22      Oxygen   Oxygen Continuous    Liters 2    Maintain Oxygen Saturation 88% or higher      NuStep   Level 1    SPM 80    Minutes 15    METs 2      REL-XR   Level 1    Speed 50    Minutes 15    METs 2      Biostep-RELP   Level 1    SPM 50    Minutes 15    METs 2      Track   Laps 20    Minutes 15    METs 2.09      Prescription Details   Frequency (times per week) 3    Duration Progress to 30 minutes of continuous aerobic without signs/symptoms of physical distress      Intensity   THRR 40-80% of Max Heartrate 122- 133    Ratings of Perceived Exertion 11-13    Perceived Dyspnea 0-4      Progression   Progression Continue to progress workloads to maintain intensity without signs/symptoms of physical distress.      Resistance Training   Training Prescription Yes    Weight 3 lb    Reps 10-15             Perform Capillary Blood Glucose checks as needed.  Exercise Prescription Changes:   Exercise Prescription Changes     Row Name 12/05/22 1600             Response to Exercise   Blood Pressure (Admit) 112/56       Blood Pressure (Exercise) 124/60       Blood Pressure (Exit) 106/60       Heart Rate (Admit) 114 bpm       Heart Rate (Exercise) 125 bpm       Heart Rate (Exit) 111 bpm       Oxygen Saturation (Admit) 94 %       Oxygen Saturation (Exercise) 87 %       Oxygen Saturation (Exit) 96 %       Rating  of Perceived Exertion (Exercise) 11       Perceived Dyspnea (Exercise) 1       Symptoms SOB        Comments walk test results                Exercise Comments:   Exercise Goals and Review:   Exercise Goals     Row Name 12/05/22 1632             Exercise Goals   Increase Physical Activity Yes       Intervention Provide advice, education, support and counseling about physical activity/exercise needs.;Develop an individualized exercise prescription for aerobic and resistive training based on initial evaluation findings, risk stratification, comorbidities and participant's personal goals.       Expected Outcomes Short Term: Attend rehab on a regular basis to increase amount of physical activity.;Long Term: Add in home exercise to make exercise part of routine and to increase amount of physical activity.;Long Term: Exercising regularly at least 3-5 days a week.       Increase Strength and Stamina Yes       Intervention Provide advice, education, support and counseling about physical activity/exercise needs.;Develop an individualized exercise prescription for aerobic and resistive training based on initial evaluation findings, risk stratification, comorbidities and participant's personal goals.       Expected Outcomes Short Term: Increase workloads from initial exercise prescription for resistance, speed, and METs.;Short Term: Perform resistance training exercises routinely during rehab and add in resistance training at home;Long Term: Improve cardiorespiratory fitness, muscular endurance and strength as measured by increased METs and functional capacity ( )       Able to understand and use rate of perceived exertion (RPE) scale Yes       Intervention Provide education and explanation on how to use RPE scale       Expected Outcomes Short Term: Able to use RPE daily in rehab to express subjective intensity level;Long Term:  Able to use RPE to guide intensity level when exercising independently       Able to understand and use Dyspnea scale Yes       Intervention Provide education  and explanation on how to use Dyspnea scale       Expected Outcomes Short Term: Able to use Dyspnea scale daily in rehab to express subjective sense of shortness of breath during exertion;Long Term: Able to use Dyspnea scale to guide intensity level when exercising independently       Knowledge and understanding of Target Heart Rate Range (THRR) Yes       Intervention Provide education and explanation of THRR including how the numbers were predicted and where they are located for reference       Expected Outcomes Short Term: Able to state/look up THRR;Long Term: Able to use THRR to govern intensity when exercising independently;Short Term: Able to use daily as guideline for intensity in rehab       Able to check pulse independently Yes       Intervention Provide education and demonstration on how to check pulse in carotid and radial arteries.;Review the importance of being able to check your own pulse for safety during independent exercise       Expected Outcomes Long Term: Able to check pulse independently and accurately;Short Term: Able to explain why pulse checking is important during independent exercise       Understanding of Exercise Prescription Yes       Intervention Provide education, explanation, and written  materials on patient's individual exercise prescription       Expected Outcomes Short Term: Able to explain program exercise prescription;Long Term: Able to explain home exercise prescription to exercise independently                Exercise Goals Re-Evaluation :  Exercise Goals Re-Evaluation     Row Name 12/24/22 1453             Exercise Goal Re-Evaluation   Exercise Goals Review Increase Physical Activity;Understanding of Exercise Prescription;Increase Strength and Stamina       Comments Patient did not complete their first day of exercise yet. Patient had an elevated resting HR at his orientation and per Dr. Karna Christmas, had to get a holter ordered to rule out any  abnormalities. Patient needs to follow up with the cardiologist and get clearance from them and Dr. Mervyn Skeeters before starting rehab back up.       Expected Outcomes Short: Get clearance to rehab Long: Build up overall strength and stamina                Discharge Exercise Prescription (Final Exercise Prescription Changes):  Exercise Prescription Changes - 12/05/22 1600       Response to Exercise   Blood Pressure (Admit) 112/56    Blood Pressure (Exercise) 124/60    Blood Pressure (Exit) 106/60    Heart Rate (Admit) 114 bpm    Heart Rate (Exercise) 125 bpm    Heart Rate (Exit) 111 bpm    Oxygen Saturation (Admit) 94 %    Oxygen Saturation (Exercise) 87 %    Oxygen Saturation (Exit) 96 %    Rating of Perceived Exertion (Exercise) 11    Perceived Dyspnea (Exercise) 1    Symptoms SOB    Comments walk test results             Nutrition:  Target Goals: Understanding of nutrition guidelines, daily intake of sodium 1500mg , cholesterol 200mg , calories 30% from fat and 7% or less from saturated fats, daily to have 5 or more servings of fruits and vegetables.  Education: All About Nutrition: -Group instruction provided by verbal, written material, interactive activities, discussions, models, and posters to present general guidelines for heart healthy nutrition including fat, fiber, MyPlate, the role of sodium in heart healthy nutrition, utilization of the nutrition label, and utilization of this knowledge for meal planning. Follow up email sent as well. Written material given at graduation. Flowsheet Row Pulmonary Rehab from 12/05/2022 in [facility] Cardiac and Pulmonary Rehab  Education need identified 12/05/22       Biometrics:  Pre Biometrics - 12/05/22 1630       Pre Biometrics   Height 5' 11.5" (1.816 m)    Weight 180 lb 8 oz (81.9 kg)    Waist Circumference --   declined   Hip Circumference --   declined   BMI (Calculated) 24.83    Single Leg Stand 5.5 seconds               Nutrition Therapy Plan and Nutrition Goals:  Nutrition Therapy & Goals - 12/05/22 1619       Intervention Plan   Intervention Prescribe, educate and counsel regarding individualized specific dietary modifications aiming towards targeted core components such as weight, hypertension, lipid management, diabetes, heart failure and other comorbidities.    Expected Outcomes Short Term Goal: Understand basic principles of dietary content, such as calories, fat, sodium, cholesterol and nutrients.;Short Term Goal: A plan has been developed with  personal nutrition goals set during dietitian appointment.;Long Term Goal: Adherence to prescribed nutrition plan.             Nutrition Assessments:  MEDIFICTS Score Key: ?70 Need to make dietary changes  40-70 Heart Healthy Diet ? 40 Therapeutic Level Cholesterol Diet  Flowsheet Row Pulmonary Rehab from 12/05/2022 in [facility] Cardiac and Pulmonary Rehab  Picture Your Plate Total Score on Admission 81      Picture Your Plate Scores: <81 Unhealthy dietary pattern with much room for improvement. 41-50 Dietary pattern unlikely to meet recommendations for good health and room for improvement. 51-60 More healthful dietary pattern, with some room for improvement.  >60 Healthy dietary pattern, although there may be some specific behaviors that could be improved.   Nutrition Goals Re-Evaluation:   Nutrition Goals Discharge (Final Nutrition Goals Re-Evaluation):   Psychosocial: Target Goals: Acknowledge presence or absence of significant depression and/or stress, maximize coping skills, provide positive support system. Participant is able to verbalize types and ability to use techniques and skills needed for reducing stress and depression.   Education: Stress, Anxiety, and Depression - Group verbal and visual presentation to define topics covered.  Reviews how body is impacted by stress, anxiety, and depression.  Also discusses healthy ways to  reduce stress and to treat/manage anxiety and depression.  Written material given at graduation.   Education: Sleep Hygiene -Provides group verbal and written instruction about how sleep can affect your health.  Define sleep hygiene, discuss sleep cycles and impact of sleep habits. Review good sleep hygiene tips.    Initial Review & Psychosocial Screening:  Initial Psych Review & Screening - 12/03/22 1033       Initial Review   Current issues with None Identified      Family Dynamics   Good Support System? Yes   wife, family     Barriers   Psychosocial barriers to participate in program There are no identifiable barriers or psychosocial needs.      Screening Interventions   Interventions Provide feedback about the scores to participant;Encouraged to exercise;To provide support and resources with identified psychosocial needs    Expected Outcomes Short Term goal: Utilizing psychosocial counselor, staff and physician to assist with identification of specific Stressors or current issues interfering with healing process. Setting desired goal for each stressor or current issue identified.;Long Term Goal: Stressors or current issues are controlled or eliminated.;Short Term goal: Identification and review with participant of any Quality of Life or Depression concerns found by scoring the questionnaire.;Long Term goal: The participant improves quality of Life and PHQ9 Scores as seen by post scores and/or verbalization of changes             Quality of Life Scores:  Scores of 19 and below usually indicate a poorer quality of life in these areas.  A difference of  2-3 points is a clinically meaningful difference.  A difference of 2-3 points in the total score of the Quality of Life Index has been associated with significant improvement in overall quality of life, self-image, physical symptoms, and general health in studies assessing change in quality of life.  PHQ-9: Review Flowsheet  More  data exists      12/05/2022 07/01/2022 05/01/2022 07/01/2019 06/24/2018  Depression screen PHQ 2/9  Decreased Interest 0 0 0 0 0  Down, Depressed, Hopeless 0 0 0 0 1  PHQ - 2 Score 0 0 0 0 1  Altered sleeping 0 0 0 - -  Tired, decreased energy  1 0 0 - -  Change in appetite 0 0 0 - -  Feeling bad or failure about yourself  0 0 0 - -  Trouble concentrating 0 0 0 - -  Moving slowly or fidgety/restless 0 0 0 - -  Suicidal thoughts 0 0 0 - -  PHQ-9 Score 1 0 0 - -  Difficult doing work/chores Not difficult at all Not difficult at all Not difficult at all - -   Interpretation of Total Score  Total Score Depression Severity:  1-4 = Minimal depression, 5-9 = Mild depression, 10-14 = Moderate depression, 15-19 = Moderately severe depression, 20-27 = Severe depression   Psychosocial Evaluation and Intervention:  Psychosocial Evaluation - 12/03/22 1033       Psychosocial Evaluation & Interventions   Comments Mr. Rashed is coming to pulmonary rehab for COPD. He has been on oxygen for 12 years and feels comfortable managing it. After his most recent hospitalization, PT came to his house to show him some things. However, he and his wife admit that it wasn't too much to go off of and mainly he doesn't feel motivated to do the exercises alone. So they are both ready for him to get started in the program. He likes people and being in a group setting. He and his wife manage his health care needs and he states he feels like he is in a good spot mentally. He and his wife joked that the biggest stressor in his life right now is that she won't let him have the dessert he wants every night. He is motivated to attend the program    Expected Outcomes Short: attend pulmonary rehab for education and exercise. Long: develop and maintain positive self care habits.    Continue Psychosocial Services  Follow up required by staff             Psychosocial Re-Evaluation:   Psychosocial Discharge (Final  Psychosocial Re-Evaluation):   Education: Education Goals: Education classes will be provided on a weekly basis, covering required topics. Participant will state understanding/return demonstration of topics presented.  Learning Barriers/Preferences:  Learning Barriers/Preferences - 12/03/22 1014       Learning Barriers/Preferences   Learning Barriers Hearing    Learning Preferences Individual Instruction             General Pulmonary Education Topics:  Infection Prevention: - Provides verbal and written material to individual with discussion of infection control including proper hand washing and proper equipment cleaning during exercise session. Flowsheet Row Pulmonary Rehab from 12/05/2022 in [facility] Cardiac and Pulmonary Rehab  Education need identified 12/05/22  Date 12/05/22  Educator KW  Instruction Review Code 1- Verbalizes Understanding       Falls Prevention: - Provides verbal and written material to individual with discussion of falls prevention and safety. Flowsheet Row Pulmonary Rehab from 12/05/2022 in [facility]  Education need identified 12/05/22  Date 12/05/22  Educator KW  Instruction Review Code 1- Verbalizes Understanding       Chronic Lung Disease Review: - Group verbal instruction with posters, models, PowerPoint presentations and videos,  to review new updates, new respiratory medications, new advancements in procedures and treatments. Providing information on websites and "800" numbers for continued self-education. Includes information about supplement oxygen, available portable oxygen systems, continuous and intermittent flow rates, oxygen safety, concentrators, and Medicare reimbursement for oxygen. Explanation of Pulmonary Drugs, including class, frequency, complications, importance of spacers, rinsing mouth after steroid MDI's, and proper cleaning methods  for nebulizers. Review of basic lung anatomy and physiology related to  function, structure, and complications of lung disease. Review of risk factors. Discussion about methods for diagnosing sleep apnea and types of masks and machines for OSA. Includes a review of the use of types of environmental controls: home humidity, furnaces, filters, dust mite/pet prevention, HEPA vacuums. Discussion about weather changes, air quality and the benefits of nasal washing. Instruction on Warning signs, infection symptoms, calling MD promptly, preventive modes, and value of vaccinations. Review of effective airway clearance, coughing and/or vibration techniques. Emphasizing that all should Create an Action Plan. Written material given at graduation. Flowsheet Row Pulmonary Rehab from 12/05/2022 in [facility]  Education need identified 12/05/22       AED/CPR: - Group verbal and written instruction with the use of models to demonstrate the basic use of the AED with the basic ABC's of resuscitation.    Anatomy and Cardiac Procedures: - Group verbal and visual presentation and models provide information about basic cardiac anatomy and function. Reviews the testing methods done to diagnose heart disease and the outcomes of the test results. Describes the treatment choices: Medical Management, Angioplasty, or Coronary Bypass Surgery for treating various heart conditions including Myocardial Infarction, Angina, Valve Disease, and Cardiac Arrhythmias.  Written material given at graduation.   Medication Safety: - Group verbal and visual instruction to review commonly prescribed medications for heart and lung disease. Reviews the medication, class of the drug, and side effects. Includes the steps to properly store meds and maintain the prescription regimen.  Written material given at graduation.   Other: -Provides group and verbal instruction on various topics (see comments)   Knowledge Questionnaire Score:  Knowledge Questionnaire Score - 12/05/22 1617        Knowledge Questionnaire Score   Pre Score 16/18              Core Components/Risk Factors/Patient Goals at Admission:  Personal Goals and Risk Factors at Admission - 12/05/22 1633       Core Components/Risk Factors/Patient Goals on Admission    Weight Management Yes;Weight Maintenance   Already lost 14 lbs since hospital visit   Intervention Weight Management: Develop a combined nutrition and exercise program designed to reach desired caloric intake, while maintaining appropriate intake of nutrient and fiber, sodium and fats, and appropriate energy expenditure required for the weight goal.;Weight Management: Provide education and appropriate resources to help participant work on and attain dietary goals.;Weight Management/Obesity: Establish reasonable short term and long term weight goals.    Admit Weight 180 lb (81.6 kg)    Goal Weight: Short Term 180 lb (81.6 kg)    Goal Weight: Long Term 180 lb (81.6 kg)    Expected Outcomes Short Term: Continue to assess and modify interventions until short term weight is achieved;Long Term: Adherence to nutrition and physical activity/exercise program aimed toward attainment of established weight goal;Weight Maintenance: Understanding of the daily nutrition guidelines, which includes 25-35% calories from fat, 7% or less cal from saturated fats, less than 200mg  cholesterol, less than 1.5gm of sodium, & 5 or more servings of fruits and vegetables daily;Understanding recommendations for meals to include 15-35% energy as protein, 25-35% energy from fat, 35-60% energy from carbohydrates, less than 200mg  of dietary cholesterol, 20-35 gm of total fiber daily;Understanding of distribution of calorie intake throughout the day with the consumption of 4-5 meals/snacks    Improve shortness of breath with ADL's Yes    Intervention Provide education, individualized exercise  plan and daily activity instruction to help decrease symptoms of SOB with activities of daily  living.    Expected Outcomes Short Term: Improve cardiorespiratory fitness to achieve a reduction of symptoms when performing ADLs;Long Term: Be able to perform more ADLs without symptoms or delay the onset of symptoms    Hypertension Yes    Intervention Provide education on lifestyle modifcations including regular physical activity/exercise, weight management, moderate sodium restriction and increased consumption of fresh fruit, vegetables, and low fat dairy, alcohol moderation, and smoking cessation.;Monitor prescription use compliance.    Expected Outcomes Short Term: Continued assessment and intervention until BP is < 140/42mm HG in hypertensive participants. < 130/67mm HG in hypertensive participants with diabetes, heart failure or chronic kidney disease.;Long Term: Maintenance of blood pressure at goal levels.    Lipids Yes    Intervention Provide education and support for participant on nutrition & aerobic/resistive exercise along with prescribed medications to achieve LDL 70mg , HDL >40mg .    Expected Outcomes Short Term: Participant states understanding of desired cholesterol values and is compliant with medications prescribed. Participant is following exercise prescription and nutrition guidelines.;Long Term: Cholesterol controlled with medications as prescribed, with individualized exercise RX and with personalized nutrition plan. Value goals: LDL < 70mg , HDL > 40 mg.             Education:Diabetes - Individual verbal and written instruction to review signs/symptoms of diabetes, desired ranges of glucose level fasting, after meals and with exercise. Acknowledge that pre and post exercise glucose checks will be done for 3 sessions at entry of program.   Know Your Numbers and Heart Failure: - Group verbal and visual instruction to discuss disease risk factors for cardiac and pulmonary disease and treatment options.  Reviews associated critical values for Overweight/Obesity, Hypertension,  Cholesterol, and Diabetes.  Discusses basics of heart failure: signs/symptoms and treatments.  Introduces Heart Failure Zone chart for action plan for heart failure.  Written material given at graduation.   Core Components/Risk Factors/Patient Goals Review:    Core Components/Risk Factors/Patient Goals at Discharge (Final Review):    ITP Comments:  ITP Comments     Row Name 12/03/22 1037 12/05/22 1612 12/09/22 1242 12/19/22 1117 12/25/22 0759   ITP Comments Initial phone call completed. Diagnosis can be found in [facility] 4/11. EP Orientation scheduled for Thursday 4/25 at 2pm. Completed and gym orientation. Initial ITP created and sent for review to Dr. Vida Rigger, Medical Director. Per Dr. Karna Christmas, pause pulmonary rehab appointments- would like to order a holter monitor and be evaluated and cleared to go from a cardiac stand point until he returns. Pt was tachycardic at orient. Left message for patient and wife to call back to discuss appts. They saw Dr. Roberts Gaudy nurse on 4/26. Called and spoke with patient's wife- patient is due to see a cardiologist on 6/3 to follow up on holter monitor results. Patient needs clearance from cardiologist and Dr. Karna Christmas before starting pulmonary rehab. Wife and patient aware they need written clearance from both MDs. Wife and patient will call us after appt to keep [place] updated. Placing patient on medical hold at this time before starting rehab. 30 Day review completed. Medical Director ITP review done, changes made as directed, and signed approval by Medical Director.   remains on medical hold            Comments:

## 2022-12-27 ENCOUNTER — Ambulatory Visit: Payer: Medicare HMO

## 2022-12-30 ENCOUNTER — Ambulatory Visit: Payer: Medicare HMO

## 2023-01-01 ENCOUNTER — Ambulatory Visit: Payer: Medicare HMO

## 2023-01-01 DIAGNOSIS — C4359 Malignant melanoma of other part of trunk: Secondary | ICD-10-CM | POA: Diagnosis not present

## 2023-01-01 DIAGNOSIS — D0359 Melanoma in situ of other part of trunk: Secondary | ICD-10-CM | POA: Diagnosis not present

## 2023-01-03 ENCOUNTER — Ambulatory Visit: Payer: Medicare HMO

## 2023-01-04 DIAGNOSIS — J449 Chronic obstructive pulmonary disease, unspecified: Secondary | ICD-10-CM | POA: Diagnosis not present

## 2023-01-04 DIAGNOSIS — J432 Centrilobular emphysema: Secondary | ICD-10-CM | POA: Diagnosis not present

## 2023-01-08 ENCOUNTER — Ambulatory Visit: Payer: Medicare HMO

## 2023-01-10 ENCOUNTER — Ambulatory Visit: Payer: Medicare HMO

## 2023-01-12 DIAGNOSIS — J9621 Acute and chronic respiratory failure with hypoxia: Secondary | ICD-10-CM | POA: Diagnosis not present

## 2023-01-12 DIAGNOSIS — J449 Chronic obstructive pulmonary disease, unspecified: Secondary | ICD-10-CM | POA: Diagnosis not present

## 2023-01-13 ENCOUNTER — Ambulatory Visit: Payer: Medicare HMO

## 2023-01-13 DIAGNOSIS — R0609 Other forms of dyspnea: Secondary | ICD-10-CM | POA: Diagnosis not present

## 2023-01-13 DIAGNOSIS — I10 Essential (primary) hypertension: Secondary | ICD-10-CM | POA: Diagnosis not present

## 2023-01-13 DIAGNOSIS — J441 Chronic obstructive pulmonary disease with (acute) exacerbation: Secondary | ICD-10-CM | POA: Diagnosis not present

## 2023-01-13 DIAGNOSIS — E7849 Other hyperlipidemia: Secondary | ICD-10-CM | POA: Diagnosis not present

## 2023-01-15 ENCOUNTER — Ambulatory Visit: Payer: Medicare HMO

## 2023-01-16 ENCOUNTER — Telehealth: Payer: Self-pay | Admitting: *Deleted

## 2023-01-16 ENCOUNTER — Encounter: Payer: Self-pay | Admitting: *Deleted

## 2023-01-16 DIAGNOSIS — J449 Chronic obstructive pulmonary disease, unspecified: Secondary | ICD-10-CM

## 2023-01-16 NOTE — Telephone Encounter (Signed)
Called pt to follow up from MD appt for clearance to start rehab.  Note show no findings and needing rehab.  Left message for patient.

## 2023-01-17 ENCOUNTER — Ambulatory Visit: Payer: Medicare HMO

## 2023-01-20 ENCOUNTER — Ambulatory Visit: Payer: Medicare HMO

## 2023-01-20 DIAGNOSIS — J449 Chronic obstructive pulmonary disease, unspecified: Secondary | ICD-10-CM | POA: Diagnosis not present

## 2023-01-22 ENCOUNTER — Ambulatory Visit: Payer: Medicare HMO

## 2023-01-22 ENCOUNTER — Encounter: Payer: Self-pay | Admitting: *Deleted

## 2023-01-22 DIAGNOSIS — J449 Chronic obstructive pulmonary disease, unspecified: Secondary | ICD-10-CM

## 2023-01-22 NOTE — Progress Notes (Signed)
Pulmonary Individual Treatment Plan  Patient Details  Name: Gregory Haynes MRN: [id number] Date of Birth: 05-31-1941 Referring Provider:    Initial Encounter Date:  Flowsheet Row Pulmonary Rehab from 12/05/2022 in [facility] Cardiac and Pulmonary Rehab  Date 12/05/22       Visit Diagnosis: Chronic obstructive pulmonary disease, unspecified COPD type (HCC)  Patient's Home Medications on Admission:  Current Outpatient Medications:    acidophilus (RISAQUAD) CAPS capsule, Take 1 capsule by mouth daily., Disp: , Rfl:    albuterol (VENTOLIN HFA) 108 (90 Base) MCG/ACT inhaler, Inhale 1-2 puffs into the lungs every 4 (four) hours as needed for wheezing or shortness of breath., Disp: 18 g, Rfl: 3   atorvastatin (LIPITOR) 40 MG tablet, Take 1 tablet (40 mg total) by mouth every evening., Disp: 90 tablet, Rfl: 3   carvedilol (COREG) 3.125 MG tablet, Take 1 tablet (3.125 mg total) by mouth 2 (two) times daily with a meal., Disp: 60 tablet, Rfl: 1   clobetasol cream (TEMOVATE) 0.05 %, Apply 1 Application topically 2 (two) times daily., Disp: , Rfl:    feeding supplement (ENSURE ENLIVE / ENSURE PLUS) LIQD, Take 237 mLs by mouth 3 (three) times daily. (Patient not taking: Reported on 12/03/2022), Disp: 237 mL, Rfl: 12   Fluticasone-Umeclidin-Vilant (TRELEGY ELLIPTA) 100-62.5-25 MCG/INH AEPB, Inhale 1 puff into the lungs daily., Disp: , Rfl:    folic acid (FOLVITE) 1 MG tablet, Take 1 mg by mouth daily. Unsure dose (Patient not taking: Reported on 12/03/2022), Disp: , Rfl:    ipratropium-albuterol (DUONEB) 0.5-2.5 (3) MG/3ML SOLN, Inhale into the lungs., Disp: , Rfl:    lisinopril (ZESTRIL) 2.5 MG tablet, Take 1 tablet (2.5 mg total) by mouth daily. (Patient not taking: Reported on 12/03/2022), Disp: 30 tablet, Rfl: 0   OXYGEN, Inhale 2 L/hr into the lungs. At night and as needed during day., Disp: , Rfl:   Past Medical History: Past Medical History:  Diagnosis Date   Anemia    Cancer (HCC)    BCC    Cataract    COPD (chronic obstructive pulmonary disease) (HCC)    Helicobacter pylori ab+    Hyperlipidemia    Hypertension    Psoriasis    Wears hearing aid in both ears     Tobacco Use: Social History   Tobacco Use  Smoking Status Former   Packs/day: 1.50   Years: 55.00   Additional pack years: 0.00   Total pack years: 82.50   Types: Cigarettes   Quit date: 08/13/2011   Years since quitting: 11.4  Smokeless Tobacco Never    Labs: Review Flowsheet  More data exists      Latest Ref Rng & Units 05/17/2020 05/01/2022 10/31/2022 11/01/2022 11/02/2022  Labs for ITP Cardiac and Pulmonary Rehab  Cholestrol 100 - 199 mg/dL 213  086  - - -  LDL (calc) 0 - 99 mg/dL 79  70  - - -  HDL-C >57 mg/dL 48  49  - - -  Trlycerides 0 - 149 mg/dL 76  84  - - -  Hemoglobin A1c 4.8 - 5.6 % - - 6.3  - -  PH, Arterial 7.35 - 7.45 - - 7.38  7.36  7.43  7.47   PCO2 arterial 32 - 48 mmHg - - 71  80  69  62   Bicarbonate 20.0 - 28.0 mmol/L - - 48.4  42.0  45.2  45.6  45.8  45.1   O2 Saturation % - - 67  98.3  95.3  94.7  98.9  99.2      Pulmonary Assessment Scores:  Pulmonary Assessment Scores     Row Name 12/05/22 1617         ADL UCSD   ADL Phase Entry     SOB Score total 27     Rest 0     Walk 0     Stairs 2     Bath 0     Dress 0     Shop 1       CAT Score   CAT Score 6       mMRC Score   mMRC Score 1              UCSD: Self-administered rating of dyspnea associated with activities of daily living (ADLs) 6-point scale (0 = "not at all" to 5 = "maximal or unable to do because of breathlessness")  Scoring Scores range from 0 to 120.  Minimally important difference is 5 units  CAT: CAT can identify the health impairment of COPD patients and is better correlated with disease progression.  CAT has a scoring range of zero to 40. The CAT score is classified into four groups of low (less than 10), medium (10 - 20), high (21-30) and very high (31-40) based on the impact level of  disease on health status. A CAT score over 10 suggests significant symptoms.  A worsening CAT score could be explained by an exacerbation, poor medication adherence, poor inhaler technique, or progression of COPD or comorbid conditions.  CAT MCID is 2 points  mMRC: mMRC (Modified Medical Research Council) Dyspnea Scale is used to assess the degree of baseline functional disability in patients of respiratory disease due to dyspnea. No minimal important difference is established. A decrease in score of 1 point or greater is considered a positive change.   Pulmonary Function Assessment:   Exercise Target Goals: Exercise Program Goal: Individual exercise prescription set using results from initial 6 min walk test and THRR while considering  patient's activity barriers and safety.   Exercise Prescription Goal: Initial exercise prescription builds to 30-45 minutes a day of aerobic activity, 2-3 days per week.  Home exercise guidelines will be given to patient during program as part of exercise prescription that the participant will acknowledge.  Education: Aerobic Exercise: - Group verbal and visual presentation on the components of exercise prescription. Introduces F.I.T.T principle from ACSM for exercise prescriptions.  Reviews F.I.T.T. principles of aerobic exercise including progression. Written material given at graduation.   Education: Resistance Exercise: - Group verbal and visual presentation on the components of exercise prescription. Introduces F.I.T.T principle from ACSM for exercise prescriptions  Reviews F.I.T.T. principles of resistance exercise including progression. Written material given at graduation.    Education: Exercise & Equipment Safety: - Individual verbal instruction and demonstration of equipment use and safety with use of the equipment. Flowsheet Row Pulmonary Rehab from 12/05/2022 in [facility] Cardiac and Pulmonary Rehab  Education need identified 12/05/22  Date 12/05/22   Educator KW  Instruction Review Code 1- Verbalizes Understanding       Education: Exercise Physiology & General Exercise Guidelines: - Group verbal and written instruction with models to review the exercise physiology of the cardiovascular system and associated critical values. Provides general exercise guidelines with specific guidelines to those with heart or lung disease.    Education: Flexibility, Balance, Mind/Body Relaxation: - Group verbal and visual presentation with interactive activity on the components of exercise prescription. Introduces F.I.T.T principle  from ACSM for exercise prescriptions. Reviews F.I.T.T. principles of flexibility and balance exercise training including progression. Also discusses the mind body connection.  Reviews various relaxation techniques to help reduce and manage stress (i.e. Deep breathing, progressive muscle relaxation, and visualization). Balance handout provided to take home. Written material given at graduation.   Activity Barriers & Risk Stratification:  Activity Barriers & Cardiac Risk Stratification - 12/05/22 1630       Activity Barriers & Cardiac Risk Stratification   Activity Barriers Shortness of Breath;Balance Concerns;Deconditioning;Muscular Weakness;Assistive Device             6 Minute Walk:  6 Minute Walk     Row Name 12/05/22 1620         6 Minute Walk   Phase Initial     Distance 890 feet     Walk Time 6 minutes     # of Rest Breaks 0     MPH 1.68     METS 2.01     RPE 11     Perceived Dyspnea  1     VO2 Peak 7.03     Symptoms Yes (comment)     Comments SOB     Resting HR 114 bpm     Resting BP 112/56     Resting Oxygen Saturation  94 %     Exercise Oxygen Saturation  during 6 min walk 87 %     Max Ex. HR 125 bpm     Max Ex. BP 124/60     2 Minute Post BP 106/60       Interval HR   1 Minute HR 122     2 Minute HR 124     3 Minute HR 125     4 Minute HR 124     5 Minute HR 123     6 Minute HR 125      2 Minute Post HR 111     Interval Heart Rate? Yes       Interval Oxygen   Interval Oxygen? Yes     Baseline Oxygen Saturation % 94 %     1 Minute Oxygen Saturation % 92 %     1 Minute Liters of Oxygen 2 L  continuous     2 Minute Oxygen Saturation % 90 %     2 Minute Liters of Oxygen 2 L     3 Minute Oxygen Saturation % 89 %     3 Minute Liters of Oxygen 2 L     4 Minute Oxygen Saturation % 88 %     4 Minute Liters of Oxygen 2 L     5 Minute Oxygen Saturation % 87 %     5 Minute Liters of Oxygen 2 L     6 Minute Oxygen Saturation % 87 %     6 Minute Liters of Oxygen 2 L     2 Minute Post Oxygen Saturation % 96 %     2 Minute Post Liters of Oxygen 2 L             Oxygen Initial Assessment:  Oxygen Initial Assessment - 12/05/22 1617       Home Oxygen   Home Oxygen Device Portable Concentrator;Home Concentrator    Sleep Oxygen Prescription Continuous;CPAP    Liters per minute 2    Home Exercise Oxygen Prescription Continuous    Liters per minute 2    Home Resting Oxygen Prescription Continuous    Liters  per minute 2    Compliance with Home Oxygen Use Yes      Initial 6 min Walk   Oxygen Used Pulsed    Liters per minute 2      Program Oxygen Prescription   Program Oxygen Prescription Continuous    Liters per minute 2      Intervention   Short Term Goals To learn and exhibit compliance with exercise, home and travel O2 prescription;To learn and understand importance of monitoring SPO2 with pulse oximeter and demonstrate accurate use of the pulse oximeter.;To learn and understand importance of maintaining oxygen saturations>88%;To learn and demonstrate proper pursed lip breathing techniques or other breathing techniques. ;To learn and demonstrate proper use of respiratory medications    Long  Term Goals Exhibits compliance with exercise, home  and travel O2 prescription;Verbalizes importance of monitoring SPO2 with pulse oximeter and return demonstration;Maintenance of  O2 saturations>88%;Exhibits proper breathing techniques, such as pursed lip breathing or other method taught during program session;Compliance with respiratory medication;Demonstrates proper use of MDI's             Oxygen Re-Evaluation:   Oxygen Discharge (Final Oxygen Re-Evaluation):   Initial Exercise Prescription:  Initial Exercise Prescription - 12/05/22 1600       Date of Initial Exercise RX and Referring Provider   Date 12/05/22      Oxygen   Oxygen Continuous    Liters 2    Maintain Oxygen Saturation 88% or higher      NuStep   Level 1    SPM 80    Minutes 15    METs 2      REL-XR   Level 1    Speed 50    Minutes 15    METs 2      Biostep-RELP   Level 1    SPM 50    Minutes 15    METs 2      Track   Laps 20    Minutes 15    METs 2.09      Prescription Details   Frequency (times per week) 3    Duration Progress to 30 minutes of continuous aerobic without signs/symptoms of physical distress      Intensity   THRR 40-80% of Max Heartrate 122- 133    Ratings of Perceived Exertion 11-13    Perceived Dyspnea 0-4      Progression   Progression Continue to progress workloads to maintain intensity without signs/symptoms of physical distress.      Resistance Training   Training Prescription Yes    Weight 3 lb    Reps 10-15             Perform Capillary Blood Glucose checks as needed.  Exercise Prescription Changes:   Exercise Prescription Changes     Row Name 12/05/22 1600             Response to Exercise   Blood Pressure (Admit) 112/56       Blood Pressure (Exercise) 124/60       Blood Pressure (Exit) 106/60       Heart Rate (Admit) 114 bpm       Heart Rate (Exercise) 125 bpm       Heart Rate (Exit) 111 bpm       Oxygen Saturation (Admit) 94 %       Oxygen Saturation (Exercise) 87 %       Oxygen Saturation (Exit) 96 %       Rating  of Perceived Exertion (Exercise) 11       Perceived Dyspnea (Exercise) 1       Symptoms SOB        Comments walk test results                Exercise Comments:   Exercise Goals and Review:   Exercise Goals     Row Name 12/05/22 1632             Exercise Goals   Increase Physical Activity Yes       Intervention Provide advice, education, support and counseling about physical activity/exercise needs.;Develop an individualized exercise prescription for aerobic and resistive training based on initial evaluation findings, risk stratification, comorbidities and participant's personal goals.       Expected Outcomes Short Term: Attend rehab on a regular basis to increase amount of physical activity.;Long Term: Add in home exercise to make exercise part of routine and to increase amount of physical activity.;Long Term: Exercising regularly at least 3-5 days a week.       Increase Strength and Stamina Yes       Intervention Provide advice, education, support and counseling about physical activity/exercise needs.;Develop an individualized exercise prescription for aerobic and resistive training based on initial evaluation findings, risk stratification, comorbidities and participant's personal goals.       Expected Outcomes Short Term: Increase workloads from initial exercise prescription for resistance, speed, and METs.;Short Term: Perform resistance training exercises routinely during rehab and add in resistance training at home;Long Term: Improve cardiorespiratory fitness, muscular endurance and strength as measured by increased METs and functional capacity ( )       Able to understand and use rate of perceived exertion (RPE) scale Yes       Intervention Provide education and explanation on how to use RPE scale       Expected Outcomes Short Term: Able to use RPE daily in rehab to express subjective intensity level;Long Term:  Able to use RPE to guide intensity level when exercising independently       Able to understand and use Dyspnea scale Yes       Intervention Provide education  and explanation on how to use Dyspnea scale       Expected Outcomes Short Term: Able to use Dyspnea scale daily in rehab to express subjective sense of shortness of breath during exertion;Long Term: Able to use Dyspnea scale to guide intensity level when exercising independently       Knowledge and understanding of Target Heart Rate Range (THRR) Yes       Intervention Provide education and explanation of THRR including how the numbers were predicted and where they are located for reference       Expected Outcomes Short Term: Able to state/look up THRR;Long Term: Able to use THRR to govern intensity when exercising independently;Short Term: Able to use daily as guideline for intensity in rehab       Able to check pulse independently Yes       Intervention Provide education and demonstration on how to check pulse in carotid and radial arteries.;Review the importance of being able to check your own pulse for safety during independent exercise       Expected Outcomes Long Term: Able to check pulse independently and accurately;Short Term: Able to explain why pulse checking is important during independent exercise       Understanding of Exercise Prescription Yes       Intervention Provide education, explanation, and written  materials on patient's individual exercise prescription       Expected Outcomes Short Term: Able to explain program exercise prescription;Long Term: Able to explain home exercise prescription to exercise independently                Exercise Goals Re-Evaluation :  Exercise Goals Re-Evaluation     Row Name 12/24/22 1453             Exercise Goal Re-Evaluation   Exercise Goals Review Increase Physical Activity;Understanding of Exercise Prescription;Increase Strength and Stamina       Comments Patient did not complete their first day of exercise yet. Patient had an elevated resting HR at his orientation and per Dr. Karna Christmas, had to get a holter ordered to rule out any  abnormalities. Patient needs to follow up with the cardiologist and get clearance from them and Dr. Mervyn Skeeters before starting rehab back up.       Expected Outcomes Short: Get clearance to rehab Long: Build up overall strength and stamina                Discharge Exercise Prescription (Final Exercise Prescription Changes):  Exercise Prescription Changes - 12/05/22 1600       Response to Exercise   Blood Pressure (Admit) 112/56    Blood Pressure (Exercise) 124/60    Blood Pressure (Exit) 106/60    Heart Rate (Admit) 114 bpm    Heart Rate (Exercise) 125 bpm    Heart Rate (Exit) 111 bpm    Oxygen Saturation (Admit) 94 %    Oxygen Saturation (Exercise) 87 %    Oxygen Saturation (Exit) 96 %    Rating of Perceived Exertion (Exercise) 11    Perceived Dyspnea (Exercise) 1    Symptoms SOB    Comments walk test results             Nutrition:  Target Goals: Understanding of nutrition guidelines, daily intake of sodium 1500mg , cholesterol 200mg , calories 30% from fat and 7% or less from saturated fats, daily to have 5 or more servings of fruits and vegetables.  Education: All About Nutrition: -Group instruction provided by verbal, written material, interactive activities, discussions, models, and posters to present general guidelines for heart healthy nutrition including fat, fiber, MyPlate, the role of sodium in heart healthy nutrition, utilization of the nutrition label, and utilization of this knowledge for meal planning. Follow up email sent as well. Written material given at graduation. Flowsheet Row Pulmonary Rehab from 12/05/2022 in [facility] Cardiac and Pulmonary Rehab  Education need identified 12/05/22       Biometrics:  Pre Biometrics - 12/05/22 1630       Pre Biometrics   Height 5' 11.5" (1.816 m)    Weight 180 lb 8 oz (81.9 kg)    Waist Circumference --   declined   Hip Circumference --   declined   BMI (Calculated) 24.83    Single Leg Stand 5.5 seconds               Nutrition Therapy Plan and Nutrition Goals:  Nutrition Therapy & Goals - 12/05/22 1619       Intervention Plan   Intervention Prescribe, educate and counsel regarding individualized specific dietary modifications aiming towards targeted core components such as weight, hypertension, lipid management, diabetes, heart failure and other comorbidities.    Expected Outcomes Short Term Goal: Understand basic principles of dietary content, such as calories, fat, sodium, cholesterol and nutrients.;Short Term Goal: A plan has been developed with  personal nutrition goals set during dietitian appointment.;Long Term Goal: Adherence to prescribed nutrition plan.             Nutrition Assessments:  MEDIFICTS Score Key: ?70 Need to make dietary changes  40-70 Heart Healthy Diet ? 40 Therapeutic Level Cholesterol Diet  Flowsheet Row Pulmonary Rehab from 12/05/2022 in [facility] Cardiac and Pulmonary Rehab  Picture Your Plate Total Score on Admission 81      Picture Your Plate Scores: <40 Unhealthy dietary pattern with much room for improvement. 41-50 Dietary pattern unlikely to meet recommendations for good health and room for improvement. 51-60 More healthful dietary pattern, with some room for improvement.  >60 Healthy dietary pattern, although there may be some specific behaviors that could be improved.   Nutrition Goals Re-Evaluation:   Nutrition Goals Discharge (Final Nutrition Goals Re-Evaluation):   Psychosocial: Target Goals: Acknowledge presence or absence of significant depression and/or stress, maximize coping skills, provide positive support system. Participant is able to verbalize types and ability to use techniques and skills needed for reducing stress and depression.   Education: Stress, Anxiety, and Depression - Group verbal and visual presentation to define topics covered.  Reviews how body is impacted by stress, anxiety, and depression.  Also discusses healthy ways to  reduce stress and to treat/manage anxiety and depression.  Written material given at graduation.   Education: Sleep Hygiene -Provides group verbal and written instruction about how sleep can affect your health.  Define sleep hygiene, discuss sleep cycles and impact of sleep habits. Review good sleep hygiene tips.    Initial Review & Psychosocial Screening:  Initial Psych Review & Screening - 12/03/22 1033       Initial Review   Current issues with None Identified      Family Dynamics   Good Support System? Yes   wife, family     Barriers   Psychosocial barriers to participate in program There are no identifiable barriers or psychosocial needs.      Screening Interventions   Interventions Provide feedback about the scores to participant;Encouraged to exercise;To provide support and resources with identified psychosocial needs    Expected Outcomes Short Term goal: Utilizing psychosocial counselor, staff and physician to assist with identification of specific Stressors or current issues interfering with healing process. Setting desired goal for each stressor or current issue identified.;Long Term Goal: Stressors or current issues are controlled or eliminated.;Short Term goal: Identification and review with participant of any Quality of Life or Depression concerns found by scoring the questionnaire.;Long Term goal: The participant improves quality of Life and PHQ9 Scores as seen by post scores and/or verbalization of changes             Quality of Life Scores:  Scores of 19 and below usually indicate a poorer quality of life in these areas.  A difference of  2-3 points is a clinically meaningful difference.  A difference of 2-3 points in the total score of the Quality of Life Index has been associated with significant improvement in overall quality of life, self-image, physical symptoms, and general health in studies assessing change in quality of life.  PHQ-9: Review Flowsheet  More  data exists      12/05/2022 07/01/2022 05/01/2022 07/01/2019 06/24/2018  Depression screen PHQ 2/9  Decreased Interest 0 0 0 0 0  Down, Depressed, Hopeless 0 0 0 0 1  PHQ - 2 Score 0 0 0 0 1  Altered sleeping 0 0 0 - -  Tired, decreased energy  1 0 0 - -  Change in appetite 0 0 0 - -  Feeling bad or failure about yourself  0 0 0 - -  Trouble concentrating 0 0 0 - -  Moving slowly or fidgety/restless 0 0 0 - -  Suicidal thoughts 0 0 0 - -  PHQ-9 Score 1 0 0 - -  Difficult doing work/chores Not difficult at all Not difficult at all Not difficult at all - -   Interpretation of Total Score  Total Score Depression Severity:  1-4 = Minimal depression, 5-9 = Mild depression, 10-14 = Moderate depression, 15-19 = Moderately severe depression, 20-27 = Severe depression   Psychosocial Evaluation and Intervention:  Psychosocial Evaluation - 12/03/22 1033       Psychosocial Evaluation & Interventions   Comments Mr. Rashed is coming to pulmonary rehab for COPD. He has been on oxygen for 12 years and feels comfortable managing it. After his most recent hospitalization, PT came to his house to show him some things. However, he and his wife admit that it wasn't too much to go off of and mainly he doesn't feel motivated to do the exercises alone. So they are both ready for him to get started in the program. He likes people and being in a group setting. He and his wife manage his health care needs and he states he feels like he is in a good spot mentally. He and his wife joked that the biggest stressor in his life right now is that she won't let him have the dessert he wants every night. He is motivated to attend the program    Expected Outcomes Short: attend pulmonary rehab for education and exercise. Long: develop and maintain positive self care habits.    Continue Psychosocial Services  Follow up required by staff             Psychosocial Re-Evaluation:   Psychosocial Discharge (Final  Psychosocial Re-Evaluation):   Education: Education Goals: Education classes will be provided on a weekly basis, covering required topics. Participant will state understanding/return demonstration of topics presented.  Learning Barriers/Preferences:  Learning Barriers/Preferences - 12/03/22 1014       Learning Barriers/Preferences   Learning Barriers Hearing    Learning Preferences Individual Instruction             General Pulmonary Education Topics:  Infection Prevention: - Provides verbal and written material to individual with discussion of infection control including proper hand washing and proper equipment cleaning during exercise session. Flowsheet Row Pulmonary Rehab from 12/05/2022 in [facility] Cardiac and Pulmonary Rehab  Education need identified 12/05/22  Date 12/05/22  Educator KW  Instruction Review Code 1- Verbalizes Understanding       Falls Prevention: - Provides verbal and written material to individual with discussion of falls prevention and safety. Flowsheet Row Pulmonary Rehab from 12/05/2022 in [facility]  Education need identified 12/05/22  Date 12/05/22  Educator KW  Instruction Review Code 1- Verbalizes Understanding       Chronic Lung Disease Review: - Group verbal instruction with posters, models, PowerPoint presentations and videos,  to review new updates, new respiratory medications, new advancements in procedures and treatments. Providing information on websites and "800" numbers for continued self-education. Includes information about supplement oxygen, available portable oxygen systems, continuous and intermittent flow rates, oxygen safety, concentrators, and Medicare reimbursement for oxygen. Explanation of Pulmonary Drugs, including class, frequency, complications, importance of spacers, rinsing mouth after steroid MDI's, and proper cleaning methods  for nebulizers. Review of basic lung anatomy and physiology related to  function, structure, and complications of lung disease. Review of risk factors. Discussion about methods for diagnosing sleep apnea and types of masks and machines for OSA. Includes a review of the use of types of environmental controls: home humidity, furnaces, filters, dust mite/pet prevention, HEPA vacuums. Discussion about weather changes, air quality and the benefits of nasal washing. Instruction on Warning signs, infection symptoms, calling MD promptly, preventive modes, and value of vaccinations. Review of effective airway clearance, coughing and/or vibration techniques. Emphasizing that all should Create an Action Plan. Written material given at graduation. Flowsheet Row Pulmonary Rehab from 12/05/2022 in [facility]  Education need identified 12/05/22       AED/CPR: - Group verbal and written instruction with the use of models to demonstrate the basic use of the AED with the basic ABC's of resuscitation.    Anatomy and Cardiac Procedures: - Group verbal and visual presentation and models provide information about basic cardiac anatomy and function. Reviews the testing methods done to diagnose heart disease and the outcomes of the test results. Describes the treatment choices: Medical Management, Angioplasty, or Coronary Bypass Surgery for treating various heart conditions including Myocardial Infarction, Angina, Valve Disease, and Cardiac Arrhythmias.  Written material given at graduation.   Medication Safety: - Group verbal and visual instruction to review commonly prescribed medications for heart and lung disease. Reviews the medication, class of the drug, and side effects. Includes the steps to properly store meds and maintain the prescription regimen.  Written material given at graduation.   Other: -Provides group and verbal instruction on various topics (see comments)   Knowledge Questionnaire Score:  Knowledge Questionnaire Score - 12/05/22 1617        Knowledge Questionnaire Score   Pre Score 16/18              Core Components/Risk Factors/Patient Goals at Admission:  Personal Goals and Risk Factors at Admission - 12/05/22 1633       Core Components/Risk Factors/Patient Goals on Admission    Weight Management Yes;Weight Maintenance   Already lost 14 lbs since hospital visit   Intervention Weight Management: Develop a combined nutrition and exercise program designed to reach desired caloric intake, while maintaining appropriate intake of nutrient and fiber, sodium and fats, and appropriate energy expenditure required for the weight goal.;Weight Management: Provide education and appropriate resources to help participant work on and attain dietary goals.;Weight Management/Obesity: Establish reasonable short term and long term weight goals.    Admit Weight 180 lb (81.6 kg)    Goal Weight: Short Term 180 lb (81.6 kg)    Goal Weight: Long Term 180 lb (81.6 kg)    Expected Outcomes Short Term: Continue to assess and modify interventions until short term weight is achieved;Long Term: Adherence to nutrition and physical activity/exercise program aimed toward attainment of established weight goal;Weight Maintenance: Understanding of the daily nutrition guidelines, which includes 25-35% calories from fat, 7% or less cal from saturated fats, less than 200mg  cholesterol, less than 1.5gm of sodium, & 5 or more servings of fruits and vegetables daily;Understanding recommendations for meals to include 15-35% energy as protein, 25-35% energy from fat, 35-60% energy from carbohydrates, less than 200mg  of dietary cholesterol, 20-35 gm of total fiber daily;Understanding of distribution of calorie intake throughout the day with the consumption of 4-5 meals/snacks    Improve shortness of breath with ADL's Yes    Intervention Provide education, individualized exercise  plan and daily activity instruction to help decrease symptoms of SOB with activities of daily  living.    Expected Outcomes Short Term: Improve cardiorespiratory fitness to achieve a reduction of symptoms when performing ADLs;Long Term: Be able to perform more ADLs without symptoms or delay the onset of symptoms    Hypertension Yes    Intervention Provide education on lifestyle modifcations including regular physical activity/exercise, weight management, moderate sodium restriction and increased consumption of fresh fruit, vegetables, and low fat dairy, alcohol moderation, and smoking cessation.;Monitor prescription use compliance.    Expected Outcomes Short Term: Continued assessment and intervention until BP is < 140/16mm HG in hypertensive participants. < 130/63mm HG in hypertensive participants with diabetes, heart failure or chronic kidney disease.;Long Term: Maintenance of blood pressure at goal levels.    Lipids Yes    Intervention Provide education and support for participant on nutrition & aerobic/resistive exercise along with prescribed medications to achieve LDL 70mg , HDL >40mg .    Expected Outcomes Short Term: Participant states understanding of desired cholesterol values and is compliant with medications prescribed. Participant is following exercise prescription and nutrition guidelines.;Long Term: Cholesterol controlled with medications as prescribed, with individualized exercise RX and with personalized nutrition plan. Value goals: LDL < 70mg , HDL > 40 mg.             Education:Diabetes - Individual verbal and written instruction to review signs/symptoms of diabetes, desired ranges of glucose level fasting, after meals and with exercise. Acknowledge that pre and post exercise glucose checks will be done for 3 sessions at entry of program.   Know Your Numbers and Heart Failure: - Group verbal and visual instruction to discuss disease risk factors for cardiac and pulmonary disease and treatment options.  Reviews associated critical values for Overweight/Obesity, Hypertension,  Cholesterol, and Diabetes.  Discusses basics of heart failure: signs/symptoms and treatments.  Introduces Heart Failure Zone chart for action plan for heart failure.  Written material given at graduation.   Core Components/Risk Factors/Patient Goals Review:    Core Components/Risk Factors/Patient Goals at Discharge (Final Review):    ITP Comments:  ITP Comments     Row Name 12/03/22 1037 12/05/22 1612 12/09/22 1242 12/19/22 1117 12/25/22 0759   ITP Comments Initial phone call completed. Diagnosis can be found in [facility] 4/11. EP Orientation scheduled for Thursday 4/25 at 2pm. Completed and gym orientation. Initial ITP created and sent for review to Dr. Vida Rigger, Medical Director. Per Dr. Karna Christmas, pause pulmonary rehab appointments- would like to order a holter monitor and be evaluated and cleared to go from a cardiac stand point until he returns. Pt was tachycardic at orient. Left message for patient and wife to call back to discuss appts. They saw Dr. Roberts Gaudy nurse on 4/26. Called and spoke with patient's wife- patient is due to see a cardiologist on 6/3 to follow up on holter monitor results. Patient had a holter orderd per Dr. Karna Christmas after he was notified that patient had elevated resting HR at pulmonary rehab orientation. Patient needs clearance from cardiologist and Dr. Karna Christmas before starting pulmonary rehab. Wife and patient aware they need written clearance from both MDs. Wife and patient will call us after cardiology appt to keep [place] updated. Placing patient on medical hold at this time before starting rehab. 30 Day review completed. Medical Director ITP review done, changes made as directed, and signed approval by Medical Director.   remains on medical hold    Row Name 01/16/23 1417 01/22/23 0725  ITP Comments Called pt to follow up from MD appt for clearance to start rehab.  Note show no findings and needing rehab.  Left message for patient. Pt has not yet started rehab.  30 Day review completed. Medical Director ITP review done, changes made as directed, and signed approval by Medical Director.   waiting to start after medical clearance               Comments:

## 2023-01-24 ENCOUNTER — Ambulatory Visit: Payer: Medicare HMO

## 2023-01-27 ENCOUNTER — Ambulatory Visit: Payer: Medicare HMO

## 2023-01-29 ENCOUNTER — Ambulatory Visit: Payer: Medicare HMO

## 2023-01-31 ENCOUNTER — Ambulatory Visit: Payer: Medicare HMO

## 2023-02-03 ENCOUNTER — Telehealth: Payer: Self-pay | Admitting: *Deleted

## 2023-02-03 ENCOUNTER — Encounter: Payer: Self-pay | Admitting: *Deleted

## 2023-02-03 ENCOUNTER — Ambulatory Visit: Payer: Medicare HMO

## 2023-02-03 NOTE — Telephone Encounter (Signed)
Called and spoke with pt. He has been cleared by cardiology to begin pulmonary rehab. Pt will start rehab 02/18/23 at 7:15 am.

## 2023-02-04 DIAGNOSIS — J449 Chronic obstructive pulmonary disease, unspecified: Secondary | ICD-10-CM | POA: Diagnosis not present

## 2023-02-04 DIAGNOSIS — C44712 Basal cell carcinoma of skin of right lower limb, including hip: Secondary | ICD-10-CM | POA: Diagnosis not present

## 2023-02-04 DIAGNOSIS — J432 Centrilobular emphysema: Secondary | ICD-10-CM | POA: Diagnosis not present

## 2023-02-05 ENCOUNTER — Ambulatory Visit: Payer: Medicare HMO

## 2023-02-07 ENCOUNTER — Ambulatory Visit: Payer: Medicare HMO

## 2023-02-10 ENCOUNTER — Ambulatory Visit: Payer: Medicare HMO

## 2023-02-11 DIAGNOSIS — J449 Chronic obstructive pulmonary disease, unspecified: Secondary | ICD-10-CM | POA: Diagnosis not present

## 2023-02-11 DIAGNOSIS — J9612 Chronic respiratory failure with hypercapnia: Secondary | ICD-10-CM | POA: Diagnosis not present

## 2023-02-11 DIAGNOSIS — J9621 Acute and chronic respiratory failure with hypoxia: Secondary | ICD-10-CM | POA: Diagnosis not present

## 2023-02-12 ENCOUNTER — Ambulatory Visit: Payer: Medicare HMO

## 2023-02-14 ENCOUNTER — Ambulatory Visit: Payer: Medicare HMO

## 2023-02-17 ENCOUNTER — Ambulatory Visit: Payer: Medicare HMO

## 2023-02-18 ENCOUNTER — Encounter: Payer: Medicare HMO | Attending: Pulmonary Disease | Admitting: *Deleted

## 2023-02-18 ENCOUNTER — Encounter: Payer: Self-pay | Admitting: *Deleted

## 2023-02-18 DIAGNOSIS — J449 Chronic obstructive pulmonary disease, unspecified: Secondary | ICD-10-CM

## 2023-02-18 NOTE — Progress Notes (Signed)
Pulmonary Individual Treatment Plan  Patient Details  Name: Gregory Haynes MRN: [id number] Date of Birth: [birth year] Referring Provider:    Initial Encounter Date:  Flowsheet Row Pulmonary Rehab from 12/05/2022 in [facility] Cardiac and Pulmonary Rehab  Date 12/05/22       Visit Diagnosis: Chronic obstructive pulmonary disease, unspecified COPD type (HCC)  Patient's Home Medications on Admission:  Current Outpatient Medications:    acidophilus (RISAQUAD) CAPS capsule, Take 1 capsule by mouth daily., Disp: , Rfl:    albuterol (VENTOLIN HFA) 108 (90 Base) MCG/ACT inhaler, Inhale 1-2 puffs into the lungs every 4 (four) hours as needed for wheezing or shortness of breath., Disp: 18 g, Rfl: 3   atorvastatin (LIPITOR) 40 MG tablet, Take 1 tablet (40 mg total) by mouth every evening., Disp: 90 tablet, Rfl: 3   carvedilol (COREG) 3.125 MG tablet, Take 1 tablet (3.125 mg total) by mouth 2 (two) times daily with a meal., Disp: 60 tablet, Rfl: 1   clobetasol cream (TEMOVATE) 0.05 %, Apply 1 Application topically 2 (two) times daily., Disp: , Rfl:    feeding supplement (ENSURE ENLIVE / ENSURE PLUS) LIQD, Take 237 mLs by mouth 3 (three) times daily. (Patient not taking: Reported on 12/03/2022), Disp: 237 mL, Rfl: 12   Fluticasone-Umeclidin-Vilant (TRELEGY ELLIPTA) 100-62.5-25 MCG/INH AEPB, Inhale 1 puff into the lungs daily., Disp: , Rfl:    folic acid (FOLVITE) 1 MG tablet, Take 1 mg by mouth daily. Unsure dose (Patient not taking: Reported on 12/03/2022), Disp: , Rfl:    ipratropium-albuterol (DUONEB) 0.5-2.5 (3) MG/3ML SOLN, Inhale into the lungs., Disp: , Rfl:    lisinopril (ZESTRIL) 2.5 MG tablet, Take 1 tablet (2.5 mg total) by mouth daily., Disp: 30 tablet, Rfl: 0   OXYGEN, Inhale 2 L/hr into the lungs. At night and as needed during day., Disp: , Rfl:   Past Medical History: Past Medical History:  Diagnosis Date   Anemia    Cancer (HCC)    BCC   Cataract    COPD (chronic obstructive pulmonary  disease) (HCC)    Helicobacter pylori ab+    Hyperlipidemia    Hypertension    Psoriasis    Wears hearing aid in both ears     Tobacco Use: Social History   Tobacco Use  Smoking Status Former   Packs/day: 1.50   Years: 55.00   Additional pack years: 0.00   Total pack years: 82.50   Types: Cigarettes   Quit date: 08/13/2011   Years since quitting: 11.5  Smokeless Tobacco Never    Labs: Review Flowsheet  More data exists      Latest Ref Rng & Units 05/17/2020 05/01/2022 10/31/2022 11/01/2022 11/02/2022  Labs for ITP Cardiac and Pulmonary Rehab  Cholestrol 100 - 199 mg/dL 409  811  - - -  LDL (calc) 0 - 99 mg/dL 79  70  - - -  HDL-C >91 mg/dL 48  49  - - -  Trlycerides 0 - 149 mg/dL 76  84  - - -  Hemoglobin A1c 4.8 - 5.6 % - - 6.3  - -  PH, Arterial 7.35 - 7.45 - - 7.38  7.36  7.43  7.47   PCO2 arterial 32 - 48 mmHg - - 71  80  69  62   Bicarbonate 20.0 - 28.0 mmol/L - - 48.4  42.0  45.2  45.6  45.8  45.1   O2 Saturation % - - 67  98.3  95.3  94.7  98.9  99.2      Pulmonary Assessment Scores:  Pulmonary Assessment Scores     Row Name 12/05/22 1617         ADL UCSD   ADL Phase Entry     SOB Score total 27     Rest 0     Walk 0     Stairs 2     Bath 0     Dress 0     Shop 1       CAT Score   CAT Score 6       mMRC Score   mMRC Score 1              UCSD: Self-administered rating of dyspnea associated with activities of daily living (ADLs) 6-point scale (0 = "not at all" to 5 = "maximal or unable to do because of breathlessness")  Scoring Scores range from 0 to 120.  Minimally important difference is 5 units  CAT: CAT can identify the health impairment of COPD patients and is better correlated with disease progression.  CAT has a scoring range of zero to 40. The CAT score is classified into four groups of low (less than 10), medium (10 - 20), high (21-30) and very high (31-40) based on the impact level of disease on health status. A CAT score over 10  suggests significant symptoms.  A worsening CAT score could be explained by an exacerbation, poor medication adherence, poor inhaler technique, or progression of COPD or comorbid conditions.  CAT MCID is 2 points  mMRC: mMRC (Modified Medical Research Council) Dyspnea Scale is used to assess the degree of baseline functional disability in patients of respiratory disease due to dyspnea. No minimal important difference is established. A decrease in score of 1 point or greater is considered a positive change.   Pulmonary Function Assessment:   Exercise Target Goals: Exercise Program Goal: Individual exercise prescription set using results from initial 6 min walk test and THRR while considering  patient's activity barriers and safety.   Exercise Prescription Goal: Initial exercise prescription builds to 30-45 minutes a day of aerobic activity, 2-3 days per week.  Home exercise guidelines will be given to patient during program as part of exercise prescription that the participant will acknowledge.  Education: Aerobic Exercise: - Group verbal and visual presentation on the components of exercise prescription. Introduces F.I.T.T principle from ACSM for exercise prescriptions.  Reviews F.I.T.T. principles of aerobic exercise including progression. Written material given at graduation.   Education: Resistance Exercise: - Group verbal and visual presentation on the components of exercise prescription. Introduces F.I.T.T principle from ACSM for exercise prescriptions  Reviews F.I.T.T. principles of resistance exercise including progression. Written material given at graduation.    Education: Exercise & Equipment Safety: - Individual verbal instruction and demonstration of equipment use and safety with use of the equipment. Flowsheet Row Pulmonary Rehab from 12/05/2022 in [facility] Cardiac and Pulmonary Rehab  Education need identified 12/05/22  Date 12/05/22  Educator KW  Instruction Review Code 1-  Verbalizes Understanding       Education: Exercise Physiology & General Exercise Guidelines: - Group verbal and written instruction with models to review the exercise physiology of the cardiovascular system and associated critical values. Provides general exercise guidelines with specific guidelines to those with heart or lung disease.    Education: Flexibility, Balance, Mind/Body Relaxation: - Group verbal and visual presentation with interactive activity on the components of exercise prescription. Introduces F.I.T.T principle from ACSM for exercise prescriptions. Reviews  F.I.T.T. principles of flexibility and balance exercise training including progression. Also discusses the mind body connection.  Reviews various relaxation techniques to help reduce and manage stress (i.e. Deep breathing, progressive muscle relaxation, and visualization). Balance handout provided to take home. Written material given at graduation.   Activity Barriers & Risk Stratification:  Activity Barriers & Cardiac Risk Stratification - 12/05/22 1630       Activity Barriers & Cardiac Risk Stratification   Activity Barriers Shortness of Breath;Balance Concerns;Deconditioning;Muscular Weakness;Assistive Device             6 Minute Walk:  6 Minute Walk     Row Name 12/05/22 1620         6 Minute Walk   Phase Initial     Distance 890 feet     Walk Time 6 minutes     # of Rest Breaks 0     MPH 1.68     METS 2.01     RPE 11     Perceived Dyspnea  1     VO2 Peak 7.03     Symptoms Yes (comment)     Comments SOB     Resting HR 114 bpm     Resting BP 112/56     Resting Oxygen Saturation  94 %     Exercise Oxygen Saturation  during 6 min walk 87 %     Max Ex. HR 125 bpm     Max Ex. BP 124/60     2 Minute Post BP 106/60       Interval HR   1 Minute HR 122     2 Minute HR 124     3 Minute HR 125     4 Minute HR 124     5 Minute HR 123     6 Minute HR 125     2 Minute Post HR 111     Interval  Heart Rate? Yes       Interval Oxygen   Interval Oxygen? Yes     Baseline Oxygen Saturation % 94 %     1 Minute Oxygen Saturation % 92 %     1 Minute Liters of Oxygen 2 L  continuous     2 Minute Oxygen Saturation % 90 %     2 Minute Liters of Oxygen 2 L     3 Minute Oxygen Saturation % 89 %     3 Minute Liters of Oxygen 2 L     4 Minute Oxygen Saturation % 88 %     4 Minute Liters of Oxygen 2 L     5 Minute Oxygen Saturation % 87 %     5 Minute Liters of Oxygen 2 L     6 Minute Oxygen Saturation % 87 %     6 Minute Liters of Oxygen 2 L     2 Minute Post Oxygen Saturation % 96 %     2 Minute Post Liters of Oxygen 2 L             Oxygen Initial Assessment:  Oxygen Initial Assessment - 12/05/22 1617       Home Oxygen   Home Oxygen Device Portable Concentrator;Home Concentrator    Sleep Oxygen Prescription Continuous;CPAP    Liters per minute 2    Home Exercise Oxygen Prescription Continuous    Liters per minute 2    Home Resting Oxygen Prescription Continuous    Liters per minute 2  Compliance with Home Oxygen Use Yes      Initial 6 min Walk   Oxygen Used Pulsed    Liters per minute 2      Program Oxygen Prescription   Program Oxygen Prescription Continuous    Liters per minute 2      Intervention   Short Term Goals To learn and exhibit compliance with exercise, home and travel O2 prescription;To learn and understand importance of monitoring SPO2 with pulse oximeter and demonstrate accurate use of the pulse oximeter.;To learn and understand importance of maintaining oxygen saturations>88%;To learn and demonstrate proper pursed lip breathing techniques or other breathing techniques. ;To learn and demonstrate proper use of respiratory medications    Long  Term Goals Exhibits compliance with exercise, home  and travel O2 prescription;Verbalizes importance of monitoring SPO2 with pulse oximeter and return demonstration;Maintenance of O2 saturations>88%;Exhibits proper  breathing techniques, such as pursed lip breathing or other method taught during program session;Compliance with respiratory medication;Demonstrates proper use of MDI's             Oxygen Re-Evaluation:  Oxygen Re-Evaluation     Row Name 02/18/23 0815             Program Oxygen Prescription   Program Oxygen Prescription Continuous       Liters per minute 2         Home Oxygen   Home Oxygen Device Portable Concentrator;Home Concentrator       Sleep Oxygen Prescription Continuous;CPAP       Liters per minute 2       Home Exercise Oxygen Prescription Continuous       Liters per minute 2       Home Resting Oxygen Prescription Continuous       Liters per minute 2       Compliance with Home Oxygen Use Yes         Goals/Expected Outcomes   Short Term Goals To learn and demonstrate proper pursed lip breathing techniques or other breathing techniques.        Long  Term Goals Exhibits proper breathing techniques, such as pursed lip breathing or other method taught during program session       Comments Reviewed PLB technique with pt.  Talked about how it works and it's importance in maintaining their exercise saturations.       Goals/Expected Outcomes Short: Become more profiecient at using PLB. Long: Become independent at using PLB.                Oxygen Discharge (Final Oxygen Re-Evaluation):  Oxygen Re-Evaluation - 02/18/23 0815       Program Oxygen Prescription   Program Oxygen Prescription Continuous    Liters per minute 2      Home Oxygen   Home Oxygen Device Portable Concentrator;Home Concentrator    Sleep Oxygen Prescription Continuous;CPAP    Liters per minute 2    Home Exercise Oxygen Prescription Continuous    Liters per minute 2    Home Resting Oxygen Prescription Continuous    Liters per minute 2    Compliance with Home Oxygen Use Yes      Goals/Expected Outcomes   Short Term Goals To learn and demonstrate proper pursed lip breathing techniques or other  breathing techniques.     Long  Term Goals Exhibits proper breathing techniques, such as pursed lip breathing or other method taught during program session    Comments Reviewed PLB technique with pt.  Talked about how  it works and it's importance in maintaining their exercise saturations.    Goals/Expected Outcomes Short: Become more profiecient at using PLB. Long: Become independent at using PLB.             Initial Exercise Prescription:  Initial Exercise Prescription - 12/05/22 1600       Date of Initial Exercise RX and Referring Provider   Date 12/05/22      Oxygen   Oxygen Continuous    Liters 2    Maintain Oxygen Saturation 88% or higher      NuStep   Level 1    SPM 80    Minutes 15    METs 2      REL-XR   Level 1    Speed 50    Minutes 15    METs 2      Biostep-RELP   Level 1    SPM 50    Minutes 15    METs 2      Track   Laps 20    Minutes 15    METs 2.09      Prescription Details   Frequency (times per week) 3    Duration Progress to 30 minutes of continuous aerobic without signs/symptoms of physical distress      Intensity   THRR 40-80% of Max Heartrate 122- 133    Ratings of Perceived Exertion 11-13    Perceived Dyspnea 0-4      Progression   Progression Continue to progress workloads to maintain intensity without signs/symptoms of physical distress.      Resistance Training   Training Prescription Yes    Weight 3 lb    Reps 10-15             Perform Capillary Blood Glucose checks as needed.  Exercise Prescription Changes:   Exercise Prescription Changes     Row Name 12/05/22 1600             Response to Exercise   Blood Pressure (Admit) 112/56       Blood Pressure (Exercise) 124/60       Blood Pressure (Exit) 106/60       Heart Rate (Admit) 114 bpm       Heart Rate (Exercise) 125 bpm       Heart Rate (Exit) 111 bpm       Oxygen Saturation (Admit) 94 %       Oxygen Saturation (Exercise) 87 %       Oxygen Saturation  (Exit) 96 %       Rating of Perceived Exertion (Exercise) 11       Perceived Dyspnea (Exercise) 1       Symptoms SOB       Comments walk test results                Exercise Comments:   Exercise Comments     Row Name 02/18/23 0814           Exercise Comments First full day of exercise!  Patient was oriented to gym and equipment including functions, settings, policies, and procedures.  Patient's individual exercise prescription and treatment plan were reviewed.  All starting workloads were established based on the results of the 6 minute walk test done at initial orientation visit.  The plan for exercise progression was also introduced and progression will be customized based on patient's performance and goals.  Exercise Goals and Review:   Exercise Goals     Row Name 12/05/22 1632             Exercise Goals   Increase Physical Activity Yes       Intervention Provide advice, education, support and counseling about physical activity/exercise needs.;Develop an individualized exercise prescription for aerobic and resistive training based on initial evaluation findings, risk stratification, comorbidities and participant's personal goals.       Expected Outcomes Short Term: Attend rehab on a regular basis to increase amount of physical activity.;Long Term: Add in home exercise to make exercise part of routine and to increase amount of physical activity.;Long Term: Exercising regularly at least 3-5 days a week.       Increase Strength and Stamina Yes       Intervention Provide advice, education, support and counseling about physical activity/exercise needs.;Develop an individualized exercise prescription for aerobic and resistive training based on initial evaluation findings, risk stratification, comorbidities and participant's personal goals.       Expected Outcomes Short Term: Increase workloads from initial exercise prescription for resistance, speed, and  METs.;Short Term: Perform resistance training exercises routinely during rehab and add in resistance training at home;Long Term: Improve cardiorespiratory fitness, muscular endurance and strength as measured by increased METs and functional capacity ( )       Able to understand and use rate of perceived exertion (RPE) scale Yes       Intervention Provide education and explanation on how to use RPE scale       Expected Outcomes Short Term: Able to use RPE daily in rehab to express subjective intensity level;Long Term:  Able to use RPE to guide intensity level when exercising independently       Able to understand and use Dyspnea scale Yes       Intervention Provide education and explanation on how to use Dyspnea scale       Expected Outcomes Short Term: Able to use Dyspnea scale daily in rehab to express subjective sense of shortness of breath during exertion;Long Term: Able to use Dyspnea scale to guide intensity level when exercising independently       Knowledge and understanding of Target Heart Rate Range (THRR) Yes       Intervention Provide education and explanation of THRR including how the numbers were predicted and where they are located for reference       Expected Outcomes Short Term: Able to state/look up THRR;Long Term: Able to use THRR to govern intensity when exercising independently;Short Term: Able to use daily as guideline for intensity in rehab       Able to check pulse independently Yes       Intervention Provide education and demonstration on how to check pulse in carotid and radial arteries.;Review the importance of being able to check your own pulse for safety during independent exercise       Expected Outcomes Long Term: Able to check pulse independently and accurately;Short Term: Able to explain why pulse checking is important during independent exercise       Understanding of Exercise Prescription Yes       Intervention Provide education, explanation, and written materials  on patient's individual exercise prescription       Expected Outcomes Short Term: Able to explain program exercise prescription;Long Term: Able to explain home exercise prescription to exercise independently                Exercise Goals Re-Evaluation :  Exercise Goals Re-Evaluation     Row Name 12/24/22 1453 02/18/23 0814           Exercise Goal Re-Evaluation   Exercise Goals Review Increase Physical Activity;Understanding of Exercise Prescription;Increase Strength and Stamina Able to understand and use rate of perceived exertion (RPE) scale;Able to understand and use Dyspnea scale;Understanding of Exercise Prescription      Comments Patient did not complete their first day of exercise yet. Patient had an elevated resting HR at his orientation and per Dr. Karna Christmas, had to get a holter ordered to rule out any abnormalities. Patient needs to follow up with the cardiologist and get clearance from them and Dr. Mervyn Skeeters before starting rehab back up. Reviewed RPE  and dyspnea scale, THR and program prescription with pt today.  Pt voiced understanding and was given a copy of goals to take home.      Expected Outcomes Short: Get clearance to rehab Long: Build up overall strength and stamina Short: Use RPE daily to regulate intensity. Long: Follow program prescription in THR.               Discharge Exercise Prescription (Final Exercise Prescription Changes):  Exercise Prescription Changes - 12/05/22 1600       Response to Exercise   Blood Pressure (Admit) 112/56    Blood Pressure (Exercise) 124/60    Blood Pressure (Exit) 106/60    Heart Rate (Admit) 114 bpm    Heart Rate (Exercise) 125 bpm    Heart Rate (Exit) 111 bpm    Oxygen Saturation (Admit) 94 %    Oxygen Saturation (Exercise) 87 %    Oxygen Saturation (Exit) 96 %    Rating of Perceived Exertion (Exercise) 11    Perceived Dyspnea (Exercise) 1    Symptoms SOB    Comments walk test results             Nutrition:  Target  Goals: Understanding of nutrition guidelines, daily intake of sodium 1500mg , cholesterol 200mg , calories 30% from fat and 7% or less from saturated fats, daily to have 5 or more servings of fruits and vegetables.  Education: All About Nutrition: -Group instruction provided by verbal, written material, interactive activities, discussions, models, and posters to present general guidelines for heart healthy nutrition including fat, fiber, MyPlate, the role of sodium in heart healthy nutrition, utilization of the nutrition label, and utilization of this knowledge for meal planning. Follow up email sent as well. Written material given at graduation. Flowsheet Row Pulmonary Rehab from 12/05/2022 in [facility] Cardiac and Pulmonary Rehab  Education need identified 12/05/22       Biometrics:  Pre Biometrics - 12/05/22 1630       Pre Biometrics   Height 5' 11.5" (1.816 m)    Weight 180 lb 8 oz (81.9 kg)    Waist Circumference --   declined   Hip Circumference --   declined   BMI (Calculated) 24.83    Single Leg Stand 5.5 seconds              Nutrition Therapy Plan and Nutrition Goals:  Nutrition Therapy & Goals - 12/05/22 1619       Intervention Plan   Intervention Prescribe, educate and counsel regarding individualized specific dietary modifications aiming towards targeted core components such as weight, hypertension, lipid management, diabetes, heart failure and other comorbidities.    Expected Outcomes Short Term Goal: Understand basic principles of dietary content, such as calories, fat, sodium, cholesterol and nutrients.;Short Term Goal: A plan  has been developed with personal nutrition goals set during dietitian appointment.;Long Term Goal: Adherence to prescribed nutrition plan.             Nutrition Assessments:  MEDIFICTS Score Key: ?70 Need to make dietary changes  40-70 Heart Healthy Diet ? 40 Therapeutic Level Cholesterol Diet  Flowsheet Row Pulmonary Rehab from  12/05/2022 in [facility] Cardiac and Pulmonary Rehab  Picture Your Plate Total Score on Admission 81      Picture Your Plate Scores: <16 Unhealthy dietary pattern with much room for improvement. 41-50 Dietary pattern unlikely to meet recommendations for good health and room for improvement. 51-60 More healthful dietary pattern, with some room for improvement.  >60 Healthy dietary pattern, although there may be some specific behaviors that could be improved.   Nutrition Goals Re-Evaluation:   Nutrition Goals Discharge (Final Nutrition Goals Re-Evaluation):   Psychosocial: Target Goals: Acknowledge presence or absence of significant depression and/or stress, maximize coping skills, provide positive support system. Participant is able to verbalize types and ability to use techniques and skills needed for reducing stress and depression.   Education: Stress, Anxiety, and Depression - Group verbal and visual presentation to define topics covered.  Reviews how body is impacted by stress, anxiety, and depression.  Also discusses healthy ways to reduce stress and to treat/manage anxiety and depression.  Written material given at graduation.   Education: Sleep Hygiene -Provides group verbal and written instruction about how sleep can affect your health.  Define sleep hygiene, discuss sleep cycles and impact of sleep habits. Review good sleep hygiene tips.    Initial Review & Psychosocial Screening:  Initial Psych Review & Screening - 12/03/22 1033       Initial Review   Current issues with None Identified      Family Dynamics   Good Support System? Yes   wife, family     Barriers   Psychosocial barriers to participate in program There are no identifiable barriers or psychosocial needs.      Screening Interventions   Interventions Provide feedback about the scores to participant;Encouraged to exercise;To provide support and resources with identified psychosocial needs    Expected Outcomes  Short Term goal: Utilizing psychosocial counselor, staff and physician to assist with identification of specific Stressors or current issues interfering with healing process. Setting desired goal for each stressor or current issue identified.;Long Term Goal: Stressors or current issues are controlled or eliminated.;Short Term goal: Identification and review with participant of any Quality of Life or Depression concerns found by scoring the questionnaire.;Long Term goal: The participant improves quality of Life and PHQ9 Scores as seen by post scores and/or verbalization of changes             Quality of Life Scores:  Scores of 19 and below usually indicate a poorer quality of life in these areas.  A difference of  2-3 points is a clinically meaningful difference.  A difference of 2-3 points in the total score of the Quality of Life Index has been associated with significant improvement in overall quality of life, self-image, physical symptoms, and general health in studies assessing change in quality of life.  PHQ-9: Review Flowsheet  More data exists      12/05/2022 07/01/2022 05/01/2022 07/01/2019 06/24/2018  Depression screen PHQ 2/9  Decreased Interest 0 0 0 0 0  Down, Depressed, Hopeless 0 0 0 0 1  PHQ - 2 Score 0 0 0 0 1  Altered sleeping 0 0 0 - -  Tired, decreased energy 1 0 0 - -  Change in appetite 0 0 0 - -  Feeling bad or failure about yourself  0 0 0 - -  Trouble concentrating 0 0 0 - -  Moving slowly or fidgety/restless 0 0 0 - -  Suicidal thoughts 0 0 0 - -  PHQ-9 Score 1 0 0 - -  Difficult doing work/chores Not difficult at all Not difficult at all Not difficult at all - -   Interpretation of Total Score  Total Score Depression Severity:  1-4 = Minimal depression, 5-9 = Mild depression, 10-14 = Moderate depression, 15-19 = Moderately severe depression, 20-27 = Severe depression   Psychosocial Evaluation and Intervention:  Psychosocial Evaluation - 12/03/22 1033        Psychosocial Evaluation & Interventions   Comments 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 He has been on oxygen for 12 years and feels comfortable managing it. After his most recent hospitalization, PT came to his house to show him some things. However, he and his wife admit that it wasn't too much to go off of and mainly he doesn't feel motivated to do the exercises alone. So they are both ready for him to get started in the program. He likes people and being in a group setting. He and his wife manage his health care needs and he states he feels like he is in a good spot mentally. He and his wife joked that the biggest stressor in his life right now is that she won't let him have the dessert he wants every night. He is motivated to attend the program    Expected Outcomes Short: attend pulmonary rehab for education and exercise. Long: develop and maintain positive self care habits.    Continue Psychosocial Services  Follow up required by staff             Psychosocial Re-Evaluation:   Psychosocial Discharge (Final Psychosocial Re-Evaluation):   Education: Education Goals: Education classes will be provided on a weekly basis, covering required topics. Participant will state understanding/return demonstration of topics presented.  Learning Barriers/Preferences:  Learning Barriers/Preferences - 12/03/22 1014       Learning Barriers/Preferences   Learning Barriers Hearing    Learning Preferences Individual Instruction             General Pulmonary Education Topics:  Infection Prevention: - Provides verbal and written material to individual with discussion of infection control including proper hand washing and proper equipment cleaning during exercise session. Flowsheet Row Pulmonary Rehab from 12/05/2022 in [facility] Card[facility] Pul[facility]  Education need identified 12/05/22  Date 12/05/22  Educator KW  Instruction Review Code 1- Verbalizes Understanding        Falls Prevention: - Provides verbal and written material to individual with discussion of falls prevention and safety. Flowsheet Row Pulmonary Rehab from 12/05/2022 in [facility] Pul[facility]  Education need identified 12/05/22  Date 12/05/22  Educator KW  Instruction Review Code 1- Verbalizes Understanding       Chronic Lung Disease Review: - Group verbal instruction with posters, models, PowerPoint presentations and videos,  to review new updates, new respiratory medications, new advancements in procedures and treatments. Providing information on websites and "800" numbers for continued self-education. Includes information about supplement oxygen, available portable oxygen systems, continuous and intermittent flow rates, oxygen safety, concentrators, and Medicare reimbursement for oxygen. Explanation of Pulmonary Drugs, including class, frequency, complications, importance of spacers, rinsing mouth after steroid MDI's,  and proper cleaning methods for nebulizers. Review of basic lung anatomy and physiology related to function, structure, and complications of lung disease. Review of risk factors. Discussion about methods for diagnosing sleep apnea and types of masks and machines for OSA. Includes a review of the use of types of environmental controls: home humidity, furnaces, filters, dust mite/pet prevention, HEPA vacuums. Discussion about weather changes, air quality and the benefits of nasal washing. Instruction on Warning signs, infection symptoms, calling MD promptly, preventive modes, and value of vaccinations. Review of effective airway clearance, coughing and/or vibration techniques. Emphasizing that all should Create an Action Plan. Written material given at graduation. Flowsheet Row Pulmonary Rehab from 12/05/2022 in [facility] Cardiac and Pulmonary Rehab  Education need identified 12/05/22       AED/CPR: - Group verbal and written instruction with the use of models to  demonstrate the basic use of the AED with the basic ABC's of resuscitation.    Anatomy and Cardiac Procedures: - Group verbal and visual presentation and models provide information about basic cardiac anatomy and function. Reviews the testing methods done to diagnose heart disease and the outcomes of the test results. Describes the treatment choices: Medical Management, Angioplasty, or Coronary Bypass Surgery for treating various heart conditions including Myocardial Infarction, Angina, Valve Disease, and Cardiac Arrhythmias.  Written material given at graduation.   Medication Safety: - Group verbal and visual instruction to review commonly prescribed medications for heart and lung disease. Reviews the medication, class of the drug, and side effects. Includes the steps to properly store meds and maintain the prescription regimen.  Written material given at graduation.   Other: -Provides group and verbal instruction on various topics (see comments)   Knowledge Questionnaire Score:  Knowledge Questionnaire Score - 12/05/22 1617       Knowledge Questionnaire Score   Pre Score 16/18              Core Components/Risk Factors/Patient Goals at Admission:  Personal Goals and Risk Factors at Admission - 12/05/22 1633       Core Components/Risk Factors/Patient Goals on Admission    Weight Management Yes;Weight Maintenance   Already lost 14 lbs since hospital visit   Intervention Weight Management: Develop a combined nutrition and exercise program designed to reach desired caloric intake, while maintaining appropriate intake of nutrient and fiber, sodium and fats, and appropriate energy expenditure required for the weight goal.;Weight Management: Provide education and appropriate resources to help participant work on and attain dietary goals.;Weight Management/Obesity: Establish reasonable short term and long term weight goals.    Admit Weight 180 lb (81.6 kg)    Goal Weight: Short Term 180  lb (81.6 kg)    Goal Weight: Long Term 180 lb (81.6 kg)    Expected Outcomes Short Term: Continue to assess and modify interventions until short term weight is achieved;Long Term: Adherence to nutrition and physical activity/exercise program aimed toward attainment of established weight goal;Weight Maintenance: Understanding of the daily nutrition guidelines, which includes 25-35% calories from fat, 7% or less cal from saturated fats, less than 200mg  cholesterol, less than 1.5gm of sodium, & 5 or more servings of fruits and vegetables daily;Understanding recommendations for meals to include 15-35% energy as protein, 25-35% energy from fat, 35-60% energy from carbohydrates, less than 200mg  of dietary cholesterol, 20-35 gm of total fiber daily;Understanding of distribution of calorie intake throughout the day with the consumption of 4-5 meals/snacks    Improve shortness of breath with ADL's Yes    Intervention  Provide education, individualized exercise plan and daily activity instruction to help decrease symptoms of SOB with activities of daily living.    Expected Outcomes Short Term: Improve cardiorespiratory fitness to achieve a reduction of symptoms when performing ADLs;Long Term: Be able to perform more ADLs without symptoms or delay the onset of symptoms    Hypertension Yes    Intervention Provide education on lifestyle modifcations including regular physical activity/exercise, weight management, moderate sodium restriction and increased consumption of fresh fruit, vegetables, and low fat dairy, alcohol moderation, and smoking cessation.;Monitor prescription use compliance.    Expected Outcomes Short Term: Continued assessment and intervention until BP is < 140/56mm HG in hypertensive participants. < 130/22mm HG in hypertensive participants with diabetes, heart failure or chronic kidney disease.;Long Term: Maintenance of blood pressure at goal levels.    Lipids Yes    Intervention Provide education and  support for participant on nutrition & aerobic/resistive exercise along with prescribed medications to achieve LDL 70mg , HDL >40mg .    Expected Outcomes Short Term: Participant states understanding of desired cholesterol values and is compliant with medications prescribed. Participant is following exercise prescription and nutrition guidelines.;Long Term: Cholesterol controlled with medications as prescribed, with individualized exercise RX and with personalized nutrition plan. Value goals: LDL < 70mg , HDL > 40 mg.             Education:Diabetes - Individual verbal and written instruction to review signs/symptoms of diabetes, desired ranges of glucose level fasting, after meals and with exercise. Acknowledge that pre and post exercise glucose checks will be done for 3 sessions at entry of program.   Know Your Numbers and Heart Failure: - Group verbal and visual instruction to discuss disease risk factors for cardiac and pulmonary disease and treatment options.  Reviews associated critical values for Overweight/Obesity, Hypertension, Cholesterol, and Diabetes.  Discusses basics of heart failure: signs/symptoms and treatments.  Introduces Heart Failure Zone chart for action plan for heart failure.  Written material given at graduation.   Core Components/Risk Factors/Patient Goals Review:    Core Components/Risk Factors/Patient Goals at Discharge (Final Review):    ITP Comments:  ITP Comments     Row Name 12/03/22 1037 12/05/22 1612 12/09/22 1242 12/19/22 1117 12/25/22 0759   ITP Comments Initial phone call completed. Diagnosis can be found in [facility] 4/11. EP Orientation scheduled for Thursday 4/25 at 2pm. Completed and gym orientation. Initial ITP created and sent for review to Dr. Vida Rigger, Medical Director. Per Dr. Karna Christmas, pause pulmonary rehab appointments- would like to order a holter monitor and be evaluated and cleared to go from a cardiac stand point until he returns. Pt  was tachycardic at orient. Left message for patient and wife to call back to discuss appts. They saw Dr. Roberts Gaudy nurse on 4/26. Called and spoke with patient's wife- patient is due to see a cardiologist on 6/3 to follow up on holter monitor results. Patient had a holter orderd per Dr. Karna Christmas after he was notified that patient had elevated resting HR at pulmonary rehab orientation. Patient needs clearance from cardiologist and Dr. Karna Christmas before starting pulmonary rehab. Wife and patient aware they need written clearance from both MDs. Wife and patient will call us after cardiology appt to keep [place] updated. Placing patient on medical hold at this time before starting rehab. 30 Day review completed. Medical Director ITP review done, changes made as directed, and signed approval by Medical Director.   remains on medical hold    Row Name 01/16/23 [phone]  01/22/23 0725 02/03/23 1618 02/18/23 0814 02/18/23 1356   ITP Comments Called pt to follow up from MD appt for clearance to start rehab.  Note show no findings and needing rehab.  Left message for patient. Pt has not yet started rehab. 30 Day review completed. Medical Director ITP review done, changes made as directed, and signed approval by Medical Director.   waiting to start after medical clearance Called and spoke with pt. He has been cleared by cardiology to begin pulmonary rehab. Pt will start rehab 02/18/23 at 7:15 am. First full day of exercise!  Patient was oriented to gym and equipment including functions, settings, policies, and procedures.  Patient's individual exercise prescription and treatment plan were reviewed.  All starting workloads were established based on the results of the 6 minute walk test done at initial orientation visit.  The plan for exercise progression was also introduced and progression will be customized based on patient's performance and goals. 30 Day review completed. Medical Director ITP review done, changes made as directed, and signed  approval by Medical Director.   new to program            Comments:

## 2023-02-18 NOTE — Progress Notes (Signed)
Daily Session Note  Patient Details  Name: Gregory Haynes MRN: [id number] Date of Birth: 09-19-1940 Referring Provider:    Encounter Date: 02/18/2023  Check In:  Session Check In - 02/18/23 0813       Check-In   Supervising physician immediately available to respond to emergencies See telemetry face sheet for immediately available ER MD    Location [facility]-Cardiac & Pulmonary Rehab    Staff Present Cora Collum, RN, BSN, Matthew Saras, BS, ACSM CEP, Exercise Physiologist    Virtual Visit No    Medication changes reported     No    Fall or balance concerns reported    No    Warm-up and Cool-down Performed on first and last piece of equipment    Resistance Training Performed Yes    VAD Patient? No    PAD/SET Patient? No      Pain Assessment   Currently in Pain? No/denies                Social History   Tobacco Use  Smoking Status Former   Packs/day: 1.50   Years: 55.00   Additional pack years: 0.00   Total pack years: 82.50   Types: Cigarettes   Quit date: 08/13/2011   Years since quitting: 11.5  Smokeless Tobacco Never    Goals Met:  Proper associated with RPD/PD & O2 Sat Exercise tolerated well Personal goals reviewed No report of concerns or symptoms today  Goals Unmet:  Not Applicable  Comments: First full day of exercise!  Patient was oriented to gym and equipment including functions, settings, policies, and procedures.  Patient's individual exercise prescription and treatment plan were reviewed.  All starting workloads were established based on the results of the 6 minute walk test done at initial orientation visit.  The plan for exercise progression was also introduced and progression will be customized based on patient's performance and goals.    Dr. Bethann Punches is Medical Director for [facility] Cardiac Rehabilitation.  Dr. Vida Rigger is Medical Director for [facility] Pulmonary Rehabilitation.

## 2023-02-18 NOTE — Patient Instructions (Signed)
Patient Instructions  Patient Details  Name: Gregory Haynes MRN: [id number] Date of Birth: February 09, 1941 Referring Provider:  Vida Rigger, MD  Below are your personal goals for exercise, nutrition, and risk factors. Our goal is to help you stay on track towards obtaining and maintaining these goals. We will be discussing your progress on these goals with you throughout the program.  Initial Exercise Prescription:  Initial Exercise Prescription - 12/05/22 1600       Date of Initial Exercise RX and Referring Provider   Date 12/05/22      Oxygen   Oxygen Continuous    Liters 2    Maintain Oxygen Saturation 88% or higher      NuStep   Level 1    SPM 80    Minutes 15    METs 2      REL-XR   Level 1    Speed 50    Minutes 15    METs 2      Biostep-RELP   Level 1    SPM 50    Minutes 15    METs 2      Track   Laps 20    Minutes 15    METs 2.09      Prescription Details   Frequency (times per week) 3    Duration Progress to 30 minutes of continuous aerobic without signs/symptoms of physical distress      Intensity   THRR 40-80% of Max Heartrate 122- 133    Ratings of Perceived Exertion 11-13    Perceived Dyspnea 0-4      Progression   Progression Continue to progress workloads to maintain intensity without signs/symptoms of physical distress.      Resistance Training   Training Prescription Yes    Weight 3 lb    Reps 10-15             Exercise Goals: Frequency: Be able to perform aerobic exercise two to three times per week in program working toward 2-5 days per week of home exercise.  Intensity: Work with a perceived exertion of 11 (fairly light) - 15 (hard) while following your exercise prescription.  We will make changes to your prescription with you as you progress through the program.   Duration: Be able to do 30 to 45 minutes of continuous aerobic exercise in addition to a 5 minute warm-up and a 5 minute cool-down routine.   Nutrition  Goals: Your personal nutrition goals will be established when you do your nutrition analysis with the dietician.  The following are general nutrition guidelines to follow: Cholesterol < 200mg /day Sodium < 1500mg /day Fiber: Men over 50 yrs - 30 grams per day  Personal Goals:  Personal Goals and Risk Factors at Admission - 12/05/22 1633       Core Components/Risk Factors/Patient Goals on Admission    Weight Management Yes;Weight Maintenance   Already lost 14 lbs since hospital visit   Intervention Weight Management: Develop a combined nutrition and exercise program designed to reach desired caloric intake, while maintaining appropriate intake of nutrient and fiber, sodium and fats, and appropriate energy expenditure required for the weight goal.;Weight Management: Provide education and appropriate resources to help participant work on and attain dietary goals.;Weight Management/Obesity: Establish reasonable short term and long term weight goals.    Admit Weight 180 lb (81.6 kg)    Goal Weight: Short Term 180 lb (81.6 kg)    Goal Weight: Long Term 180 lb (81.6 kg)  Expected Outcomes Short Term: Continue to assess and modify interventions until short term weight is achieved;Long Term: Adherence to nutrition and physical activity/exercise program aimed toward attainment of established weight goal;Weight Maintenance: Understanding of the daily nutrition guidelines, which includes 25-35% calories from fat, 7% or less cal from saturated fats, less than 200mg  cholesterol, less than 1.5gm of sodium, & 5 or more servings of fruits and vegetables daily;Understanding recommendations for meals to include 15-35% energy as protein, 25-35% energy from fat, 35-60% energy from carbohydrates, less than 200mg  of dietary cholesterol, 20-35 gm of total fiber daily;Understanding of distribution of calorie intake throughout the day with the consumption of 4-5 meals/snacks    Improve shortness of breath with ADL's Yes     Intervention Provide education, individualized exercise plan and daily activity instruction to help decrease symptoms of SOB with activities of daily living.    Expected Outcomes Short Term: Improve cardiorespiratory fitness to achieve a reduction of symptoms when performing ADLs;Long Term: Be able to perform more ADLs without symptoms or delay the onset of symptoms    Hypertension Yes    Intervention Provide education on lifestyle modifcations including regular physical activity/exercise, weight management, moderate sodium restriction and increased consumption of fresh fruit, vegetables, and low fat dairy, alcohol moderation, and smoking cessation.;Monitor prescription use compliance.    Expected Outcomes Short Term: Continued assessment and intervention until BP is < 140/78mm HG in hypertensive participants. < 130/9mm HG in hypertensive participants with diabetes, heart failure or chronic kidney disease.;Long Term: Maintenance of blood pressure at goal levels.    Lipids Yes    Intervention Provide education and support for participant on nutrition & aerobic/resistive exercise along with prescribed medications to achieve LDL 70mg , HDL >40mg .    Expected Outcomes Short Term: Participant states understanding of desired cholesterol values and is compliant with medications prescribed. Participant is following exercise prescription and nutrition guidelines.;Long Term: Cholesterol controlled with medications as prescribed, with individualized exercise RX and with personalized nutrition plan. Value goals: LDL < 70mg , HDL > 40 mg.             Tobacco Use Initial Evaluation: Social History   Tobacco Use  Smoking Status Former   Packs/day: 1.50   Years: 55.00   Additional pack years: 0.00   Total pack years: 82.50   Types: Cigarettes   Quit date: 08/13/2011   Years since quitting: 11.5  Smokeless Tobacco Never    Exercise Goals and Review:  Exercise Goals     Row Name 12/05/22 1632              Exercise Goals   Increase Physical Activity Yes       Intervention Provide advice, education, support and counseling about physical activity/exercise needs.;Develop an individualized exercise prescription for aerobic and resistive training based on initial evaluation findings, risk stratification, comorbidities and participant's personal goals.       Expected Outcomes Short Term: Attend rehab on a regular basis to increase amount of physical activity.;Long Term: Add in home exercise to make exercise part of routine and to increase amount of physical activity.;Long Term: Exercising regularly at least 3-5 days a week.       Increase Strength and Stamina Yes       Intervention Provide advice, education, support and counseling about physical activity/exercise needs.;Develop an individualized exercise prescription for aerobic and resistive training based on initial evaluation findings, risk stratification, comorbidities and participant's personal goals.       Expected Outcomes Short Term: Increase  workloads from initial exercise prescription for resistance, speed, and METs.;Short Term: Perform resistance training exercises routinely during rehab and add in resistance training at home;Long Term: Improve cardiorespiratory fitness, muscular endurance and strength as measured by increased METs and functional capacity ( )       Able to understand and use rate of perceived exertion (RPE) scale Yes       Intervention Provide education and explanation on how to use RPE scale       Expected Outcomes Short Term: Able to use RPE daily in rehab to express subjective intensity level;Long Term:  Able to use RPE to guide intensity level when exercising independently       Able to understand and use Dyspnea scale Yes       Intervention Provide education and explanation on how to use Dyspnea scale       Expected Outcomes Short Term: Able to use Dyspnea scale daily in rehab to express subjective sense of  shortness of breath during exertion;Long Term: Able to use Dyspnea scale to guide intensity level when exercising independently       Knowledge and understanding of Target Heart Rate Range (THRR) Yes       Intervention Provide education and explanation of THRR including how the numbers were predicted and where they are located for reference       Expected Outcomes Short Term: Able to state/look up THRR;Long Term: Able to use THRR to govern intensity when exercising independently;Short Term: Able to use daily as guideline for intensity in rehab       Able to check pulse independently Yes       Intervention Provide education and demonstration on how to check pulse in carotid and radial arteries.;Review the importance of being able to check your own pulse for safety during independent exercise       Expected Outcomes Long Term: Able to check pulse independently and accurately;Short Term: Able to explain why pulse checking is important during independent exercise       Understanding of Exercise Prescription Yes       Intervention Provide education, explanation, and written materials on patient's individual exercise prescription       Expected Outcomes Short Term: Able to explain program exercise prescription;Long Term: Able to explain home exercise prescription to exercise independently                Copy of goals given to participant.

## 2023-02-19 ENCOUNTER — Ambulatory Visit: Payer: Medicare HMO

## 2023-02-19 DIAGNOSIS — J449 Chronic obstructive pulmonary disease, unspecified: Secondary | ICD-10-CM | POA: Diagnosis not present

## 2023-02-20 ENCOUNTER — Encounter: Payer: Medicare HMO | Admitting: *Deleted

## 2023-02-20 DIAGNOSIS — J449 Chronic obstructive pulmonary disease, unspecified: Secondary | ICD-10-CM | POA: Diagnosis not present

## 2023-02-20 NOTE — Progress Notes (Signed)
Daily Session Note  Patient Details  Name: Gregory Haynes MRN: [id number] Date of Birth: 1941-03-30 Referring Provider:    Encounter Date: 02/20/2023  Check In:  Session Check In - 02/20/23 0728       Check-In   Supervising physician immediately available to respond to emergencies See telemetry face sheet for immediately available ER MD    Location [facility]    Staff Present Lanny Hurst, RN, ADN;Joseph Hood, RCP,RRT,BSRT;Other   Max Manya Silvas, BS   Virtual Visit No    Medication changes reported     No    Fall or balance concerns reported    No    Warm-up and Cool-down Performed on first and last piece of equipment    Resistance Training Performed Yes    VAD Patient? No    PAD/SET Patient? No      Pain Assessment   Currently in Pain? No/denies                Social History   Tobacco Use  Smoking Status Former   Current packs/day: 0.00   Average packs/day: 1.5 packs/day for 55.0 years (82.5 ttl pk-yrs)   Types: Cigarettes   Start date: 08/12/1956   Quit date: 08/13/2011   Years since quitting: 11.5  Smokeless Tobacco Never    Goals Met:  Independence with exercise equipment Exercise tolerated well No report of concerns or symptoms today Strength training completed today  Goals Unmet:  Not Applicable  Comments: Pt able to follow exercise prescription today without complaint.  Will continue to monitor for progression.    Dr. Bethann Punches is Medical Director for [facility] Cardiac Rehabilitation.  Dr. Vida Rigger is Medical Director for [facility] Pulmonary Rehabilitation.

## 2023-02-21 ENCOUNTER — Ambulatory Visit: Payer: Medicare HMO

## 2023-02-24 ENCOUNTER — Ambulatory Visit: Payer: Medicare HMO

## 2023-02-25 ENCOUNTER — Encounter: Payer: Medicare HMO | Admitting: *Deleted

## 2023-02-25 DIAGNOSIS — J449 Chronic obstructive pulmonary disease, unspecified: Secondary | ICD-10-CM

## 2023-02-25 NOTE — Progress Notes (Signed)
Daily Session Note  Patient Details  Name: Gregory Haynes MRN: [id number] Date of Birth: June 10, 1941 Referring Provider:    Encounter Date: 02/25/2023  Check In:  Session Check In - 02/25/23 0827       Check-In   Supervising physician immediately available to respond to emergencies See telemetry face sheet for immediately available ER MD    Location [facility]    Staff Present Cora Collum, RN, BSN, Matthew Saras, BS, ACSM CEP, Exercise Physiologist;Noah Tickle, BS, Exercise Physiologist    Virtual Visit No    Medication changes reported     No    Fall or balance concerns reported    No    Warm-up and Cool-down Performed on first and last piece of equipment    Resistance Training Performed Yes    VAD Patient? No    PAD/SET Patient? No      Pain Assessment   Currently in Pain? No/denies                Social History   Tobacco Use  Smoking Status Former   Current packs/day: 0.00   Average packs/day: 1.5 packs/day for 55.0 years (82.5 ttl pk-yrs)   Types: Cigarettes   Start date: 08/12/1956   Quit date: 08/13/2011   Years since quitting: 11.5  Smokeless Tobacco Never    Goals Met:  Proper associated with RPD/PD & O2 Sat Independence with exercise equipment Exercise tolerated well No report of concerns or symptoms today  Goals Unmet:  Not Applicable  Comments: Pt able to follow exercise prescription today without complaint.  Will continue to monitor for progression.    Dr. Bethann Punches is Medical Director for [facility] Cardiac Rehabilitation.  Dr. Vida Rigger is Medical Director for [facility] Pulmonary Rehabilitation.

## 2023-02-26 ENCOUNTER — Ambulatory Visit: Payer: Medicare HMO

## 2023-02-27 ENCOUNTER — Encounter: Payer: Medicare HMO | Admitting: *Deleted

## 2023-02-27 DIAGNOSIS — J449 Chronic obstructive pulmonary disease, unspecified: Secondary | ICD-10-CM

## 2023-02-27 NOTE — Progress Notes (Signed)
Daily Session Note  Patient Details  Name: Gregory Haynes MRN: [id number] Date of Birth: 12-04-1940 Referring Provider:    Encounter Date: 02/27/2023  Check In:  Session Check In - 02/27/23 0725       Check-In   Supervising physician immediately available to respond to emergencies See telemetry face sheet for immediately available ER MD    Location [facility]    Staff Present Lanny Hurst, RN, ADN;Joseph [place], [place];Betsy Coder, PhD, RN, CNS, CEN    Virtual Visit No    Medication changes reported     No    Fall or balance concerns reported    No    Warm-up and Cool-down Performed on first and last piece of equipment    Resistance Training Performed Yes    VAD Patient? No    PAD/SET Patient? No      Pain Assessment   Currently in Pain? No/denies                Social History   Tobacco Use  Smoking Status Former   Current packs/day: 0.00   Average packs/day: 1.5 packs/day for 55.0 years (82.5 ttl pk-yrs)   Types: Cigarettes   Start date: 08/12/1956   Quit date: 08/13/2011   Years since quitting: 11.5  Smokeless Tobacco Never    Goals Met:  Independence with exercise equipment Exercise tolerated well No report of concerns or symptoms today Strength training completed today  Goals Unmet:  Not Applicable  Comments: Pt able to follow exercise prescription today without complaint.  Will continue to monitor for progression.    Dr. Bethann Punches is Medical Director for [facility] Cardiac Rehabilitation.  Dr. Vida Rigger is Medical Director for [facility] Pulmonary Rehabilitation.

## 2023-02-28 ENCOUNTER — Ambulatory Visit: Payer: Medicare HMO

## 2023-03-03 ENCOUNTER — Ambulatory Visit: Payer: Medicare HMO

## 2023-03-04 ENCOUNTER — Encounter: Payer: Medicare HMO | Admitting: *Deleted

## 2023-03-04 DIAGNOSIS — J449 Chronic obstructive pulmonary disease, unspecified: Secondary | ICD-10-CM

## 2023-03-04 NOTE — Progress Notes (Signed)
Daily Session Note  Patient Details  Name: Gregory Haynes MRN: [id number] Date of Birth: 10/10/1940 Referring Provider:    Encounter Date: 03/04/2023  Check In:  Session Check In - 03/04/23 0814       Check-In   Supervising physician immediately available to respond to emergencies See telemetry face sheet for immediately available ER MD    Location [facility]    Staff Present Cora Collum, RN, BSN, CCRP;Other   Rory Percy MS, Exercise Physiologist ; Maxon Conetta BS, Exercise Physiologist   Virtual Visit No    Medication changes reported     No    Fall or balance concerns reported    No    Warm-up and Cool-down Performed on first and last piece of equipment    Resistance Training Performed Yes    VAD Patient? No    PAD/SET Patient? No      Pain Assessment   Currently in Pain? No/denies                Social History   Tobacco Use  Smoking Status Former   Current packs/day: 0.00   Average packs/day: 1.5 packs/day for 55.0 years (82.5 ttl pk-yrs)   Types: Cigarettes   Start date: 08/12/1956   Quit date: 08/13/2011   Years since quitting: 11.5  Smokeless Tobacco Never    Goals Met:  Proper associated with RPD/PD & O2 Sat Independence with exercise equipment Exercise tolerated well No report of concerns or symptoms today  Goals Unmet:  Not Applicable  Comments: Pt able to follow exercise prescription today without complaint.  Will continue to monitor for progression.    Dr. Bethann Punches is Medical Director for [facility] Cardiac Rehabilitation.  Dr. Vida Rigger is Medical Director for [facility] Pulmonary Rehabilitation.

## 2023-03-05 ENCOUNTER — Ambulatory Visit: Payer: Medicare HMO

## 2023-03-06 ENCOUNTER — Encounter: Payer: Medicare HMO | Admitting: *Deleted

## 2023-03-06 DIAGNOSIS — J449 Chronic obstructive pulmonary disease, unspecified: Secondary | ICD-10-CM | POA: Diagnosis not present

## 2023-03-06 DIAGNOSIS — J432 Centrilobular emphysema: Secondary | ICD-10-CM | POA: Diagnosis not present

## 2023-03-06 NOTE — Progress Notes (Signed)
Daily Session Note  Patient Details  Name: EARNEST MCGILLIS MRN: [id number] Date of Birth: 17-Aug-1940 Referring Provider:    Encounter Date: 03/06/2023  Check In:  Session Check In - 03/06/23 0754       Check-In   Supervising physician immediately available to respond to emergencies See telemetry face sheet for immediately available ER MD    Location [facility]    Staff Present Lanny Hurst, RN, ADN;Joseph Hood, RCP,RRT,BSRT;Other   Max Manya Silvas, BS   Virtual Visit No    Medication changes reported     No    Fall or balance concerns reported    No    Warm-up and Cool-down Performed on first and last piece of equipment    Resistance Training Performed Yes    VAD Patient? No    PAD/SET Patient? No      Pain Assessment   Currently in Pain? No/denies                Social History   Tobacco Use  Smoking Status Former   Current packs/day: 0.00   Average packs/day: 1.5 packs/day for 55.0 years (82.5 ttl pk-yrs)   Types: Cigarettes   Start date: 08/12/1956   Quit date: 08/13/2011   Years since quitting: 11.5  Smokeless Tobacco Never    Goals Met:  Independence with exercise equipment Exercise tolerated well No report of concerns or symptoms today Strength training completed today  Goals Unmet:  Not Applicable  Comments: Pt able to follow exercise prescription today without complaint.  Will continue to monitor for progression.    Dr. Bethann Punches is Medical Director for [facility] Cardiac Rehabilitation.  Dr. Vida Rigger is Medical Director for [facility] Pulmonary Rehabilitation.

## 2023-03-11 ENCOUNTER — Encounter: Payer: Medicare HMO | Admitting: *Deleted

## 2023-03-11 DIAGNOSIS — J449 Chronic obstructive pulmonary disease, unspecified: Secondary | ICD-10-CM | POA: Diagnosis not present

## 2023-03-11 NOTE — Progress Notes (Signed)
Daily Session Note  Patient Details  Name: Gregory Haynes MRN: [id number] Date of Birth: May 17, 1941 Referring Provider:    Encounter Date: 03/11/2023  Check In:  Session Check In - 03/11/23 0807       Check-In   Supervising physician immediately available to respond to emergencies See telemetry face sheet for immediately available ER MD    Location [facility]    Staff Present Cora Collum, RN, BSN, CCRP;Other   Maxon Conetta BS Exercise Physiologist, Rory Percy MS Exercise Physiologist   Virtual Visit No    Medication changes reported     No    Fall or balance concerns reported    No    Warm-up and Cool-down Performed on first and last piece of equipment    Resistance Training Performed Yes    VAD Patient? No    PAD/SET Patient? No      Pain Assessment   Currently in Pain? No/denies                Social History   Tobacco Use  Smoking Status Former   Current packs/day: 0.00   Average packs/day: 1.5 packs/day for 55.0 years (82.5 ttl pk-yrs)   Types: Cigarettes   Start date: 08/12/1956   Quit date: 08/13/2011   Years since quitting: 11.5  Smokeless Tobacco Never    Goals Met:  Proper associated with RPD/PD & O2 Sat Independence with exercise equipment Exercise tolerated well No report of concerns or symptoms today  Goals Unmet:  Not Applicable  Comments: Pt able to follow exercise prescription today without complaint.  Will continue to monitor for progression.    Dr. Bethann Punches is Medical Director for [facility] Cardiac Rehabilitation.  Dr. Vida Rigger is Medical Director for [facility] Pulmonary Rehabilitation.

## 2023-03-13 ENCOUNTER — Encounter: Payer: Medicare HMO | Attending: Pulmonary Disease | Admitting: *Deleted

## 2023-03-13 DIAGNOSIS — J449 Chronic obstructive pulmonary disease, unspecified: Secondary | ICD-10-CM | POA: Insufficient documentation

## 2023-03-14 DIAGNOSIS — J449 Chronic obstructive pulmonary disease, unspecified: Secondary | ICD-10-CM | POA: Diagnosis not present

## 2023-03-14 DIAGNOSIS — J9621 Acute and chronic respiratory failure with hypoxia: Secondary | ICD-10-CM | POA: Diagnosis not present

## 2023-03-18 ENCOUNTER — Encounter: Payer: Medicare HMO | Admitting: *Deleted

## 2023-03-18 DIAGNOSIS — J449 Chronic obstructive pulmonary disease, unspecified: Secondary | ICD-10-CM | POA: Diagnosis not present

## 2023-03-18 NOTE — Progress Notes (Signed)
Daily Session Note  Patient Details  Name: Gregory Haynes MRN: [id number] Date of Birth: [birth year] Referring Provider:    Encounter Date: 03/18/2023  Check In:  Session Check In - 03/18/23 0751       Check-In   Supervising physician immediately available to respond to emergencies See telemetry face sheet for immediately available ER MD    Location [facility]    Staff Present Cora Collum, RN, BSN, CCRP;Other   Maxon [employer], Exercise Physiologist, Rory Percy MS, Exercise Physiologist   Virtual Visit No    Medication changes reported     No    Fall or balance concerns reported    No    Warm-up and Cool-down Performed on first and last piece of equipment    Resistance Training Performed Yes    VAD Patient? No    PAD/SET Patient? No      Pain Assessment   Currently in Pain? No/denies                Social History   Tobacco Use  Smoking Status Former   Current packs/day: 0.00   Average packs/day: 1.5 packs/day for 55.0 years (82.5 ttl pk-yrs)   Types: Cigarettes   Start date: 08/12/1956   Quit date: 08/13/2011   Years since quitting: 11.6  Smokeless Tobacco Never    Goals Met:  Proper associated with RPD/PD & O2 Sat Independence with exercise equipment Exercise tolerated well No report of concerns or symptoms today  Goals Unmet:  Not Applicable  Comments: Pt able to follow exercise prescription today without complaint.  Will continue to monitor for progression.    Dr. Bethann Punches is Medical Director for [facility] Cardiac Rehabilitation.  Dr. Vida Rigger is Medical Director for [facility] Pulmonary Rehabilitation.

## 2023-03-19 ENCOUNTER — Encounter: Payer: Self-pay | Admitting: *Deleted

## 2023-03-19 DIAGNOSIS — J449 Chronic obstructive pulmonary disease, unspecified: Secondary | ICD-10-CM

## 2023-03-19 NOTE — Progress Notes (Signed)
Pulmonary Individual Treatment Plan  Patient Details  Name: TAY HUWE MRN: [id number] Date of Birth: May 26, 1941 Referring Provider:    Initial Encounter Date:  Flowsheet Row Pulmonary Rehab from 12/05/2022 in [facility] Re[facility]  Date 12/05/22       Visit Diagnosis: Chronic obstructive pulmonary disease, unspecified COPD type (HCC)  Patient's Home Medications on Admission:  Current Outpatient Medications:    acidophilus (RISAQUAD) CAPS capsule, Take 1 capsule by mouth daily., Disp: , Rfl:    albuterol (VENTOLIN HFA) 108 (90 Base) MCG/ACT inhaler, Inhale 1-2 puffs into the lungs every 4 (four) hours as needed for wheezing or shortness of breath., Disp: 18 g, Rfl: 3   atorvastatin (LIPITOR) 40 MG tablet, Take 1 tablet (40 mg total) by mouth every evening., Disp: 90 tablet, Rfl: 3   carvedilol (COREG) 3.125 MG tablet, Take 1 tablet (3.125 mg total) by mouth 2 (two) times daily with a meal., Disp: 60 tablet, Rfl: 1   clobetasol cream (TEMOVATE) 0.05 %, Apply 1 Application topically 2 (two) times daily., Disp: , Rfl:    feeding supplement (ENSURE ENLIVE / ENSURE PLUS) LIQD, Take 237 mLs by mouth 3 (three) times daily. (Patient not taking: Reported on 12/03/2022), Disp: 237 mL, Rfl: 12   Fluticasone-Umeclidin-Vilant (TRELEGY ELLIPTA) 100-62.5-25 MCG/INH AEPB, Inhale 1 puff into the lungs daily., Disp: , Rfl:    folic acid (FOLVITE) 1 MG tablet, Take 1 mg by mouth daily. Unsure dose (Patient not taking: Reported on 12/03/2022), Disp: , Rfl:    ipratropium-albuterol (DUONEB) 0.5-2.5 (3) MG/3ML SOLN, Inhale into the lungs., Disp: , Rfl:    lisinopril (ZESTRIL) 2.5 MG tablet, Take 1 tablet (2.5 mg total) by mouth daily., Disp: 30 tablet, Rfl: 0   OXYGEN, Inhale 2 L/hr into the lungs. At night and as needed during day., Disp: , Rfl:   Past Medical History: Past Medical History:  Diagnosis Date   Anemia    Cancer (HCC)    BCC   Cataract    COPD (chronic obstructive pulmonary  disease) (HCC)    Helicobacter pylori ab+    Hyperlipidemia    Hypertension    Psoriasis    Wears hearing aid in both ears     Tobacco Use: Social History   Tobacco Use  Smoking Status Former   Current packs/day: 0.00   Average packs/day: 1.5 packs/day for 55.0 years (82.5 ttl pk-yrs)   Types: Cigarettes   Start date: 08/12/1956   Quit date: 08/13/2011   Years since quitting: 11.6  Smokeless Tobacco Never    Labs: Review Flowsheet  More data exists      Latest Ref Rng & Units 05/17/2020 05/01/2022 10/31/2022 11/01/2022 11/02/2022  Labs for ITP Cardiac and Pulmonary Rehab  Cholestrol 100 - 199 mg/dL 409  811  - - -  LDL (calc) 0 - 99 mg/dL 79  70  - - -  HDL-C >91 mg/dL 48  49  - - -  Trlycerides 0 - 149 mg/dL 76  84  - - -  Hemoglobin A1c 4.8 - 5.6 % - - 6.3  - -  PH, Arterial 7.35 - 7.45 - - 7.38  7.36  7.43  7.47   PCO2 arterial 32 - 48 mmHg - - 71  80  69  62   Bicarbonate 20.0 - 28.0 mmol/L - - 48.4  42.0  45.2  45.6  45.8  45.1   O2 Saturation % - - 67  98.3  95.3  94.7  98.9  99.2     Details       Multiple values from one day are sorted in reverse-chronological order          Pulmonary Assessment Scores:  Pulmonary Assessment Scores     Row Name 12/05/22 1617         ADL UCSD   ADL Phase Entry     SOB Score total 27     Rest 0     Walk 0     Stairs 2     Bath 0     Dress 0     Shop 1       CAT Score   CAT Score 6       mMRC Score   mMRC Score 1              UCSD: Self-administered rating of dyspnea associated with activities of daily living (ADLs) 6-point scale (0 = "not at all" to 5 = "maximal or unable to do because of breathlessness")  Scoring Scores range from 0 to 120.  Minimally important difference is 5 units  CAT: CAT can identify the health impairment of COPD patients and is better correlated with disease progression.  CAT has a scoring range of zero to 40. The CAT score is classified into four groups of low (less than 10),  medium (10 - 20), high (21-30) and very high (31-40) based on the impact level of disease on health status. A CAT score over 10 suggests significant symptoms.  A worsening CAT score could be explained by an exacerbation, poor medication adherence, poor inhaler technique, or progression of COPD or comorbid conditions.  CAT MCID is 2 points  mMRC: mMRC (Modified Medical Research Council) Dyspnea Scale is used to assess the degree of baseline functional disability in patients of respiratory disease due to dyspnea. No minimal important difference is established. A decrease in score of 1 point or greater is considered a positive change.   Pulmonary Function Assessment:   Exercise Target Goals: Exercise Program Goal: Individual exercise prescription set using results from initial 6 min walk test and THRR while considering  patient's activity barriers and safety.   Exercise Prescription Goal: Initial exercise prescription builds to 30-45 minutes a day of aerobic activity, 2-3 days per week.  Home exercise guidelines will be given to patient during program as part of exercise prescription that the participant will acknowledge.  Education: Aerobic Exercise: - Group verbal and visual presentation on the components of exercise prescription. Introduces F.I.T.T principle from ACSM for exercise prescriptions.  Reviews F.I.T.T. principles of aerobic exercise including progression. Written material given at graduation.   Education: Resistance Exercise: - Group verbal and visual presentation on the components of exercise prescription. Introduces F.I.T.T principle from ACSM for exercise prescriptions  Reviews F.I.T.T. principles of resistance exercise including progression. Written material given at graduation.    Education: Exercise & Equipment Safety: - Individual verbal instruction and demonstration of equipment use and safety with use of the equipment. Flowsheet Row Pulmonary Rehab from 12/05/2022 in  [facility] Pulmonary Re[facility]  Education need identified 12/05/22  Date 12/05/22  Educator KW  Instruction Review Code 1- Verbalizes Understanding       Education: Exercise Physiology & General Exercise Guidelines: - Group verbal and written instruction with models to review the exercise physiology of the cardiovascular system and associated critical values. Provides general exercise guidelines with specific guidelines to those with heart or lung disease.    Education: Flexibility,  Balance, Mind/Body Relaxation: - Group verbal and visual presentation with interactive activity on the components of exercise prescription. Introduces F.I.T.T principle from ACSM for exercise prescriptions. Reviews F.I.T.T. principles of flexibility and balance exercise training including progression. Also discusses the mind body connection.  Reviews various relaxation techniques to help reduce and manage stress (i.e. Deep breathing, progressive muscle relaxation, and visualization). Balance handout provided to take home. Written material given at graduation.   Activity Barriers & Risk Stratification:  Activity Barriers & Cardiac Risk Stratification - 12/05/22 1630       Activity Barriers & Cardiac Risk Stratification   Activity Barriers Shortness of Breath;Balance Concerns;Deconditioning;Muscular Weakness;Assistive Device             6 Minute Walk:  6 Minute Walk     Row Name 12/05/22 1620         6 Minute Walk   Phase Initial     Distance 890 feet     Walk Time 6 minutes     # of Rest Breaks 0     MPH 1.68     METS 2.01     RPE 11     Perceived Dyspnea  1     VO2 Peak 7.03     Symptoms Yes (comment)     Comments SOB     Resting HR 114 bpm     Resting BP 112/56     Resting Oxygen Saturation  94 %     Exercise Oxygen Saturation  during 6 min walk 87 %     Max Ex. HR 125 bpm     Max Ex. BP 124/60     2 Minute Post BP 106/60       Interval HR   1 Minute HR 122     2 Minute HR 124      3 Minute HR 125     4 Minute HR 124     5 Minute HR 123     6 Minute HR 125     2 Minute Post HR 111     Interval Heart Rate? Yes       Interval Oxygen   Interval Oxygen? Yes     Baseline Oxygen Saturation % 94 %     1 Minute Oxygen Saturation % 92 %     1 Minute Liters of Oxygen 2 L  continuous     2 Minute Oxygen Saturation % 90 %     2 Minute Liters of Oxygen 2 L     3 Minute Oxygen Saturation % 89 %     3 Minute Liters of Oxygen 2 L     4 Minute Oxygen Saturation % 88 %     4 Minute Liters of Oxygen 2 L     5 Minute Oxygen Saturation % 87 %     5 Minute Liters of Oxygen 2 L     6 Minute Oxygen Saturation % 87 %     6 Minute Liters of Oxygen 2 L     2 Minute Post Oxygen Saturation % 96 %     2 Minute Post Liters of Oxygen 2 L             Oxygen Initial Assessment:  Oxygen Initial Assessment - 12/05/22 1617       Home Oxygen   Home Oxygen Device Portable Concentrator;Home Concentrator    Sleep Oxygen Prescription Continuous;CPAP    Liters per minute 2    Home Exercise Oxygen  Prescription Continuous    Liters per minute 2    Home Resting Oxygen Prescription Continuous    Liters per minute 2    Compliance with Home Oxygen Use Yes      Initial 6 min Walk   Oxygen Used Pulsed    Liters per minute 2      Program Oxygen Prescription   Program Oxygen Prescription Continuous    Liters per minute 2      Intervention   Short Term Goals To learn and exhibit compliance with exercise, home and travel O2 prescription;To learn and understand importance of monitoring SPO2 with pulse oximeter and demonstrate accurate use of the pulse oximeter.;To learn and understand importance of maintaining oxygen saturations>88%;To learn and demonstrate proper pursed lip breathing techniques or other breathing techniques. ;To learn and demonstrate proper use of respiratory medications    Long  Term Goals Exhibits compliance with exercise, home  and travel O2 prescription;Verbalizes  importance of monitoring SPO2 with pulse oximeter and return demonstration;Maintenance of O2 saturations>88%;Exhibits proper breathing techniques, such as pursed lip breathing or other method taught during program session;Compliance with respiratory medication;Demonstrates proper use of MDI's             Oxygen Re-Evaluation:  Oxygen Re-Evaluation     Row Name 02/18/23 0815 02/25/23 0741           Program Oxygen Prescription   Program Oxygen Prescription Continuous Continuous      Liters per minute 2 2        Home Oxygen   Home Oxygen Device Portable Concentrator;Home Concentrator Portable Concentrator;Home Concentrator      Sleep Oxygen Prescription Continuous;CPAP Continuous;CPAP      Liters per minute 2 2      Home Exercise Oxygen Prescription Continuous Continuous      Liters per minute 2 2      Home Resting Oxygen Prescription Continuous Continuous      Liters per minute 2 2      Compliance with Home Oxygen Use Yes Yes        Goals/Expected Outcomes   Short Term Goals To learn and demonstrate proper pursed lip breathing techniques or other breathing techniques.  To learn and demonstrate proper pursed lip breathing techniques or other breathing techniques. ;To learn and exhibit compliance with exercise, home and travel O2 prescription;To learn and understand importance of monitoring SPO2 with pulse oximeter and demonstrate accurate use of the pulse oximeter.;To learn and understand importance of maintaining oxygen saturations>88%;To learn and demonstrate proper use of respiratory medications      Long  Term Goals Exhibits proper breathing techniques, such as pursed lip breathing or other method taught during program session Exhibits compliance with exercise, home  and travel O2 prescription;Verbalizes importance of monitoring SPO2 with pulse oximeter and return demonstration;Maintenance of O2 saturations>88%;Exhibits proper breathing techniques, such as pursed lip breathing or  other method taught during program session;Compliance with respiratory medication      Comments Reviewed PLB technique with pt.  Talked about how it works and it's importance in maintaining their exercise saturations. Patient reports compliance with home oxygen use and respiratory medications. He does have a pulse ox at home and consistently monitors SaO2 levels and reports that they ususally stay above 88%. He reports that if he gets SOB or notices decreases in SaO2 levels he feels comfortable using PLB.      Goals/Expected Outcomes Short: Become more profiecient at using PLB. Long: Become independent at using PLB. Short: continue to  attend pulmonary rehab consistently to work on strength and lung function. Long: continue to use PLB independently               Oxygen Discharge (Final Oxygen Re-Evaluation):  Oxygen Re-Evaluation - 02/25/23 0741       Program Oxygen Prescription   Program Oxygen Prescription Continuous    Liters per minute 2      Home Oxygen   Home Oxygen Device Portable Concentrator;Home Concentrator    Sleep Oxygen Prescription Continuous;CPAP    Liters per minute 2    Home Exercise Oxygen Prescription Continuous    Liters per minute 2    Home Resting Oxygen Prescription Continuous    Liters per minute 2    Compliance with Home Oxygen Use Yes      Goals/Expected Outcomes   Short Term Goals To learn and demonstrate proper pursed lip breathing techniques or other breathing techniques. ;To learn and exhibit compliance with exercise, home and travel O2 prescription;To learn and understand importance of monitoring SPO2 with pulse oximeter and demonstrate accurate use of the pulse oximeter.;To learn and understand importance of maintaining oxygen saturations>88%;To learn and demonstrate proper use of respiratory medications    Long  Term Goals Exhibits compliance with exercise, home  and travel O2 prescription;Verbalizes importance of monitoring SPO2 with pulse oximeter and  return demonstration;Maintenance of O2 saturations>88%;Exhibits proper breathing techniques, such as pursed lip breathing or other method taught during program session;Compliance with respiratory medication    Comments Patient reports compliance with home oxygen use and respiratory medications. He does have a pulse ox at home and consistently monitors SaO2 levels and reports that they ususally stay above 88%. He reports that if he gets SOB or notices decreases in SaO2 levels he feels comfortable using PLB.    Goals/Expected Outcomes Short: continue to attend pulmonary rehab consistently to work on strength and lung function. Long: continue to use PLB independently             Initial Exercise Prescription:  Initial Exercise Prescription - 12/05/22 1600       Date of Initial Exercise RX and Referring Provider   Date 12/05/22      Oxygen   Oxygen Continuous    Liters 2    Maintain Oxygen Saturation 88% or higher      NuStep   Level 1    SPM 80    Minutes 15    METs 2      REL-XR   Level 1    Speed 50    Minutes 15    METs 2      Biostep-RELP   Level 1    SPM 50    Minutes 15    METs 2      Track   Laps 20    Minutes 15    METs 2.09      Prescription Details   Frequency (times per week) 3    Duration Progress to 30 minutes of continuous aerobic without signs/symptoms of physical distress      Intensity   THRR 40-80% of Max Heartrate 122- 133    Ratings of Perceived Exertion 11-13    Perceived Dyspnea 0-4      Progression   Progression Continue to progress workloads to maintain intensity without signs/symptoms of physical distress.      Resistance Training   Training Prescription Yes    Weight 3 lb    Reps 10-15  Perform Capillary Blood Glucose checks as needed.  Exercise Prescription Changes:   Exercise Prescription Changes     Row Name 12/05/22 1600 03/05/23 1000           Response to Exercise   Blood Pressure (Admit) 112/56  110/60      Blood Pressure (Exercise) 124/60 142/66      Blood Pressure (Exit) 106/60 106/62      Heart Rate (Admit) 114 bpm 91 bpm      Heart Rate (Exercise) 125 bpm 122 bpm      Heart Rate (Exit) 111 bpm 114 bpm      Oxygen Saturation (Admit) 94 % 95 %      Oxygen Saturation (Exercise) 87 % 96 %      Oxygen Saturation (Exit) 96 % 94 %      Rating of Perceived Exertion (Exercise) 11 15      Perceived Dyspnea (Exercise) 1 2      Symptoms SOB --      Comments walk test results --        Resistance Training   Training Prescription -- Yes      Weight -- 4 lb      Reps -- 10-15        Oxygen   Oxygen -- Continuous      Liters -- 2        Recumbant Bike   Level -- 2      Watts -- 18      Minutes -- 15      METs -- 2.68        NuStep   Level -- 3      Minutes -- 15      METs -- 2.1        Biostep-RELP   Level -- 1      Minutes -- 15      METs -- 2        Track   Laps -- 19      Minutes -- 15      METs -- 2.03        Oxygen   Maintain Oxygen Saturation -- 88% or higher               Exercise Comments:   Exercise Comments     Row Name 02/18/23 0814           Exercise Comments First full day of exercise!  Patient was oriented to gym and equipment including functions, settings, policies, and procedures.  Patient's individual exercise prescription and treatment plan were reviewed.  All starting workloads were established based on the results of the 6 minute walk test done at initial orientation visit.  The plan for exercise progression was also introduced and progression will be customized based on patient's performance and goals.                Exercise Goals and Review:   Exercise Goals     Row Name 12/05/22 1632             Exercise Goals   Increase Physical Activity Yes       Intervention Provide advice, education, support and counseling about physical activity/exercise needs.;Develop an individualized exercise prescription for aerobic and  resistive training based on initial evaluation findings, risk stratification, comorbidities and participant's personal goals.       Expected Outcomes Short Term: Attend rehab on a regular basis to increase amount of physical activity.;Long Term: Add in home  exercise to make exercise part of routine and to increase amount of physical activity.;Long Term: Exercising regularly at least 3-5 days a week.       Increase Strength and Stamina Yes       Intervention Provide advice, education, support and counseling about physical activity/exercise needs.;Develop an individualized exercise prescription for aerobic and resistive training based on initial evaluation findings, risk stratification, comorbidities and participant's personal goals.       Expected Outcomes Short Term: Increase workloads from initial exercise prescription for resistance, speed, and METs.;Short Term: Perform resistance training exercises routinely during rehab and add in resistance training at home;Long Term: Improve cardiorespiratory fitness, muscular endurance and strength as measured by increased METs and functional capacity ( )       Able to understand and use rate of perceived exertion (RPE) scale Yes       Intervention Provide education and explanation on how to use RPE scale       Expected Outcomes Short Term: Able to use RPE daily in rehab to express subjective intensity level;Long Term:  Able to use RPE to guide intensity level when exercising independently       Able to understand and use Dyspnea scale Yes       Intervention Provide education and explanation on how to use Dyspnea scale       Expected Outcomes Short Term: Able to use Dyspnea scale daily in rehab to express subjective sense of shortness of breath during exertion;Long Term: Able to use Dyspnea scale to guide intensity level when exercising independently       Knowledge and understanding of Target Heart Rate Range (THRR) Yes       Intervention Provide education and  explanation of THRR including how the numbers were predicted and where they are located for reference       Expected Outcomes Short Term: Able to state/look up THRR;Long Term: Able to use THRR to govern intensity when exercising independently;Short Term: Able to use daily as guideline for intensity in rehab       Able to check pulse independently Yes       Intervention Provide education and demonstration on how to check pulse in carotid and radial arteries.;Review the importance of being able to check your own pulse for safety during independent exercise       Expected Outcomes Long Term: Able to check pulse independently and accurately;Short Term: Able to explain why pulse checking is important during independent exercise       Understanding of Exercise Prescription Yes       Intervention Provide education, explanation, and written materials on patient's individual exercise prescription       Expected Outcomes Short Term: Able to explain program exercise prescription;Long Term: Able to explain home exercise prescription to exercise independently                Exercise Goals Re-Evaluation :  Exercise Goals Re-Evaluation     Row Name 12/24/22 1453 02/18/23 0814 02/25/23 0737 03/05/23 1011       Exercise Goal Re-Evaluation   Exercise Goals Review Increase Physical Activity;Understanding of Exercise Prescription;Increase Strength and Stamina Able to understand and use rate of perceived exertion (RPE) scale;Able to understand and use Dyspnea scale;Understanding of Exercise Prescription Increase Physical Activity;Increase Strength and Stamina Increase Physical Activity;Understanding of Exercise Prescription;Increase Strength and Stamina    Comments Patient did not complete their first day of exercise yet. Patient had an elevated resting HR at his orientation and per Dr.  Aleskerov, had to get a holter ordered to rule out any abnormalities. Patient needs to follow up with the cardiologist and get  clearance from them and Dr. Mervyn Skeeters before starting rehab back up. Reviewed RPE  and dyspnea scale, THR and program prescription with pt today.  Pt voiced understanding and was given a copy of goals to take home. After Bill's orientation he was out for an extended period of time due to health issues and hospitalization. He has now resently started the program and is attending consistently. He is tolerating exercise well and getting established in his exercise routine. Annette Stable has started his exercise sessions. He has increased his Nustep T4 level to 3 and his handweight to 4 LBs. His RPE is 12 (one at 15 on the Rec Bike) . Will contiue to encourage him to increase workloads as tolerated and  tomonitor his exericse progression.    Expected Outcomes Short: Get clearance to rehab Long: Build up overall strength and stamina Short: Use RPE daily to regulate intensity. Long: Follow program prescription in THR. Short: attend pulmonary rehab consistently and continue to get estalished in exercise routine. Long: become independent with exercise routine. UJW:[id number] workloads as tolerated with goal of increased stamina and strength.   LTG: Continued exercise progression as tolerated during program and after discharge.             Discharge Exercise Prescription (Final Exercise Prescription Changes):  Exercise Prescription Changes - 03/05/23 1000       Response to Exercise   Blood Pressure (Admit) 110/60    Blood Pressure (Exercise) 142/66    Blood Pressure (Exit) 106/62    Heart Rate (Admit) 91 bpm    Heart Rate (Exercise) 122 bpm    Heart Rate (Exit) 114 bpm    Oxygen Saturation (Admit) 95 %    Oxygen Saturation (Exercise) 96 %    Oxygen Saturation (Exit) 94 %    Rating of Perceived Exertion (Exercise) 15    Perceived Dyspnea (Exercise) 2      Resistance Training   Training Prescription Yes    Weight 4 lb    Reps 10-15      Oxygen   Oxygen Continuous    Liters 2      Recumbant Bike   Level 2     Watts 18    Minutes 15    METs 2.68      NuStep   Level 3    Minutes 15    METs 2.1      Biostep-RELP   Level 1    Minutes 15    METs 2      Track   Laps 19    Minutes 15    METs 2.03      Oxygen   Maintain Oxygen Saturation 88% or higher             Nutrition:  Target Goals: Understanding of nutrition guidelines, daily intake of sodium 1500mg , cholesterol 200mg , calories 30% from fat and 7% or less from saturated fats, daily to have 5 or more servings of fruits and vegetables.  Education: All About Nutrition: -Group instruction provided by verbal, written material, interactive activities, discussions, models, and posters to present general guidelines for heart healthy nutrition including fat, fiber, MyPlate, the role of sodium in heart healthy nutrition, utilization of the nutrition label, and utilization of this knowledge for meal planning. Follow up email sent as well. Written material given at graduation. Flowsheet Row Pulmonary Rehab from 12/05/2022 in  [facility] Cardiac and Pulmonary Rehab  Education need identified 12/05/22       Biometrics:  Pre Biometrics - 12/05/22 1630       Pre Biometrics   Height 5' 11.5" (1.816 m)    Weight 180 lb 8 oz (81.9 kg)    Waist Circumference --   declined   Hip Circumference --   declined   BMI (Calculated) 24.83    Single Leg Stand 5.5 seconds              Nutrition Therapy Plan and Nutrition Goals:  Nutrition Therapy & Goals - 12/05/22 1619       Intervention Plan   Intervention Prescribe, educate and counsel regarding individualized specific dietary modifications aiming towards targeted core components such as weight, hypertension, lipid management, diabetes, heart failure and other comorbidities.    Expected Outcomes Short Term Goal: Understand basic principles of dietary content, such as calories, fat, sodium, cholesterol and nutrients.;Short Term Goal: A plan has been developed with personal nutrition goals set  during dietitian appointment.;Long Term Goal: Adherence to prescribed nutrition plan.             Nutrition Assessments:  MEDIFICTS Score Key: ?70 Need to make dietary changes  40-70 Heart Healthy Diet ? 40 Therapeutic Level Cholesterol Diet  Flowsheet Row Pulmonary Rehab from 12/05/2022 in [facility] Cardiac and Pulmonary Rehab  Picture Your Plate Total Score on Admission 81      Picture Your Plate Scores: <16 Unhealthy dietary pattern with much room for improvement. 41-50 Dietary pattern unlikely to meet recommendations for good health and room for improvement. 51-60 More healthful dietary pattern, with some room for improvement.  >60 Healthy dietary pattern, although there may be some specific behaviors that could be improved.   Nutrition Goals Re-Evaluation:  Nutrition Goals Re-Evaluation     Row Name 02/25/23 0747             Goals   Comment Patient decided to cancel RD apt and defer meeting for now. He plans to attend the nutrition group education class in the future.                Nutrition Goals Discharge (Final Nutrition Goals Re-Evaluation):  Nutrition Goals Re-Evaluation - 02/25/23 0747       Goals   Comment Patient decided to cancel RD apt and defer meeting for now. He plans to attend the nutrition group education class in the future.             Psychosocial: Target Goals: Acknowledge presence or absence of significant depression and/or stress, maximize coping skills, provide positive support system. Participant is able to verbalize types and ability to use techniques and skills needed for reducing stress and depression.   Education: Stress, Anxiety, and Depression - Group verbal and visual presentation to define topics covered.  Reviews how body is impacted by stress, anxiety, and depression.  Also discusses healthy ways to reduce stress and to treat/manage anxiety and depression.  Written material given at graduation.   Education: Sleep  Hygiene -Provides group verbal and written instruction about how sleep can affect your health.  Define sleep hygiene, discuss sleep cycles and impact of sleep habits. Review good sleep hygiene tips.    Initial Review & Psychosocial Screening:  Initial Psych Review & Screening - 12/03/22 1033       Initial Review   Current issues with None Identified      Family Dynamics   Good Support System? Yes  wife, family     Barriers   Psychosocial barriers to participate in program There are no identifiable barriers or psychosocial needs.      Screening Interventions   Interventions Provide feedback about the scores to participant;Encouraged to exercise;To provide support and resources with identified psychosocial needs    Expected Outcomes Short Term goal: Utilizing psychosocial counselor, staff and physician to assist with identification of specific Stressors or current issues interfering with healing process. Setting desired goal for each stressor or current issue identified.;Long Term Goal: Stressors or current issues are controlled or eliminated.;Short Term goal: Identification and review with participant of any Quality of Life or Depression concerns found by scoring the questionnaire.;Long Term goal: The participant improves quality of Life and PHQ9 Scores as seen by post scores and/or verbalization of changes             Quality of Life Scores:  Scores of 19 and below usually indicate a poorer quality of life in these areas.  A difference of  2-3 points is a clinically meaningful difference.  A difference of 2-3 points in the total score of the Quality of Life Index has been associated with significant improvement in overall quality of life, self-image, physical symptoms, and general health in studies assessing change in quality of life.  PHQ-9: Review Flowsheet  More data exists      12/05/2022 07/01/2022 05/01/2022 07/01/2019 06/24/2018  Depression screen PHQ 2/9  Decreased  Interest 0 0 0 0 0  Down, Depressed, Hopeless 0 0 0 0 1  PHQ - 2 Score 0 0 0 0 1  Altered sleeping 0 0 0 - -  Tired, decreased energy 1 0 0 - -  Change in appetite 0 0 0 - -  Feeling bad or failure about yourself  0 0 0 - -  Trouble concentrating 0 0 0 - -  Moving slowly or fidgety/restless 0 0 0 - -  Suicidal thoughts 0 0 0 - -  PHQ-9 Score 1 0 0 - -  Difficult doing work/chores Not difficult at all Not difficult at all Not difficult at all - -    Details           Interpretation of Total Score  Total Score Depression Severity:  1-4 = Minimal depression, 5-9 = Mild depression, 10-14 = Moderate depression, 15-19 = Moderately severe depression, 20-27 = Severe depression   Psychosocial Evaluation and Intervention:  Psychosocial Evaluation - 12/03/22 1033       Psychosocial Evaluation & Interventions   Comments 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 He has been on oxygen for 12 years and feels comfortable managing it. After his most recent hospitalization, PT came to his house to show him some things. However, he and his wife admit that it wasn't too much to go off of and mainly he doesn't feel motivated to do the exercises alone. So they are both ready for him to get started in the program. He likes people and being in a group setting. He and his wife manage his health care needs and he states he feels like he is in a good spot mentally. He and his wife joked that the biggest stressor in his life right now is that she won't let him have the dessert he wants every night. He is motivated to attend the program    Expected Outcomes Short: attend pulmonary rehab for education and exercise. Long: develop and maintain positive self care habits.  Continue Psychosocial Services  Follow up required by staff             Psychosocial Re-Evaluation:  Psychosocial Re-Evaluation     Row Name 02/25/23 [id number]             Psychosocial Re-Evaluation   Current issues with None  Identified       Comments Patient reports no new concerns or changes in sleep, stress, or mental health. He has recently started the program after a time gap between orientation and first day of class due to health concerns. He reports he is enjoying coming to class.       Expected Outcomes Short: continue to attend pulmonary rehab consistenly for mental health benefit. Long: maintain good mental health habits.       Interventions Encouraged to attend Pulmonary Rehabilitation for the exercise       Continue Psychosocial Services  Follow up required by staff                Psychosocial Discharge (Final Psychosocial Re-Evaluation):  Psychosocial Re-Evaluation - 02/25/23 0752       Psychosocial Re-Evaluation   Current issues with None Identified    Comments Patient reports no new concerns or changes in sleep, stress, or mental health. He has recently started the program after a time gap between orientation and first day of class due to health concerns. He reports he is enjoying coming to class.    Expected Outcomes Short: continue to attend pulmonary rehab consistenly for mental health benefit. Long: maintain good mental health habits.    Interventions Encouraged to attend Pulmonary Rehabilitation for the exercise    Continue Psychosocial Services  Follow up required by staff             Education: Education Goals: Education classes will be provided on a weekly basis, covering required topics. Participant will state understanding/return demonstration of topics presented.  Learning Barriers/Preferences:  Learning Barriers/Preferences - 12/03/22 1014       Learning Barriers/Preferences   Learning Barriers Hearing    Learning Preferences Individual Instruction             General Pulmonary Education Topics:  Infection Prevention: - Provides verbal and written material to individual with discussion of infection control including proper hand washing and proper equipment  cleaning during exercise session. Flowsheet Row Pulmonary Rehab from 12/05/2022 in [facility] Cardiac and Pulmonary Rehab  Education need identified 12/05/22  Date 12/05/22  Educator KW  Instruction Review Code 1- Verbalizes Understanding       Falls Prevention: - Provides verbal and written material to individual with discussion of falls prevention and safety. Flowsheet Row Pulmonary Rehab from 12/05/2022 in [facility] Cardiac and Pulmonary Rehab  Education need identified 12/05/22  Date 12/05/22  Educator KW  Instruction Review Code 1- Verbalizes Understanding       Chronic Lung Disease Review: - Group verbal instruction with posters, models, PowerPoint presentations and videos,  to review new updates, new respiratory medications, new advancements in procedures and treatments. Providing information on websites and "800" numbers for continued self-education. Includes information about supplement oxygen, available portable oxygen systems, continuous and intermittent flow rates, oxygen safety, concentrators, and Medicare reimbursement for oxygen. Explanation of Pulmonary Drugs, including class, frequency, complications, importance of spacers, rinsing mouth after steroid MDI's, and proper cleaning methods for nebulizers. Review of basic lung anatomy and physiology related to function, structure, and complications of lung disease. Review of risk factors. Discussion about methods for diagnosing sleep apnea  and types of masks and machines for OSA. Includes a review of the use of types of environmental controls: home humidity, furnaces, filters, dust mite/pet prevention, HEPA vacuums. Discussion about weather changes, air quality and the benefits of nasal washing. Instruction on Warning signs, infection symptoms, calling MD promptly, preventive modes, and value of vaccinations. Review of effective airway clearance, coughing and/or vibration techniques. Emphasizing that all should Create an Action Plan. Written  material given at graduation. Flowsheet Row Pulmonary Rehab from 12/05/2022 in [facility] Cardiac and Pulmonary Rehab  Education need identified 12/05/22       AED/CPR: - Group verbal and written instruction with the use of models to demonstrate the basic use of the AED with the basic ABC's of resuscitation.    Anatomy and Cardiac Procedures: - Group verbal and visual presentation and models provide information about basic cardiac anatomy and function. Reviews the testing methods done to diagnose heart disease and the outcomes of the test results. Describes the treatment choices: Medical Management, Angioplasty, or Coronary Bypass Surgery for treating various heart conditions including Myocardial Infarction, Angina, Valve Disease, and Cardiac Arrhythmias.  Written material given at graduation.   Medication Safety: - Group verbal and visual instruction to review commonly prescribed medications for heart and lung disease. Reviews the medication, class of the drug, and side effects. Includes the steps to properly store meds and maintain the prescription regimen.  Written material given at graduation.   Other: -Provides group and verbal instruction on various topics (see comments)   Knowledge Questionnaire Score:  Knowledge Questionnaire Score - 12/05/22 1617       Knowledge Questionnaire Score   Pre Score 16/18              Core Components/Risk Factors/Patient Goals at Admission:  Personal Goals and Risk Factors at Admission - 12/05/22 1633       Core Components/Risk Factors/Patient Goals on Admission    Weight Management Yes;Weight Maintenance   Already lost 14 lbs since hospital visit   Intervention Weight Management: Develop a combined nutrition and exercise program designed to reach desired caloric intake, while maintaining appropriate intake of nutrient and fiber, sodium and fats, and appropriate energy expenditure required for the weight goal.;Weight Management: Provide  education and appropriate resources to help participant work on and attain dietary goals.;Weight Management/Obesity: Establish reasonable short term and long term weight goals.    Admit Weight 180 lb (81.6 kg)    Goal Weight: Short Term 180 lb (81.6 kg)    Goal Weight: Long Term 180 lb (81.6 kg)    Expected Outcomes Short Term: Continue to assess and modify interventions until short term weight is achieved;Long Term: Adherence to nutrition and physical activity/exercise program aimed toward attainment of established weight goal;Weight Maintenance: Understanding of the daily nutrition guidelines, which includes 25-35% calories from fat, 7% or less cal from saturated fats, less than 200mg  cholesterol, less than 1.5gm of sodium, & 5 or more servings of fruits and vegetables daily;Understanding recommendations for meals to include 15-35% energy as protein, 25-35% energy from fat, 35-60% energy from carbohydrates, less than 200mg  of dietary cholesterol, 20-35 gm of total fiber daily;Understanding of distribution of calorie intake throughout the day with the consumption of 4-5 meals/snacks    Improve shortness of breath with ADL's Yes    Intervention Provide education, individualized exercise plan and daily activity instruction to help decrease symptoms of SOB with activities of daily living.    Expected Outcomes Short Term: Improve cardiorespiratory fitness to achieve a  reduction of symptoms when performing ADLs;Long Term: Be able to perform more ADLs without symptoms or delay the onset of symptoms    Hypertension Yes    Intervention Provide education on lifestyle modifcations including regular physical activity/exercise, weight management, moderate sodium restriction and increased consumption of fresh fruit, vegetables, and low fat dairy, alcohol moderation, and smoking cessation.;Monitor prescription use compliance.    Expected Outcomes Short Term: Continued assessment and intervention until BP is < 140/56mm  HG in hypertensive participants. < 130/32mm HG in hypertensive participants with diabetes, heart failure or chronic kidney disease.;Long Term: Maintenance of blood pressure at goal levels.    Lipids Yes    Intervention Provide education and support for participant on nutrition & aerobic/resistive exercise along with prescribed medications to achieve LDL 70mg , HDL >40mg .    Expected Outcomes Short Term: Participant states understanding of desired cholesterol values and is compliant with medications prescribed. Participant is following exercise prescription and nutrition guidelines.;Long Term: Cholesterol controlled with medications as prescribed, with individualized exercise RX and with personalized nutrition plan. Value goals: LDL < 70mg , HDL > 40 mg.             Education:Diabetes - Individual verbal and written instruction to review signs/symptoms of diabetes, desired ranges of glucose level fasting, after meals and with exercise. Acknowledge that pre and post exercise glucose checks will be done for 3 sessions at entry of program.   Know Your Numbers and Heart Failure: - Group verbal and visual instruction to discuss disease risk factors for cardiac and pulmonary disease and treatment options.  Reviews associated critical values for Overweight/Obesity, Hypertension, Cholesterol, and Diabetes.  Discusses basics of heart failure: signs/symptoms and treatments.  Introduces Heart Failure Zone chart for action plan for heart failure.  Written material given at graduation.   Core Components/Risk Factors/Patient Goals Review:   Goals and Risk Factor Review     Row Name 02/25/23 0757             Core Components/Risk Factors/Patient Goals Review   Personal Goals Review Hypertension;Lipids       Review Patient reports that he consistently takes all BP and cholesterol meds and has a good routine with his medications and is currently not having any problems with meds. He checks his BP every day  at home 2 times a day. He just got a new BP machine.       Expected Outcomes Short: continue to monitor BP at home and take all meds as prescribed. Long: continue to control risk factors with meds and healthy lifestyle.                Core Components/Risk Factors/Patient Goals at Discharge (Final Review):   Goals and Risk Factor Review - 02/25/23 0757       Core Components/Risk Factors/Patient Goals Review   Personal Goals Review Hypertension;Lipids    Review Patient reports that he consistently takes all BP and cholesterol meds and has a good routine with his medications and is currently not having any problems with meds. He checks his BP every day at home 2 times a day. He just got a new BP machine.    Expected Outcomes Short: continue to monitor BP at home and take all meds as prescribed. Long: continue to control risk factors with meds and healthy lifestyle.             ITP Comments:  ITP Comments     Row Name 12/03/22 1037 12/05/22 1612 12/09/22 1242 12/19/22 1117  12/25/22 0759   ITP Comments Initial phone call completed. Diagnosis can be found in [facility] 4/11. EP Orientation scheduled for Thursday 4/25 at 2pm. Completed and gym orientation. Initial ITP created and sent for review to Dr. Vida Rigger, Medical Director. Per Dr. Karna Christmas, pause pulmonary rehab appointments- would like to order a holter monitor and be evaluated and cleared to go from a cardiac stand point until he returns. Pt was tachycardic at orient. Left message for patient and wife to call back to discuss appts. They saw Dr. Roberts Gaudy nurse on 4/26. Called and spoke with patient's wife- patient is due to see a cardiologist on 6/3 to follow up on holter monitor results. Patient had a holter orderd per Dr. Karna Christmas after he was notified that patient had elevated resting HR at pulmonary rehab orientation. Patient needs clearance from cardiologist and Dr. Karna Christmas before starting pulmonary rehab. Wife and patient aware  they need written clearance from both MDs. Wife and patient will call us after cardiology appt to keep [place] updated. Placing patient on medical hold at this time before starting rehab. 30 Day review completed. Medical Director ITP review done, changes made as directed, and signed approval by Medical Director.   remains on medical hold    Row Name 01/16/23 1417 01/22/23 0725 02/03/23 1618 02/18/23 0814 02/18/23 1356   ITP Comments Called pt to follow up from MD appt for clearance to start rehab.  Note show no findings and needing rehab.  Left message for patient. Pt has not yet started rehab. 30 Day review completed. Medical Director ITP review done, changes made as directed, and signed approval by Medical Director.   waiting to start after medical clearance Called and spoke with pt. He has been cleared by cardiology to begin pulmonary rehab. Pt will start rehab 02/18/23 at 7:15 am. First full day of exercise!  Patient was oriented to gym and equipment including functions, settings, policies, and procedures.  Patient's individual exercise prescription and treatment plan were reviewed.  All starting workloads were established based on the results of the 6 minute walk test done at initial orientation visit.  The plan for exercise progression was also introduced and progression will be customized based on patient's performance and goals. 30 Day review completed. Medical Director ITP review done, changes made as directed, and signed approval by Medical Director.   new to program    Row Name 03/19/23 1118           ITP Comments 30 Day review completed. Medical Director ITP review done, changes made as directed, and signed approval by Medical Director.                Comments:

## 2023-03-22 DIAGNOSIS — J449 Chronic obstructive pulmonary disease, unspecified: Secondary | ICD-10-CM | POA: Diagnosis not present

## 2023-04-03 ENCOUNTER — Encounter: Payer: Medicare HMO | Admitting: *Deleted

## 2023-04-03 DIAGNOSIS — J449 Chronic obstructive pulmonary disease, unspecified: Secondary | ICD-10-CM

## 2023-04-03 NOTE — Progress Notes (Signed)
Daily Session Note  Patient Details  Name: Gregory Haynes MRN: [id number] Date of Birth: 12-13-1940 Referring Provider:    Encounter Date: 04/03/2023  Check In:  Session Check In - 04/03/23 0805       Check-In   Supervising physician immediately available to respond to emergencies See telemetry face sheet for immediately available ER MD    Location [facility]-Cardiac & Pulmonary Rehab    Staff Present Ronette Deter, BS, Exercise Physiologist;Maxon Conetta BS, , Exercise Physiologist;Caiya Bettes Katrinka Blazing, RN, ADN    Virtual Visit No    Medication changes reported     No    Fall or balance concerns reported    No    Warm-up and Cool-down Performed on first and last piece of equipment    Resistance Training Performed Yes    VAD Patient? No    PAD/SET Patient? No      Pain Assessment   Currently in Pain? No/denies                Social History   Tobacco Use  Smoking Status Former   Current packs/day: 0.00   Average packs/day: 1.5 packs/day for 55.0 years (82.5 ttl pk-yrs)   Types: Cigarettes   Start date: 08/12/1956   Quit date: 08/13/2011   Years since quitting: 11.6  Smokeless Tobacco Never    Goals Met:  Independence with exercise equipment Exercise tolerated well No report of concerns or symptoms today Strength training completed today  Goals Unmet:  Not Applicable  Comments: Pt able to follow exercise prescription today without complaint.  Will continue to monitor for progression.    Dr. Bethann Punches is Medical Director for [facility] Cardiac Rehabilitation.  Dr. Vida Rigger is Medical Director for [facility] Pulmonary Rehabilitation.

## 2023-04-06 DIAGNOSIS — J449 Chronic obstructive pulmonary disease, unspecified: Secondary | ICD-10-CM | POA: Diagnosis not present

## 2023-04-06 DIAGNOSIS — J432 Centrilobular emphysema: Secondary | ICD-10-CM | POA: Diagnosis not present

## 2023-04-08 ENCOUNTER — Encounter: Payer: Medicare HMO | Admitting: *Deleted

## 2023-04-08 DIAGNOSIS — J449 Chronic obstructive pulmonary disease, unspecified: Secondary | ICD-10-CM

## 2023-04-08 NOTE — Progress Notes (Signed)
Daily Session Note  Patient Details  Name: Gregory Haynes MRN: [id number] Date of Birth: 1941/07/08 Referring Provider:    Encounter Date: 04/08/2023  Check In:  Session Check In - 04/08/23 0818       Check-In   Supervising physician immediately available to respond to emergencies See telemetry face sheet for immediately available ER MD    Location [facility]-Cardiac & Pulmonary Rehab    Staff Present Cora Collum, RN, BSN, CCRP;Noah Tickle, BS, Exercise Physiologist;Maxon Conetta BS, , Exercise Physiologist    Virtual Visit No    Medication changes reported     No    Fall or balance concerns reported    No    Warm-up and Cool-down Performed on first and last piece of equipment    Resistance Training Performed Yes    VAD Patient? No    PAD/SET Patient? No      Pain Assessment   Currently in Pain? No/denies                Social History   Tobacco Use  Smoking Status Former   Current packs/day: 0.00   Average packs/day: 1.5 packs/day for 55.0 years (82.5 ttl pk-yrs)   Types: Cigarettes   Start date: 08/12/1956   Quit date: 08/13/2011   Years since quitting: 11.6  Smokeless Tobacco Never    Goals Met:  Proper associated with RPD/PD & O2 Sat Independence with exercise equipment Exercise tolerated well No report of concerns or symptoms today  Goals Unmet:  Not Applicable  Comments: Pt able to follow exercise prescription today without complaint.  Will continue to monitor for progression.    Dr. Bethann Punches is Medical Director for [facility] Cardiac Rehabilitation.  Dr. Vida Rigger is Medical Director for [facility] Pulmonary Rehabilitation.

## 2023-04-10 ENCOUNTER — Encounter: Payer: Medicare HMO | Admitting: *Deleted

## 2023-04-10 DIAGNOSIS — J449 Chronic obstructive pulmonary disease, unspecified: Secondary | ICD-10-CM | POA: Diagnosis not present

## 2023-04-10 NOTE — Progress Notes (Signed)
Daily Session Note  Patient Details  Name: Gregory Haynes MRN: [id number] Date of Birth: [birth year] Referring Provider:    Encounter Date: 04/10/2023  Check In:  Session Check In - 04/10/23 0758       Check-In   Supervising physician immediately available to respond to emergencies See telemetry face sheet for immediately available ER MD    Location [facility]    Staff Present Elige Ko, Guinevere Ferrari, RN, Silas Flood, BS, Exercise Physiologist    Virtual Visit No    Medication changes reported     No    Fall or balance concerns reported    No    Warm-up and Cool-down Performed on first and last piece of equipment    Resistance Training Performed Yes    VAD Patient? No    PAD/SET Patient? No      Pain Assessment   Currently in Pain? No/denies                Social History   Tobacco Use  Smoking Status Former   Current packs/day: 0.00   Average packs/day: 1.5 packs/day for 55.0 years (82.5 ttl pk-yrs)   Types: Cigarettes   Start date: 08/12/1956   Quit date: 08/13/2011   Years since quitting: 11.6  Smokeless Tobacco Never    Goals Met:  Independence with exercise equipment Exercise tolerated well No report of concerns or symptoms today Strength training completed today  Goals Unmet:  Not Applicable  Comments: Pt able to follow exercise prescription today without complaint.  Will continue to monitor for progression.    Dr. Bethann Punches is Medical Director for [facility].  Dr. Vida Rigger is Medical Director for [facility].

## 2023-04-14 DIAGNOSIS — J9621 Acute and chronic respiratory failure with hypoxia: Secondary | ICD-10-CM | POA: Diagnosis not present

## 2023-04-14 DIAGNOSIS — J449 Chronic obstructive pulmonary disease, unspecified: Secondary | ICD-10-CM | POA: Diagnosis not present

## 2023-04-15 ENCOUNTER — Encounter: Payer: Medicare HMO | Attending: Pulmonary Disease | Admitting: *Deleted

## 2023-04-15 DIAGNOSIS — J449 Chronic obstructive pulmonary disease, unspecified: Secondary | ICD-10-CM | POA: Diagnosis not present

## 2023-04-15 DIAGNOSIS — Z87891 Personal history of nicotine dependence: Secondary | ICD-10-CM | POA: Diagnosis not present

## 2023-04-15 NOTE — Progress Notes (Signed)
Daily Session Note  Patient Details  Name: Gregory Haynes MRN: [id number] Date of Birth: 1941-01-06 Referring Provider:    Encounter Date: 04/15/2023  Check In:  Session Check In - 04/15/23 0750       Check-In   Supervising physician immediately available to respond to emergencies See telemetry face sheet for immediately available ER MD    Location [facility]    Staff Present Maxon Conetta BS, , Exercise Physiologist;Noah Tickle, BS, Exercise Physiologist;Nilesh Stegall Katrinka Blazing, RN, ADN    Virtual Visit No    Medication changes reported     No    Fall or balance concerns reported    No    Warm-up and Cool-down Performed on first and last piece of equipment    Resistance Training Performed Yes    VAD Patient? No    PAD/SET Patient? No      Pain Assessment   Currently in Pain? No/denies                Social History   Tobacco Use  Smoking Status Former   Current packs/day: 0.00   Average packs/day: 1.5 packs/day for 55.0 years (82.5 ttl pk-yrs)   Types: Cigarettes   Start date: 08/12/1956   Quit date: 08/13/2011   Years since quitting: 11.6  Smokeless Tobacco Never    Goals Met:  Independence with exercise equipment Exercise tolerated well No report of concerns or symptoms today Strength training completed today  Goals Unmet:  Not Applicable  Comments: Pt able to follow exercise prescription today without complaint.  Will continue to monitor for progression.    Dr. Bethann Punches is Medical Director for [facility].  Dr. Vida Rigger is Medical Director for [facility].

## 2023-04-16 ENCOUNTER — Encounter: Payer: Self-pay | Admitting: *Deleted

## 2023-04-16 DIAGNOSIS — J449 Chronic obstructive pulmonary disease, unspecified: Secondary | ICD-10-CM

## 2023-04-16 NOTE — Progress Notes (Signed)
Pulmonary Individual Treatment Plan  Patient Details  Name: SHIKEEM SCHWARK MRN: [id number] Date of Birth: [birth year] Referring Provider:    Initial Encounter Date:  Flowsheet Row Pulmonary Rehab from 12/05/2022 in [facility]  Date 12/05/22       Visit Diagnosis: Chronic obstructive pulmonary disease, unspecified COPD type (HCC)  Patient's Home Medications on Admission:  Current Outpatient Medications:    acidophilus (RISAQUAD) CAPS capsule, Take 1 capsule by mouth daily., Disp: , Rfl:    albuterol (VENTOLIN HFA) 108 (90 Base) MCG/ACT inhaler, Inhale 1-2 puffs into the lungs every 4 (four) hours as needed for wheezing or shortness of breath., Disp: 18 g, Rfl: 3   atorvastatin (LIPITOR) 40 MG tablet, Take 1 tablet (40 mg total) by mouth every evening., Disp: 90 tablet, Rfl: 3   carvedilol (COREG) 3.125 MG tablet, Take 1 tablet (3.125 mg total) by mouth 2 (two) times daily with a meal., Disp: 60 tablet, Rfl: 1   clobetasol cream (TEMOVATE) 0.05 %, Apply 1 Application topically 2 (two) times daily., Disp: , Rfl:    feeding supplement (ENSURE ENLIVE / ENSURE PLUS) LIQD, Take 237 mLs by mouth 3 (three) times daily. (Patient not taking: Reported on 12/03/2022), Disp: 237 mL, Rfl: 12   Fluticasone-Umeclidin-Vilant (TRELEGY ELLIPTA) 100-62.5-25 MCG/INH AEPB, Inhale 1 puff into the lungs daily., Disp: , Rfl:    folic acid (FOLVITE) 1 MG tablet, Take 1 mg by mouth daily. Unsure dose (Patient not taking: Reported on 12/03/2022), Disp: , Rfl:    ipratropium-albuterol (DUONEB) 0.5-2.5 (3) MG/3ML SOLN, Inhale into the lungs., Disp: , Rfl:    lisinopril (ZESTRIL) 2.5 MG tablet, Take 1 tablet (2.5 mg total) by mouth daily., Disp: 30 tablet, Rfl: 0   OXYGEN, Inhale 2 L/hr into the lungs. At night and as needed during day., Disp: , Rfl:   Past Medical History: Past Medical History:  Diagnosis Date   Anemia    Cancer (HCC)    BCC   Cataract    COPD (chronic obstructive pulmonary  disease) (HCC)    Helicobacter pylori ab+    Hyperlipidemia    Hypertension    Psoriasis    Wears hearing aid in both ears     Tobacco Use: Social History   Tobacco Use  Smoking Status Former   Current packs/day: 0.00   Average packs/day: 1.5 packs/day for 55.0 years (82.5 ttl pk-yrs)   Types: Cigarettes   Start date: 08/12/1956   Quit date: 08/13/2011   Years since quitting: 11.6  Smokeless Tobacco Never    Labs: Review Flowsheet  More data exists      Latest Ref Rng & Units 05/17/2020 05/01/2022 10/31/2022 11/01/2022 11/02/2022  Labs for ITP Cardiac and Pulmonary Rehab  Cholestrol 100 - 199 mg/dL 284  132  - - -  LDL (calc) 0 - 99 mg/dL 79  70  - - -  HDL-C >44 mg/dL 48  49  - - -  Trlycerides 0 - 149 mg/dL 76  84  - - -  Hemoglobin A1c 4.8 - 5.6 % - - 6.3  - -  PH, Arterial 7.35 - 7.45 - - 7.38  7.36  7.43  7.47   PCO2 arterial 32 - 48 mmHg - - 71  80  69  62   Bicarbonate 20.0 - 28.0 mmol/L - - 48.4  42.0  45.2  45.6  45.8  45.1   O2 Saturation % - - 67  98.3  95.3  94.7  98.9  99.2     Details       Multiple values from one day are sorted in reverse-chronological order          Pulmonary Assessment Scores:  Pulmonary Assessment Scores     Row Name 12/05/22 1617         ADL UCSD   ADL Phase Entry     SOB Score total 27     Rest 0     Walk 0     Stairs 2     Bath 0     Dress 0     Shop 1       CAT Score   CAT Score 6       mMRC Score   mMRC Score 1              UCSD: Self-administered rating of dyspnea associated with activities of daily living (ADLs) 6-point scale (0 = "not at all" to 5 = "maximal or unable to do because of breathlessness")  Scoring Scores range from 0 to 120.  Minimally important difference is 5 units  CAT: CAT can identify the health impairment of COPD patients and is better correlated with disease progression.  CAT has a scoring range of zero to 40. The CAT score is classified into four groups of low (less than 10),  medium (10 - 20), high (21-30) and very high (31-40) based on the impact level of disease on health status. A CAT score over 10 suggests significant symptoms.  A worsening CAT score could be explained by an exacerbation, poor medication adherence, poor inhaler technique, or progression of COPD or comorbid conditions.  CAT MCID is 2 points  mMRC: mMRC (Modified Medical Research Council) Dyspnea Scale is used to assess the degree of baseline functional disability in patients of respiratory disease due to dyspnea. No minimal important difference is established. A decrease in score of 1 point or greater is considered a positive change.   Pulmonary Function Assessment:   Exercise Target Goals: Exercise Program Goal: Individual exercise prescription set using results from initial 6 min walk test and THRR while considering  patient's activity barriers and safety.   Exercise Prescription Goal: Initial exercise prescription builds to 30-45 minutes a day of aerobic activity, 2-3 days per week.  Home exercise guidelines will be given to patient during program as part of exercise prescription that the participant will acknowledge.  Education: Aerobic Exercise: - Group verbal and visual presentation on the components of exercise prescription. Introduces F.I.T.T principle from ACSM for exercise prescriptions.  Reviews F.I.T.T. principles of aerobic exercise including progression. Written material given at graduation.   Education: Resistance Exercise: - Group verbal and visual presentation on the components of exercise prescription. Introduces F.I.T.T principle from ACSM for exercise prescriptions  Reviews F.I.T.T. principles of resistance exercise including progression. Written material given at graduation.    Education: Exercise & Equipment Safety: - Individual verbal instruction and demonstration of equipment use and safety with use of the equipment. Flowsheet Row Pulmonary Rehab from 12/05/2022 in  [facility]monary [facility]  Education need identified 12/05/22  Date 12/05/22  Educator KW  Instruction Review Code 1- Verbalizes Understanding       Education: Exercise Physiology & General Exercise Guidelines: - Group verbal and written instruction with models to review the exercise physiology of the cardiovascular system and associated critical values. Provides general exercise guidelines with specific guidelines to those with heart or lung disease.    Education: Flexibility,  Balance, Mind/Body Relaxation: - Group verbal and visual presentation with interactive activity on the components of exercise prescription. Introduces F.I.T.T principle from ACSM for exercise prescriptions. Reviews F.I.T.T. principles of flexibility and balance exercise training including progression. Also discusses the mind body connection.  Reviews various relaxation techniques to help reduce and manage stress (i.e. Deep breathing, progressive muscle relaxation, and visualization). Balance handout provided to take home. Written material given at graduation.   Activity Barriers & Risk Stratification:  Activity Barriers & Cardiac Risk Stratification - 12/05/22 1630       Activity Barriers & Cardiac Risk Stratification   Activity Barriers Shortness of Breath;Balance Concerns;Deconditioning;Muscular Weakness;Assistive Device             6 Minute Walk:  6 Minute Walk     Row Name 12/05/22 1620         6 Minute Walk   Phase Initial     Distance 890 feet     Walk Time 6 minutes     # of Rest Breaks 0     MPH 1.68     METS 2.01     RPE 11     Perceived Dyspnea  1     VO2 Peak 7.03     Symptoms Yes (comment)     Comments SOB     Resting HR 114 bpm     Resting BP 112/56     Resting Oxygen Saturation  94 %     Exercise Oxygen Saturation  during 6 min walk 87 %     Max Ex. HR 125 bpm     Max Ex. BP 124/60     2 Minute Post BP 106/60       Interval HR   1 Minute HR 122     2 Minute HR 124      3 Minute HR 125     4 Minute HR 124     5 Minute HR 123     6 Minute HR 125     2 Minute Post HR 111     Interval Heart Rate? Yes       Interval Oxygen   Interval Oxygen? Yes     Baseline Oxygen Saturation % 94 %     1 Minute Oxygen Saturation % 92 %     1 Minute Liters of Oxygen 2 L  continuous     2 Minute Oxygen Saturation % 90 %     2 Minute Liters of Oxygen 2 L     3 Minute Oxygen Saturation % 89 %     3 Minute Liters of Oxygen 2 L     4 Minute Oxygen Saturation % 88 %     4 Minute Liters of Oxygen 2 L     5 Minute Oxygen Saturation % 87 %     5 Minute Liters of Oxygen 2 L     6 Minute Oxygen Saturation % 87 %     6 Minute Liters of Oxygen 2 L     2 Minute Post Oxygen Saturation % 96 %     2 Minute Post Liters of Oxygen 2 L             Oxygen Initial Assessment:  Oxygen Initial Assessment - 12/05/22 1617       Home Oxygen   Home Oxygen Device Portable Concentrator;Home Concentrator    Sleep Oxygen Prescription Continuous;CPAP    Liters per minute 2    Home Exercise Oxygen  Prescription Continuous    Liters per minute 2    Home Resting Oxygen Prescription Continuous    Liters per minute 2    Compliance with Home Oxygen Use Yes      Initial 6 min Walk   Oxygen Used Pulsed    Liters per minute 2      Program Oxygen Prescription   Program Oxygen Prescription Continuous    Liters per minute 2      Intervention   Short Term Goals To learn and exhibit compliance with exercise, home and travel O2 prescription;To learn and understand importance of monitoring SPO2 with pulse oximeter and demonstrate accurate use of the pulse oximeter.;To learn and understand importance of maintaining oxygen saturations>88%;To learn and demonstrate proper pursed lip breathing techniques or other breathing techniques. ;To learn and demonstrate proper use of respiratory medications    Long  Term Goals Exhibits compliance with exercise, home  and travel O2 prescription;Verbalizes  importance of monitoring SPO2 with pulse oximeter and return demonstration;Maintenance of O2 saturations>88%;Exhibits proper breathing techniques, such as pursed lip breathing or other method taught during program session;Compliance with respiratory medication;Demonstrates proper use of MDI's             Oxygen Re-Evaluation:  Oxygen Re-Evaluation     Row Name 02/18/23 0815 02/25/23 0741           Program Oxygen Prescription   Program Oxygen Prescription Continuous Continuous      Liters per minute 2 2        Home Oxygen   Home Oxygen Device Portable Concentrator;Home Concentrator Portable Concentrator;Home Concentrator      Sleep Oxygen Prescription Continuous;CPAP Continuous;CPAP      Liters per minute 2 2      Home Exercise Oxygen Prescription Continuous Continuous      Liters per minute 2 2      Home Resting Oxygen Prescription Continuous Continuous      Liters per minute 2 2      Compliance with Home Oxygen Use Yes Yes        Goals/Expected Outcomes   Short Term Goals To learn and demonstrate proper pursed lip breathing techniques or other breathing techniques.  To learn and demonstrate proper pursed lip breathing techniques or other breathing techniques. ;To learn and exhibit compliance with exercise, home and travel O2 prescription;To learn and understand importance of monitoring SPO2 with pulse oximeter and demonstrate accurate use of the pulse oximeter.;To learn and understand importance of maintaining oxygen saturations>88%;To learn and demonstrate proper use of respiratory medications      Long  Term Goals Exhibits proper breathing techniques, such as pursed lip breathing or other method taught during program session Exhibits compliance with exercise, home  and travel O2 prescription;Verbalizes importance of monitoring SPO2 with pulse oximeter and return demonstration;Maintenance of O2 saturations>88%;Exhibits proper breathing techniques, such as pursed lip breathing or  other method taught during program session;Compliance with respiratory medication      Comments Reviewed PLB technique with pt.  Talked about how it works and it's importance in maintaining their exercise saturations. Patient reports compliance with home oxygen use and respiratory medications. He does have a pulse ox at home and consistently monitors SaO2 levels and reports that they ususally stay above 88%. He reports that if he gets SOB or notices decreases in SaO2 levels he feels comfortable using PLB.      Goals/Expected Outcomes Short: Become more profiecient at using PLB. Long: Become independent at using PLB. Short: continue to  attend pulmonary rehab consistently to work on strength and lung function. Long: continue to use PLB independently               Oxygen Discharge (Final Oxygen Re-Evaluation):  Oxygen Re-Evaluation - 02/25/23 0741       Program Oxygen Prescription   Program Oxygen Prescription Continuous    Liters per minute 2      Home Oxygen   Home Oxygen Device Portable Concentrator;Home Concentrator    Sleep Oxygen Prescription Continuous;CPAP    Liters per minute 2    Home Exercise Oxygen Prescription Continuous    Liters per minute 2    Home Resting Oxygen Prescription Continuous    Liters per minute 2    Compliance with Home Oxygen Use Yes      Goals/Expected Outcomes   Short Term Goals To learn and demonstrate proper pursed lip breathing techniques or other breathing techniques. ;To learn and exhibit compliance with exercise, home and travel O2 prescription;To learn and understand importance of monitoring SPO2 with pulse oximeter and demonstrate accurate use of the pulse oximeter.;To learn and understand importance of maintaining oxygen saturations>88%;To learn and demonstrate proper use of respiratory medications    Long  Term Goals Exhibits compliance with exercise, home  and travel O2 prescription;Verbalizes importance of monitoring SPO2 with pulse oximeter and  return demonstration;Maintenance of O2 saturations>88%;Exhibits proper breathing techniques, such as pursed lip breathing or other method taught during program session;Compliance with respiratory medication    Comments Patient reports compliance with home oxygen use and respiratory medications. He does have a pulse ox at home and consistently monitors SaO2 levels and reports that they ususally stay above 88%. He reports that if he gets SOB or notices decreases in SaO2 levels he feels comfortable using PLB.    Goals/Expected Outcomes Short: continue to attend pulmonary rehab consistently to work on strength and lung function. Long: continue to use PLB independently             Initial Exercise Prescription:  Initial Exercise Prescription - 12/05/22 1600       Date of Initial Exercise RX and Referring Provider   Date 12/05/22      Oxygen   Oxygen Continuous    Liters 2    Maintain Oxygen Saturation 88% or higher      NuStep   Level 1    SPM 80    Minutes 15    METs 2      REL-XR   Level 1    Speed 50    Minutes 15    METs 2      Biostep-RELP   Level 1    SPM 50    Minutes 15    METs 2      Track   Laps 20    Minutes 15    METs 2.09      Prescription Details   Frequency (times per week) 3    Duration Progress to 30 minutes of continuous aerobic without signs/symptoms of physical distress      Intensity   THRR 40-80% of Max Heartrate 122- 133    Ratings of Perceived Exertion 11-13    Perceived Dyspnea 0-4      Progression   Progression Continue to progress workloads to maintain intensity without signs/symptoms of physical distress.      Resistance Training   Training Prescription Yes    Weight 3 lb    Reps 10-15  Perform Capillary Blood Glucose checks as needed.  Exercise Prescription Changes:   Exercise Prescription Changes     Row Name 12/05/22 1600 03/05/23 1000 03/19/23 1300 04/01/23 1400 04/03/23 0900     Response to Exercise    Blood Pressure (Admit) 112/56 110/60 112/60 104/60 --   Blood Pressure (Exercise) 124/60 142/66 140/70 142/60 --   Blood Pressure (Exit) 106/60 106/62 110/62 108/58 --   Heart Rate (Admit) 114 bpm 91 bpm 108 bpm 110 bpm --   Heart Rate (Exercise) 125 bpm 122 bpm 124 bpm 127 bpm --   Heart Rate (Exit) 111 bpm 114 bpm 116 bpm 123 bpm --   Oxygen Saturation (Admit) 94 % 95 % 90 % 95 % --   Oxygen Saturation (Exercise) 87 % 96 % 89 % 91 % --   Oxygen Saturation (Exit) 96 % 94 % 95 % 96 % --   Rating of Perceived Exertion (Exercise) 11 15 13 15  --   Perceived Dyspnea (Exercise) 1 2 2  0 --   Symptoms SOB -- SOB SOB --   Comments walk test results -- -- -- --   Duration -- -- -- Progress to 30 minutes of  aerobic without signs/symptoms of physical distress --   Intensity -- -- -- THRR unchanged --     Progression   Progression -- -- -- Continue to progress workloads to maintain intensity without signs/symptoms of physical distress. --   Average METs -- -- 1.8 2.01 --     Resistance Training   Training Prescription -- Yes Yes Yes --   Weight -- 4 lb 4 lb 4 lb --   Reps -- 10-15 10-15 10-15 --     Interval Training   Interval Training -- -- -- No --     Oxygen   Oxygen -- Continuous Continuous Continuous --   Liters -- 2 2 2  --     Recumbant Bike   Level -- 2 2 -- --   Watts -- 18 18 -- --   Minutes -- 15 15 -- --   METs -- 2.68 2.69 -- --     NuStep   Level -- 3 -- -- --   Minutes -- 15 -- -- --   METs -- 2.1 -- -- --     Biostep-RELP   Level -- 1 2 3  --   SPM -- -- 50 -- --   Minutes -- 15 15 15  --   METs -- 2 2 2  --     Track   Laps -- 19 25 19  --   Minutes -- 15 15 15  --   METs -- 2.03 2.36 2.03 --     Home Exercise Plan   Plans to continue exercise at -- -- -- -- Home (comment)  Walking at home around [facility] or at [employer] -- -- -- -- Add 3 additional days to program exercise sessions.   Initial Home Exercises Provided -- -- -- -- 04/03/23      Oxygen   Maintain Oxygen Saturation -- 88% or higher 88% or higher 88% or higher 88% or higher            Exercise Comments:   Exercise Comments     Row Name 02/18/23 0814           Exercise Comments First full day of exercise!  Patient was oriented to gym and equipment including functions, settings, policies, and procedures.  Patient's individual exercise prescription  and treatment plan were reviewed.  All starting workloads were established based on the results of the 6 minute walk test done at initial orientation visit.  The plan for exercise progression was also introduced and progression will be customized based on patient's performance and goals.                Exercise Goals and Review:   Exercise Goals     Row Name 12/05/22 1632             Exercise Goals   Increase Physical Activity Yes       Intervention Provide advice, education, support and counseling about physical activity/exercise needs.;Develop an individualized exercise prescription for aerobic and resistive training based on initial evaluation findings, risk stratification, comorbidities and participant's personal goals.       Expected Outcomes Short Term: Attend rehab on a regular basis to increase amount of physical activity.;Long Term: Add in home exercise to make exercise part of routine and to increase amount of physical activity.;Long Term: Exercising regularly at least 3-5 days a week.       Increase Strength and Stamina Yes       Intervention Provide advice, education, support and counseling about physical activity/exercise needs.;Develop an individualized exercise prescription for aerobic and resistive training based on initial evaluation findings, risk stratification, comorbidities and participant's personal goals.       Expected Outcomes Short Term: Increase workloads from initial exercise prescription for resistance, speed, and METs.;Short Term: Perform resistance training exercises routinely  during rehab and add in resistance training at home;Long Term: Improve cardiorespiratory fitness, muscular endurance and strength as measured by increased METs and functional capacity ( )       Able to understand and use rate of perceived exertion (RPE) scale Yes       Intervention Provide education and explanation on how to use RPE scale       Expected Outcomes Short Term: Able to use RPE daily in rehab to express subjective intensity level;Long Term:  Able to use RPE to guide intensity level when exercising independently       Able to understand and use Dyspnea scale Yes       Intervention Provide education and explanation on how to use Dyspnea scale       Expected Outcomes Short Term: Able to use Dyspnea scale daily in rehab to express subjective sense of shortness of breath during exertion;Long Term: Able to use Dyspnea scale to guide intensity level when exercising independently       Knowledge and understanding of Target Heart Rate Range (THRR) Yes       Intervention Provide education and explanation of THRR including how the numbers were predicted and where they are located for reference       Expected Outcomes Short Term: Able to state/look up THRR;Long Term: Able to use THRR to govern intensity when exercising independently;Short Term: Able to use daily as guideline for intensity in rehab       Able to check pulse independently Yes       Intervention Provide education and demonstration on how to check pulse in carotid and radial arteries.;Review the importance of being able to check your own pulse for safety during independent exercise       Expected Outcomes Long Term: Able to check pulse independently and accurately;Short Term: Able to explain why pulse checking is important during independent exercise       Understanding of Exercise Prescription Yes  Intervention Provide education, explanation, and written materials on patient's individual exercise prescription       Expected  Outcomes Short Term: Able to explain program exercise prescription;Long Term: Able to explain home exercise prescription to exercise independently                Exercise Goals Re-Evaluation :  Exercise Goals Re-Evaluation     Row Name 12/24/22 1453 02/18/23 0814 02/25/23 0737 03/05/23 1011 03/19/23 1332     Exercise Goal Re-Evaluation   Exercise Goals Review Increase Physical Activity;Understanding of Exercise Prescription;Increase Strength and Stamina Able to understand and use rate of perceived exertion (RPE) scale;Able to understand and use Dyspnea scale;Understanding of Exercise Prescription Increase Physical Activity;Increase Strength and Stamina Increase Physical Activity;Understanding of Exercise Prescription;Increase Strength and Stamina Increase Physical Activity;Increase Strength and Stamina;Understanding of Exercise Prescription   Comments Patient did not complete their first day of exercise yet. Patient had an elevated resting HR at his orientation and per Dr. Karna Christmas, had to get a holter ordered to rule out any abnormalities. Patient needs to follow up with the cardiologist and get clearance from them and Dr. Mervyn Skeeters before starting rehab back up. Reviewed RPE  and dyspnea scale, THR and program prescription with pt today.  Pt voiced understanding and was given a copy of goals to take home. After Bill's orientation he was out for an extended period of time due to health issues and hospitalization. He has now resently started the program and is attending consistently. He is tolerating exercise well and getting established in his exercise routine. Annette Stable has started his exercise sessions. He has increased his Nustep T4 level to 3 and his handweight to 4 LBs. His RPE is 12 (one at 15 on the Rec Bike) . Will contiue to encourage him to increase workloads as tolerated and  tomonitor his exericse progression. Annette Stable is doing well in rehab. He increased his track laps to 25. He also continues to do  well with the 4 lb weights. We will continue to monitor his progress in the program.   Expected Outcomes Short: Get clearance to rehab Long: Build up overall strength and stamina Short: Use RPE daily to regulate intensity. Long: Follow program prescription in THR. Short: attend pulmonary rehab consistently and continue to get estalished in exercise routine. Long: become independent with exercise routine. WUJ:[id number] workloads as tolerated with goal of increased stamina and strength.   LTG: Continued exercise progression as tolerated during program and after discharge. Short: Continue to follow current exercise prescription, and progressively increase workloads. Long: Continue exercise to improve strength and stamina.    Row Name 04/01/23 1450 04/03/23 0845           Exercise Goal Re-Evaluation   Exercise Goals Review Increase Physical Activity;Increase Strength and Stamina;Understanding of Exercise Prescription Increase Physical Activity;Increase Strength and Stamina;Understanding of Exercise Prescription      Comments Annette Stable is doing well in rehab. He has only attended one session since the last review. He continues to work at level 4 on [employer] and walk the track. We will continue to monitor his progress in the program upon return. Reviewed home exercise with pt today.  Pt plans to walk every morning around the mall or around [facility] for exercise. Reviewed THR, pulse, RPE, sign and symptoms, pulse oximetery and when to call 911 or MD.  Also discussed weather considerations and indoor options.  Pt voiced understanding.      Expected Outcomes Short: Return to the program once  cleared and able. Long: Continue exercise to improve strength and stamina. Short: Continue exercise outside of the program. Long: Continue exercise to improve strength and stamina.               Discharge Exercise Prescription (Final Exercise Prescription Changes):  Exercise Prescription Changes - 04/03/23 0900        Home Exercise Plan   Plans to continue exercise at Home (comment)   Walking at home around [facility] or at the mall   Frequency Add 3 additional days to program exercise sessions.    Initial Home Exercises Provided 04/03/23      Oxygen   Maintain Oxygen Saturation 88% or higher             Nutrition:  Target Goals: Understanding of nutrition guidelines, daily intake of sodium 1500mg , cholesterol 200mg , calories 30% from fat and 7% or less from saturated fats, daily to have 5 or more servings of fruits and vegetables.  Education: All About Nutrition: -Group instruction provided by verbal, written material, interactive activities, discussions, models, and posters to present general guidelines for heart healthy nutrition including fat, fiber, MyPlate, the role of sodium in heart healthy nutrition, utilization of the nutrition label, and utilization of this knowledge for meal planning. Follow up email sent as well. Written material given at graduation. Flowsheet Row Pulmonary Rehab from 12/05/2022 in [facility] Cardiac and Pulmonary Rehab  Education need identified 12/05/22       Biometrics:  Pre Biometrics - 12/05/22 1630       Pre Biometrics   Height 5' 11.5" (1.816 m)    Weight 180 lb 8 oz (81.9 kg)    Waist Circumference --   declined   Hip Circumference --   declined   BMI (Calculated) 24.83    Single Leg Stand 5.5 seconds              Nutrition Therapy Plan and Nutrition Goals:  Nutrition Therapy & Goals - 12/05/22 1619       Intervention Plan   Intervention Prescribe, educate and counsel regarding individualized specific dietary modifications aiming towards targeted core components such as weight, hypertension, lipid management, diabetes, heart failure and other comorbidities.    Expected Outcomes Short Term Goal: Understand basic principles of dietary content, such as calories, fat, sodium, cholesterol and nutrients.;Short Term Goal: A plan has been  developed with personal nutrition goals set during dietitian appointment.;Long Term Goal: Adherence to prescribed nutrition plan.             Nutrition Assessments:  MEDIFICTS Score Key: ?70 Need to make dietary changes  40-70 Heart Healthy Diet ? 40 Therapeutic Level Cholesterol Diet  Flowsheet Row Pulmonary Rehab from 12/05/2022 in [facility] Cardiac and Pulmonary Rehab  Picture Your Plate Total Score on Admission 81      Picture Your Plate Scores: <46 Unhealthy dietary pattern with much room for improvement. 41-50 Dietary pattern unlikely to meet recommendations for good health and room for improvement. 51-60 More healthful dietary pattern, with some room for improvement.  >60 Healthy dietary pattern, although there may be some specific behaviors that could be improved.   Nutrition Goals Re-Evaluation:  Nutrition Goals Re-Evaluation     Row Name 02/25/23 0747             Goals   Comment Patient decided to cancel RD apt and defer meeting for now. He plans to attend the nutrition group education class in the future.  Nutrition Goals Discharge (Final Nutrition Goals Re-Evaluation):  Nutrition Goals Re-Evaluation - 02/25/23 0747       Goals   Comment Patient decided to cancel RD apt and defer meeting for now. He plans to attend the nutrition group education class in the future.             Psychosocial: Target Goals: Acknowledge presence or absence of significant depression and/or stress, maximize coping skills, provide positive support system. Participant is able to verbalize types and ability to use techniques and skills needed for reducing stress and depression.   Education: Stress, Anxiety, and Depression - Group verbal and visual presentation to define topics covered.  Reviews how body is impacted by stress, anxiety, and depression.  Also discusses healthy ways to reduce stress and to treat/manage anxiety and depression.  Written material given  at graduation.   Education: Sleep Hygiene -Provides group verbal and written instruction about how sleep can affect your health.  Define sleep hygiene, discuss sleep cycles and impact of sleep habits. Review good sleep hygiene tips.    Initial Review & Psychosocial Screening:  Initial Psych Review & Screening - 12/03/22 1033       Initial Review   Current issues with None Identified      Family Dynamics   Good Support System? Yes   wife, family     Barriers   Psychosocial barriers to participate in program There are no identifiable barriers or psychosocial needs.      Screening Interventions   Interventions Provide feedback about the scores to participant;Encouraged to exercise;To provide support and resources with identified psychosocial needs    Expected Outcomes Short Term goal: Utilizing psychosocial counselor, staff and physician to assist with identification of specific Stressors or current issues interfering with healing process. Setting desired goal for each stressor or current issue identified.;Long Term Goal: Stressors or current issues are controlled or eliminated.;Short Term goal: Identification and review with participant of any Quality of Life or Depression concerns found by scoring the questionnaire.;Long Term goal: The participant improves quality of Life and PHQ9 Scores as seen by post scores and/or verbalization of changes             Quality of Life Scores:  Scores of 19 and below usually indicate a poorer quality of life in these areas.  A difference of  2-3 points is a clinically meaningful difference.  A difference of 2-3 points in the total score of the Quality of Life Index has been associated with significant improvement in overall quality of life, self-image, physical symptoms, and general health in studies assessing change in quality of life.  PHQ-9: Review Flowsheet  More data exists      12/05/2022 07/01/2022 05/01/2022 07/01/2019 06/24/2018   Depression screen PHQ 2/9  Decreased Interest 0 0 0 0 0  Down, Depressed, Hopeless 0 0 0 0 1  PHQ - 2 Score 0 0 0 0 1  Altered sleeping 0 0 0 - -  Tired, decreased energy 1 0 0 - -  Change in appetite 0 0 0 - -  Feeling bad or failure about yourself  0 0 0 - -  Trouble concentrating 0 0 0 - -  Moving slowly or fidgety/restless 0 0 0 - -  Suicidal thoughts 0 0 0 - -  PHQ-9 Score 1 0 0 - -  Difficult doing work/chores Not difficult at all Not difficult at all Not difficult at all - -    Details  Interpretation of Total Score  Total Score Depression Severity:  1-4 = Minimal depression, 5-9 = Mild depression, 10-14 = Moderate depression, 15-19 = Moderately severe depression, 20-27 = Severe depression   Psychosocial Evaluation and Intervention:  Psychosocial Evaluation - 12/03/22 1033       Psychosocial Evaluation & Interventions   Comments 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 He has been on oxygen for 12 years and feels comfortable managing it. After his most recent hospitalization, PT came to his house to show him some things. However, he and his wife admit that it wasn't too much to go off of and mainly he doesn't feel motivated to do the exercises alone. So they are both ready for him to get started in the program. He likes people and being in a group setting. He and his wife manage his health care needs and he states he feels like he is in a good spot mentally. He and his wife joked that the biggest stressor in his life right now is that she won't let him have the dessert he wants every night. He is motivated to attend the program    Expected Outcomes Short: attend pulmonary rehab for education and exercise. Long: develop and maintain positive self care habits.    Continue Psychosocial Services  Follow up required by staff             Psychosocial Re-Evaluation:  Psychosocial Re-Evaluation     Row Name 02/25/23 [phone]             Psychosocial  Re-Evaluation   Current issues with None Identified       Comments Patient reports no new concerns or changes in sleep, stress, or mental health. He has recently started the program after a time gap between orientation and first day of class due to health concerns. He reports he is enjoying coming to class.       Expected Outcomes Short: continue to attend pulmonary rehab consistenly for mental health benefit. Long: maintain good mental health habits.       Interventions Encouraged to attend Pulmonary Rehabilitation for the exercise       Continue Psychosocial Services  Follow up required by staff                Psychosocial Discharge (Final Psychosocial Re-Evaluation):  Psychosocial Re-Evaluation - 02/25/23 0752       Psychosocial Re-Evaluation   Current issues with None Identified    Comments Patient reports no new concerns or changes in sleep, stress, or mental health. He has recently started the program after a time gap between orientation and first day of class due to health concerns. He reports he is enjoying coming to class.    Expected Outcomes Short: continue to attend pulmonary rehab consistenly for mental health benefit. Long: maintain good mental health habits.    Interventions Encouraged to attend Pulmonary Rehabilitation for the exercise    Continue Psychosocial Services  Follow up required by staff             Education: Education Goals: Education classes will be provided on a weekly basis, covering required topics. Participant will state understanding/return demonstration of topics presented.  Learning Barriers/Preferences:  Learning Barriers/Preferences - 12/03/22 1014       Learning Barriers/Preferences   Learning Barriers Hearing    Learning Preferences Individual Instruction             General Pulmonary Education Topics:  Infection Prevention: - Provides verbal  and written material to individual with discussion of infection control including  proper hand washing and proper equipment cleaning during exercise session. Flowsheet Row Pulmonary Rehab from 12/05/2022 in [facility] Cardiac and Pulmonary Rehab  Education need identified 12/05/22  Date 12/05/22  Educator KW  Instruction Review Code 1- Verbalizes Understanding       Falls Prevention: - Provides verbal and written material to individual with discussion of falls prevention and safety. Flowsheet Row Pulmonary Rehab from 12/05/2022 in [facility] Cardiac and Pulmonary Rehab  Education need identified 12/05/22  Date 12/05/22  Educator KW  Instruction Review Code 1- Verbalizes Understanding       Chronic Lung Disease Review: - Group verbal instruction with posters, models, PowerPoint presentations and videos,  to review new updates, new respiratory medications, new advancements in procedures and treatments. Providing information on websites and "800" numbers for continued self-education. Includes information about supplement oxygen, available portable oxygen systems, continuous and intermittent flow rates, oxygen safety, concentrators, and Medicare reimbursement for oxygen. Explanation of Pulmonary Drugs, including class, frequency, complications, importance of spacers, rinsing mouth after steroid MDI's, and proper cleaning methods for nebulizers. Review of basic lung anatomy and physiology related to function, structure, and complications of lung disease. Review of risk factors. Discussion about methods for diagnosing sleep apnea and types of masks and machines for OSA. Includes a review of the use of types of environmental controls: home humidity, furnaces, filters, dust mite/pet prevention, HEPA vacuums. Discussion about weather changes, air quality and the benefits of nasal washing. Instruction on Warning signs, infection symptoms, calling MD promptly, preventive modes, and value of vaccinations. Review of effective airway clearance, coughing and/or vibration techniques. Emphasizing that all  should Create an Action Plan. Written material given at graduation. Flowsheet Row Pulmonary Rehab from 12/05/2022 in [facility] Cardiac and Pulmonary Rehab  Education need identified 12/05/22       AED/CPR: - Group verbal and written instruction with the use of models to demonstrate the basic use of the AED with the basic ABC's of resuscitation.    Anatomy and Cardiac Procedures: - Group verbal and visual presentation and models provide information about basic cardiac anatomy and function. Reviews the testing methods done to diagnose heart disease and the outcomes of the test results. Describes the treatment choices: Medical Management, Angioplasty, or Coronary Bypass Surgery for treating various heart conditions including Myocardial Infarction, Angina, Valve Disease, and Cardiac Arrhythmias.  Written material given at graduation.   Medication Safety: - Group verbal and visual instruction to review commonly prescribed medications for heart and lung disease. Reviews the medication, class of the drug, and side effects. Includes the steps to properly store meds and maintain the prescription regimen.  Written material given at graduation.   Other: -Provides group and verbal instruction on various topics (see comments)   Knowledge Questionnaire Score:  Knowledge Questionnaire Score - 12/05/22 1617       Knowledge Questionnaire Score   Pre Score 16/18              Core Components/Risk Factors/Patient Goals at Admission:  Personal Goals and Risk Factors at Admission - 12/05/22 1633       Core Components/Risk Factors/Patient Goals on Admission    Weight Management Yes;Weight Maintenance   Already lost 14 lbs since hospital visit   Intervention Weight Management: Develop a combined nutrition and exercise program designed to reach desired caloric intake, while maintaining appropriate intake of nutrient and fiber, sodium and fats, and appropriate energy expenditure required for the weight  goal.;Weight Management: Provide education and appropriate resources to help participant work on and attain dietary goals.;Weight Management/Obesity: Establish reasonable short term and long term weight goals.    Admit Weight 180 lb (81.6 kg)    Goal Weight: Short Term 180 lb (81.6 kg)    Goal Weight: Long Term 180 lb (81.6 kg)    Expected Outcomes Short Term: Continue to assess and modify interventions until short term weight is achieved;Long Term: Adherence to nutrition and physical activity/exercise program aimed toward attainment of established weight goal;Weight Maintenance: Understanding of the daily nutrition guidelines, which includes 25-35% calories from fat, 7% or less cal from saturated fats, less than 200mg  cholesterol, less than 1.5gm of sodium, & 5 or more servings of fruits and vegetables daily;Understanding recommendations for meals to include 15-35% energy as protein, 25-35% energy from fat, 35-60% energy from carbohydrates, less than 200mg  of dietary cholesterol, 20-35 gm of total fiber daily;Understanding of distribution of calorie intake throughout the day with the consumption of 4-5 meals/snacks    Improve shortness of breath with ADL's Yes    Intervention Provide education, individualized exercise plan and daily activity instruction to help decrease symptoms of SOB with activities of daily living.    Expected Outcomes Short Term: Improve cardiorespiratory fitness to achieve a reduction of symptoms when performing ADLs;Long Term: Be able to perform more ADLs without symptoms or delay the onset of symptoms    Hypertension Yes    Intervention Provide education on lifestyle modifcations including regular physical activity/exercise, weight management, moderate sodium restriction and increased consumption of fresh fruit, vegetables, and low fat dairy, alcohol moderation, and smoking cessation.;Monitor prescription use compliance.    Expected Outcomes Short Term: Continued assessment and  intervention until BP is < 140/65mm HG in hypertensive participants. < 130/46mm HG in hypertensive participants with diabetes, heart failure or chronic kidney disease.;Long Term: Maintenance of blood pressure at goal levels.    Lipids Yes    Intervention Provide education and support for participant on nutrition & aerobic/resistive exercise along with prescribed medications to achieve LDL 70mg , HDL >40mg .    Expected Outcomes Short Term: Participant states understanding of desired cholesterol values and is compliant with medications prescribed. Participant is following exercise prescription and nutrition guidelines.;Long Term: Cholesterol controlled with medications as prescribed, with individualized exercise RX and with personalized nutrition plan. Value goals: LDL < 70mg , HDL > 40 mg.             Education:Diabetes - Individual verbal and written instruction to review signs/symptoms of diabetes, desired ranges of glucose level fasting, after meals and with exercise. Acknowledge that pre and post exercise glucose checks will be done for 3 sessions at entry of program.   Know Your Numbers and Heart Failure: - Group verbal and visual instruction to discuss disease risk factors for cardiac and pulmonary disease and treatment options.  Reviews associated critical values for Overweight/Obesity, Hypertension, Cholesterol, and Diabetes.  Discusses basics of heart failure: signs/symptoms and treatments.  Introduces Heart Failure Zone chart for action plan for heart failure.  Written material given at graduation.   Core Components/Risk Factors/Patient Goals Review:   Goals and Risk Factor Review     Row Name 02/25/23 0757             Core Components/Risk Factors/Patient Goals Review   Personal Goals Review Hypertension;Lipids       Review Patient reports that he consistently takes all BP and cholesterol meds and has a good routine with his medications and is  currently not having any problems  with meds. He checks his BP every day at home 2 times a day. He just got a new BP machine.       Expected Outcomes Short: continue to monitor BP at home and take all meds as prescribed. Long: continue to control risk factors with meds and healthy lifestyle.                Core Components/Risk Factors/Patient Goals at Discharge (Final Review):   Goals and Risk Factor Review - 02/25/23 0757       Core Components/Risk Factors/Patient Goals Review   Personal Goals Review Hypertension;Lipids    Review Patient reports that he consistently takes all BP and cholesterol meds and has a good routine with his medications and is currently not having any problems with meds. He checks his BP every day at home 2 times a day. He just got a new BP machine.    Expected Outcomes Short: continue to monitor BP at home and take all meds as prescribed. Long: continue to control risk factors with meds and healthy lifestyle.             ITP Comments:  ITP Comments     Row Name 12/03/22 1037 12/05/22 1612 12/09/22 1242 12/19/22 1117 12/25/22 0759   ITP Comments Initial phone call completed. Diagnosis can be found in [facility] 4/11. EP Orientation scheduled for Thursday 4/25 at 2pm. Completed and gym orientation. Initial ITP created and sent for review to Dr. Vida Rigger, Medical Director. Per Dr. Karna Christmas, pause pulmonary rehab appointments- would like to order a holter monitor and be evaluated and cleared to go from a cardiac stand point until he returns. Pt was tachycardic at orient. Left message for patient and wife to call back to discuss appts. They saw Dr. Roberts Gaudy nurse on 4/26. Called and spoke with patient's wife- patient is due to see a cardiologist on 6/3 to follow up on holter monitor results. Patient had a holter orderd per Dr. Karna Christmas after he was notified that patient had elevated resting HR at pulmonary rehab orientation. Patient needs clearance from cardiologist and Dr. Karna Christmas before starting  pulmonary rehab. Wife and patient aware they need written clearance from both MDs. Wife and patient will call us after cardiology appt to keep [place] updated. Placing patient on medical hold at this time before starting rehab. 30 Day review completed. Medical Director ITP review done, changes made as directed, and signed approval by Medical Director.   remains on medical hold    Row Name 01/16/23 1417 01/22/23 0725 02/03/23 1618 02/18/23 0814 02/18/23 1356   ITP Comments Called pt to follow up from MD appt for clearance to start rehab.  Note show no findings and needing rehab.  Left message for patient. Pt has not yet started rehab. 30 Day review completed. Medical Director ITP review done, changes made as directed, and signed approval by Medical Director.   waiting to start after medical clearance Called and spoke with pt. He has been cleared by cardiology to begin pulmonary rehab. Pt will start rehab 02/18/23 at 7:15 am. First full day of exercise!  Patient was oriented to gym and equipment including functions, settings, policies, and procedures.  Patient's individual exercise prescription and treatment plan were reviewed.  All starting workloads were established based on the results of the 6 minute walk test done at initial orientation visit.  The plan for exercise progression was also introduced and progression will be customized based on  patient's performance and goals. 30 Day review completed. Medical Director ITP review done, changes made as directed, and signed approval by Medical Director.   new to program    Row Name 03/19/23 1118 04/16/23 0929         ITP Comments 30 Day review completed. Medical Director ITP review done, changes made as directed, and signed approval by Medical Director. 30 Day review completed. Medical Director ITP review done, changes made as directed, and signed approval by Medical Director.               Comments:

## 2023-04-22 ENCOUNTER — Encounter: Payer: Medicare HMO | Admitting: *Deleted

## 2023-04-22 DIAGNOSIS — Z87891 Personal history of nicotine dependence: Secondary | ICD-10-CM | POA: Diagnosis not present

## 2023-04-22 DIAGNOSIS — J449 Chronic obstructive pulmonary disease, unspecified: Secondary | ICD-10-CM

## 2023-04-22 NOTE — Progress Notes (Signed)
Daily Session Note  Patient Details  Name: Gregory Haynes MRN: [id number] Date of Birth: 1941-02-28 Referring Provider:    Encounter Date: 04/22/2023  Check In:  Session Check In - 04/22/23 0916       Check-In   Supervising physician immediately available to respond to emergencies See telemetry face sheet for immediately available ER MD    Location [facility]-Cardiac & Pulmonary Rehab    Staff Present Cora Collum, RN, BSN, CCRP;Maxon Conetta BS, , Exercise Physiologist;Noah Tickle, BS, Exercise Physiologist    Virtual Visit No    Medication changes reported     No    Fall or balance concerns reported    No    Warm-up and Cool-down Performed on first and last piece of equipment    Resistance Training Performed Yes    VAD Patient? No    PAD/SET Patient? No      Pain Assessment   Currently in Pain? No/denies                Social History   Tobacco Use  Smoking Status Former   Current packs/day: 0.00   Average packs/day: 1.5 packs/day for 55.0 years (82.5 ttl pk-yrs)   Types: Cigarettes   Start date: 08/12/1956   Quit date: 08/13/2011   Years since quitting: 11.6  Smokeless Tobacco Never    Goals Met:  Independence with exercise equipment Exercise tolerated well No report of concerns or symptoms today  Goals Unmet:  Not Applicable  Comments: Pt able to follow exercise prescription today without complaint.  Will continue to monitor for progression.    Dr. Bethann Punches is Medical Director for [facility] Cardiac Rehabilitation.  Dr. Vida Rigger is Medical Director for [facility] Pulmonary Rehabilitation.

## 2023-04-24 ENCOUNTER — Encounter: Payer: Medicare HMO | Admitting: *Deleted

## 2023-04-24 DIAGNOSIS — J449 Chronic obstructive pulmonary disease, unspecified: Secondary | ICD-10-CM

## 2023-04-24 DIAGNOSIS — Z87891 Personal history of nicotine dependence: Secondary | ICD-10-CM | POA: Diagnosis not present

## 2023-04-24 NOTE — Progress Notes (Signed)
Daily Session Note  Patient Details  Name: Gregory Haynes MRN: [id number] Date of Birth: 1940/09/07 Referring Provider:    Encounter Date: 04/24/2023  Check In:  Session Check In - 04/24/23 1031       Check-In   Supervising physician immediately available to respond to emergencies See telemetry face sheet for immediately available ER MD    Location [facility]    Staff Present Maxon Conetta BS, , Exercise Physiologist;Megan Katrinka Blazing, RN, Silas Flood, BS, Exercise Physiologist    Virtual Visit No    Medication changes reported     No    Fall or balance concerns reported    No    Warm-up and Cool-down Performed on first and last piece of equipment    Resistance Training Performed Yes    VAD Patient? No    PAD/SET Patient? No      Pain Assessment   Currently in Pain? No/denies                Social History   Tobacco Use  Smoking Status Former   Current packs/day: 0.00   Average packs/day: 1.5 packs/day for 55.0 years (82.5 ttl pk-yrs)   Types: Cigarettes   Start date: 08/12/1956   Quit date: 08/13/2011   Years since quitting: 11.7  Smokeless Tobacco Never    Goals Met:  Independence with exercise equipment Exercise tolerated well No report of concerns or symptoms today Strength training completed today  Goals Unmet:  Not Applicable  Comments: Pt able to follow exercise prescription today without complaint.  Will continue to monitor for progression.    Dr. Bethann Punches is Medical Director for [facility] Card[facility] Rehabilitation.  Dr. Vida Rigger is Medical Director for [facility] Pulmonary Rehabilitation.

## 2023-04-29 ENCOUNTER — Encounter: Payer: Medicare HMO | Admitting: *Deleted

## 2023-04-29 DIAGNOSIS — J449 Chronic obstructive pulmonary disease, unspecified: Secondary | ICD-10-CM

## 2023-04-29 DIAGNOSIS — Z87891 Personal history of nicotine dependence: Secondary | ICD-10-CM | POA: Diagnosis not present

## 2023-04-29 NOTE — Progress Notes (Signed)
Daily Session Note  Patient Details  Name: Gregory Haynes MRN: [id number] Date of Birth: 05/03/1941 Referring Provider:    Encounter Date: 04/29/2023  Check In:  Session Check In - 04/29/23 0838       Check-In   Supervising physician immediately available to respond to emergencies See telemetry face sheet for immediately available ER MD    Location [facility]    Staff Present Cora Collum, RN, BSN, CCRP;Noah Tickle, BS, Exercise Physiologist;Maxon Conetta BS, , Exercise Physiologist    Virtual Visit No    Medication changes reported     No    Fall or balance concerns reported    No    Warm-up and Cool-down Performed on first and last piece of equipment    Resistance Training Performed Yes    VAD Patient? No    PAD/SET Patient? No      Pain Assessment   Currently in Pain? No/denies                Social History   Tobacco Use  Smoking Status Former   Current packs/day: 0.00   Average packs/day: 1.5 packs/day for 55.0 years (82.5 ttl pk-yrs)   Types: Cigarettes   Start date: 08/12/1956   Quit date: 08/13/2011   Years since quitting: 11.7  Smokeless Tobacco Never    Goals Met:  Independence with exercise equipment Exercise tolerated well No report of concerns or symptoms today  Goals Unmet:  Not Applicable  Comments: Pt able to follow exercise prescription today without complaint.  Will continue to monitor for progression.    Dr. Bethann Punches is Medical Director for [facility] Cardiac Rehabilitation.  Dr. Vida Rigger is Medical Director for [facility] Pulmonary Rehabilitation.

## 2023-05-01 ENCOUNTER — Encounter: Payer: Medicare HMO | Admitting: *Deleted

## 2023-05-01 DIAGNOSIS — J449 Chronic obstructive pulmonary disease, unspecified: Secondary | ICD-10-CM | POA: Diagnosis not present

## 2023-05-01 DIAGNOSIS — Z87891 Personal history of nicotine dependence: Secondary | ICD-10-CM | POA: Diagnosis not present

## 2023-05-01 NOTE — Progress Notes (Signed)
Daily Session Note  Patient Details  Name: Gregory Haynes MRN: [id number] Date of Birth: 02/01/1941 Referring Provider:    Encounter Date: 05/01/2023  Check In:  Session Check In - 05/01/23 0810       Check-In   Supervising physician immediately available to respond to emergencies See telemetry face sheet for immediately available ER MD    Location [facility]    Staff Present Elige Ko, RCP,RRT,BSRT;Maxon Conetta BS, , Exercise Physiologist;Donley Harland Katrinka Blazing, RN, ADN    Virtual Visit No    Medication changes reported     No    Fall or balance concerns reported    No    Warm-up and Cool-down Performed on first and last piece of equipment    Resistance Training Performed Yes    VAD Patient? No    PAD/SET Patient? No      Pain Assessment   Currently in Pain? No/denies                Social History   Tobacco Use  Smoking Status Former   Current packs/day: 0.00   Average packs/day: 1.5 packs/day for 55.0 years (82.5 ttl pk-yrs)   Types: Cigarettes   Start date: 08/12/1956   Quit date: 08/13/2011   Years since quitting: 11.7  Smokeless Tobacco Never    Goals Met:  Independence with exercise equipment Exercise tolerated well No report of concerns or symptoms today Strength training completed today  Goals Unmet:  Not Applicable  Comments: Pt able to follow exercise prescription today without complaint.  Will continue to monitor for progression.    Dr. Bethann Punches is Medical Director for [facility] Cardiac Rehabilitation.  Dr. Vida Rigger is Medical Director for [facility] Pulmonary Rehabilitation.

## 2023-05-06 ENCOUNTER — Encounter: Payer: Medicare HMO | Admitting: *Deleted

## 2023-05-06 DIAGNOSIS — Z87891 Personal history of nicotine dependence: Secondary | ICD-10-CM | POA: Diagnosis not present

## 2023-05-06 DIAGNOSIS — J449 Chronic obstructive pulmonary disease, unspecified: Secondary | ICD-10-CM | POA: Diagnosis not present

## 2023-05-06 NOTE — Progress Notes (Signed)
Daily Session Note  Patient Details  Name: Gregory Haynes MRN: [id number] Date of Birth: [birth year] Referring Provider:    Encounter Date: 05/06/2023  Check In:  Session Check In - 05/06/23 0748       Check-In   Supervising physician immediately available to respond to emergencies See telemetry face sheet for immediately available ER MD    Location [facility]    Staff Present Ronette Deter, BS, Exercise Physiologist;Maxon Conetta BS, , Exercise Physiologist;Sophiarose Eades Katrinka Blazing, RN, ADN    Virtual Visit No    Medication changes reported     No    Fall or balance concerns reported    No    Warm-up and Cool-down Performed on first and last piece of equipment    Resistance Training Performed Yes    VAD Patient? No    PAD/SET Patient? No      Pain Assessment   Currently in Pain? No/denies                Social History   Tobacco Use  Smoking Status Former   Current packs/day: 0.00   Average packs/day: 1.5 packs/day for 55.0 years (82.5 ttl pk-yrs)   Types: Cigarettes   Start date: 08/12/1956   Quit date: 08/13/2011   Years since quitting: 11.7  Smokeless Tobacco Never    Goals Met:  Independence with exercise equipment Exercise tolerated well No report of concerns or symptoms today Strength training completed today  Goals Unmet:  Not Applicable  Comments: Pt able to follow exercise prescription today without complaint.  Will continue to monitor for progression.    Dr. Bethann Punches is Medical Director for [facility] Cardiac Rehabilitation.  Dr. Vida Rigger is Medical Director for [facility] Pulmonary Rehabilitation.

## 2023-05-07 DIAGNOSIS — J449 Chronic obstructive pulmonary disease, unspecified: Secondary | ICD-10-CM | POA: Diagnosis not present

## 2023-05-07 DIAGNOSIS — J432 Centrilobular emphysema: Secondary | ICD-10-CM | POA: Diagnosis not present

## 2023-05-08 ENCOUNTER — Encounter: Payer: Medicare HMO | Admitting: *Deleted

## 2023-05-08 DIAGNOSIS — J449 Chronic obstructive pulmonary disease, unspecified: Secondary | ICD-10-CM

## 2023-05-08 DIAGNOSIS — Z87891 Personal history of nicotine dependence: Secondary | ICD-10-CM | POA: Diagnosis not present

## 2023-05-08 NOTE — Progress Notes (Signed)
Daily Session Note  Patient Details  Name: Gregory Haynes MRN: [id number] Date of Birth: 1940-11-28 Referring Provider:    Encounter Date: 05/08/2023  Check In:  Session Check In - 05/08/23 0749       Check-In   Supervising physician immediately available to respond to emergencies See telemetry face sheet for immediately available ER MD    Location [facility]    Staff Present Hulen Luster, BS, RRT, CPFT;Maxon Conetta BS, , Exercise Physiologist;Wrenna Saks Katrinka Blazing, RN, ADN    Virtual Visit No    Medication changes reported     No    Fall or balance concerns reported    No    Warm-up and Cool-down Performed on first and last piece of equipment    Resistance Training Performed Yes    VAD Patient? No    PAD/SET Patient? No      Pain Assessment   Currently in Pain? No/denies                Social History   Tobacco Use  Smoking Status Former   Current packs/day: 0.00   Average packs/day: 1.5 packs/day for 55.0 years (82.5 ttl pk-yrs)   Types: Cigarettes   Start date: 08/12/1956   Quit date: 08/13/2011   Years since quitting: 11.7  Smokeless Tobacco Never    Goals Met:  Independence with exercise equipment Exercise tolerated well No report of concerns or symptoms today Strength training completed today  Goals Unmet:  Not Applicable  Comments: Pt able to follow exercise prescription today without complaint.  Will continue to monitor for progression.    Dr. Bethann Punches is Medical Director for [facility] Cardiac Rehabilitation.  Dr. Vida Rigger is Medical Director for [facility] Pulmonary Rehabilitation.

## 2023-05-13 ENCOUNTER — Encounter: Payer: Medicare HMO | Attending: Pulmonary Disease | Admitting: *Deleted

## 2023-05-13 DIAGNOSIS — J449 Chronic obstructive pulmonary disease, unspecified: Secondary | ICD-10-CM | POA: Diagnosis not present

## 2023-05-13 DIAGNOSIS — Z5189 Encounter for other specified aftercare: Secondary | ICD-10-CM | POA: Diagnosis not present

## 2023-05-13 NOTE — Progress Notes (Signed)
Daily Session Note  Patient Details  Name: Gregory Haynes MRN: [id number] Date of Birth: [birth year] Referring Provider:    Encounter Date: 05/13/2023  Check In:  Session Check In - 05/13/23 1004       Check-In   Supervising physician immediately available to respond to emergencies See telemetry face sheet for immediately available ER MD    Location [facility]    Staff Present Cora Collum, RN, BSN, CCRP;Noah Tickle, BS, Exercise Physiologist;Maxon Conetta BS, , Exercise Physiologist    Virtual Visit No    Medication changes reported     No    Fall or balance concerns reported    No    Warm-up and Cool-down Performed on first and last piece of equipment    Resistance Training Performed Yes    VAD Patient? No    PAD/SET Patient? No      Pain Assessment   Currently in Pain? No/denies                Social History   Tobacco Use  Smoking Status Former   Current packs/day: 0.00   Average packs/day: 1.5 packs/day for 55.0 years (82.5 ttl pk-yrs)   Types: Cigarettes   Start date: 08/12/1956   Quit date: 08/13/2011   Years since quitting: 11.7  Smokeless Tobacco Never    Goals Met:  Proper associated with RPD/PD & O2 Sat Independence with exercise equipment Exercise tolerated well No report of concerns or symptoms today  Goals Unmet:  Not Applicable  Comments: Pt able to follow exercise prescription today without complaint.  Will continue to monitor for progression.    Dr. Bethann Punches is Medical Director for [facility] Cardiac Rehabilitation.  Dr. Vida Rigger is Medical Director for [facility] Pulmonary Rehabilitation.

## 2023-05-14 ENCOUNTER — Encounter: Payer: Self-pay | Admitting: *Deleted

## 2023-05-14 DIAGNOSIS — J9621 Acute and chronic respiratory failure with hypoxia: Secondary | ICD-10-CM | POA: Diagnosis not present

## 2023-05-14 DIAGNOSIS — J449 Chronic obstructive pulmonary disease, unspecified: Secondary | ICD-10-CM

## 2023-05-14 NOTE — Progress Notes (Signed)
Pulmonary Individual Treatment Plan  Patient Details  Name: Gregory Haynes MRN: [id number] Date of Birth: 03-28-1941 Referring Provider:    Initial Encounter Date:  Flowsheet Row Pulmonary Rehab from 12/05/2022 in [facility] Cardiac and Pulmonary Rehab  Date 12/05/22       Visit Diagnosis: Chronic obstructive pulmonary disease, unspecified COPD type (HCC)  Patient's Home Medications on Admission:  Current Outpatient Medications:    acidophilus (RISAQUAD) CAPS capsule, Take 1 capsule by mouth daily., Disp: , Rfl:    albuterol (VENTOLIN HFA) 108 (90 Base) MCG/ACT inhaler, Inhale 1-2 puffs into the lungs every 4 (four) hours as needed for wheezing or shortness of breath., Disp: 18 g, Rfl: 3   atorvastatin (LIPITOR) 40 MG tablet, Take 1 tablet (40 mg total) by mouth every evening., Disp: 90 tablet, Rfl: 3   carvedilol (COREG) 3.125 MG tablet, Take 1 tablet (3.125 mg total) by mouth 2 (two) times daily with a meal., Disp: 60 tablet, Rfl: 1   clobetasol cream (TEMOVATE) 0.05 %, Apply 1 Application topically 2 (two) times daily., Disp: , Rfl:    feeding supplement (ENSURE ENLIVE / ENSURE PLUS) LIQD, Take 237 mLs by mouth 3 (three) times daily. (Patient not taking: Reported on 12/03/2022), Disp: 237 mL, Rfl: 12   Fluticasone-Umeclidin-Vilant (TRELEGY ELLIPTA) 100-62.5-25 MCG/INH AEPB, Inhale 1 puff into the lungs daily., Disp: , Rfl:    folic acid (FOLVITE) 1 MG tablet, Take 1 mg by mouth daily. Unsure dose (Patient not taking: Reported on 12/03/2022), Disp: , Rfl:    ipratropium-albuterol (DUONEB) 0.5-2.5 (3) MG/3ML SOLN, Inhale into the lungs., Disp: , Rfl:    lisinopril (ZESTRIL) 2.5 MG tablet, Take 1 tablet (2.5 mg total) by mouth daily., Disp: 30 tablet, Rfl: 0   OXYGEN, Inhale 2 L/hr into the lungs. At night and as needed during day., Disp: , Rfl:   Past Medical History: Past Medical History:  Diagnosis Date   Anemia    Cancer (HCC)    BCC   Cataract    COPD (chronic obstructive pulmonary  disease) (HCC)    Helicobacter pylori ab+    Hyperlipidemia    Hypertension    Psoriasis    Wears hearing aid in both ears     Tobacco Use: Social History   Tobacco Use  Smoking Status Former   Current packs/day: 0.00   Average packs/day: 1.5 packs/day for 55.0 years (82.5 ttl pk-yrs)   Types: Cigarettes   Start date: 08/12/1956   Quit date: 08/13/2011   Years since quitting: 11.7  Smokeless Tobacco Never    Labs: Review Flowsheet  More data exists      Latest Ref Rng & Units 05/17/2020 05/01/2022 10/31/2022 11/01/2022 11/02/2022  Labs for ITP Cardiac and Pulmonary Rehab  Cholestrol 100 - 199 mg/dL 409  811  - - -  LDL (calc) 0 - 99 mg/dL 79  70  - - -  HDL-C >91 mg/dL 48  49  - - -  Trlycerides 0 - 149 mg/dL 76  84  - - -  Hemoglobin A1c 4.8 - 5.6 % - - 6.3  - -  PH, Arterial 7.35 - 7.45 - - 7.38  7.36  7.43  7.47   PCO2 arterial 32 - 48 mmHg - - 71  80  69  62   Bicarbonate 20.0 - 28.0 mmol/L - - 48.4  42.0  45.2  45.6  45.8  45.1   O2 Saturation % - - 67  98.3  95.3  94.7  98.9  99.2     Details       Multiple values from one day are sorted in reverse-chronological order          Pulmonary Assessment Scores:  Pulmonary Assessment Scores     Row Name 12/05/22 1617         ADL UCSD   ADL Phase Entry     SOB Score total 27     Rest 0     Walk 0     Stairs 2     Bath 0     Dress 0     Shop 1       CAT Score   CAT Score 6       mMRC Score   mMRC Score 1              UCSD: Self-administered rating of dyspnea associated with activities of daily living (ADLs) 6-point scale (0 = "not at all" to 5 = "maximal or unable to do because of breathlessness")  Scoring Scores range from 0 to 120.  Minimally important difference is 5 units  CAT: CAT can identify the health impairment of COPD patients and is better correlated with disease progression.  CAT has a scoring range of zero to 40. The CAT score is classified into four groups of low (less than 10),  medium (10 - 20), high (21-30) and very high (31-40) based on the impact level of disease on health status. A CAT score over 10 suggests significant symptoms.  A worsening CAT score could be explained by an exacerbation, poor medication adherence, poor inhaler technique, or progression of COPD or comorbid conditions.  CAT MCID is 2 points  mMRC: mMRC (Modified Medical Research Council) Dyspnea Scale is used to assess the degree of baseline functional disability in patients of respiratory disease due to dyspnea. No minimal important difference is established. A decrease in score of 1 point or greater is considered a positive change.   Pulmonary Function Assessment:   Exercise Target Goals: Exercise Program Goal: Individual exercise prescription set using results from initial 6 min walk test and THRR while considering  patient's activity barriers and safety.   Exercise Prescription Goal: Initial exercise prescription builds to 30-45 minutes a day of aerobic activity, 2-3 days per week.  Home exercise guidelines will be given to patient during program as part of exercise prescription that the participant will acknowledge.  Education: Aerobic Exercise: - Group verbal and visual presentation on the components of exercise prescription. Introduces F.I.T.T principle from ACSM for exercise prescriptions.  Reviews F.I.T.T. principles of aerobic exercise including progression. Written material given at graduation.   Education: Resistance Exercise: - Group verbal and visual presentation on the components of exercise prescription. Introduces F.I.T.T principle from ACSM for exercise prescriptions  Reviews F.I.T.T. principles of resistance exercise including progression. Written material given at graduation.    Education: Exercise & Equipment Safety: - Individual verbal instruction and demonstration of equipment use and safety with use of the equipment. Flowsheet Row Pulmonary Rehab from 12/05/2022 in  [facility] Cardiac and Pulmonary Rehab  Education need identified 12/05/22  Date 12/05/22  Educator KW  Instruction Review Code 1- Verbalizes Understanding       Education: Exercise Physiology & General Exercise Guidelines: - Group verbal and written instruction with models to review the exercise physiology of the cardiovascular system and associated critical values. Provides general exercise guidelines with specific guidelines to those with heart or lung disease.    Education: Flexibility,  Balance, Mind/Body Relaxation: - Group verbal and visual presentation with interactive activity on the components of exercise prescription. Introduces F.I.T.T principle from ACSM for exercise prescriptions. Reviews F.I.T.T. principles of flexibility and balance exercise training including progression. Also discusses the mind body connection.  Reviews various relaxation techniques to help reduce and manage stress (i.e. Deep breathing, progressive muscle relaxation, and visualization). Balance handout provided to take home. Written material given at graduation.   Activity Barriers & Risk Stratification:  Activity Barriers & Cardiac Risk Stratification - 12/05/22 1630       Activity Barriers & Cardiac Risk Stratification   Activity Barriers Shortness of Breath;Balance Concerns;Deconditioning;Muscular Weakness;Assistive Device             6 Minute Walk:  6 Minute Walk     Row Name 12/05/22 1620         6 Minute Walk   Phase Initial     Distance 890 feet     Walk Time 6 minutes     # of Rest Breaks 0     MPH 1.68     METS 2.01     RPE 11     Perceived Dyspnea  1     VO2 Peak 7.03     Symptoms Yes (comment)     Comments SOB     Resting HR 114 bpm     Resting BP 112/56     Resting Oxygen Saturation  94 %     Exercise Oxygen Saturation  during 6 min walk 87 %     Max Ex. HR 125 bpm     Max Ex. BP 124/60     2 Minute Post BP 106/60       Interval HR   1 Minute HR 122     2 Minute HR 124      3 Minute HR 125     4 Minute HR 124     5 Minute HR 123     6 Minute HR 125     2 Minute Post HR 111     Interval Heart Rate? Yes       Interval Oxygen   Interval Oxygen? Yes     Baseline Oxygen Saturation % 94 %     1 Minute Oxygen Saturation % 92 %     1 Minute Liters of Oxygen 2 L  continuous     2 Minute Oxygen Saturation % 90 %     2 Minute Liters of Oxygen 2 L     3 Minute Oxygen Saturation % 89 %     3 Minute Liters of Oxygen 2 L     4 Minute Oxygen Saturation % 88 %     4 Minute Liters of Oxygen 2 L     5 Minute Oxygen Saturation % 87 %     5 Minute Liters of Oxygen 2 L     6 Minute Oxygen Saturation % 87 %     6 Minute Liters of Oxygen 2 L     2 Minute Post Oxygen Saturation % 96 %     2 Minute Post Liters of Oxygen 2 L             Oxygen Initial Assessment:  Oxygen Initial Assessment - 12/05/22 1617       Home Oxygen   Home Oxygen Device Portable Concentrator;Home Concentrator    Sleep Oxygen Prescription Continuous;CPAP    Liters per minute 2    Home Exercise Oxygen  Prescription Continuous    Liters per minute 2    Home Resting Oxygen Prescription Continuous    Liters per minute 2    Compliance with Home Oxygen Use Yes      Initial 6 min Walk   Oxygen Used Pulsed    Liters per minute 2      Program Oxygen Prescription   Program Oxygen Prescription Continuous    Liters per minute 2      Intervention   Short Term Goals To learn and exhibit compliance with exercise, home and travel O2 prescription;To learn and understand importance of monitoring SPO2 with pulse oximeter and demonstrate accurate use of the pulse oximeter.;To learn and understand importance of maintaining oxygen saturations>88%;To learn and demonstrate proper pursed lip breathing techniques or other breathing techniques. ;To learn and demonstrate proper use of respiratory medications    Long  Term Goals Exhibits compliance with exercise, home  and travel O2 prescription;Verbalizes  importance of monitoring SPO2 with pulse oximeter and return demonstration;Maintenance of O2 saturations>88%;Exhibits proper breathing techniques, such as pursed lip breathing or other method taught during program session;Compliance with respiratory medication;Demonstrates proper use of MDI's             Oxygen Re-Evaluation:  Oxygen Re-Evaluation     Row Name 02/18/23 0815 02/25/23 0741 05/13/23 0844         Program Oxygen Prescription   Program Oxygen Prescription Continuous Continuous Continuous     Liters per minute 2 2 2        Home Oxygen   Home Oxygen Device Portable Concentrator;Home Concentrator Portable Concentrator;Home Concentrator Portable Concentrator;Home Concentrator     Sleep Oxygen Prescription Continuous;CPAP Continuous;CPAP Continuous;CPAP     Liters per minute 2 2 2      Home Exercise Oxygen Prescription Continuous Continuous Continuous     Liters per minute 2 2 2      Home Resting Oxygen Prescription Continuous Continuous Continuous     Liters per minute 2 2 2      Compliance with Home Oxygen Use Yes Yes Yes       Goals/Expected Outcomes   Short Term Goals To learn and demonstrate proper pursed lip breathing techniques or other breathing techniques.  To learn and demonstrate proper pursed lip breathing techniques or other breathing techniques. ;To learn and exhibit compliance with exercise, home and travel O2 prescription;To learn and understand importance of monitoring SPO2 with pulse oximeter and demonstrate accurate use of the pulse oximeter.;To learn and understand importance of maintaining oxygen saturations>88%;To learn and demonstrate proper use of respiratory medications To learn and demonstrate proper pursed lip breathing techniques or other breathing techniques. ;To learn and exhibit compliance with exercise, home and travel O2 prescription;To learn and understand importance of monitoring SPO2 with pulse oximeter and demonstrate accurate use of the pulse  oximeter.;To learn and understand importance of maintaining oxygen saturations>88%;To learn and demonstrate proper use of respiratory medications     Long  Term Goals Exhibits proper breathing techniques, such as pursed lip breathing or other method taught during program session Exhibits compliance with exercise, home  and travel O2 prescription;Verbalizes importance of monitoring SPO2 with pulse oximeter and return demonstration;Maintenance of O2 saturations>88%;Exhibits proper breathing techniques, such as pursed lip breathing or other method taught during program session;Compliance with respiratory medication Exhibits compliance with exercise, home  and travel O2 prescription;Verbalizes importance of monitoring SPO2 with pulse oximeter and return demonstration;Maintenance of O2 saturations>88%;Exhibits proper breathing techniques, such as pursed lip breathing or other method taught during program  session;Compliance with respiratory medication     Comments Reviewed PLB technique with pt.  Talked about how it works and it's importance in maintaining their exercise saturations. Patient reports compliance with home oxygen use and respiratory medications. He does have a pulse ox at home and consistently monitors SaO2 levels and reports that they ususally stay above 88%. He reports that if he gets SOB or notices decreases in SaO2 levels he feels comfortable using PLB. Reviewd PLB technique with patient today. Annette Stable states that he has continues to practice his PLB with exercise. He also has a pulse oximeter at home and his beeen checking his O2 saturations. He reports that his O2 occasionally drops below 90% during exercise but he states that it recovers quickly with rest.     Goals/Expected Outcomes Short: Become more profiecient at using PLB. Long: Become independent at using PLB. Short: continue to attend pulmonary rehab consistently to work on strength and lung function. Long: continue to use PLB independently  Short: Continue to monitor O2 saturations at home. Long: Continue to use PLB independently              Oxygen Discharge (Final Oxygen Re-Evaluation):  Oxygen Re-Evaluation - 05/13/23 0844       Program Oxygen Prescription   Program Oxygen Prescription Continuous    Liters per minute 2      Home Oxygen   Home Oxygen Device Portable Concentrator;Home Concentrator    Sleep Oxygen Prescription Continuous;CPAP    Liters per minute 2    Home Exercise Oxygen Prescription Continuous    Liters per minute 2    Home Resting Oxygen Prescription Continuous    Liters per minute 2    Compliance with Home Oxygen Use Yes      Goals/Expected Outcomes   Short Term Goals To learn and demonstrate proper pursed lip breathing techniques or other breathing techniques. ;To learn and exhibit compliance with exercise, home and travel O2 prescription;To learn and understand importance of monitoring SPO2 with pulse oximeter and demonstrate accurate use of the pulse oximeter.;To learn and understand importance of maintaining oxygen saturations>88%;To learn and demonstrate proper use of respiratory medications    Long  Term Goals Exhibits compliance with exercise, home  and travel O2 prescription;Verbalizes importance of monitoring SPO2 with pulse oximeter and return demonstration;Maintenance of O2 saturations>88%;Exhibits proper breathing techniques, such as pursed lip breathing or other method taught during program session;Compliance with respiratory medication    Comments Reviewd PLB technique with patient today. Annette Stable states that he has continues to practice his PLB with exercise. He also has a pulse oximeter at home and his beeen checking his O2 saturations. He reports that his O2 occasionally drops below 90% during exercise but he states that it recovers quickly with rest.    Goals/Expected Outcomes Short: Continue to monitor O2 saturations at home. Long: Continue to use PLB independently              Initial Exercise Prescription:  Initial Exercise Prescription - 12/05/22 1600       Date of Initial Exercise RX and Referring Provider   Date 12/05/22      Oxygen   Oxygen Continuous    Liters 2    Maintain Oxygen Saturation 88% or higher      NuStep   Level 1    SPM 80    Minutes 15    METs 2      REL-XR   Level 1    Speed 50  Minutes 15    METs 2      Biostep-RELP   Level 1    SPM 50    Minutes 15    METs 2      Track   Laps 20    Minutes 15    METs 2.09      Prescription Details   Frequency (times per week) 3    Duration Progress to 30 minutes of continuous aerobic without signs/symptoms of physical distress      Intensity   THRR 40-80% of Max Heartrate 122- 133    Ratings of Perceived Exertion 11-13    Perceived Dyspnea 0-4      Progression   Progression Continue to progress workloads to maintain intensity without signs/symptoms of physical distress.      Resistance Training   Training Prescription Yes    Weight 3 lb    Reps 10-15             Perform Capillary Blood Glucose checks as needed.  Exercise Prescription Changes:   Exercise Prescription Changes     Row Name 12/05/22 1600 03/05/23 1000 03/19/23 1300 04/01/23 1400 04/03/23 0900     Response to Exercise   Blood Pressure (Admit) 112/56 110/60 112/60 104/60 --   Blood Pressure (Exercise) 124/60 142/66 140/70 142/60 --   Blood Pressure (Exit) 106/60 106/62 110/62 108/58 --   Heart Rate (Admit) 114 bpm 91 bpm 108 bpm 110 bpm --   Heart Rate (Exercise) 125 bpm 122 bpm 124 bpm 127 bpm --   Heart Rate (Exit) 111 bpm 114 bpm 116 bpm 123 bpm --   Oxygen Saturation (Admit) 94 % 95 % 90 % 95 % --   Oxygen Saturation (Exercise) 87 % 96 % 89 % 91 % --   Oxygen Saturation (Exit) 96 % 94 % 95 % 96 % --   Rating of Perceived Exertion (Exercise) 11 15 13 15  --   Perceived Dyspnea (Exercise) 1 2 2  0 --   Symptoms SOB -- SOB SOB --   Comments walk test results -- -- -- --   Duration --  -- -- Progress to 30 minutes of  aerobic without signs/symptoms of physical distress --   Intensity -- -- -- THRR unchanged --     Progression   Progression -- -- -- Continue to progress workloads to maintain intensity without signs/symptoms of physical distress. --   Average METs -- -- 1.8 2.01 --     Resistance Training   Training Prescription -- Yes Yes Yes --   Weight -- 4 lb 4 lb 4 lb --   Reps -- 10-15 10-15 10-15 --     Interval Training   Interval Training -- -- -- No --     Oxygen   Oxygen -- Continuous Continuous Continuous --   Liters -- 2 2 2  --     Recumbant Bike   Level -- 2 2 -- --   Watts -- 18 18 -- --   Minutes -- 15 15 -- --   METs -- 2.68 2.69 -- --     NuStep   Level -- 3 -- -- --   Minutes -- 15 -- -- --   METs -- 2.1 -- -- --     Biostep-RELP   Level -- 1 2 3  --   SPM -- -- 50 -- --   Minutes -- 15 15 15  --   METs -- 2 2 2  --  Track   Laps -- 19 25 19  --   Minutes -- 15 15 15  --   METs -- 2.03 2.36 2.03 --     Home Exercise Plan   Plans to continue exercise at -- -- -- -- Home (comment)  Walking at home around [facility] or at the mall   Frequency -- -- -- -- Add 3 additional days to program exercise sessions.   Initial Home Exercises Provided -- -- -- -- 04/03/23     Oxygen   Maintain Oxygen Saturation -- 88% or higher 88% or higher 88% or higher 88% or higher    Row Name 04/17/23 1100 05/01/23 1600           Response to Exercise   Blood Pressure (Admit) 102/60 122/72      Blood Pressure (Exercise) 130/68 --      Blood Pressure (Exit) 120/62 122/68      Heart Rate (Admit) 89 bpm 82 bpm      Heart Rate (Exercise) 111 bpm 113 bpm      Heart Rate (Exit) 106 bpm 104 bpm      Oxygen Saturation (Admit) 90 % 88 %      Oxygen Saturation (Exercise) 98 % 87 %      Oxygen Saturation (Exit) 98 % 96 %      Rating of Perceived Exertion (Exercise) 11 13      Perceived Dyspnea (Exercise) 1 1      Symptoms SOB SOB      Duration  Progress to 30 minutes of  aerobic without signs/symptoms of physical distress Progress to 30 minutes of  aerobic without signs/symptoms of physical distress      Intensity THRR unchanged THRR unchanged        Progression   Progression Continue to progress workloads to maintain intensity without signs/symptoms of physical distress. Continue to progress workloads to maintain intensity without signs/symptoms of physical distress.      Average METs 2.09 2.28        Resistance Training   Training Prescription Yes Yes      Weight 4 lb 4 lb      Reps 10-15 10-15        Interval Training   Interval Training No No        Oxygen   Oxygen Continuous Continuous      Liters 2 2        Recumbant Bike   Level -- 2      Watts -- 15      Minutes -- 15      METs -- 2.58        NuStep   Level 2 3      Minutes 15 15      METs 2.09 2.3        T5 Nustep   Level 2 --      Minutes 15 --      METs 1.8 --        Track   Laps 28 24      Minutes 15 15      METs 2.52 2.31        Home Exercise Plan   Plans to continue exercise at Home (comment)  Walking at home around [facility] or at the mall Home (comment)  Walking at home around [facility] or at the mall      Frequency Add 3 additional days to program exercise sessions. Add 3 additional days to program exercise  sessions.      Initial Home Exercises Provided 04/03/23 04/03/23        Oxygen   Maintain Oxygen Saturation 88% or higher 88% or higher               Exercise Comments:   Exercise Comments     Row Name 02/18/23 1610           Exercise Comments First full day of exercise!  Patient was oriented to gym and equipment including functions, settings, policies, and procedures.  Patient's individual exercise prescription and treatment plan were reviewed.  All starting workloads were established based on the results of the 6 minute walk test done at initial orientation visit.  The plan for exercise progression was also  introduced and progression will be customized based on patient's performance and goals.                Exercise Goals and Review:   Exercise Goals     Row Name 12/05/22 1632             Exercise Goals   Increase Physical Activity Yes       Intervention Provide advice, education, support and counseling about physical activity/exercise needs.;Develop an individualized exercise prescription for aerobic and resistive training based on initial evaluation findings, risk stratification, comorbidities and participant's personal goals.       Expected Outcomes Short Term: Attend rehab on a regular basis to increase amount of physical activity.;Long Term: Add in home exercise to make exercise part of routine and to increase amount of physical activity.;Long Term: Exercising regularly at least 3-5 days a week.       Increase Strength and Stamina Yes       Intervention Provide advice, education, support and counseling about physical activity/exercise needs.;Develop an individualized exercise prescription for aerobic and resistive training based on initial evaluation findings, risk stratification, comorbidities and participant's personal goals.       Expected Outcomes Short Term: Increase workloads from initial exercise prescription for resistance, speed, and METs.;Short Term: Perform resistance training exercises routinely during rehab and add in resistance training at home;Long Term: Improve cardiorespiratory fitness, muscular endurance and strength as measured by increased METs and functional capacity ( )       Able to understand and use rate of perceived exertion (RPE) scale Yes       Intervention Provide education and explanation on how to use RPE scale       Expected Outcomes Short Term: Able to use RPE daily in rehab to express subjective intensity level;Long Term:  Able to use RPE to guide intensity level when exercising independently       Able to understand and use Dyspnea scale Yes        Intervention Provide education and explanation on how to use Dyspnea scale       Expected Outcomes Short Term: Able to use Dyspnea scale daily in rehab to express subjective sense of shortness of breath during exertion;Long Term: Able to use Dyspnea scale to guide intensity level when exercising independently       Knowledge and understanding of Target Heart Rate Range (THRR) Yes       Intervention Provide education and explanation of THRR including how the numbers were predicted and where they are located for reference       Expected Outcomes Short Term: Able to state/look up THRR;Long Term: Able to use THRR to govern intensity when exercising independently;Short Term: Able to use daily as guideline  for intensity in rehab       Able to check pulse independently Yes       Intervention Provide education and demonstration on how to check pulse in carotid and radial arteries.;Review the importance of being able to check your own pulse for safety during independent exercise       Expected Outcomes Long Term: Able to check pulse independently and accurately;Short Term: Able to explain why pulse checking is important during independent exercise       Understanding of Exercise Prescription Yes       Intervention Provide education, explanation, and written materials on patient's individual exercise prescription       Expected Outcomes Short Term: Able to explain program exercise prescription;Long Term: Able to explain home exercise prescription to exercise independently                Exercise Goals Re-Evaluation :  Exercise Goals Re-Evaluation     Row Name 12/24/22 1453 02/18/23 0814 02/25/23 0737 03/05/23 1011 03/19/23 1332     Exercise Goal Re-Evaluation   Exercise Goals Review Increase Physical Activity;Understanding of Exercise Prescription;Increase Strength and Stamina Able to understand and use rate of perceived exertion (RPE) scale;Able to understand and use Dyspnea scale;Understanding of  Exercise Prescription Increase Physical Activity;Increase Strength and Stamina Increase Physical Activity;Understanding of Exercise Prescription;Increase Strength and Stamina Increase Physical Activity;Increase Strength and Stamina;Understanding of Exercise Prescription   Comments Patient did not complete their first day of exercise yet. Patient had an elevated resting HR at his orientation and per Dr. Karna Christmas, had to get a holter ordered to rule out any abnormalities. Patient needs to follow up with the cardiologist and get clearance from them and Dr. Mervyn Skeeters before starting rehab back up. Reviewed RPE  and dyspnea scale, THR and program prescription with pt today.  Pt voiced understanding and was given a copy of goals to take home. After Bill's orientation he was out for an extended period of time due to health issues and hospitalization. He has now resently started the program and is attending consistently. He is tolerating exercise well and getting established in his exercise routine. Annette Stable has started his exercise sessions. He has increased his Nustep T4 level to 3 and his handweight to 4 LBs. His RPE is 12 (one at 15 on the Rec Bike) . Will contiue to encourage him to increase workloads as tolerated and  tomonitor his exericse progression. Annette Stable is doing well in rehab. He increased his track laps to 25. He also continues to do well with the 4 lb weights. We will continue to monitor his progress in the program.   Expected Outcomes Short: Get clearance to rehab Long: Build up overall strength and stamina Short: Use RPE daily to regulate intensity. Long: Follow program prescription in THR. Short: attend pulmonary rehab consistently and continue to get estalished in exercise routine. Long: become independent with exercise routine. MWN:[id number] workloads as tolerated with goal of increased stamina and strength.   LTG: Continued exercise progression as tolerated during program and after discharge. Short: Continue to  follow current exercise prescription, and progressively increase workloads. Long: Continue exercise to improve strength and stamina.    Row Name 04/01/23 1450 04/03/23 0845 04/17/23 1129 05/01/23 1654 05/13/23 0829     Exercise Goal Re-Evaluation   Exercise Goals Review Increase Physical Activity;Increase Strength and Stamina;Understanding of Exercise Prescription Increase Physical Activity;Increase Strength and Stamina;Understanding of Exercise Prescription Increase Physical Activity;Increase Strength and Stamina;Understanding of Exercise Prescription Increase Physical Activity;Increase Strength  and Stamina;Understanding of Exercise Prescription Increase Physical Activity;Increase Strength and Stamina;Understanding of Exercise Prescription   Comments Annette Stable is doing well in rehab. He has only attended one session since the last review. He continues to work at level 4 on [employer] and walk the track. We will continue to monitor his progress in the program upon return. Reviewed home exercise with pt today.  Pt plans to walk every morning around the mall or around [facility] for exercise. Reviewed THR, pulse, RPE, sign and symptoms, pulse oximetery and when to call 911 or MD.  Also discussed weather considerations and indoor options.  Pt voiced understanding. Annette Stable continues to do well in rehab. He recently increased his track laps to 28 from 22. He also added the T5 nustep at level 2 to his prescription. We will continue to monitor his progress in the program. Annette Stable continues to do well in rehab. He recently increased his level on the T4 nustep from 2 to 3. He has maintained his intensity on the recumbent bike and track. We will continue to monitor his progress in the program. Annette Stable states that he has been doing well with exercise. He reports noticing improvement with his strength and stamina, as well as his shortness of breath during exercise. On his days away from rehab he has been walking for 45 minutes at  the [facility] in [place]. He states that he stops walking when his O2 saturations drop below 90%, but they usually recover quickly within 30 seconds. We will continue to monitor his progress in the program.   Expected Outcomes Short: Return to the program once cleared and able. Long: Continue exercise to improve strength and stamina. Short: Continue exercise outside of the program. Long: Continue exercise to improve strength and stamina. Short: Continue to follow current exercise prescription, and progressively increase workloads. Long: Continue exercise to improve strength and stamina. Short: Continue to follow current exercise prescription, and progressively increase workloads. Long: Continue exercise to improve strength and stamina. Short: Continue to walk at the mall on days away from rehab. Long: Continue to exercise independently.            Discharge Exercise Prescription (Final Exercise Prescription Changes):  Exercise Prescription Changes - 05/01/23 1600       Response to Exercise   Blood Pressure (Admit) 122/72    Blood Pressure (Exit) 122/68    Heart Rate (Admit) 82 bpm    Heart Rate (Exercise) 113 bpm    Heart Rate (Exit) 104 bpm    Oxygen Saturation (Admit) 88 %    Oxygen Saturation (Exercise) 87 %    Oxygen Saturation (Exit) 96 %    Rating of Perceived Exertion (Exercise) 13    Perceived Dyspnea (Exercise) 1    Symptoms SOB    Duration Progress to 30 minutes of  aerobic without signs/symptoms of physical distress    Intensity THRR unchanged      Progression   Progression Continue to progress workloads to maintain intensity without signs/symptoms of physical distress.    Average METs 2.28      Resistance Training   Training Prescription Yes    Weight 4 lb    Reps 10-15      Interval Training   Interval Training No      Oxygen   Oxygen Continuous    Liters 2      Recumbant Bike   Level 2    Watts 15    Minutes 15    METs  2.58      NuStep   Level  3    Minutes 15    METs 2.3      Track   Laps 24    Minutes 15    METs 2.31      Home Exercise Plan   Plans to continue exercise at Home (comment)   Walking at home around [facility] or at the mall   Frequency Add 3 additional days to program exercise sessions.    Initial Home Exercises Provided 04/03/23      Oxygen   Maintain Oxygen Saturation 88% or higher             Nutrition:  Target Goals: Understanding of nutrition guidelines, daily intake of sodium 1500mg , cholesterol 200mg , calories 30% from fat and 7% or less from saturated fats, daily to have 5 or more servings of fruits and vegetables.  Education: All About Nutrition: -Group instruction provided by verbal, written material, interactive activities, discussions, models, and posters to present general guidelines for heart healthy nutrition including fat, fiber, MyPlate, the role of sodium in heart healthy nutrition, utilization of the nutrition label, and utilization of this knowledge for meal planning. Follow up email sent as well. Written material given at graduation. Flowsheet Row Pulmonary Rehab from 12/05/2022 in [facility] Cardiac and Pulmonary Rehab  Education need identified 12/05/22       Biometrics:  Pre Biometrics - 12/05/22 1630       Pre Biometrics   Height 5' 11.5" (1.816 m)    Weight 180 lb 8 oz (81.9 kg)    Waist Circumference --   declined   Hip Circumference --   declined   BMI (Calculated) 24.83    Single Leg Stand 5.5 seconds              Nutrition Therapy Plan and Nutrition Goals:  Nutrition Therapy & Goals - 12/05/22 1619       Intervention Plan   Intervention Prescribe, educate and counsel regarding individualized specific dietary modifications aiming towards targeted core components such as weight, hypertension, lipid management, diabetes, heart failure and other comorbidities.    Expected Outcomes Short Term Goal: Understand basic principles of dietary content, such as  calories, fat, sodium, cholesterol and nutrients.;Short Term Goal: A plan has been developed with personal nutrition goals set during dietitian appointment.;Long Term Goal: Adherence to prescribed nutrition plan.             Nutrition Assessments:  MEDIFICTS Score Key: >=70 Need to make dietary changes  40-70 Heart Healthy Diet <= 40 Therapeutic Level Cholesterol Diet  Flowsheet Row Pulmonary Rehab from 12/05/2022 in [facility] Cardiac and Pulmonary Rehab  Picture Your Plate Total Score on Admission 81      Picture Your Plate Scores: <47 Unhealthy dietary pattern with much room for improvement. 41-50 Dietary pattern unlikely to meet recommendations for good health and room for improvement. 51-60 More healthful dietary pattern, with some room for improvement.  >60 Healthy dietary pattern, although there may be some specific behaviors that could be improved.   Nutrition Goals Re-Evaluation:  Nutrition Goals Re-Evaluation     Row Name 02/25/23 0747 05/13/23 0835           Goals   Comment Patient decided to cancel RD apt and defer meeting for now. He plans to attend the nutrition group education class in the future. Annette Stable has not met with the RD and states he feels he does not need to at this time. He  states that his wife is very good at helping him with his nutrition as he states "she could write a book." He has cut out junk food and continues to work towards a healthy diet.               Nutrition Goals Discharge (Final Nutrition Goals Re-Evaluation):  Nutrition Goals Re-Evaluation - 05/13/23 0835       Goals   Comment Bill has not met with the RD and states he feels he does not need to at this time. He states that his wife is very good at helping him with his nutrition as he states "she could write a book." He has cut out junk food and continues to work towards a healthy diet.             Psychosocial: Target Goals: Acknowledge presence or absence of significant  depression and/or stress, maximize coping skills, provide positive support system. Participant is able to verbalize types and ability to use techniques and skills needed for reducing stress and depression.   Education: Stress, Anxiety, and Depression - Group verbal and visual presentation to define topics covered.  Reviews how body is impacted by stress, anxiety, and depression.  Also discusses healthy ways to reduce stress and to treat/manage anxiety and depression.  Written material given at graduation.   Education: Sleep Hygiene -Provides group verbal and written instruction about how sleep can affect your health.  Define sleep hygiene, discuss sleep cycles and impact of sleep habits. Review good sleep hygiene tips.    Initial Review & Psychosocial Screening:  Initial Psych Review & Screening - 12/03/22 1033       Initial Review   Current issues with None Identified      Family Dynamics   Good Support System? Yes   wife, family     Barriers   Psychosocial barriers to participate in program There are no identifiable barriers or psychosocial needs.      Screening Interventions   Interventions Provide feedback about the scores to participant;Encouraged to exercise;To provide support and resources with identified psychosocial needs    Expected Outcomes Short Term goal: Utilizing psychosocial counselor, staff and physician to assist with identification of specific Stressors or current issues interfering with healing process. Setting desired goal for each stressor or current issue identified.;Long Term Goal: Stressors or current issues are controlled or eliminated.;Short Term goal: Identification and review with participant of any Quality of Life or Depression concerns found by scoring the questionnaire.;Long Term goal: The participant improves quality of Life and PHQ9 Scores as seen by post scores and/or verbalization of changes             Quality of Life Scores:  Scores of 19 and  below usually indicate a poorer quality of life in these areas.  A difference of  2-3 points is a clinically meaningful difference.  A difference of 2-3 points in the total score of the Quality of Life Index has been associated with significant improvement in overall quality of life, self-image, physical symptoms, and general health in studies assessing change in quality of life.  PHQ-9: Review Flowsheet  More data exists      12/05/2022 07/01/2022 05/01/2022 07/01/2019 06/24/2018  Depression screen PHQ 2/9  Decreased Interest 0 0 0 0 0  Down, Depressed, Hopeless 0 0 0 0 1  PHQ - 2 Score 0 0 0 0 1  Altered sleeping 0 0 0 - -  Tired, decreased energy 1 0 0 - -  Change in appetite 0 0 0 - -  Feeling bad or failure about yourself  0 0 0 - -  Trouble concentrating 0 0 0 - -  Moving slowly or fidgety/restless 0 0 0 - -  Suicidal thoughts 0 0 0 - -  PHQ-9 Score 1 0 0 - -  Difficult doing work/chores Not difficult at all Not difficult at all Not difficult at all - -    Details           Interpretation of Total Score  Total Score Depression Severity:  1-4 = Minimal depression, 5-9 = Mild depression, 10-14 = Moderate depression, 15-19 = Moderately severe depression, 20-27 = Severe depression   Psychosocial Evaluation and Intervention:  Psychosocial Evaluation - 12/03/22 1033       Psychosocial Evaluation & Interventions   Comments Mr. 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 He has been on oxygen for 12 years and feels comfortable managing it. After his most recent hospitalization, PT came to his house to show him some things. However, he and his wife admit that it wasn't too much to go off of and mainly he doesn't feel motivated to do the exercises alone. So they are both ready for him to get started in the program. He likes people and being in a group setting. He and his wife manage his health care needs and he states he feels like he is in a good spot mentally. He and his wife joked  that the biggest stressor in his life right now is that she won't let him have the dessert he wants every night. He is motivated to attend the program    Expected Outcomes Short: attend pulmonary rehab for education and exercise. Long: develop and maintain positive self care habits.    Continue Psychosocial Services  Follow up required by staff             Psychosocial Re-Evaluation:  Psychosocial Re-Evaluation     Row Name 02/25/23 0752 05/13/23 1610           Psychosocial Re-Evaluation   Current issues with None Identified None Identified      Comments Patient reports no new concerns or changes in sleep, stress, or mental health. He has recently started the program after a time gap between orientation and first day of class due to health concerns. He reports he is enjoying coming to class. Bill reports no current stressors at this time. He also has been getting good sleep and using his CPAP. He states that his wife and family are a good support system for him. He also enjoys playing cards with friends and exercising for stress relief.      Expected Outcomes Short: continue to attend pulmonary rehab consistenly for mental health benefit. Long: maintain good mental health habits. Short: Continue to exercise for mental boost. Long: Maintain positive outlook.      Interventions Encouraged to attend Pulmonary Rehabilitation for the exercise Encouraged to attend Pulmonary Rehabilitation for the exercise      Continue Psychosocial Services  Follow up required by staff Follow up required by staff               Psychosocial Discharge (Final Psychosocial Re-Evaluation):  Psychosocial Re-Evaluation - 05/13/23 9604       Psychosocial Re-Evaluation   Current issues with None Identified    Comments Bill reports no current stressors at this time. He also has been getting good sleep and using his CPAP. He states  that his wife and family are a good support system for him. He also enjoys playing  cards with friends and exercising for stress relief.    Expected Outcomes Short: Continue to exercise for mental boost. Long: Maintain positive outlook.    Interventions Encouraged to attend Pulmonary Rehabilitation for the exercise    Continue Psychosocial Services  Follow up required by staff             Education: Education Goals: Education classes will be provided on a weekly basis, covering required topics. Participant will state understanding/return demonstration of topics presented.  Learning Barriers/Preferences:  Learning Barriers/Preferences - 12/03/22 1014       Learning Barriers/Preferences   Learning Barriers Hearing    Learning Preferences Individual Instruction             General Pulmonary Education Topics:  Infection Prevention: - Provides verbal and written material to individual with discussion of infection control including proper hand washing and proper equipment cleaning during exercise session. Flowsheet Row Pulmonary Rehab from 12/05/2022 in [facility] Cardiac and Pulmonary Rehab  Education need identified 12/05/22  Date 12/05/22  Educator KW  Instruction Review Code 1- Verbalizes Understanding       Falls Prevention: - Provides verbal and written material to individual with discussion of falls prevention and safety. Flowsheet Row Pulmonary Rehab from 12/05/2022 in [facility] Cardiac and Pulmonary Rehab  Education need identified 12/05/22  Date 12/05/22  Educator KW  Instruction Review Code 1- Verbalizes Understanding       Chronic Lung Disease Review: - Group verbal instruction with posters, models, PowerPoint presentations and videos,  to review new updates, new respiratory medications, new advancements in procedures and treatments. Providing information on websites and "800" numbers for continued self-education. Includes information about supplement oxygen, available portable oxygen systems, continuous and intermittent flow rates, oxygen safety,  concentrators, and Medicare reimbursement for oxygen. Explanation of Pulmonary Drugs, including class, frequency, complications, importance of spacers, rinsing mouth after steroid MDI's, and proper cleaning methods for nebulizers. Review of basic lung anatomy and physiology related to function, structure, and complications of lung disease. Review of risk factors. Discussion about methods for diagnosing sleep apnea and types of masks and machines for OSA. Includes a review of the use of types of environmental controls: home humidity, furnaces, filters, dust mite/pet prevention, HEPA vacuums. Discussion about weather changes, air quality and the benefits of nasal washing. Instruction on Warning signs, infection symptoms, calling MD promptly, preventive modes, and value of vaccinations. Review of effective airway clearance, coughing and/or vibration techniques. Emphasizing that all should Create an Action Plan. Written material given at graduation. Flowsheet Row Pulmonary Rehab from 12/05/2022 in [facility] Cardiac and Pulmonary Rehab  Education need identified 12/05/22       AED/CPR: - Group verbal and written instruction with the use of models to demonstrate the basic use of the AED with the basic ABC's of resuscitation.    Anatomy and Cardiac Procedures: - Group verbal and visual presentation and models provide information about basic cardiac anatomy and function. Reviews the testing methods done to diagnose heart disease and the outcomes of the test results. Describes the treatment choices: Medical Management, Angioplasty, or Coronary Bypass Surgery for treating various heart conditions including Myocardial Infarction, Angina, Valve Disease, and Cardiac Arrhythmias.  Written material given at graduation.   Medication Safety: - Group verbal and visual instruction to review commonly prescribed medications for heart and lung disease. Reviews the medication, class of the drug, and side effects. Includes the  steps to properly store meds and maintain the prescription regimen.  Written material given at graduation.   Other: -Provides group and verbal instruction on various topics (see comments)   Knowledge Questionnaire Score:  Knowledge Questionnaire Score - 12/05/22 1617       Knowledge Questionnaire Score   Pre Score 16/18              Core Components/Risk Factors/Patient Goals at Admission:  Personal Goals and Risk Factors at Admission - 12/05/22 1633       Core Components/Risk Factors/Patient Goals on Admission    Weight Management Yes;Weight Maintenance   Already lost 14 lbs since hospital visit   Intervention Weight Management: Develop a combined nutrition and exercise program designed to reach desired caloric intake, while maintaining appropriate intake of nutrient and fiber, sodium and fats, and appropriate energy expenditure required for the weight goal.;Weight Management: Provide education and appropriate resources to help participant work on and attain dietary goals.;Weight Management/Obesity: Establish reasonable short term and long term weight goals.    Admit Weight 180 lb (81.6 kg)    Goal Weight: Short Term 180 lb (81.6 kg)    Goal Weight: Long Term 180 lb (81.6 kg)    Expected Outcomes Short Term: Continue to assess and modify interventions until short term weight is achieved;Long Term: Adherence to nutrition and physical activity/exercise program aimed toward attainment of established weight goal;Weight Maintenance: Understanding of the daily nutrition guidelines, which includes 25-35% calories from fat, 7% or less cal from saturated fats, less than 200mg  cholesterol, less than 1.5gm of sodium, & 5 or more servings of fruits and vegetables daily;Understanding recommendations for meals to include 15-35% energy as protein, 25-35% energy from fat, 35-60% energy from carbohydrates, less than 200mg  of dietary cholesterol, 20-35 gm of total fiber daily;Understanding of  distribution of calorie intake throughout the day with the consumption of 4-5 meals/snacks    Improve shortness of breath with ADL's Yes    Intervention Provide education, individualized exercise plan and daily activity instruction to help decrease symptoms of SOB with activities of daily living.    Expected Outcomes Short Term: Improve cardiorespiratory fitness to achieve a reduction of symptoms when performing ADLs;Long Term: Be able to perform more ADLs without symptoms or delay the onset of symptoms    Hypertension Yes    Intervention Provide education on lifestyle modifcations including regular physical activity/exercise, weight management, moderate sodium restriction and increased consumption of fresh fruit, vegetables, and low fat dairy, alcohol moderation, and smoking cessation.;Monitor prescription use compliance.    Expected Outcomes Short Term: Continued assessment and intervention until BP is < 140/55mm HG in hypertensive participants. < 130/48mm HG in hypertensive participants with diabetes, heart failure or chronic kidney disease.;Long Term: Maintenance of blood pressure at goal levels.    Lipids Yes    Intervention Provide education and support for participant on nutrition & aerobic/resistive exercise along with prescribed medications to achieve LDL 70mg , HDL >40mg .    Expected Outcomes Short Term: Participant states understanding of desired cholesterol values and is compliant with medications prescribed. Participant is following exercise prescription and nutrition guidelines.;Long Term: Cholesterol controlled with medications as prescribed, with individualized exercise RX and with personalized nutrition plan. Value goals: LDL < 70mg , HDL > 40 mg.             Education:Diabetes - Individual verbal and written instruction to review signs/symptoms of diabetes, desired ranges of glucose level fasting, after meals and with exercise. Acknowledge that pre and post exercise  glucose checks  will be done for 3 sessions at entry of program.   Know Your Numbers and Heart Failure: - Group verbal and visual instruction to discuss disease risk factors for cardiac and pulmonary disease and treatment options.  Reviews associated critical values for Overweight/Obesity, Hypertension, Cholesterol, and Diabetes.  Discusses basics of heart failure: signs/symptoms and treatments.  Introduces Heart Failure Zone chart for action plan for heart failure.  Written material given at graduation.   Core Components/Risk Factors/Patient Goals Review:   Goals and Risk Factor Review     Row Name 02/25/23 0757 05/13/23 0837           Core Components/Risk Factors/Patient Goals Review   Personal Goals Review Hypertension;Lipids Hypertension;Improve shortness of breath with ADL's      Review Patient reports that he consistently takes all BP and cholesterol meds and has a good routine with his medications and is currently not having any problems with meds. He checks his BP every day at home 2 times a day. He just got a new BP machine. Annette Stable continues to check his BP and reports his reading have been within normal ranges. He has been taking all his medications as prescribed. He also reports that his shortness of breath has improved with ADLs as he states he previously could not walk to the mailbox without O2. Now he is able to not be on O2 while doing activities at home, although he states he does not leave the house without his O2.      Expected Outcomes Short: continue to monitor BP at home and take all meds as prescribed. Long: continue to control risk factors with meds and healthy lifestyle. Short: Continue to monitor BP at home and take all meds as prescribed. Long: Continue to monitor lifestyle risk factors.               Core Components/Risk Factors/Patient Goals at Discharge (Final Review):   Goals and Risk Factor Review - 05/13/23 0837       Core Components/Risk Factors/Patient Goals Review    Personal Goals Review Hypertension;Improve shortness of breath with ADL's    Review Annette Stable continues to check his BP and reports his reading have been within normal ranges. He has been taking all his medications as prescribed. He also reports that his shortness of breath has improved with ADLs as he states he previously could not walk to the mailbox without O2. Now he is able to not be on O2 while doing activities at home, although he states he does not leave the house without his O2.    Expected Outcomes Short: Continue to monitor BP at home and take all meds as prescribed. Long: Continue to monitor lifestyle risk factors.             ITP Comments:  ITP Comments     Row Name 12/03/22 1037 12/05/22 1612 12/09/22 1242 12/19/22 1117 12/25/22 0759   ITP Comments Initial phone call completed. Diagnosis can be found in [facility] 4/11. EP Orientation scheduled for Thursday 4/25 at 2pm. Completed and gym orientation. Initial ITP created and sent for review to Dr. Vida Rigger, Medical Director. Per Dr. Karna Christmas, pause pulmonary rehab appointments- would like to order a holter monitor and be evaluated and cleared to go from a cardiac stand point until he returns. Pt was tachycardic at orient. Left message for patient and wife to call back to discuss appts. They saw Dr. Roberts Gaudy nurse on 4/26. Called and spoke with patient's wife-  patient is due to see a cardiologist on 6/3 to follow up on holter monitor results. Patient had a holter orderd per Dr. Karna Christmas after he was notified that patient had elevated resting HR at pulmonary rehab orientation. Patient needs clearance from cardiologist and Dr. Karna Christmas before starting pulmonary rehab. Wife and patient aware they need written clearance from both MDs. Wife and patient will call us after cardiology appt to keep [place] updated. Placing patient on medical hold at this time before starting rehab. 30 Day review completed. Medical Director ITP review done, changes made as  directed, and signed approval by Medical Director.   remains on medical hold    Row Name 01/16/23 1417 01/22/23 0725 02/03/23 1618 02/18/23 0814 02/18/23 1356   ITP Comments Called pt to follow up from MD appt for clearance to start rehab.  Note show no findings and needing rehab.  Left message for patient. Pt has not yet started rehab. 30 Day review completed. Medical Director ITP review done, changes made as directed, and signed approval by Medical Director.   waiting to start after medical clearance Called and spoke with pt. He has been cleared by cardiology to begin pulmonary rehab. Pt will start rehab 02/18/23 at 7:15 am. First full day of exercise!  Patient was oriented to gym and equipment including functions, settings, policies, and procedures.  Patient's individual exercise prescription and treatment plan were reviewed.  All starting workloads were established based on the results of the 6 minute walk test done at initial orientation visit.  The plan for exercise progression was also introduced and progression will be customized based on patient's performance and goals. 30 Day review completed. Medical Director ITP review done, changes made as directed, and signed approval by Medical Director.   new to program    Row Name 03/19/23 1118 04/16/23 0929 05/14/23 0953       ITP Comments 30 Day review completed. Medical Director ITP review done, changes made as directed, and signed approval by Medical Director. 30 Day review completed. Medical Director ITP review done, changes made as directed, and signed approval by Medical Director. 30 Day review completed. Medical Director ITP review done, changes made as directed, and signed approval by Medical Director.              Comments:

## 2023-05-15 ENCOUNTER — Encounter: Payer: Medicare HMO | Admitting: *Deleted

## 2023-05-15 DIAGNOSIS — J449 Chronic obstructive pulmonary disease, unspecified: Secondary | ICD-10-CM | POA: Diagnosis not present

## 2023-05-15 DIAGNOSIS — Z5189 Encounter for other specified aftercare: Secondary | ICD-10-CM | POA: Diagnosis not present

## 2023-05-15 NOTE — Progress Notes (Signed)
Daily Session Note  Patient Details  Name: Gregory Haynes MRN: [id number] Date of Birth: 31-Aug-1940 Referring Provider:    Encounter Date: 05/15/2023  Check In:  Session Check In - 05/15/23 0752       Check-In   Supervising physician immediately available to respond to emergencies See telemetry face sheet for immediately available ER MD    Location [facility]    Staff Present Elige Ko, RCP,RRT,BSRT;Maxon Conetta BS, , Exercise Physiologist;Rashika Bettes Katrinka Blazing, RN, ADN    Virtual Visit No    Medication changes reported     No    Fall or balance concerns reported    No    Warm-up and Cool-down Performed on first and last piece of equipment    Resistance Training Performed Yes    VAD Patient? No    PAD/SET Patient? No      Pain Assessment   Currently in Pain? No/denies                Social History   Tobacco Use  Smoking Status Former   Current packs/day: 0.00   Average packs/day: 1.5 packs/day for 55.0 years (82.5 ttl pk-yrs)   Types: Cigarettes   Start date: 08/12/1956   Quit date: 08/13/2011   Years since quitting: 11.7  Smokeless Tobacco Never    Goals Met:  Independence with exercise equipment Exercise tolerated well No report of concerns or symptoms today Strength training completed today  Goals Unmet:  Not Applicable  Comments: Pt able to follow exercise prescription today without complaint.  Will continue to monitor for progression.    Dr. Bethann Punches is Medical Director for [facility] Cardiac Rehabilitation.  Dr. Vida Rigger is Medical Director for [facility] Pulmonary Rehabilitation.

## 2023-05-20 ENCOUNTER — Encounter: Payer: Medicare HMO | Admitting: *Deleted

## 2023-05-20 DIAGNOSIS — J449 Chronic obstructive pulmonary disease, unspecified: Secondary | ICD-10-CM

## 2023-05-20 DIAGNOSIS — Z5189 Encounter for other specified aftercare: Secondary | ICD-10-CM | POA: Diagnosis not present

## 2023-05-20 NOTE — Progress Notes (Signed)
Daily Session Note  Patient Details  Name: Gregory Haynes MRN: [id number] Date of Birth: June 01, 1941 Referring Provider:    Encounter Date: 05/20/2023  Check In:  Session Check In - 05/20/23 0818       Check-In   Supervising physician immediately available to respond to emergencies See telemetry face sheet for immediately available ER MD    Location [facility]    Staff Present Rory Percy, MS, Exercise Physiologist;Noah Tickle, BS, Exercise Physiologist;Kiran Carline, RN, BSN, CCRP    Virtual Visit No    Medication changes reported     No    Fall or balance concerns reported    No    Warm-up and Cool-down Performed on first and last piece of equipment    Resistance Training Performed Yes    VAD Patient? No    PAD/SET Patient? No      Pain Assessment   Currently in Pain? No/denies                Social History   Tobacco Use  Smoking Status Former   Current packs/day: 0.00   Average packs/day: 1.5 packs/day for 55.0 years (82.5 ttl pk-yrs)   Types: Cigarettes   Start date: 08/12/1956   Quit date: 08/13/2011   Years since quitting: 11.7  Smokeless Tobacco Never    Goals Met:  Independence with exercise equipment Exercise tolerated well No report of concerns or symptoms today  Goals Unmet:  Not Applicable  Comments: Pt able to follow exercise prescription today without complaint.  Will continue to monitor for progression.    Dr. Bethann Punches is Medical Director for [facility] Cardiac Rehabilitation.  Dr. Vida Rigger is Medical Director for [facility] Pulmonary Rehabilitation.

## 2023-05-21 DIAGNOSIS — E7800 Pure hypercholesterolemia, unspecified: Secondary | ICD-10-CM | POA: Diagnosis not present

## 2023-05-21 DIAGNOSIS — I25118 Atherosclerotic heart disease of native coronary artery with other forms of angina pectoris: Secondary | ICD-10-CM | POA: Diagnosis not present

## 2023-05-21 DIAGNOSIS — J449 Chronic obstructive pulmonary disease, unspecified: Secondary | ICD-10-CM | POA: Diagnosis not present

## 2023-05-21 DIAGNOSIS — Z0000 Encounter for general adult medical examination without abnormal findings: Secondary | ICD-10-CM | POA: Diagnosis not present

## 2023-05-21 DIAGNOSIS — I10 Essential (primary) hypertension: Secondary | ICD-10-CM | POA: Diagnosis not present

## 2023-05-21 DIAGNOSIS — R7303 Prediabetes: Secondary | ICD-10-CM | POA: Diagnosis not present

## 2023-05-21 DIAGNOSIS — Z23 Encounter for immunization: Secondary | ICD-10-CM | POA: Diagnosis not present

## 2023-05-21 DIAGNOSIS — Z1331 Encounter for screening for depression: Secondary | ICD-10-CM | POA: Diagnosis not present

## 2023-05-21 DIAGNOSIS — J9612 Chronic respiratory failure with hypercapnia: Secondary | ICD-10-CM | POA: Diagnosis not present

## 2023-05-22 ENCOUNTER — Encounter: Payer: Medicare HMO | Admitting: *Deleted

## 2023-05-22 DIAGNOSIS — J449 Chronic obstructive pulmonary disease, unspecified: Secondary | ICD-10-CM

## 2023-05-22 DIAGNOSIS — Z5189 Encounter for other specified aftercare: Secondary | ICD-10-CM | POA: Diagnosis not present

## 2023-05-22 NOTE — Progress Notes (Signed)
Daily Session Note  Patient Details  Name: CARVELL HOEFFNER MRN: [id number] Date of Birth: 08/11/1941 Referring Provider:    Encounter Date: 05/22/2023  Check In:  Session Check In - 05/22/23 0756       Check-In   Supervising physician immediately available to respond to emergencies See telemetry face sheet for immediately available ER MD    Location [facility]    Staff Present Ronette Deter, BS, Exercise Physiologist;Joseph [place], RCP,RRT,BSRT;Maxon [place] BS, , Exercise Physiologist;Merdith Boyd Katrinka Blazing, RN, [place]    Virtual Visit No    Medication changes reported     No    Fall or balance concerns reported    No    Warm-up and Cool-down Performed on first and last piece of equipment    Resistance Training Performed Yes    VAD Patient? No    PAD/SET Patient? No      Pain Assessment   Currently in Pain? No/denies                Social History   Tobacco Use  Smoking Status Former   Current packs/day: 0.00   Average packs/day: 1.5 packs/day for 55.0 years (82.5 ttl pk-yrs)   Types: Cigarettes   Start date: 08/12/1956   Quit date: 08/13/2011   Years since quitting: 11.7  Smokeless Tobacco Never    Goals Met:  Independence with exercise equipment Exercise tolerated well No report of concerns or symptoms today Strength training completed today  Goals Unmet:  Not Applicable  Comments: Pt able to follow exercise prescription today without complaint.  Will continue to monitor for progression.    Dr. Bethann Punches is Medical Director for [facility] Cardiac Rehabilitation.  Dr. Vida Rigger is Medical Director for [facility] Pulmonary Rehabilitation.

## 2023-05-27 ENCOUNTER — Encounter: Payer: Medicare HMO | Admitting: *Deleted

## 2023-05-27 DIAGNOSIS — J449 Chronic obstructive pulmonary disease, unspecified: Secondary | ICD-10-CM

## 2023-05-27 DIAGNOSIS — Z5189 Encounter for other specified aftercare: Secondary | ICD-10-CM | POA: Diagnosis not present

## 2023-05-27 NOTE — Progress Notes (Signed)
Daily Session Note  Patient Details  Name: KIARA KEEP MRN: [id number] Date of Birth: 1941-01-30 Referring Provider:    Encounter Date: 05/27/2023  Check In:  Session Check In - 05/27/23 0800       Check-In   Supervising physician immediately available to respond to emergencies See telemetry face sheet for immediately available ER MD    Location [facility]    Staff Present Cora Collum, RN, BSN, CCRP;Margaret Best, MS, Exercise Physiologist;Noah Tickle, BS, Exercise Physiologist;Maxon Conetta BS, , Exercise Physiologist    Virtual Visit No    Medication changes reported     No    Fall or balance concerns reported    No    Warm-up and Cool-down Performed on first and last piece of equipment    Resistance Training Performed Yes    VAD Patient? No    PAD/SET Patient? No      Pain Assessment   Currently in Pain? No/denies                Social History   Tobacco Use  Smoking Status Former   Current packs/day: 0.00   Average packs/day: 1.5 packs/day for 55.0 years (82.5 ttl pk-yrs)   Types: Cigarettes   Start date: 08/12/1956   Quit date: 08/13/2011   Years since quitting: 11.7  Smokeless Tobacco Never    Goals Met:  Proper associated with RPD/PD & O2 Sat Independence with exercise equipment Exercise tolerated well No report of concerns or symptoms today  Goals Unmet:  Not Applicable  Comments: Pt able to follow exercise prescription today without complaint.  Will continue to monitor for progression.    Dr. Bethann Punches is Medical Director for [facility] Cardiac Rehabilitation.  Dr. Vida Rigger is Medical Director for [facility] Pulmonary Rehabilitation.

## 2023-05-29 ENCOUNTER — Encounter: Payer: Medicare HMO | Admitting: *Deleted

## 2023-05-29 DIAGNOSIS — J449 Chronic obstructive pulmonary disease, unspecified: Secondary | ICD-10-CM | POA: Diagnosis not present

## 2023-05-29 DIAGNOSIS — Z5189 Encounter for other specified aftercare: Secondary | ICD-10-CM | POA: Diagnosis not present

## 2023-05-29 NOTE — Progress Notes (Signed)
Daily Session Note  Patient Details  Name: CARL BUTNER MRN: [id number] Date of Birth: June 07, 1941 Referring Provider:    Encounter Date: 05/29/2023  Check In:  Session Check In - 05/29/23 0735       Check-In   Supervising physician immediately available to respond to emergencies See telemetry face sheet for immediately available ER MD    Location [facility]    Staff Present Cora Collum, RN, BSN, CCRP;Joseph Hood, RCP,RRT,BSRT;Maxon [place] BS, , Exercise Physiologist;Ethelyne Erich Katrinka Blazing, RN, [place]    Virtual Visit No    Medication changes reported     No    Fall or balance concerns reported    No    Warm-up and Cool-down Performed on first and last piece of equipment    Resistance Training Performed Yes    VAD Patient? No    PAD/SET Patient? No      Pain Assessment   Currently in Pain? No/denies                Social History   Tobacco Use  Smoking Status Former   Current packs/day: 0.00   Average packs/day: 1.5 packs/day for 55.0 years (82.5 ttl pk-yrs)   Types: Cigarettes   Start date: 08/12/1956   Quit date: 08/13/2011   Years since quitting: 11.8  Smokeless Tobacco Never    Goals Met:  Independence with exercise equipment Exercise tolerated well No report of concerns or symptoms today Strength training completed today  Goals Unmet:  Not Applicable  Comments: Pt able to follow exercise prescription today without complaint.  Will continue to monitor for progression.    Dr. Bethann Punches is Medical Director for [facility] Cardiac Rehabilitation.  Dr. Vida Rigger is Medical Director for [facility] Pulmonary Rehabilitation.

## 2023-06-03 ENCOUNTER — Encounter: Payer: Medicare HMO | Admitting: *Deleted

## 2023-06-03 DIAGNOSIS — J449 Chronic obstructive pulmonary disease, unspecified: Secondary | ICD-10-CM | POA: Diagnosis not present

## 2023-06-03 DIAGNOSIS — Z5189 Encounter for other specified aftercare: Secondary | ICD-10-CM | POA: Diagnosis not present

## 2023-06-03 NOTE — Progress Notes (Signed)
Daily Session Note  Patient Details  Name: Gregory Haynes MRN: [id number] Date of Birth: September 13, 1940 Referring Provider:    Encounter Date: 06/03/2023  Check In:  Session Check In - 06/03/23 0818       Check-In   Supervising physician immediately available to respond to emergencies See telemetry face sheet for immediately available ER MD    Location [facility]    Staff Present Cora Collum, RN, BSN, CCRP;Margaret Best, MS, Exercise Physiologist;Maxon Conetta BS, , Exercise Physiologist;Noah Tickle, BS, Exercise Physiologist    Virtual Visit No    Medication changes reported     No    Fall or balance concerns reported    No    Warm-up and Cool-down Performed on first and last piece of equipment    Resistance Training Performed Yes    VAD Patient? No    PAD/SET Patient? No      Pain Assessment   Currently in Pain? No/denies                Social History   Tobacco Use  Smoking Status Former   Current packs/day: 0.00   Average packs/day: 1.5 packs/day for 55.0 years (82.5 ttl pk-yrs)   Types: Cigarettes   Start date: 08/12/1956   Quit date: 08/13/2011   Years since quitting: 11.8  Smokeless Tobacco Never    Goals Met:  Proper associated with RPD/PD & O2 Sat Independence with exercise equipment Exercise tolerated well No report of concerns or symptoms today  Goals Unmet:  Not Applicable  Comments: Pt able to follow exercise prescription today without complaint.  Will continue to monitor for progression.    Dr. Bethann Punches is Medical Director for [facility] Cardiac Rehabilitation.  Dr. Vida Rigger is Medical Director for [facility] Pulmonary Rehabilitation.

## 2023-06-05 ENCOUNTER — Encounter: Payer: Medicare HMO | Admitting: *Deleted

## 2023-06-05 DIAGNOSIS — Z5189 Encounter for other specified aftercare: Secondary | ICD-10-CM | POA: Diagnosis not present

## 2023-06-05 DIAGNOSIS — J449 Chronic obstructive pulmonary disease, unspecified: Secondary | ICD-10-CM

## 2023-06-05 NOTE — Progress Notes (Signed)
Daily Session Note  Patient Details  Name: Gregory Haynes MRN: [id number] Date of Birth: 22-Jan-1941 Referring Provider:    Encounter Date: 06/05/2023  Check In:  Session Check In - 06/05/23 0740       Check-In   Supervising physician immediately available to respond to emergencies See telemetry face sheet for immediately available ER MD    Location [facility]    Staff Present Cora Collum, RN, BSN, CCRP;Joseph Hood, RCP,RRT,BSRT;Maxon [place] BS, , Exercise Physiologist;Dabney Schanz Katrinka Blazing, RN, [place]    Virtual Visit No    Medication changes reported     No    Fall or balance concerns reported    No    Warm-up and Cool-down Performed on first and last piece of equipment    Resistance Training Performed Yes    VAD Patient? No    PAD/SET Patient? No      Pain Assessment   Currently in Pain? No/denies                Social History   Tobacco Use  Smoking Status Former   Current packs/day: 0.00   Average packs/day: 1.5 packs/day for 55.0 years (82.5 ttl pk-yrs)   Types: Cigarettes   Start date: 08/12/1956   Quit date: 08/13/2011   Years since quitting: 11.8  Smokeless Tobacco Never    Goals Met:  Independence with exercise equipment Exercise tolerated well No report of concerns or symptoms today Strength training completed today  Goals Unmet:  Not Applicable  Comments: Pt able to follow exercise prescription today without complaint.  Will continue to monitor for progression.    Dr. Bethann Punches is Medical Director for [facility] Cardiac Rehabilitation.  Dr. Vida Rigger is Medical Director for [facility] Pulmonary Rehabilitation.

## 2023-06-06 DIAGNOSIS — J449 Chronic obstructive pulmonary disease, unspecified: Secondary | ICD-10-CM | POA: Diagnosis not present

## 2023-06-06 DIAGNOSIS — J432 Centrilobular emphysema: Secondary | ICD-10-CM | POA: Diagnosis not present

## 2023-06-10 ENCOUNTER — Encounter: Payer: Medicare HMO | Admitting: *Deleted

## 2023-06-10 DIAGNOSIS — Z5189 Encounter for other specified aftercare: Secondary | ICD-10-CM | POA: Diagnosis not present

## 2023-06-10 DIAGNOSIS — J449 Chronic obstructive pulmonary disease, unspecified: Secondary | ICD-10-CM | POA: Diagnosis not present

## 2023-06-10 NOTE — Progress Notes (Signed)
Daily Session Note  Patient Details  Name: Gregory Haynes MRN: [id number] Date of Birth: 1941-01-25 Referring Provider:    Encounter Date: 06/10/2023  Check In:  Session Check In - 06/10/23 0754       Check-In   Supervising physician immediately available to respond to emergencies See telemetry face sheet for immediately available ER MD    Location [facility]    Staff Present Cora Collum, RN, BSN, CCRP;Margaret Best, MS, Exercise Physiologist;Noah Tickle, BS, Exercise Physiologist;Maxon Conetta BS, , Exercise Physiologist    Virtual Visit No    Medication changes reported     No    Fall or balance concerns reported    No    Warm-up and Cool-down Performed on first and last piece of equipment    Resistance Training Performed Yes    VAD Patient? No    PAD/SET Patient? No      Pain Assessment   Currently in Pain? No/denies                Social History   Tobacco Use  Smoking Status Former   Current packs/day: 0.00   Average packs/day: 1.5 packs/day for 55.0 years (82.5 ttl pk-yrs)   Types: Cigarettes   Start date: 08/12/1956   Quit date: 08/13/2011   Years since quitting: 11.8  Smokeless Tobacco Never    Goals Met:  Proper associated with RPD/PD & O2 Sat Independence with exercise equipment Exercise tolerated well No report of concerns or symptoms today  Goals Unmet:  Not Applicable  Comments: Pt able to follow exercise prescription today without complaint.  Will continue to monitor for progression.    Dr. Bethann Punches is Medical Director for [facility] Cardiac Rehabilitation.  Dr. Vida Rigger is Medical Director for [facility] Pulmonary Rehabilitation.

## 2023-06-11 ENCOUNTER — Encounter: Payer: Self-pay | Admitting: *Deleted

## 2023-06-11 DIAGNOSIS — J449 Chronic obstructive pulmonary disease, unspecified: Secondary | ICD-10-CM

## 2023-06-11 NOTE — Progress Notes (Signed)
Pulmonary Individual Treatment Plan  Patient Details  Name: Gregory Haynes MRN: [id number] Date of Birth: 08-04-1941 Referring Provider:    Initial Encounter Date:  Flowsheet Row Pulmonary Rehab from 12/05/2022 in [facility] Cardiac and Pulmonary Rehab  Date 12/05/22       Visit Diagnosis: Chronic obstructive pulmonary disease, unspecified COPD type (HCC)  Patient's Home Medications on Admission:  Current Outpatient Medications:    acidophilus (RISAQUAD) CAPS capsule, Take 1 capsule by mouth daily., Disp: , Rfl:    albuterol (VENTOLIN HFA) 108 (90 Base) MCG/ACT inhaler, Inhale 1-2 puffs into the lungs every 4 (four) hours as needed for wheezing or shortness of breath., Disp: 18 g, Rfl: 3   atorvastatin (LIPITOR) 40 MG tablet, Take 1 tablet (40 mg total) by mouth every evening., Disp: 90 tablet, Rfl: 3   carvedilol (COREG) 3.125 MG tablet, Take 1 tablet (3.125 mg total) by mouth 2 (two) times daily with a meal., Disp: 60 tablet, Rfl: 1   clobetasol cream (TEMOVATE) 0.05 %, Apply 1 Application topically 2 (two) times daily., Disp: , Rfl:    feeding supplement (ENSURE ENLIVE / ENSURE PLUS) LIQD, Take 237 mLs by mouth 3 (three) times daily. (Patient not taking: Reported on 12/03/2022), Disp: 237 mL, Rfl: 12   Fluticasone-Umeclidin-Vilant (TRELEGY ELLIPTA) 100-62.5-25 MCG/INH AEPB, Inhale 1 puff into the lungs daily., Disp: , Rfl:    folic acid (FOLVITE) 1 MG tablet, Take 1 mg by mouth daily. Unsure dose (Patient not taking: Reported on 12/03/2022), Disp: , Rfl:    ipratropium-albuterol (DUONEB) 0.5-2.5 (3) MG/3ML SOLN, Inhale into the lungs., Disp: , Rfl:    lisinopril (ZESTRIL) 2.5 MG tablet, Take 1 tablet (2.5 mg total) by mouth daily., Disp: 30 tablet, Rfl: 0   OXYGEN, Inhale 2 L/hr into the lungs. At night and as needed during day., Disp: , Rfl:   Past Medical History: Past Medical History:  Diagnosis Date   Anemia    Cancer (HCC)    BCC   Cataract    COPD (chronic obstructive pulmonary  disease) (HCC)    Helicobacter pylori ab+    Hyperlipidemia    Hypertension    Psoriasis    Wears hearing aid in both ears     Tobacco Use: Social History   Tobacco Use  Smoking Status Former   Current packs/day: 0.00   Average packs/day: 1.5 packs/day for 55.0 years (82.5 ttl pk-yrs)   Types: Cigarettes   Start date: 08/12/1956   Quit date: 08/13/2011   Years since quitting: 11.8  Smokeless Tobacco Never    Labs: Review Flowsheet  More data exists      Latest Ref Rng & Units 05/17/2020 05/01/2022 10/31/2022 11/01/2022 11/02/2022  Labs for ITP Cardiac and Pulmonary Rehab  Cholestrol 100 - 199 mg/dL 086  578  - - -  LDL (calc) 0 - 99 mg/dL 79  70  - - -  HDL-C >46 mg/dL 48  49  - - -  Trlycerides 0 - 149 mg/dL 76  84  - - -  Hemoglobin A1c 4.8 - 5.6 % - - 6.3  - -  PH, Arterial 7.35 - 7.45 - - 7.38  7.36  7.43  7.47   PCO2 arterial 32 - 48 mmHg - - 71  80  69  62   Bicarbonate 20.0 - 28.0 mmol/L - - 48.4  42.0  45.2  45.6  45.8  45.1   O2 Saturation % - - 67  98.3  95.3  94.7  98.9  99.2     Details       Multiple values from one day are sorted in reverse-chronological order          Pulmonary Assessment Scores:   UCSD: Self-administered rating of dyspnea associated with activities of daily living (ADLs) 6-point scale (0 = "not at all" to 5 = "maximal or unable to do because of breathlessness")  Scoring Scores range from 0 to 120.  Minimally important difference is 5 units  CAT: CAT can identify the health impairment of COPD patients and is better correlated with disease progression.  CAT has a scoring range of zero to 40. The CAT score is classified into four groups of low (less than 10), medium (10 - 20), high (21-30) and very high (31-40) based on the impact level of disease on health status. A CAT score over 10 suggests significant symptoms.  A worsening CAT score could be explained by an exacerbation, poor medication adherence, poor inhaler technique, or  progression of COPD or comorbid conditions.  CAT MCID is 2 points  mMRC: mMRC (Modified Medical Research Council) Dyspnea Scale is used to assess the degree of baseline functional disability in patients of respiratory disease due to dyspnea. No minimal important difference is established. A decrease in score of 1 point or greater is considered a positive change.   Pulmonary Function Assessment:   Exercise Target Goals: Exercise Program Goal: Individual exercise prescription set using results from initial 6 min walk test and THRR while considering  patient's activity barriers and safety.   Exercise Prescription Goal: Initial exercise prescription builds to 30-45 minutes a day of aerobic activity, 2-3 days per week.  Home exercise guidelines will be given to patient during program as part of exercise prescription that the participant will acknowledge.  Education: Aerobic Exercise: - Group verbal and visual presentation on the components of exercise prescription. Introduces F.I.T.T principle from ACSM for exercise prescriptions.  Reviews F.I.T.T. principles of aerobic exercise including progression. Written material given at graduation.   Education: Resistance Exercise: - Group verbal and visual presentation on the components of exercise prescription. Introduces F.I.T.T principle from ACSM for exercise prescriptions  Reviews F.I.T.T. principles of resistance exercise including progression. Written material given at graduation.    Education: Exercise & Equipment Safety: - Individual verbal instruction and demonstration of equipment use and safety with use of the equipment. Flowsheet Row Pulmonary Rehab from 12/05/2022 in [facility] Cardiac and Pulmonary Rehab  Education need identified 12/05/22  Date 12/05/22  Educator KW  Instruction Review Code 1- Verbalizes Understanding       Education: Exercise Physiology & General Exercise Guidelines: - Group verbal and written instruction with models  to review the exercise physiology of the cardiovascular system and associated critical values. Provides general exercise guidelines with specific guidelines to those with heart or lung disease.    Education: Flexibility, Balance, Mind/Body Relaxation: - Group verbal and visual presentation with interactive activity on the components of exercise prescription. Introduces F.I.T.T principle from ACSM for exercise prescriptions. Reviews F.I.T.T. principles of flexibility and balance exercise training including progression. Also discusses the mind body connection.  Reviews various relaxation techniques to help reduce and manage stress (i.e. Deep breathing, progressive muscle relaxation, and visualization). Balance handout provided to take home. Written material given at graduation.   Activity Barriers & Risk Stratification:   6 Minute Walk:  Oxygen Initial Assessment:   Oxygen Re-Evaluation:  Oxygen Re-Evaluation     Row Name 02/18/23 0815 02/25/23 0741 05/13/23 0844  05/29/23 0741       Program Oxygen Prescription   Program Oxygen Prescription Continuous Continuous Continuous Continuous    Liters per minute 2 2 2 2       Home Oxygen   Home Oxygen Device Portable Concentrator;Home Concentrator Portable Concentrator;Home Concentrator Portable Concentrator;Home Concentrator Portable Concentrator;Home Concentrator;E-Tanks    Sleep Oxygen Prescription Continuous;CPAP Continuous;CPAP Continuous;CPAP Continuous;CPAP    Liters per minute 2 2 2 2     Home Exercise Oxygen Prescription Continuous Continuous Continuous Continuous    Liters per minute 2 2 2 2     Home Resting Oxygen Prescription Continuous Continuous Continuous Continuous    Liters per minute 2 2 2 2     Compliance with Home Oxygen Use Yes Yes Yes Yes      Goals/Expected Outcomes   Short Term Goals To learn and demonstrate proper pursed lip breathing techniques or other breathing techniques.  To learn and demonstrate proper pursed lip  breathing techniques or other breathing techniques. ;To learn and exhibit compliance with exercise, home and travel O2 prescription;To learn and understand importance of monitoring SPO2 with pulse oximeter and demonstrate accurate use of the pulse oximeter.;To learn and understand importance of maintaining oxygen saturations>88%;To learn and demonstrate proper use of respiratory medications To learn and demonstrate proper pursed lip breathing techniques or other breathing techniques. ;To learn and exhibit compliance with exercise, home and travel O2 prescription;To learn and understand importance of monitoring SPO2 with pulse oximeter and demonstrate accurate use of the pulse oximeter.;To learn and understand importance of maintaining oxygen saturations>88%;To learn and demonstrate proper use of respiratory medications To learn and understand importance of maintaining oxygen saturations>88%;To learn and understand importance of monitoring SPO2 with pulse oximeter and demonstrate accurate use of the pulse oximeter.    Long  Term Goals Exhibits proper breathing techniques, such as pursed lip breathing or other method taught during program session Exhibits compliance with exercise, home  and travel O2 prescription;Verbalizes importance of monitoring SPO2 with pulse oximeter and return demonstration;Maintenance of O2 saturations>88%;Exhibits proper breathing techniques, such as pursed lip breathing or other method taught during program session;Compliance with respiratory medication Exhibits compliance with exercise, home  and travel O2 prescription;Verbalizes importance of monitoring SPO2 with pulse oximeter and return demonstration;Maintenance of O2 saturations>88%;Exhibits proper breathing techniques, such as pursed lip breathing or other method taught during program session;Compliance with respiratory medication Maintenance of O2 saturations>88%;Verbalizes importance of monitoring SPO2 with pulse oximeter and return  demonstration    Comments Reviewed PLB technique with pt.  Talked about how it works and it's importance in maintaining their exercise saturations. Patient reports compliance with home oxygen use and respiratory medications. He does have a pulse ox at home and consistently monitors SaO2 levels and reports that they ususally stay above 88%. He reports that if he gets SOB or notices decreases in SaO2 levels he feels comfortable using PLB. Reviewd PLB technique with patient today. Annette Stable states that he has continues to practice his PLB with exercise. He also has a pulse oximeter at home and his beeen checking his O2 saturations. He reports that his O2 occasionally drops below 90% during exercise but he states that it recovers quickly with rest. Bill has a pulse oximeter to check his oxygen saturation at home. Informed and explained why it is important to have one. Reviewed that oxygen saturations should be 88 percent and above. He keeps multiple pulse oximeters around the house and his car to keep track of his oxygen.    Goals/Expected Outcomes Short:  Become more profiecient at using PLB. Long: Become independent at using PLB. Short: continue to attend pulmonary rehab consistently to work on strength and lung function. Long: continue to use PLB independently Short: Continue to monitor O2 saturations at home. Long: Continue to use PLB independently Short: monitor oxygen at home with exertion. Long: maintain oxygen saturations above 88 percent independently.             Oxygen Discharge (Final Oxygen Re-Evaluation):  Oxygen Re-Evaluation - 05/29/23 0741       Program Oxygen Prescription   Program Oxygen Prescription Continuous    Liters per minute 2      Home Oxygen   Home Oxygen Device Portable Concentrator;Home Concentrator;E-Tanks    Sleep Oxygen Prescription Continuous;CPAP    Liters per minute 2    Home Exercise Oxygen Prescription Continuous    Liters per minute 2    Home Resting Oxygen  Prescription Continuous    Liters per minute 2    Compliance with Home Oxygen Use Yes      Goals/Expected Outcomes   Short Term Goals To learn and understand importance of maintaining oxygen saturations>88%;To learn and understand importance of monitoring SPO2 with pulse oximeter and demonstrate accurate use of the pulse oximeter.    Long  Term Goals Maintenance of O2 saturations>88%;Verbalizes importance of monitoring SPO2 with pulse oximeter and return demonstration    Comments Annette Stable has a pulse oximeter to check his oxygen saturation at home. Informed and explained why it is important to have one. Reviewed that oxygen saturations should be 88 percent and above. He keeps multiple pulse oximeters around the house and his car to keep track of his oxygen.    Goals/Expected Outcomes Short: monitor oxygen at home with exertion. Long: maintain oxygen saturations above 88 percent independently.             Initial Exercise Prescription:   Perform Capillary Blood Glucose checks as needed.  Exercise Prescription Changes:   Exercise Prescription Changes     Row Name 03/05/23 1000 03/19/23 1300 04/01/23 1400 04/03/23 0900 04/17/23 1100     Response to Exercise   Blood Pressure (Admit) 110/60 112/60 104/60 -- 102/60   Blood Pressure (Exercise) 142/66 140/70 142/60 -- 130/68   Blood Pressure (Exit) 106/62 110/62 108/58 -- 120/62   Heart Rate (Admit) 91 bpm 108 bpm 110 bpm -- 89 bpm   Heart Rate (Exercise) 122 bpm 124 bpm 127 bpm -- 111 bpm   Heart Rate (Exit) 114 bpm 116 bpm 123 bpm -- 106 bpm   Oxygen Saturation (Admit) 95 % 90 % 95 % -- 90 %   Oxygen Saturation (Exercise) 96 % 89 % 91 % -- 98 %   Oxygen Saturation (Exit) 94 % 95 % 96 % -- 98 %   Rating of Perceived Exertion (Exercise) 15 13 15  -- 11   Perceived Dyspnea (Exercise) 2 2 0 -- 1   Symptoms -- SOB SOB -- SOB   Duration -- -- Progress to 30 minutes of  aerobic without signs/symptoms of physical distress -- Progress to 30  minutes of  aerobic without signs/symptoms of physical distress   Intensity -- -- THRR unchanged -- THRR unchanged     Progression   Progression -- -- Continue to progress workloads to maintain intensity without signs/symptoms of physical distress. -- Continue to progress workloads to maintain intensity without signs/symptoms of physical distress.   Average METs -- 1.8 2.01 -- 2.09     Resistance Training  Training Prescription Yes Yes Yes -- Yes   Weight 4 lb 4 lb 4 lb -- 4 lb   Reps 10-15 10-15 10-15 -- 10-15     Interval Training   Interval Training -- -- No -- No     Oxygen   Oxygen Continuous Continuous Continuous -- Continuous   Liters 2 2 2  -- 2     Recumbant Bike   Level 2 2 -- -- --   Watts 18 18 -- -- --   Minutes 15 15 -- -- --   METs 2.68 2.69 -- -- --     NuStep   Level 3 -- -- -- 2   Minutes 15 -- -- -- 15   METs 2.1 -- -- -- 2.09     T5 Nustep   Level -- -- -- -- 2   Minutes -- -- -- -- 15   METs -- -- -- -- 1.8     Biostep-RELP   Level 1 2 3  -- --   SPM -- 50 -- -- --   Minutes 15 15 15  -- --   METs 2 2 2  -- --     Track   Laps 19 25 19  -- 28   Minutes 15 15 15  -- 15   METs 2.03 2.36 2.03 -- 2.52     Home Exercise Plan   Plans to continue exercise at -- -- -- Home (comment)  Walking at home around [facility] or at [employer] (comment)  Walking at home around [facility] or at [employer] -- -- -- Add 3 additional days to program exercise sessions. Add 3 additional days to program exercise sessions.   Initial Home Exercises Provided -- -- -- 04/03/23 04/03/23     Oxygen   Maintain Oxygen Saturation 88% or higher 88% or higher 88% or higher 88% or higher 88% or higher    Row Name 05/01/23 1600 05/15/23 1700 05/29/23 1400         Response to Exercise   Blood Pressure (Admit) 122/72 100/60 128/60     Blood Pressure (Exit) 122/68 132/76 114/72     Heart Rate (Admit) 82 bpm 103 bpm 90 bpm     Heart Rate (Exercise) 113 bpm  120 bpm 111 bpm     Heart Rate (Exit) 104 bpm 108 bpm 96 bpm     Oxygen Saturation (Admit) 88 % 95 % 92 %     Oxygen Saturation (Exercise) 87 % 90 % 87 %     Oxygen Saturation (Exit) 96 % 97 % 92 %     Rating of Perceived Exertion (Exercise) 13 12 12      Perceived Dyspnea (Exercise) 1 1 0     Symptoms SOB SOB SOB     Duration Progress to 30 minutes of  aerobic without signs/symptoms of physical distress Progress to 30 minutes of  aerobic without signs/symptoms of physical distress Progress to 30 minutes of  aerobic without signs/symptoms of physical distress     Intensity THRR unchanged THRR unchanged THRR unchanged       Progression   Progression Continue to progress workloads to maintain intensity without signs/symptoms of physical distress. Continue to progress workloads to maintain intensity without signs/symptoms of physical distress. Continue to progress workloads to maintain intensity without signs/symptoms of physical distress.     Average METs 2.28 2.3 2.47       Resistance Training   Training Prescription Yes Yes Yes  Weight 4 lb 4 lb 4 lb     Reps 10-15 10-15 10-15       Interval Training   Interval Training No No No       Oxygen   Oxygen Continuous Continuous Continuous     Liters 2 2 2        Recumbant Bike   Level 2 2 2      Watts 15 15 18      Minutes 15 15 15      METs 2.58 2.58 2.7       NuStep   Level 3 3 3      Minutes 15 15 15      METs 2.3 2.2 3       Track   Laps 24 28 25      Minutes 15 15 15      METs 2.31 2.52 2.36       Home Exercise Plan   Plans to continue exercise at Home (comment)  Walking at home around [facility] or at the mall Home (comment)  Walking at home around [facility] or at [employer] (comment)  Walking at home around [facility] or at [employer] Add 3 additional days to program exercise sessions. Add 3 additional days to program exercise sessions. Add 3 additional days to program exercise sessions.      Initial Home Exercises Provided 04/03/23 04/03/23 04/03/23       Oxygen   Maintain Oxygen Saturation 88% or higher 88% or higher 88% or higher              Exercise Comments:   Exercise Comments     Row Name 02/18/23 0272           Exercise Comments First full day of exercise!  Patient was oriented to gym and equipment including functions, settings, policies, and procedures.  Patient's individual exercise prescription and treatment plan were reviewed.  All starting workloads were established based on the results of the 6 minute walk test done at initial orientation visit.  The plan for exercise progression was also introduced and progression will be customized based on patient's performance and goals.                Exercise Goals and Review:   Exercise Goals Re-Evaluation :  Exercise Goals Re-Evaluation     Row Name 12/24/22 1453 02/18/23 0814 02/25/23 0737 03/05/23 1011 03/19/23 1332     Exercise Goal Re-Evaluation   Exercise Goals Review Increase Physical Activity;Understanding of Exercise Prescription;Increase Strength and Stamina Able to understand and use rate of perceived exertion (RPE) scale;Able to understand and use Dyspnea scale;Understanding of Exercise Prescription Increase Physical Activity;Increase Strength and Stamina Increase Physical Activity;Understanding of Exercise Prescription;Increase Strength and Stamina Increase Physical Activity;Increase Strength and Stamina;Understanding of Exercise Prescription   Comments Patient did not complete their first day of exercise yet. Patient had an elevated resting HR at his orientation and per Dr. Karna Christmas, had to get a holter ordered to rule out any abnormalities. Patient needs to follow up with the cardiologist and get clearance from them and Dr. Mervyn Skeeters before starting rehab back up. Reviewed RPE  and dyspnea scale, THR and program prescription with pt today.  Pt voiced understanding and was given a copy of goals to take  home. After Bill's orientation he was out for an extended period of time due to health issues and hospitalization. He has now resently started the program and is attending consistently. He is  tolerating exercise well and getting established in his exercise routine. Annette Stable has started his exercise sessions. He has increased his Nustep T4 level to 3 and his handweight to 4 LBs. His RPE is 12 (one at 15 on the Rec Bike) . Will contiue to encourage him to increase workloads as tolerated and  tomonitor his exericse progression. Annette Stable is doing well in rehab. He increased his track laps to 25. He also continues to do well with the 4 lb weights. We will continue to monitor his progress in the program.   Expected Outcomes Short: Get clearance to rehab Long: Build up overall strength and stamina Short: Use RPE daily to regulate intensity. Long: Follow program prescription in THR. Short: attend pulmonary rehab consistently and continue to get estalished in exercise routine. Long: become independent with exercise routine. [id number] workloads as tolerated with goal of increased stamina and strength.   LTG: Continued exercise progression as tolerated during program and after discharge. Short: Continue to follow current exercise prescription, and progressively increase workloads. Long: Continue exercise to improve strength and stamina.    Row Name 04/01/23 1450 04/03/23 0845 04/17/23 1129 05/01/23 1654 05/13/23 0829     Exercise Goal Re-Evaluation   Exercise Goals Review Increase Physical Activity;Increase Strength and Stamina;Understanding of Exercise Prescription Increase Physical Activity;Increase Strength and Stamina;Understanding of Exercise Prescription Increase Physical Activity;Increase Strength and Stamina;Understanding of Exercise Prescription Increase Physical Activity;Increase Strength and Stamina;Understanding of Exercise Prescription Increase Physical Activity;Increase Strength and Stamina;Understanding of  Exercise Prescription   Comments Annette Stable is doing well in rehab. He has only attended one session since the last review. He continues to work at level 4 on [employer] and walk the track. We will continue to monitor his progress in the program upon return. Reviewed home exercise with pt today.  Pt plans to walk every morning around the mall or around [facility] for exercise. Reviewed THR, pulse, RPE, sign and symptoms, pulse oximetery and when to call 911 or MD.  Also discussed weather considerations and indoor options.  Pt voiced understanding. Annette Stable continues to do well in rehab. He recently increased his track laps to 28 from 22. He also added the T5 nustep at level 2 to his prescription. We will continue to monitor his progress in the program. Annette Stable continues to do well in rehab. He recently increased his level on the T4 nustep from 2 to 3. He has maintained his intensity on the recumbent bike and track. We will continue to monitor his progress in the program. Annette Stable states that he has been doing well with exercise. He reports noticing improvement with his strength and stamina, as well as his shortness of breath during exercise. On his days away from rehab he has been walking for 45 minutes at the [facility] in [place]. He states that he stops walking when his O2 saturations drop below 90%, but they usually recover quickly within 30 seconds. We will continue to monitor his progress in the program.   Expected Outcomes Short: Return to the program once cleared and able. Long: Continue exercise to improve strength and stamina. Short: Continue exercise outside of the program. Long: Continue exercise to improve strength and stamina. Short: Continue to follow current exercise prescription, and progressively increase workloads. Long: Continue exercise to improve strength and stamina. Short: Continue to follow current exercise prescription, and progressively increase workloads. Long: Continue exercise to improve  strength and stamina. Short: Continue to walk at the mall on days away from rehab. Long: Continue  to exercise independently.    Row Name 05/15/23 1721 05/29/23 1449           Exercise Goal Re-Evaluation   Exercise Goals Review Increase Physical Activity;Increase Strength and Stamina;Understanding of Exercise Prescription Increase Physical Activity;Increase Strength and Stamina;Understanding of Exercise Prescription      Comments Annette Stable continues to do well in rehab. He has been able to maintain his recumbent bike intensity at level 2. He has also increased his track laps from 21 to 28. We will continue to monitor his progress in the program. Annette Stable continues to work hard in the program. He has been able to increase his wattage on the bike from 15 watts to 18 on a level of 2. He has also been able to roughly maintain his number of track laps in 15 minutes. We will continue to monitor his progress in the program.      Expected Outcomes Short: Continue to increase workload on the Recumbent bike. Long: continue to exercise to improve strength and stamina. Short: Continue to increase workload on the Recumbent bike, and T4 nustep. Long: continue to exercise to improve strength and stamina.               Discharge Exercise Prescription (Final Exercise Prescription Changes):  Exercise Prescription Changes - 05/29/23 1400       Response to Exercise   Blood Pressure (Admit) 128/60    Blood Pressure (Exit) 114/72    Heart Rate (Admit) 90 bpm    Heart Rate (Exercise) 111 bpm    Heart Rate (Exit) 96 bpm    Oxygen Saturation (Admit) 92 %    Oxygen Saturation (Exercise) 87 %    Oxygen Saturation (Exit) 92 %    Rating of Perceived Exertion (Exercise) 12    Perceived Dyspnea (Exercise) 0    Symptoms SOB    Duration Progress to 30 minutes of  aerobic without signs/symptoms of physical distress    Intensity THRR unchanged      Progression   Progression Continue to progress workloads to maintain  intensity without signs/symptoms of physical distress.    Average METs 2.47      Resistance Training   Training Prescription Yes    Weight 4 lb    Reps 10-15      Interval Training   Interval Training No      Oxygen   Oxygen Continuous    Liters 2      Recumbant Bike   Level 2    Watts 18    Minutes 15    METs 2.7      NuStep   Level 3    Minutes 15    METs 3      Track   Laps 25    Minutes 15    METs 2.36      Home Exercise Plan   Plans to continue exercise at Home (comment)   Walking at home around [facility] or at the mall   Frequency Add 3 additional days to program exercise sessions.    Initial Home Exercises Provided 04/03/23      Oxygen   Maintain Oxygen Saturation 88% or higher             Nutrition:  Target Goals: Understanding of nutrition guidelines, daily intake of sodium 1500mg , cholesterol 200mg , calories 30% from fat and 7% or less from saturated fats, daily to have 5 or more servings of fruits and vegetables.  Education: All About Nutrition: -Group instruction provided  by verbal, written material, interactive activities, discussions, models, and posters to present general guidelines for heart healthy nutrition including fat, fiber, MyPlate, the role of sodium in heart healthy nutrition, utilization of the nutrition label, and utilization of this knowledge for meal planning. Follow up email sent as well. Written material given at graduation. Flowsheet Row Pulmonary Rehab from 12/05/2022 in [facility] Cardiac and Pulmonary Rehab  Education need identified 12/05/22       Biometrics:    Nutrition Therapy Plan and Nutrition Goals:   Nutrition Assessments:  MEDIFICTS Score Key: >=70 Need to make dietary changes  40-70 Heart Healthy Diet <= 40 Therapeutic Level Cholesterol Diet  Flowsheet Row Pulmonary Rehab from 12/05/2022 in [facility] Cardiac and Pulmonary Rehab  Picture Your Plate Total Score on Admission 81      Picture Your Plate  Scores: <81 Unhealthy dietary pattern with much room for improvement. 41-50 Dietary pattern unlikely to meet recommendations for good health and room for improvement. 51-60 More healthful dietary pattern, with some room for improvement.  >60 Healthy dietary pattern, although there may be some specific behaviors that could be improved.   Nutrition Goals Re-Evaluation:  Nutrition Goals Re-Evaluation     Row Name 02/25/23 0747 05/13/23 0835 05/29/23 0745         Goals   Current Weight -- -- 179 lb (81.2 kg)     Comment Patient decided to cancel RD apt and defer meeting for now. He plans to attend the nutrition group education class in the future. Annette Stable has not met with the RD and states he feels he does not need to at this time. He states that his wife is very good at helping him with his nutrition as he states "she could write a book." He has cut out junk food and continues to work towards a healthy diet. Patient was informed on why it is important to maintain a balanced diet when dealing with Respiratory issues. Explained that it takes a lot of energy to breath and when they are short of breath often they will need to have a good diet to help keep up with the calories they are expending for breathing.     Expected Outcome -- -- Short: Choose and plan snacks accordingly to patients caloric intake to improve breathing. Long: Maintain a diet independently that meets their caloric intake to aid in daily shortness of breath.              Nutrition Goals Discharge (Final Nutrition Goals Re-Evaluation):  Nutrition Goals Re-Evaluation - 05/29/23 0745       Goals   Current Weight 179 lb (81.2 kg)    Comment Patient was informed on why it is important to maintain a balanced diet when dealing with Respiratory issues. Explained that it takes a lot of energy to breath and when they are short of breath often they will need to have a good diet to help keep up with the calories they are expending for  breathing.    Expected Outcome Short: Choose and plan snacks accordingly to patients caloric intake to improve breathing. Long: Maintain a diet independently that meets their caloric intake to aid in daily shortness of breath.             Psychosocial: Target Goals: Acknowledge presence or absence of significant depression and/or stress, maximize coping skills, provide positive support system. Participant is able to verbalize types and ability to use techniques and skills needed for reducing stress and depression.  Education: Stress, Anxiety, and Depression - Group verbal and visual presentation to define topics covered.  Reviews how body is impacted by stress, anxiety, and depression.  Also discusses healthy ways to reduce stress and to treat/manage anxiety and depression.  Written material given at graduation.   Education: Sleep Hygiene -Provides group verbal and written instruction about how sleep can affect your health.  Define sleep hygiene, discuss sleep cycles and impact of sleep habits. Review good sleep hygiene tips.    Initial Review & Psychosocial Screening:   Quality of Life Scores:  Scores of 19 and below usually indicate a poorer quality of life in these areas.  A difference of  2-3 points is a clinically meaningful difference.  A difference of 2-3 points in the total score of the Quality of Life Index has been associated with significant improvement in overall quality of life, self-image, physical symptoms, and general health in studies assessing change in quality of life.  PHQ-9: Review Flowsheet  More data exists      12/05/2022 07/01/2022 05/01/2022 07/01/2019 06/24/2018  Depression screen PHQ 2/9  Decreased Interest 0 0 0 0 0  Down, Depressed, Hopeless 0 0 0 0 1  PHQ - 2 Score 0 0 0 0 1  Altered sleeping 0 0 0 - -  Tired, decreased energy 1 0 0 - -  Change in appetite 0 0 0 - -  Feeling bad or failure about yourself  0 0 0 - -  Trouble concentrating 0 0 0 - -   Moving slowly or fidgety/restless 0 0 0 - -  Suicidal thoughts 0 0 0 - -  PHQ-9 Score 1 0 0 - -  Difficult doing work/chores Not difficult at all Not difficult at all Not difficult at all - -    Details           Interpretation of Total Score  Total Score Depression Severity:  1-4 = Minimal depression, 5-9 = Mild depression, 10-14 = Moderate depression, 15-19 = Moderately severe depression, 20-27 = Severe depression   Psychosocial Evaluation and Intervention:   Psychosocial Re-Evaluation:  Psychosocial Re-Evaluation     Row Name 02/25/23 [phone] 05/13/23 4098 05/29/23 0746         Psychosocial Re-Evaluation   Current issues with None Identified None Identified None Identified     Comments Patient reports no new concerns or changes in sleep, stress, or mental health. He has recently started the program after a time gap between orientation and first day of class due to health concerns. He reports he is enjoying coming to class. Bill reports no current stressors at this time. He also has been getting good sleep and using his CPAP. He states that his wife and family are a good support system for him. He also enjoys playing cards with friends and exercising for stress relief. Patient reports no issues with their current mental states, sleep, stress, depression or anxiety. Will follow up with patient in a few weeks for any changes.     Expected Outcomes Short: continue to attend pulmonary rehab consistenly for mental health benefit. Long: maintain good mental health habits. Short: Continue to exercise for mental boost. Long: Maintain positive outlook. Short: Continue to exercise regularly to support mental health and notify staff of any changes. Long: maintain mental health and well being through teaching of rehab or prescribed medications independently.     Interventions Encouraged to attend Pulmonary Rehabilitation for the exercise Encouraged to attend Pulmonary Rehabilitation for the  exercise Encouraged to attend Pulmonary Rehabilitation for the exercise     Continue Psychosocial Services  Follow up required by staff Follow up required by staff Follow up required by staff              Psychosocial Discharge (Final Psychosocial Re-Evaluation):  Psychosocial Re-Evaluation - 05/29/23 0746       Psychosocial Re-Evaluation   Current issues with None Identified    Comments Patient reports no issues with their current mental states, sleep, stress, depression or anxiety. Will follow up with patient in a few weeks for any changes.    Expected Outcomes Short: Continue to exercise regularly to support mental health and notify staff of any changes. Long: maintain mental health and well being through teaching of rehab or prescribed medications independently.    Interventions Encouraged to attend Pulmonary Rehabilitation for the exercise    Continue Psychosocial Services  Follow up required by staff             Education: Education Goals: Education classes will be provided on a weekly basis, covering required topics. Participant will state understanding/return demonstration of topics presented.  Learning Barriers/Preferences:   General Pulmonary Education Topics:  Infection Prevention: - Provides verbal and written material to individual with discussion of infection control including proper hand washing and proper equipment cleaning during exercise session. Flowsheet Row Pulmonary Rehab from 12/05/2022 in [facility] Cardiac and Pulmonary Rehab  Education need identified 12/05/22  Date 12/05/22  Educator KW  Instruction Review Code 1- Verbalizes Understanding       Falls Prevention: - Provides verbal and written material to individual with discussion of falls prevention and safety. Flowsheet Row Pulmonary Rehab from 12/05/2022 in [facility] Cardiac and Pulmonary Rehab  Education need identified 12/05/22  Date 12/05/22  Educator KW  Instruction Review Code 1- Verbalizes  Understanding       Chronic Lung Disease Review: - Group verbal instruction with posters, models, PowerPoint presentations and videos,  to review new updates, new respiratory medications, new advancements in procedures and treatments. Providing information on websites and "800" numbers for continued self-education. Includes information about supplement oxygen, available portable oxygen systems, continuous and intermittent flow rates, oxygen safety, concentrators, and Medicare reimbursement for oxygen. Explanation of Pulmonary Drugs, including class, frequency, complications, importance of spacers, rinsing mouth after steroid MDI's, and proper cleaning methods for nebulizers. Review of basic lung anatomy and physiology related to function, structure, and complications of lung disease. Review of risk factors. Discussion about methods for diagnosing sleep apnea and types of masks and machines for OSA. Includes a review of the use of types of environmental controls: home humidity, furnaces, filters, dust mite/pet prevention, HEPA vacuums. Discussion about weather changes, air quality and the benefits of nasal washing. Instruction on Warning signs, infection symptoms, calling MD promptly, preventive modes, and value of vaccinations. Review of effective airway clearance, coughing and/or vibration techniques. Emphasizing that all should Create an Action Plan. Written material given at graduation. Flowsheet Row Pulmonary Rehab from 12/05/2022 in [facility] Cardiac and Pulmonary Rehab  Education need identified 12/05/22       AED/CPR: - Group verbal and written instruction with the use of models to demonstrate the basic use of the AED with the basic ABC's of resuscitation.    Anatomy and Cardiac Procedures: - Group verbal and visual presentation and models provide information about basic cardiac anatomy and function. Reviews the testing methods done to diagnose heart disease and the outcomes of the test results.  Describes the treatment  choices: Medical Management, Angioplasty, or Coronary Bypass Surgery for treating various heart conditions including Myocardial Infarction, Angina, Valve Disease, and Cardiac Arrhythmias.  Written material given at graduation.   Medication Safety: - Group verbal and visual instruction to review commonly prescribed medications for heart and lung disease. Reviews the medication, class of the drug, and side effects. Includes the steps to properly store meds and maintain the prescription regimen.  Written material given at graduation.   Other: -Provides group and verbal instruction on various topics (see comments)   Knowledge Questionnaire Score:    Core Components/Risk Factors/Patient Goals at Admission:   Education:Diabetes - Individual verbal and written instruction to review signs/symptoms of diabetes, desired ranges of glucose level fasting, after meals and with exercise. Acknowledge that pre and post exercise glucose checks will be done for 3 sessions at entry of program.   Know Your Numbers and Heart Failure: - Group verbal and visual instruction to discuss disease risk factors for cardiac and pulmonary disease and treatment options.  Reviews associated critical values for Overweight/Obesity, Hypertension, Cholesterol, and Diabetes.  Discusses basics of heart failure: signs/symptoms and treatments.  Introduces Heart Failure Zone chart for action plan for heart failure.  Written material given at graduation.   Core Components/Risk Factors/Patient Goals Review:   Goals and Risk Factor Review     Row Name 02/25/23 0757 05/13/23 0837 05/29/23 0744         Core Components/Risk Factors/Patient Goals Review   Personal Goals Review Hypertension;Lipids Hypertension;Improve shortness of breath with ADL's Improve shortness of breath with ADL's     Review Patient reports that he consistently takes all BP and cholesterol meds and has a good routine with his medications  and is currently not having any problems with meds. He checks his BP every day at home 2 times a day. He just got a new BP machine. Annette Stable continues to check his BP and reports his reading have been within normal ranges. He has been taking all his medications as prescribed. He also reports that his shortness of breath has improved with ADLs as he states he previously could not walk to the mailbox without O2. Now he is able to not be on O2 while doing activities at home, although he states he does not leave the house without his O2. Spoke to patient about their shortness of breath and what they can do to improve. Patient has been informed of breathing techniques when starting the program. Patient is informed to tell staff if they have had any med changes and that certain meds they are taking or not taking can be causing shortness of breath.     Expected Outcomes Short: continue to monitor BP at home and take all meds as prescribed. Long: continue to control risk factors with meds and healthy lifestyle. Short: Continue to monitor BP at home and take all meds as prescribed. Long: Continue to monitor lifestyle risk factors. Short: Attend [facility] regularly to improve shortness of breath with ADL's. Long: maintain independence with ADL's              Core Components/Risk Factors/Patient Goals at Discharge (Final Review):   Goals and Risk Factor Review - 05/29/23 0744       Core Components/Risk Factors/Patient Goals Review   Personal Goals Review Improve shortness of breath with ADL's    Review Spoke to patient about their shortness of breath and what they can do to improve. Patient has been informed of breathing techniques when starting the program.  Patient is informed to tell staff if they have had any med changes and that certain meds they are taking or not taking can be causing shortness of breath.    Expected Outcomes Short: Attend [facility] regularly to improve shortness of breath with ADL's. Long:  maintain independence with ADL's             ITP Comments:  ITP Comments     Row Name 12/19/22 1117 12/25/22 0759 01/16/23 1417 01/22/23 0725 02/03/23 1618   ITP Comments Called and spoke with patient's wife- patient is due to see a cardiologist on 6/3 to follow up on holter monitor results. Patient had a holter orderd per Dr. Karna Christmas after he was notified that patient had elevated resting HR at pulmonary rehab orientation. Patient needs clearance from cardiologist and Dr. Karna Christmas before starting pulmonary rehab. Wife and patient aware they need written clearance from both MDs. Wife and patient will call us after cardiology appt to keep [place] updated. Placing patient on medical hold at this time before starting rehab. 30 Day review completed. Medical Director ITP review done, changes made as directed, and signed approval by Medical Director.   remains on medical hold Called pt to follow up from MD appt for clearance to start rehab.  Note show no findings and needing rehab.  Left message for patient. Pt has not yet started rehab. 30 Day review completed. Medical Director ITP review done, changes made as directed, and signed approval by Medical Director.   waiting to start after medical clearance Called and spoke with pt. He has been cleared by cardiology to begin pulmonary rehab. Pt will start rehab 02/18/23 at 7:15 am.    Row Name 02/18/23 1610 02/18/23 1356 03/19/23 1118 04/16/23 0929 05/14/23 0953   ITP Comments First full day of exercise!  Patient was oriented to gym and equipment including functions, settings, policies, and procedures.  Patient's individual exercise prescription and treatment plan were reviewed.  All starting workloads were established based on the results of the 6 minute walk test done at initial orientation visit.  The plan for exercise progression was also introduced and progression will be customized based on patient's performance and goals. 30 Day review completed. Medical  Director ITP review done, changes made as directed, and signed approval by Medical Director.   new to program 30 Day review completed. Medical Director ITP review done, changes made as directed, and signed approval by Medical Director. 30 Day review completed. Medical Director ITP review done, changes made as directed, and signed approval by Medical Director. 30 Day review completed. Medical Director ITP review done, changes made as directed, and signed approval by Medical Director.    Row Name 06/11/23 0837           ITP Comments 30 Day review completed. Medical Director ITP review done, changes made as directed, and signed approval by Medical Director.                Comments:

## 2023-06-12 ENCOUNTER — Encounter: Payer: Medicare HMO | Admitting: *Deleted

## 2023-06-12 DIAGNOSIS — J449 Chronic obstructive pulmonary disease, unspecified: Secondary | ICD-10-CM

## 2023-06-12 DIAGNOSIS — Z5189 Encounter for other specified aftercare: Secondary | ICD-10-CM | POA: Diagnosis not present

## 2023-06-12 NOTE — Progress Notes (Signed)
Daily Session Note  Patient Details  Name: Gregory Haynes MRN: [id number] Date of Birth: March 15, 1941 Referring Provider:    Encounter Date: 06/12/2023  Check In:  Session Check In - 06/12/23 0746       Check-In   Supervising physician immediately available to respond to emergencies See telemetry face sheet for immediately available ER MD    Location [facility]-Cardiac & Pulmonary Rehab    Staff Present Cora Collum, RN, BSN, CCRP;Joseph Hood, RCP,RRT,BSRT;Maxon Locust BS, , Exercise Physiologist;Corby Villasenor Katrinka Blazing, RN, [place]    Virtual Visit No    Medication changes reported     No    Fall or balance concerns reported    No    Warm-up and Cool-down Performed on first and last piece of equipment    Resistance Training Performed Yes    VAD Patient? No    PAD/SET Patient? No      Pain Assessment   Currently in Pain? No/denies                Social History   Tobacco Use  Smoking Status Former   Current packs/day: 0.00   Average packs/day: 1.5 packs/day for 55.0 years (82.5 ttl pk-yrs)   Types: Cigarettes   Start date: 08/12/1956   Quit date: 08/13/2011   Years since quitting: 11.8  Smokeless Tobacco Never    Goals Met:  Independence with exercise equipment Exercise tolerated well No report of concerns or symptoms today Strength training completed today  Goals Unmet:  Not Applicable  Comments: Pt able to follow exercise prescription today without complaint.  Will continue to monitor for progression.    Dr. Bethann Punches is Medical Director for [facility] Cardiac Rehabilitation.  Dr. Vida Rigger is Medical Director for [facility] Pulmonary Rehabilitation.

## 2023-06-14 DIAGNOSIS — J449 Chronic obstructive pulmonary disease, unspecified: Secondary | ICD-10-CM | POA: Diagnosis not present

## 2023-06-14 DIAGNOSIS — J9621 Acute and chronic respiratory failure with hypoxia: Secondary | ICD-10-CM | POA: Diagnosis not present

## 2023-06-17 ENCOUNTER — Encounter: Payer: Medicare HMO | Attending: Pulmonary Disease | Admitting: *Deleted

## 2023-06-17 VITALS — Ht 71.5 in | Wt 178.9 lb

## 2023-06-17 DIAGNOSIS — J449 Chronic obstructive pulmonary disease, unspecified: Secondary | ICD-10-CM | POA: Insufficient documentation

## 2023-06-17 NOTE — Patient Instructions (Addendum)
Discharge Patient Instructions  Patient Details  Name: Gregory Haynes MRN: [id number] Date of Birth: 09-Apr-1941 Referring Provider:  No ref. provider found   Number of Visits: 36  Reason for Discharge:  Patient reached a stable level of exercise. Patient independent in their exercise. Patient has met program and personal goals.  Diagnosis:  Chronic obstructive pulmonary disease, unspecified COPD type [facility])  Discharge Exercise Prescription (Final Exercise Prescription Changes):  Exercise Prescription Changes - 06/12/23 1300       Response to Exercise   Blood Pressure (Admit) 122/66    Blood Pressure (Exit) 110/50    Heart Rate (Admit) 93 bpm    Heart Rate (Exercise) 108 bpm    Heart Rate (Exit) 101 bpm    Oxygen Saturation (Admit) 95 %    Oxygen Saturation (Exercise) 90 %    Oxygen Saturation (Exit) 98 %    Rating of Perceived Exertion (Exercise) 13    Perceived Dyspnea (Exercise) 2    Symptoms SOB    Duration Progress to 30 minutes of  aerobic without signs/symptoms of physical distress    Intensity THRR unchanged      Progression   Progression Continue to progress workloads to maintain intensity without signs/symptoms of physical distress.    Average METs 2.3      Resistance Training   Training Prescription Yes    Weight 4 lb    Reps 10-15      Interval Training   Interval Training No      Oxygen   Oxygen Continuous    Liters 2      Recumbant Bike   Level 2    Watts 16    Minutes 15    METs 2.62      NuStep   Level 2    Minutes 15    METs 1.9      Track   Laps 25    Minutes 15    METs 2.36      Home Exercise Plan   Plans to continue exercise at Home (comment)   Walking at home around [facility] or at the mall   Frequency Add 3 additional days to program exercise sessions.    Initial Home Exercises Provided 04/03/23      Oxygen   Maintain Oxygen Saturation 88% or higher             Functional Capacity:  6 Minute Walk     Row  Name 06/17/23 0754         6 Minute Walk   Phase Discharge     Distance 1120 feet     Distance % Change 25.8 %     Distance Feet Change 230 ft     Walk Time 6 minutes     # of Rest Breaks 0     MPH 2.12     METS 2.39     RPE 11     Perceived Dyspnea  1     VO2 Peak 8.35     Symptoms No     Resting HR 82 bpm     Resting BP 134/76     Resting Oxygen Saturation  98 %     Exercise Oxygen Saturation  during 6 min walk 90 %     Max Ex. HR 104 bpm     Max Ex. BP 144/78     2 Minute Post BP 126/70       Interval HR   1 Minute HR 95  2 Minute HR 94     3 Minute HR 99     4 Minute HR 99     5 Minute HR 103     6 Minute HR 104     2 Minute Post HR 93     Interval Heart Rate? Yes       Interval Oxygen   Interval Oxygen? Yes     Baseline Oxygen Saturation % 98 %     1 Minute Oxygen Saturation % 93 %     1 Minute Liters of Oxygen 2 L  pulsed     2 Minute Oxygen Saturation % 90 %     2 Minute Liters of Oxygen 2 L     3 Minute Oxygen Saturation % 90 %     3 Minute Liters of Oxygen 2 L     4 Minute Oxygen Saturation % 91 %     4 Minute Liters of Oxygen 2 L     5 Minute Oxygen Saturation % 92 %     5 Minute Liters of Oxygen 2 L     6 Minute Oxygen Saturation % 90 %     6 Minute Liters of Oxygen 2 L     2 Minute Post Oxygen Saturation % 97 %     2 Minute Post Liters of Oxygen 2 L             Nutrition & Weight - Outcomes:   Post Biometrics - 06/17/23 0803        Post  Biometrics   Height 5' 11.5" (1.816 m)    Weight 178 lb 14.4 oz (81.1 kg)    Waist Circumference 42 inches    Hip Circumference 40 inches    Waist to Hip Ratio 1.05 %    BMI (Calculated) 24.61    Single Leg Stand 7.4 seconds

## 2023-06-17 NOTE — Progress Notes (Signed)
Daily Session Note  Patient Details  Name: Gregory Haynes MRN: [id number] Date of Birth: [birth year] Referring Provider:    Encounter Date: 06/17/2023  Check In:  Session Check In - 06/17/23 0755       Check-In   Supervising physician immediately available to respond to emergencies See telemetry face sheet for immediately available ER MD    Location [facility]    Staff Present Cora Collum, RN, BSN, CCRP;Margaret Best, MS, Exercise Physiologist;Noah Tickle, BS, Exercise Physiologist    Virtual Visit No    Medication changes reported     No    Fall or balance concerns reported    No    Warm-up and Cool-down Performed on first and last piece of equipment    Resistance Training Performed Yes    VAD Patient? No    PAD/SET Patient? No      Pain Assessment   Currently in Pain? No/denies              6 Minute Walk     Row Name 06/17/23 0754         6 Minute Walk   Phase Discharge     Distance 1120 feet     Distance % Change 25.8 %     Distance Feet Change 230 ft     Walk Time 6 minutes     # of Rest Breaks 0     MPH 2.12     METS 2.39     RPE 11     Perceived Dyspnea  1     VO2 Peak 8.35     Symptoms No     Resting HR 82 bpm     Resting BP 134/76     Resting Oxygen Saturation  98 %     Exercise Oxygen Saturation  during 6 min walk 90 %     Max Ex. HR 104 bpm     Max Ex. BP 144/78     2 Minute Post BP 126/70       Interval HR   1 Minute HR 95     2 Minute HR 94     3 Minute HR 99     4 Minute HR 99     5 Minute HR 103     6 Minute HR 104     2 Minute Post HR 93     Interval Heart Rate? Yes       Interval Oxygen   Interval Oxygen? Yes     Baseline Oxygen Saturation % 98 %     1 Minute Oxygen Saturation % 93 %     1 Minute Liters of Oxygen 2 L  pulsed     2 Minute Oxygen Saturation % 90 %     2 Minute Liters of Oxygen 2 L     3 Minute Oxygen Saturation % 90 %     3 Minute Liters of Oxygen 2 L     4 Minute Oxygen Saturation % 91 %      4 Minute Liters of Oxygen 2 L     5 Minute Oxygen Saturation % 92 %     5 Minute Liters of Oxygen 2 L     6 Minute Oxygen Saturation % 90 %     6 Minute Liters of Oxygen 2 L     2 Minute Post Oxygen Saturation % 97 %     2 Minute Post Liters of Oxygen 2 L  Social History   Tobacco Use  Smoking Status Former   Current packs/day: 0.00   Average packs/day: 1.5 packs/day for 55.0 years (82.5 ttl pk-yrs)   Types: Cigarettes   Start date: 08/12/1956   Quit date: 08/13/2011   Years since quitting: 11.8  Smokeless Tobacco Never    Goals Met:  Proper associated with RPD/PD & O2 Sat Independence with exercise equipment Exercise tolerated well No report of concerns or symptoms today  Goals Unmet:  Not Applicable  Comments: Pt able to follow exercise prescription today without complaint.  Will continue to monitor for progression.    Dr. Bethann Punches is Medical Director for [facility].  Dr. Vida Rigger is Medical Director for [facility] Pulmonary Rehabilitation.

## 2023-06-19 ENCOUNTER — Encounter: Payer: Medicare HMO | Admitting: *Deleted

## 2023-06-19 DIAGNOSIS — J449 Chronic obstructive pulmonary disease, unspecified: Secondary | ICD-10-CM | POA: Diagnosis not present

## 2023-06-19 NOTE — Progress Notes (Signed)
Daily Session Note  Patient Details  Name: Gregory Haynes MRN: [id number] Date of Birth: 1941-05-31 Referring Provider:    Encounter Date: 06/19/2023  Check In:  Session Check In - 06/19/23 0741       Check-In   Supervising physician immediately available to respond to emergencies See telemetry face sheet for immediately available ER MD    Location [facility]    Staff Present Ronette Deter, BS, Exercise Physiologist;Susanne Bice, RN, BSN, CCRP;Joseph [place], Guinevere Ferrari, RN, [place]    Virtual Visit No    Medication changes reported     No    Fall or balance concerns reported    No    Warm-up and Cool-down Performed on first and last piece of equipment    Resistance Training Performed Yes    VAD Patient? No    PAD/SET Patient? No      Pain Assessment   Currently in Pain? No/denies                Social History   Tobacco Use  Smoking Status Former   Current packs/day: 0.00   Average packs/day: 1.5 packs/day for 55.0 years (82.5 ttl pk-yrs)   Types: Cigarettes   Start date: 08/12/1956   Quit date: 08/13/2011   Years since quitting: 11.8  Smokeless Tobacco Never    Goals Met:  Independence with exercise equipment Exercise tolerated well No report of concerns or symptoms today Strength training completed today  Goals Unmet:  Not Applicable  Comments: Pt able to follow exercise prescription today without complaint.  Will continue to monitor for progression.    Dr. Bethann Punches is Medical Director for [facility] Card[facility] Rehabilitation.  Dr. Vida Rigger is Medical Director for [facility] Pulmonary Rehabilitation.

## 2023-06-22 DIAGNOSIS — J449 Chronic obstructive pulmonary disease, unspecified: Secondary | ICD-10-CM | POA: Diagnosis not present

## 2023-06-24 DIAGNOSIS — J449 Chronic obstructive pulmonary disease, unspecified: Secondary | ICD-10-CM | POA: Diagnosis not present

## 2023-06-24 NOTE — Progress Notes (Signed)
Daily Session Note  Patient Details  Name: Gregory Haynes MRN: [id number] Date of Birth: 31-Dec-1940 Referring Provider:    Encounter Date: 06/24/2023  Check In:  Session Check In - 06/24/23 0752       Check-In   Supervising physician immediately available to respond to emergencies See telemetry face sheet for immediately available ER MD    Location [facility]    Staff Present Maxon Conetta BS, , Exercise Physiologist;Missael Ferrari, RN, BSN;Noah Tickle, BS, Exercise Physiologist;Margaret Best, MS, Exercise Physiologist    Virtual Visit No    Medication changes reported     No    Fall or balance concerns reported    No    Warm-up and Cool-down Performed on first and last piece of equipment    Resistance Training Performed Yes    VAD Patient? No    PAD/SET Patient? No      Pain Assessment   Currently in Pain? No/denies                Social History   Tobacco Use  Smoking Status Former   Current packs/day: 0.00   Average packs/day: 1.5 packs/day for 55.0 years (82.5 ttl pk-yrs)   Types: Cigarettes   Start date: 08/12/1956   Quit date: 08/13/2011   Years since quitting: 11.8  Smokeless Tobacco Never    Goals Met:  Proper associated with RPD/PD & O2 Sat Independence with exercise equipment Using PLB without cueing & demonstrates good technique Exercise tolerated well No report of concerns or symptoms today Strength training completed today  Goals Unmet:  Not Applicable  Comments: Pt able to follow exercise prescription today without complaint.  Will continue to monitor for progression.   Dr. Bethann Punches is Medical Director for [facility] Card[facility].  Dr. Vida Rigger is Medical Director for [facility] Pulmonary Rehabilitation.

## 2023-06-26 ENCOUNTER — Encounter: Payer: Medicare HMO | Admitting: *Deleted

## 2023-06-26 DIAGNOSIS — J449 Chronic obstructive pulmonary disease, unspecified: Secondary | ICD-10-CM

## 2023-06-26 NOTE — Progress Notes (Signed)
Daily Session Note  Patient Details  Name: Gregory Haynes MRN: [id number] Date of Birth: [birth year] Referring Provider:    Encounter Date: 06/26/2023  Check In:  Session Check In - 06/26/23 0757       Check-In   Supervising physician immediately available to respond to emergencies See telemetry face sheet for immediately available ER MD    Location [facility]    Staff Present Ronette Deter, BS, Exercise Physiologist;Susanne Bice, RN, BSN, CCRP;Joseph [place], Guinevere Ferrari, RN, [place]    Virtual Visit No    Medication changes reported     No    Fall or balance concerns reported    No    Warm-up and Cool-down Performed on first and last piece of equipment    Resistance Training Performed Yes    VAD Patient? No    PAD/SET Patient? No      Pain Assessment   Currently in Pain? No/denies                Social History   Tobacco Use  Smoking Status Former   Current packs/day: 0.00   Average packs/day: 1.5 packs/day for 55.0 years (82.5 ttl pk-yrs)   Types: Cigarettes   Start date: 08/12/1956   Quit date: 08/13/2011   Years since quitting: 11.8  Smokeless Tobacco Never    Goals Met:  Independence with exercise equipment Exercise tolerated well No report of concerns or symptoms today Strength training completed today  Goals Unmet:  Not Applicable  Comments: Pt able to follow exercise prescription today without complaint.  Will continue to monitor for progression.    Dr. Bethann Punches is Medical Director for [facility] Card[facility].  Dr. Vida Rigger is Medical Director for [facility] Pulmonary Rehabilitation.

## 2023-07-01 ENCOUNTER — Encounter: Payer: Medicare HMO | Admitting: *Deleted

## 2023-07-01 DIAGNOSIS — J449 Chronic obstructive pulmonary disease, unspecified: Secondary | ICD-10-CM

## 2023-07-01 NOTE — Progress Notes (Signed)
Daily Session Note  Patient Details  Name: Gregory Haynes MRN: [id number] Date of Birth: 06/14/1941 Referring Provider:    Encounter Date: 07/01/2023  Check In:  Session Check In - 07/01/23 0823       Check-In   Supervising physician immediately available to respond to emergencies See telemetry face sheet for immediately available ER MD    Location [facility]    Staff Present Cora Collum, RN, BSN, CCRP;Margaret Best, MS, Exercise Physiologist;Noah Tickle, BS, Exercise Physiologist;Maxon Conetta BS, , Exercise Physiologist    Virtual Visit No    Medication changes reported     No    Fall or balance concerns reported    No    Warm-up and Cool-down Performed on first and last piece of equipment    Resistance Training Performed Yes    VAD Patient? No    PAD/SET Patient? No      Pain Assessment   Currently in Pain? No/denies                Social History   Tobacco Use  Smoking Status Former   Current packs/day: 0.00   Average packs/day: 1.5 packs/day for 55.0 years (82.5 ttl pk-yrs)   Types: Cigarettes   Start date: 08/12/1956   Quit date: 08/13/2011   Years since quitting: 11.8  Smokeless Tobacco Never    Goals Met:  Proper associated with RPD/PD & O2 Sat Independence with exercise equipment Exercise tolerated well No report of concerns or symptoms today  Goals Unmet:  Not Applicable  Comments: Pt able to follow exercise prescription today without complaint.  Will continue to monitor for progression.    Dr. Bethann Punches is Medical Director for [facility] Card[facility] Rehabilitation.  Dr. Vida Rigger is Medical Director for [facility] Pulmonary Rehabilitation.

## 2023-07-02 ENCOUNTER — Encounter: Payer: Self-pay | Admitting: *Deleted

## 2023-07-02 DIAGNOSIS — J449 Chronic obstructive pulmonary disease, unspecified: Secondary | ICD-10-CM

## 2023-07-02 NOTE — Progress Notes (Signed)
Pulmonary Individual Treatment Plan  Patient Details  Name: Gregory Haynes MRN: [id number] Date of Birth: [birth year] Referring Provider:    Initial Encounter Date:  Flowsheet Row Pulmonary Rehab from 12/05/2022 in [facility] Cardiac and Pulmonary Rehab  Date 12/05/22       Visit Diagnosis: Chronic obstructive pulmonary disease, unspecified COPD type (HCC)  Patient's Home Medications on Admission:  Current Outpatient Medications:    acidophilus (RISAQUAD) CAPS capsule, Take 1 capsule by mouth daily., Disp: , Rfl:    albuterol (VENTOLIN HFA) 108 (90 Base) MCG/ACT inhaler, Inhale 1-2 puffs into the lungs every 4 (four) hours as needed for wheezing or shortness of breath., Disp: 18 g, Rfl: 3   atorvastatin (LIPITOR) 40 MG tablet, Take 1 tablet (40 mg total) by mouth every evening., Disp: 90 tablet, Rfl: 3   carvedilol (COREG) 3.125 MG tablet, Take 1 tablet (3.125 mg total) by mouth 2 (two) times daily with a meal., Disp: 60 tablet, Rfl: 1   clobetasol cream (TEMOVATE) 0.05 %, Apply 1 Application topically 2 (two) times daily., Disp: , Rfl:    feeding supplement (ENSURE ENLIVE / ENSURE PLUS) LIQD, Take 237 mLs by mouth 3 (three) times daily. (Patient not taking: Reported on 12/03/2022), Disp: 237 mL, Rfl: 12   Fluticasone-Umeclidin-Vilant (TRELEGY ELLIPTA) 100-62.5-25 MCG/INH AEPB, Inhale 1 puff into the lungs daily., Disp: , Rfl:    folic acid (FOLVITE) 1 MG tablet, Take 1 mg by mouth daily. Unsure dose (Patient not taking: Reported on 12/03/2022), Disp: , Rfl:    ipratropium-albuterol (DUONEB) 0.5-2.5 (3) MG/3ML SOLN, Inhale into the lungs., Disp: , Rfl:    lisinopril (ZESTRIL) 2.5 MG tablet, Take 1 tablet (2.5 mg total) by mouth daily., Disp: 30 tablet, Rfl: 0   OXYGEN, Inhale 2 L/hr into the lungs. At night and as needed during day., Disp: , Rfl:   Past Medical History: Past Medical History:  Diagnosis Date   Anemia    Cancer (HCC)    BCC   Cataract    COPD (chronic obstructive pulmonary  disease) (HCC)    Helicobacter pylori ab+    Hyperlipidemia    Hypertension    Psoriasis    Wears hearing aid in both ears     Tobacco Use: Social History   Tobacco Use  Smoking Status Former   Current packs/day: 0.00   Average packs/day: 1.5 packs/day for 55.0 years (82.5 ttl pk-yrs)   Types: Cigarettes   Start date: 08/12/1956   Quit date: 08/13/2011   Years since quitting: 11.8  Smokeless Tobacco Never    Labs: Review Flowsheet  More data exists      Latest Ref Rng & Units 05/17/2020 05/01/2022 10/31/2022 11/01/2022 11/02/2022  Labs for ITP Cardiac and Pulmonary Rehab  Cholestrol 100 - 199 mg/dL 562  130  - - -  LDL (calc) 0 - 99 mg/dL 79  70  - - -  HDL-C >86 mg/dL 48  49  - - -  Trlycerides 0 - 149 mg/dL 76  84  - - -  Hemoglobin A1c 4.8 - 5.6 % - - 6.3  - -  PH, Arterial 7.35 - 7.45 - - 7.38  7.36  7.43  7.47   PCO2 arterial 32 - 48 mmHg - - 71  80  69  62   Bicarbonate 20.0 - 28.0 mmol/L - - 48.4  42.0  45.2  45.6  45.8  45.1   O2 Saturation % - - 67  98.3  95.3  94.7  98.9  99.2     Details       Multiple values from one day are sorted in reverse-chronological order          Pulmonary Assessment Scores:  Pulmonary Assessment Scores     Row Name 06/19/23 0802         ADL UCSD   SOB Score total 45     Rest 1     Walk 1     Stairs 3     Bath 3     Dress 2     Shop 2       CAT Score   CAT Score 11              UCSD: Self-administered rating of dyspnea associated with activities of daily living (ADLs) 6-point scale (0 = "not at all" to 5 = "maximal or unable to do because of breathlessness")  Scoring Scores range from 0 to 120.  Minimally important difference is 5 units  CAT: CAT can identify the health impairment of COPD patients and is better correlated with disease progression.  CAT has a scoring range of zero to 40. The CAT score is classified into four groups of low (less than 10), medium (10 - 20), high (21-30) and very high (31-40) based  on the impact level of disease on health status. A CAT score over 10 suggests significant symptoms.  A worsening CAT score could be explained by an exacerbation, poor medication adherence, poor inhaler technique, or progression of COPD or comorbid conditions.  CAT MCID is 2 points  mMRC: mMRC (Modified Medical Research Council) Dyspnea Scale is used to assess the degree of baseline functional disability in patients of respiratory disease due to dyspnea. No minimal important difference is established. A decrease in score of 1 point or greater is considered a positive change.   Pulmonary Function Assessment:   Exercise Target Goals: Exercise Program Goal: Individual exercise prescription set using results from initial 6 min walk test and THRR while considering  patient's activity barriers and safety.   Exercise Prescription Goal: Initial exercise prescription builds to 30-45 minutes a day of aerobic activity, 2-3 days per week.  Home exercise guidelines will be given to patient during program as part of exercise prescription that the participant will acknowledge.  Education: Aerobic Exercise: - Group verbal and visual presentation on the components of exercise prescription. Introduces F.I.T.T principle from ACSM for exercise prescriptions.  Reviews F.I.T.T. principles of aerobic exercise including progression. Written material given at graduation.   Education: Resistance Exercise: - Group verbal and visual presentation on the components of exercise prescription. Introduces F.I.T.T principle from ACSM for exercise prescriptions  Reviews F.I.T.T. principles of resistance exercise including progression. Written material given at graduation.    Education: Exercise & Equipment Safety: - Individual verbal instruction and demonstration of equipment use and safety with use of the equipment. Flowsheet Row Pulmonary Rehab from 12/05/2022 in [facility] Cardiac and Pulmonary Rehab  Education need identified  12/05/22  Date 12/05/22  Educator KW  Instruction Review Code 1- Verbalizes Understanding       Education: Exercise Physiology & General Exercise Guidelines: - Group verbal and written instruction with models to review the exercise physiology of the cardiovascular system and associated critical values. Provides general exercise guidelines with specific guidelines to those with heart or lung disease.    Education: Flexibility, Balance, Mind/Body Relaxation: - Group verbal and visual presentation with interactive activity on the components of exercise prescription. Introduces F.I.T.T  principle from ACSM for exercise prescriptions. Reviews F.I.T.T. principles of flexibility and balance exercise training including progression. Also discusses the mind body connection.  Reviews various relaxation techniques to help reduce and manage stress (i.e. Deep breathing, progressive muscle relaxation, and visualization). Balance handout provided to take home. Written material given at graduation.   Activity Barriers & Risk Stratification:   6 Minute Walk:  6 Minute Walk     Row Name 06/17/23 0754         6 Minute Walk   Phase Discharge     Distance 1120 feet     Distance % Change 25.8 %     Distance Feet Change 230 ft     Walk Time 6 minutes     # of Rest Breaks 0     MPH 2.12     METS 2.39     RPE 11     Perceived Dyspnea  1     VO2 Peak 8.35     Symptoms No     Resting HR 82 bpm     Resting BP 134/76     Resting Oxygen Saturation  98 %     Exercise Oxygen Saturation  during 6 min walk 90 %     Max Ex. HR 104 bpm     Max Ex. BP 144/78     2 Minute Post BP 126/70       Interval HR   1 Minute HR 95     2 Minute HR 94     3 Minute HR 99     4 Minute HR 99     5 Minute HR 103     6 Minute HR 104     2 Minute Post HR 93     Interval Heart Rate? Yes       Interval Oxygen   Interval Oxygen? Yes     Baseline Oxygen Saturation % 98 %     1 Minute Oxygen Saturation % 93 %     1  Minute Liters of Oxygen 2 L  pulsed     2 Minute Oxygen Saturation % 90 %     2 Minute Liters of Oxygen 2 L     3 Minute Oxygen Saturation % 90 %     3 Minute Liters of Oxygen 2 L     4 Minute Oxygen Saturation % 91 %     4 Minute Liters of Oxygen 2 L     5 Minute Oxygen Saturation % 92 %     5 Minute Liters of Oxygen 2 L     6 Minute Oxygen Saturation % 90 %     6 Minute Liters of Oxygen 2 L     2 Minute Post Oxygen Saturation % 97 %     2 Minute Post Liters of Oxygen 2 L             Oxygen Initial Assessment:   Oxygen Re-Evaluation:  Oxygen Re-Evaluation     Row Name 02/18/23 0815 02/25/23 0741 05/13/23 0844 05/29/23 0741 06/19/23 0821     Program Oxygen Prescription   Program Oxygen Prescription Continuous Continuous Continuous Continuous Continuous   Liters per minute 2 2 2 2 2      Home Oxygen   Home Oxygen Device Portable Concentrator;Home Concentrator Portable Concentrator;Home Concentrator Portable Concentrator;Home Concentrator Portable Concentrator;Home Concentrator;E-Tanks Portable Concentrator;Home Concentrator;E-Tanks   Sleep Oxygen Prescription Continuous;CPAP Continuous;CPAP Continuous;CPAP Continuous;CPAP Continuous;CPAP  wears CPAP during day for 5 hours.  does not tolerate at night   Liters per minute 2 2 2 2 2    Home Exercise Oxygen Prescription Continuous Continuous Continuous Continuous Continuous   Liters per minute 2 2 2 2 2    Home Resting Oxygen Prescription Continuous Continuous Continuous Continuous Continuous   Liters per minute 2 2 2 2 2    Compliance with Home Oxygen Use Yes Yes Yes Yes Yes     Goals/Expected Outcomes   Short Term Goals To learn and demonstrate proper pursed lip breathing techniques or other breathing techniques.  To learn and demonstrate proper pursed lip breathing techniques or other breathing techniques. ;To learn and exhibit compliance with exercise, home and travel O2 prescription;To learn and understand importance of  monitoring SPO2 with pulse oximeter and demonstrate accurate use of the pulse oximeter.;To learn and understand importance of maintaining oxygen saturations>88%;To learn and demonstrate proper use of respiratory medications To learn and demonstrate proper pursed lip breathing techniques or other breathing techniques. ;To learn and exhibit compliance with exercise, home and travel O2 prescription;To learn and understand importance of monitoring SPO2 with pulse oximeter and demonstrate accurate use of the pulse oximeter.;To learn and understand importance of maintaining oxygen saturations>88%;To learn and demonstrate proper use of respiratory medications To learn and understand importance of maintaining oxygen saturations>88%;To learn and understand importance of monitoring SPO2 with pulse oximeter and demonstrate accurate use of the pulse oximeter. To learn and understand importance of maintaining oxygen saturations>88%   Long  Term Goals Exhibits proper breathing techniques, such as pursed lip breathing or other method taught during program session Exhibits compliance with exercise, home  and travel O2 prescription;Verbalizes importance of monitoring SPO2 with pulse oximeter and return demonstration;Maintenance of O2 saturations>88%;Exhibits proper breathing techniques, such as pursed lip breathing or other method taught during program session;Compliance with respiratory medication Exhibits compliance with exercise, home  and travel O2 prescription;Verbalizes importance of monitoring SPO2 with pulse oximeter and return demonstration;Maintenance of O2 saturations>88%;Exhibits proper breathing techniques, such as pursed lip breathing or other method taught during program session;Compliance with respiratory medication Maintenance of O2 saturations>88%;Verbalizes importance of monitoring SPO2 with pulse oximeter and return demonstration Verbalizes importance of monitoring SPO2 with pulse oximeter and return  demonstration;Maintenance of O2 saturations>88%   Comments Reviewed PLB technique with pt.  Talked about how it works and it's importance in maintaining their exercise saturations. Patient reports compliance with home oxygen use and respiratory medications. He does have a pulse ox at home and consistently monitors SaO2 levels and reports that they ususally stay above 88%. He reports that if he gets SOB or notices decreases in SaO2 levels he feels comfortable using PLB. Reviewd PLB technique with patient today. Annette Stable states that he has continues to practice his PLB with exercise. He also has a pulse oximeter at home and his beeen checking his O2 saturations. He reports that his O2 occasionally drops below 90% during exercise but he states that it recovers quickly with rest. Bill has a pulse oximeter to check his oxygen saturation at home. Informed and explained why it is important to have one. Reviewed that oxygen saturations should be 88 percent and above. He keeps multiple pulse oximeters around the house and his car to keep track of his oxygen. Annette Stable has a pulse oximeter to check his oxygen saturation at home. Informed and explained why it is important to have one. Reviewed that oxygen saturations should be 88 percent and above. He keeps multiple pulse oximeters around the house and his car to keep  track of his oxygen.  He watches his oxygen saturations when he exerts himself and stops if he sees the oximeter reading below 88%.   Goals/Expected Outcomes Short: Become more profiecient at using PLB. Long: Become independent at using PLB. Short: continue to attend pulmonary rehab consistently to work on strength and lung function. Long: continue to use PLB independently Short: Continue to monitor O2 saturations at home. Long: Continue to use PLB independently Short: monitor oxygen at home with exertion. Long: maintain oxygen saturations above 88 percent independently. Short: Continue to monitor oxygen at home with  exertion. Long: maintain oxygen saturations above 88 percent independently.            Oxygen Discharge (Final Oxygen Re-Evaluation):  Oxygen Re-Evaluation - 06/19/23 0821       Program Oxygen Prescription   Program Oxygen Prescription Continuous    Liters per minute 2      Home Oxygen   Home Oxygen Device Portable Concentrator;Home Concentrator;E-Tanks    Sleep Oxygen Prescription Continuous;CPAP   wears CPAP during day for 5 hours.  does not tolerate at night   Liters per minute 2    Home Exercise Oxygen Prescription Continuous    Liters per minute 2    Home Resting Oxygen Prescription Continuous    Liters per minute 2    Compliance with Home Oxygen Use Yes      Goals/Expected Outcomes   Short Term Goals To learn and understand importance of maintaining oxygen saturations>88%    Long  Term Goals Verbalizes importance of monitoring SPO2 with pulse oximeter and return demonstration;Maintenance of O2 saturations>88%    Comments Bill has a pulse oximeter to check his oxygen saturation at home. Informed and explained why it is important to have one. Reviewed that oxygen saturations should be 88 percent and above. He keeps multiple pulse oximeters around the house and his car to keep track of his oxygen.  He watches his oxygen saturations when he exerts himself and stops if he sees the oximeter reading below 88%.    Goals/Expected Outcomes Short: Continue to monitor oxygen at home with exertion. Long: maintain oxygen saturations above 88 percent independently.             Initial Exercise Prescription:   Perform Capillary Blood Glucose checks as needed.  Exercise Prescription Changes:   Exercise Prescription Changes     Row Name 03/05/23 1000 03/19/23 1300 04/01/23 1400 04/03/23 0900 04/17/23 1100     Response to Exercise   Blood Pressure (Admit) 110/60 112/60 104/60 -- 102/60   Blood Pressure (Exercise) 142/66 140/70 142/60 -- 130/68   Blood Pressure (Exit) 106/62  110/62 108/58 -- 120/62   Heart Rate (Admit) 91 bpm 108 bpm 110 bpm -- 89 bpm   Heart Rate (Exercise) 122 bpm 124 bpm 127 bpm -- 111 bpm   Heart Rate (Exit) 114 bpm 116 bpm 123 bpm -- 106 bpm   Oxygen Saturation (Admit) 95 % 90 % 95 % -- 90 %   Oxygen Saturation (Exercise) 96 % 89 % 91 % -- 98 %   Oxygen Saturation (Exit) 94 % 95 % 96 % -- 98 %   Rating of Perceived Exertion (Exercise) 15 13 15  -- 11   Perceived Dyspnea (Exercise) 2 2 0 -- 1   Symptoms -- SOB SOB -- SOB   Duration -- -- Progress to 30 minutes of  aerobic without signs/symptoms of physical distress -- Progress to 30 minutes of  aerobic without signs/symptoms of  physical distress   Intensity -- -- THRR unchanged -- THRR unchanged     Progression   Progression -- -- Continue to progress workloads to maintain intensity without signs/symptoms of physical distress. -- Continue to progress workloads to maintain intensity without signs/symptoms of physical distress.   Average METs -- 1.8 2.01 -- 2.09     Resistance Training   Training Prescription Yes Yes Yes -- Yes   Weight 4 lb 4 lb 4 lb -- 4 lb   Reps 10-15 10-15 10-15 -- 10-15     Interval Training   Interval Training -- -- No -- No     Oxygen   Oxygen Continuous Continuous Continuous -- Continuous   Liters 2 2 2  -- 2     Recumbant Bike   Level 2 2 -- -- --   Watts 18 18 -- -- --   Minutes 15 15 -- -- --   METs 2.68 2.69 -- -- --     NuStep   Level 3 -- -- -- 2   Minutes 15 -- -- -- 15   METs 2.1 -- -- -- 2.09     T5 Nustep   Level -- -- -- -- 2   Minutes -- -- -- -- 15   METs -- -- -- -- 1.8     Biostep-RELP   Level 1 2 3  -- --   SPM -- 50 -- -- --   Minutes 15 15 15  -- --   METs 2 2 2  -- --     Track   Laps 19 25 19  -- 28   Minutes 15 15 15  -- 15   METs 2.03 2.36 2.03 -- 2.52     Home Exercise Plan   Plans to continue exercise at -- -- -- Home (comment)  Walking at home around [employer] or at the mall Home (comment)  Walking at home around  [facility] or at [employer] -- -- -- Add 3 additional days to program exercise sessions. Add 3 additional days to program exercise sessions.   Initial Home Exercises Provided -- -- -- 04/03/23 04/03/23     Oxygen   Maintain Oxygen Saturation 88% or higher 88% or higher 88% or higher 88% or higher 88% or higher    Row Name 05/01/23 1600 05/15/23 1700 05/29/23 1400 06/12/23 1300 06/26/23 0900     Response to Exercise   Blood Pressure (Admit) 122/72 100/60 128/60 122/66 122/70   Blood Pressure (Exit) 122/68 132/76 114/72 110/50 124/72   Heart Rate (Admit) 82 bpm 103 bpm 90 bpm 93 bpm 84 bpm   Heart Rate (Exercise) 113 bpm 120 bpm 111 bpm 108 bpm 116 bpm   Heart Rate (Exit) 104 bpm 108 bpm 96 bpm 101 bpm 90 bpm   Oxygen Saturation (Admit) 88 % 95 % 92 % 95 % 97 %   Oxygen Saturation (Exercise) 87 % 90 % 87 % 90 % 90 %   Oxygen Saturation (Exit) 96 % 97 % 92 % 98 % 98 %   Rating of Perceived Exertion (Exercise) 13 12 12 13 12    Perceived Dyspnea (Exercise) 1 1 0 2 0   Symptoms SOB SOB SOB SOB SOB   Duration Progress to 30 minutes of  aerobic without signs/symptoms of physical distress Progress to 30 minutes of  aerobic without signs/symptoms of physical distress Progress to 30 minutes of  aerobic without signs/symptoms of physical distress Progress to 30 minutes of  aerobic  without signs/symptoms of physical distress Progress to 30 minutes of  aerobic without signs/symptoms of physical distress   Intensity THRR unchanged THRR unchanged THRR unchanged THRR unchanged THRR unchanged     Progression   Progression Continue to progress workloads to maintain intensity without signs/symptoms of physical distress. Continue to progress workloads to maintain intensity without signs/symptoms of physical distress. Continue to progress workloads to maintain intensity without signs/symptoms of physical distress. Continue to progress workloads to maintain intensity without signs/symptoms of  physical distress. Continue to progress workloads to maintain intensity without signs/symptoms of physical distress.   Average METs 2.28 2.3 2.47 2.3 2.12     Resistance Training   Training Prescription Yes Yes Yes Yes Yes   Weight 4 lb 4 lb 4 lb 4 lb 4 lb   Reps 10-15 10-15 10-15 10-15 10-15     Interval Training   Interval Training No No No No No     Oxygen   Oxygen Continuous Continuous Continuous Continuous Continuous   Liters 2 2 2 2 2      Recumbant Bike   Level 2 2 2 2  --   Watts 15 15 18 16  --   Minutes 15 15 15 15  --   METs 2.58 2.58 2.7 2.62 --     NuStep   Level 3 3 3 2 2    Minutes 15 15 15 15 15    METs 2.3 2.2 3 1.9 2     T5 Nustep   Level -- -- -- -- 1   Minutes -- -- -- -- 15   METs -- -- -- -- 1.8     Biostep-RELP   Level -- -- -- -- 2   Minutes -- -- -- -- 15   METs -- -- -- -- 2     Track   Laps 24 28 25 25 26    Minutes 15 15 15 15 15    METs 2.31 2.52 2.36 2.36 2.41     Home Exercise Plan   Plans to continue exercise at Home (comment)  Walking at home around [facility] or at the mall Home (comment)  Walking at home around [employer] or at the mall Home (comment)  Walking at home around [employer] or at [employer] (comment)  Walking at home around [employer] or at [employer] (comment)  Walking at home around [employer] or at [employer] Add 3 additional days to program exercise sessions. Add 3 additional days to program exercise sessions. Add 3 additional days to program exercise sessions. Add 3 additional days to program exercise sessions. Add 3 additional days to program exercise sessions.   Initial Home Exercises Provided 04/03/23 04/03/23 04/03/23 04/03/23 04/03/23     Oxygen   Maintain Oxygen Saturation 88% or higher 88% or higher 88% or higher 88% or higher 88% or higher            Exercise Comments:   Exercise Comments     Row Name 02/18/23 8295           Exercise Comments First full day of  exercise!  Patient was oriented to gym and equipment including functions, settings, policies, and procedures.  Patient's individual exercise prescription and treatment plan were reviewed.  All starting workloads were established based on the results of the 6 minute walk test done at initial orientation visit.  The plan for exercise progression was also introduced and progression will be customized based on patient's performance and  goals.                Exercise Goals and Review:   Exercise Goals Re-Evaluation :  Exercise Goals Re-Evaluation     Row Name 02/18/23 5784 02/25/23 0737 03/05/23 1011 03/19/23 1332 04/01/23 1450     Exercise Goal Re-Evaluation   Exercise Goals Review Able to understand and use rate of perceived exertion (RPE) scale;Able to understand and use Dyspnea scale;Understanding of Exercise Prescription Increase Physical Activity;Increase Strength and Stamina Increase Physical Activity;Understanding of Exercise Prescription;Increase Strength and Stamina Increase Physical Activity;Increase Strength and Stamina;Understanding of Exercise Prescription Increase Physical Activity;Increase Strength and Stamina;Understanding of Exercise Prescription   Comments Reviewed RPE  and dyspnea scale, THR and program prescription with pt today.  Pt voiced understanding and was given a copy of goals to take home. After Bill's orientation he was out for an extended period of time due to health issues and hospitalization. He has now resently started the program and is attending consistently. He is tolerating exercise well and getting established in his exercise routine. Annette Stable has started his exercise sessions. He has increased his Nustep T4 level to 3 and his handweight to 4 LBs. His RPE is 12 (one at 15 on the Rec Bike) . Will contiue to encourage him to increase workloads as tolerated and  tomonitor his exericse progression. Annette Stable is doing well in rehab. He increased his track laps to 25. He also  continues to do well with the 4 lb weights. We will continue to monitor his progress in the program. Annette Stable is doing well in rehab. He has only attended one session since the last review. He continues to work at level 4 on [employer] and walk the track. We will continue to monitor his progress in the program upon return.   Expected Outcomes Short: Use RPE daily to regulate intensity. Long: Follow program prescription in THR. Short: attend pulmonary rehab consistently and continue to get estalished in exercise routine. Long: become independent with exercise routine. ONG:[id number] workloads as tolerated with goal of increased stamina and strength.   LTG: Continued exercise progression as tolerated during program and after discharge. Short: Continue to follow current exercise prescription, and progressively increase workloads. Long: Continue exercise to improve strength and stamina. Short: Return to the program once cleared and able. Long: Continue exercise to improve strength and stamina.    Row Name 04/03/23 0845 04/17/23 1129 05/01/23 1654 05/13/23 0829 05/15/23 1721     Exercise Goal Re-Evaluation   Exercise Goals Review Increase Physical Activity;Increase Strength and Stamina;Understanding of Exercise Prescription Increase Physical Activity;Increase Strength and Stamina;Understanding of Exercise Prescription Increase Physical Activity;Increase Strength and Stamina;Understanding of Exercise Prescription Increase Physical Activity;Increase Strength and Stamina;Understanding of Exercise Prescription Increase Physical Activity;Increase Strength and Stamina;Understanding of Exercise Prescription   Comments Reviewed home exercise with pt today.  Pt plans to walk every morning around the mall or around [facility] for exercise. Reviewed THR, pulse, RPE, sign and symptoms, pulse oximetery and when to call 911 or MD.  Also discussed weather considerations and indoor options.  Pt voiced understanding. Annette Stable  continues to do well in rehab. He recently increased his track laps to 28 from 22. He also added the T5 nustep at level 2 to his prescription. We will continue to monitor his progress in the program. Annette Stable continues to do well in rehab. He recently increased his level on the T4 nustep from 2 to 3. He has maintained his intensity on the recumbent bike and track.  We will continue to monitor his progress in the program. Annette Stable states that he has been doing well with exercise. He reports noticing improvement with his strength and stamina, as well as his shortness of breath during exercise. On his days away from rehab he has been walking for 45 minutes at the [facility] in [place]. He states that he stops walking when his O2 saturations drop below 90%, but they usually recover quickly within 30 seconds. We will continue to monitor his progress in the program. Annette Stable continues to do well in rehab. He has been able to maintain his recumbent bike intensity at level 2. He has also increased his track laps from 21 to 28. We will continue to monitor his progress in the program.   Expected Outcomes Short: Continue exercise outside of the program. Long: Continue exercise to improve strength and stamina. Short: Continue to follow current exercise prescription, and progressively increase workloads. Long: Continue exercise to improve strength and stamina. Short: Continue to follow current exercise prescription, and progressively increase workloads. Long: Continue exercise to improve strength and stamina. Short: Continue to walk at the mall on days away from rehab. Long: Continue to exercise independently. Short: Continue to increase workload on the Recumbent bike. Long: continue to exercise to improve strength and stamina.    Row Name 05/29/23 1449 06/12/23 1348 06/19/23 0759 06/26/23 1011       Exercise Goal Re-Evaluation   Exercise Goals Review Increase Physical Activity;Increase Strength and Stamina;Understanding of  Exercise Prescription Increase Physical Activity;Increase Strength and Stamina;Understanding of Exercise Prescription Increase Strength and Stamina;Increase Physical Activity Increase Strength and Stamina;Increase Physical Activity;Understanding of Exercise Prescription    Comments Annette Stable continues to work hard in the program. He has been able to increase his wattage on the bike from 15 watts to 18 on a level of 2. He has also been able to roughly maintain his number of track laps in 15 minutes. We will continue to monitor his progress in the program. Annette Stable continues to work hard in the program. He has been able to maintain his intensity on the Track at 25 laps in 15 minutes, and the recumbent bike at level 2. We will continue to monitor his progress in the program. Annette Stable is walking at the mall for 45 minutes when he is not here. He is actively looking for a place to continue his exercise routine after discharge. He is considering the [employer]   He continues to work on Hydrologist while in his exericse sessions Annette Stable is doing well and is close to graduating from the program. He recently completed his post and improved by 25.8%! He has stayed consistent with his workloads on seated machines as well as walking the track. We will continue to monitor his progress until he graduates from the program.    Expected Outcomes Short: Continue to increase workload on the Recumbent bike, and T4 nustep. Long: continue to exercise to improve strength and stamina. Short: Continue to increase workload on the Recumbent bike, and T4 nustep. Long: continue to exercise to improve strength and stamina. STG Continue to walk at the mall and find a place to exercise after discharge  LTG  Annette Stable is able to maintain his exewrcise after discharge Short: Graduate. Long: Continue to exercise independently.             Discharge Exercise Prescription (Final Exercise Prescription Changes):  Exercise Prescription Changes -  06/26/23 0900       Response to Exercise  Blood Pressure (Admit) 122/70    Blood Pressure (Exit) 124/72    Heart Rate (Admit) 84 bpm    Heart Rate (Exercise) 116 bpm    Heart Rate (Exit) 90 bpm    Oxygen Saturation (Admit) 97 %    Oxygen Saturation (Exercise) 90 %    Oxygen Saturation (Exit) 98 %    Rating of Perceived Exertion (Exercise) 12    Perceived Dyspnea (Exercise) 0    Symptoms SOB    Duration Progress to 30 minutes of  aerobic without signs/symptoms of physical distress    Intensity THRR unchanged      Progression   Progression Continue to progress workloads to maintain intensity without signs/symptoms of physical distress.    Average METs 2.12      Resistance Training   Training Prescription Yes    Weight 4 lb    Reps 10-15      Interval Training   Interval Training No      Oxygen   Oxygen Continuous    Liters 2      NuStep   Level 2    Minutes 15    METs 2      T5 Nustep   Level 1    Minutes 15    METs 1.8      Biostep-RELP   Level 2    Minutes 15    METs 2      Track   Laps 26    Minutes 15    METs 2.41      Home Exercise Plan   Plans to continue exercise at Home (comment)   Walking at home around [facility] or at the mall   Frequency Add 3 additional days to program exercise sessions.    Initial Home Exercises Provided 04/03/23      Oxygen   Maintain Oxygen Saturation 88% or higher             Nutrition:  Target Goals: Understanding of nutrition guidelines, daily intake of sodium 1500mg , cholesterol 200mg , calories 30% from fat and 7% or less from saturated fats, daily to have 5 or more servings of fruits and vegetables.  Education: All About Nutrition: -Group instruction provided by verbal, written material, interactive activities, discussions, models, and posters to present general guidelines for heart healthy nutrition including fat, fiber, MyPlate, the role of sodium in heart healthy nutrition, utilization of the  nutrition label, and utilization of this knowledge for meal planning. Follow up email sent as well. Written material given at graduation. Flowsheet Row Pulmonary Rehab from 12/05/2022 in [facility] Cardiac and Pulmonary Rehab  Education need identified 12/05/22       Biometrics:   Post Biometrics - 06/17/23 0803        Post  Biometrics   Height 5' 11.5" (1.816 m)    Weight 178 lb 14.4 oz (81.1 kg)    Waist Circumference 42 inches    Hip Circumference 40 inches    Waist to Hip Ratio 1.05 %    BMI (Calculated) 24.61    Single Leg Stand 7.4 seconds             Nutrition Therapy Plan and Nutrition Goals:   Nutrition Assessments:  MEDIFICTS Score Key: >=70 Need to make dietary changes  40-70 Heart Healthy Diet <= 40 Therapeutic Level Cholesterol Diet  Flowsheet Row Pulmonary Rehab from [facility]19/20[facility] in [facility] Cardiac and Pulmonary Rehab  Picture Your Plate Total Score on Discharge 55      Picture  Your Plate Scores: <40 Unhealthy dietary pattern with much room for improvement. 41-50 Dietary pattern unlikely to meet recommendations for good health and room for improvement. 51-60 More healthful dietary pattern, with some room for improvement.  >60 Healthy dietary pattern, although there may be some specific behaviors that could be improved.   Nutrition Goals Re-Evaluation:  Nutrition Goals Re-Evaluation     Row Name 02/25/23 0747 05/13/23 0835 05/29/23 0745 06/19/23 0748       Goals   Current Weight -- -- 179 lb (81.2 kg) --    Comment Patient decided to cancel RD apt and defer meeting for now. He plans to attend the nutrition group education class in the future. Annette Stable has not met with the RD and states he feels he does not need to at this time. He states that his wife is very good at helping him with his nutrition as he states "she could write a book." He has cut out junk food and continues to work towards a healthy diet. Patient was informed on why it is important to maintain a  balanced diet when dealing with Respiratory issues. Explained that it takes a lot of energy to breath and when they are short of breath often they will need to have a good diet to help keep up with the calories they are expending for breathing. Annette Stable continues to work on eating healthy. less sodium, more fresh fruits and vegs. has has 3 lbs weight loss since entering the program.  his wife is great to help him maintain the healthy nutrition plan.  He has a smoothie every morning.    Expected Outcome -- -- Short: Choose and plan snacks accordingly to patients caloric intake to improve breathing. Long: Maintain a diet independently that meets their caloric intake to aid in daily shortness of breath. STG continue the nutrition plan he is using daily  LTG maintains his eating habits.             Nutrition Goals Discharge (Final Nutrition Goals Re-Evaluation):  Nutrition Goals Re-Evaluation - 06/19/23 0748       Goals   Comment Bill continues to work on eating healthy. less sodium, more fresh fruits and vegs. has has 3 lbs weight loss since entering the program.  his wife is great to help him maintain the healthy nutrition plan.  He has a smoothie every morning.    Expected Outcome STG continue the nutrition plan he is using daily  LTG maintains his eating habits.             Psychosocial: Target Goals: Acknowledge presence or absence of significant depression and/or stress, maximize coping skills, provide positive support system. Participant is able to verbalize types and ability to use techniques and skills needed for reducing stress and depression.   Education: Stress, Anxiety, and Depression - Group verbal and visual presentation to define topics covered.  Reviews how body is impacted by stress, anxiety, and depression.  Also discusses healthy ways to reduce stress and to treat/manage anxiety and depression.  Written material given at graduation.   Education: Sleep Hygiene -Provides  group verbal and written instruction about how sleep can affect your health.  Define sleep hygiene, discuss sleep cycles and impact of sleep habits. Review good sleep hygiene tips.    Initial Review & Psychosocial Screening:   Quality of Life Scores:  Scores of 19 and below usually indicate a poorer quality of life in these areas.  A difference of  2-3 points is  a clinically meaningful difference.  A difference of 2-3 points in the total score of the Quality of Life Index has been associated with significant improvement in overall quality of life, self-image, physical symptoms, and general health in studies assessing change in quality of life.  PHQ-9: Review Flowsheet  More data exists      06/19/2023 12/05/2022 07/01/2022 05/01/2022 07/01/2019  Depression screen PHQ 2/9  Decreased Interest 0 0 0 0 0  Down, Depressed, Hopeless 0 0 0 0 0  PHQ - 2 Score 0 0 0 0 0  Altered sleeping 0 0 0 0 -  Tired, decreased energy 0 1 0 0 -  Change in appetite 0 0 0 0 -  Feeling bad or failure about yourself  1 0 0 0 -  Trouble concentrating 0 0 0 0 -  Moving slowly or fidgety/restless 0 0 0 0 -  Suicidal thoughts 0 0 0 0 -  PHQ-9 Score 1 1 0 0 -  Difficult doing work/chores Not difficult at all Not difficult at all Not difficult at all Not difficult at all -    Details           Interpretation of Total Score  Total Score Depression Severity:  1-4 = Minimal depression, 5-9 = Mild depression, 10-14 = Moderate depression, 15-19 = Moderately severe depression, 20-27 = Severe depression   Psychosocial Evaluation and Intervention:   Psychosocial Re-Evaluation:  Psychosocial Re-Evaluation     Row Name 02/25/23 [phone] 05/13/23 8657 05/29/23 0746 06/19/23 0745       Psychosocial Re-Evaluation   Current issues with None Identified None Identified None Identified None Identified    Comments Patient reports no new concerns or changes in sleep, stress, or mental health. He has recently started the  program after a time gap between orientation and first day of class due to health concerns. He reports he is enjoying coming to class. Bill reports no current stressors at this time. He also has been getting good sleep and using his CPAP. He states that his wife and family are a good support system for him. He also enjoys playing cards with friends and exercising for stress relief. Patient reports no issues with their current mental states, sleep, stress, depression or anxiety. Will follow up with patient in a few weeks for any changes. Annette Stable continues without any stress, depression or anxiety.     Expected Outcomes Short: continue to attend pulmonary rehab consistenly for mental health benefit. Long: maintain good mental health habits. Short: Continue to exercise for mental boost. Long: Maintain positive outlook. Short: Continue to exercise regularly to support mental health and notify staff of any changes. Long: maintain mental health and well being through teaching of rehab or prescribed medications independently. Short: Continue to exercise regularly to support mental health and notify staff of any changes. Long: maintain mental health and well being through teaching of rehab or prescribed medications independently.    Interventions Encouraged to attend Pulmonary Rehabilitation for the exercise Encouraged to attend Pulmonary Rehabilitation for the exercise Encouraged to attend Pulmonary Rehabilitation for the exercise Encouraged to attend Pulmonary Rehabilitation for the exercise    Continue Psychosocial Services  Follow up required by staff Follow up required by staff Follow up required by staff Follow up required by staff             Psychosocial Discharge (Final Psychosocial Re-Evaluation):  Psychosocial Re-Evaluation - 06/19/23 0745       Psychosocial Re-Evaluation   Current issues  with None Identified    Comments Annette Stable continues without any stress, depression or anxiety.     Expected  Outcomes Short: Continue to exercise regularly to support mental health and notify staff of any changes. Long: maintain mental health and well being through teaching of rehab or prescribed medications independently.    Interventions Encouraged to attend Pulmonary Rehabilitation for the exercise    Continue Psychosocial Services  Follow up required by staff             Education: Education Goals: Education classes will be provided on a weekly basis, covering required topics. Participant will state understanding/return demonstration of topics presented.  Learning Barriers/Preferences:   General Pulmonary Education Topics:  Infection Prevention: - Provides verbal and written material to individual with discussion of infection control including proper hand washing and proper equipment cleaning during exercise session. Flowsheet Row Pulmonary Rehab from 12/05/2022 in [facility] Cardiac and Pulmonary Rehab  Education need identified 12/05/22  Date 12/05/22  Educator KW  Instruction Review Code 1- Verbalizes Understanding       Falls Prevention: - Provides verbal and written material to individual with discussion of falls prevention and safety. Flowsheet Row Pulmonary Rehab from 12/05/2022 in [facility] Cardiac and Pulmonary Rehab  Education need identified 12/05/22  Date 12/05/22  Educator KW  Instruction Review Code 1- Verbalizes Understanding       Chronic Lung Disease Review: - Group verbal instruction with posters, models, PowerPoint presentations and videos,  to review new updates, new respiratory medications, new advancements in procedures and treatments. Providing information on websites and "800" numbers for continued self-education. Includes information about supplement oxygen, available portable oxygen systems, continuous and intermittent flow rates, oxygen safety, concentrators, and Medicare reimbursement for oxygen. Explanation of Pulmonary Drugs, including class, frequency,  complications, importance of spacers, rinsing mouth after steroid MDI's, and proper cleaning methods for nebulizers. Review of basic lung anatomy and physiology related to function, structure, and complications of lung disease. Review of risk factors. Discussion about methods for diagnosing sleep apnea and types of masks and machines for OSA. Includes a review of the use of types of environmental controls: home humidity, furnaces, filters, dust mite/pet prevention, HEPA vacuums. Discussion about weather changes, air quality and the benefits of nasal washing. Instruction on Warning signs, infection symptoms, calling MD promptly, preventive modes, and value of vaccinations. Review of effective airway clearance, coughing and/or vibration techniques. Emphasizing that all should Create an Action Plan. Written material given at graduation. Flowsheet Row Pulmonary Rehab from 12/05/2022 in [facility] Cardiac and Pulmonary Rehab  Education need identified 12/05/22       AED/CPR: - Group verbal and written instruction with the use of models to demonstrate the basic use of the AED with the basic ABC's of resuscitation.    Anatomy and Cardiac Procedures: - Group verbal and visual presentation and models provide information about basic cardiac anatomy and function. Reviews the testing methods done to diagnose heart disease and the outcomes of the test results. Describes the treatment choices: Medical Management, Angioplasty, or Coronary Bypass Surgery for treating various heart conditions including Myocardial Infarction, Angina, Valve Disease, and Cardiac Arrhythmias.  Written material given at graduation.   Medication Safety: - Group verbal and visual instruction to review commonly prescribed medications for heart and lung disease. Reviews the medication, class of the drug, and side effects. Includes the steps to properly store meds and maintain the prescription regimen.  Written material given at  graduation.   Other: -Provides group and verbal instruction  on various topics (see comments)   Knowledge Questionnaire Score:  Knowledge Questionnaire Score - 06/19/23 0807       Knowledge Questionnaire Score   Pre Score 16/18    Post Score 15/18              Core Components/Risk Factors/Patient Goals at Admission:   Education:Diabetes - Individual verbal and written instruction to review signs/symptoms of diabetes, desired ranges of glucose level fasting, after meals and with exercise. Acknowledge that pre and post exercise glucose checks will be done for 3 sessions at entry of program.   Know Your Numbers and Heart Failure: - Group verbal and visual instruction to discuss disease risk factors for cardiac and pulmonary disease and treatment options.  Reviews associated critical values for Overweight/Obesity, Hypertension, Cholesterol, and Diabetes.  Discusses basics of heart failure: signs/symptoms and treatments.  Introduces Heart Failure Zone chart for action plan for heart failure.  Written material given at graduation.   Core Components/Risk Factors/Patient Goals Review:   Goals and Risk Factor Review     Row Name 02/25/23 0757 05/13/23 0837 05/29/23 0744 06/19/23 0826       Core Components/Risk Factors/Patient Goals Review   Personal Goals Review Hypertension;Lipids Hypertension;Improve shortness of breath with ADL's Improve shortness of breath with ADL's Weight Management/Obesity;Hypertension;Lipids;Improve shortness of breath with ADL's    Review Patient reports that he consistently takes all BP and cholesterol meds and has a good routine with his medications and is currently not having any problems with meds. He checks his BP every day at home 2 times a day. He just got a new BP machine. Annette Stable continues to check his BP and reports his reading have been within normal ranges. He has been taking all his medications as prescribed. He also reports that his shortness of  breath has improved with ADLs as he states he previously could not walk to the mailbox without O2. Now he is able to not be on O2 while doing activities at home, although he states he does not leave the house without his O2. Spoke to patient about their shortness of breath and what they can do to improve. Patient has been informed of breathing techniques when starting the program. Patient is informed to tell staff if they have had any med changes and that certain meds they are taking or not taking can be causing shortness of breath. Annette Stable has lost about 3 lbs since statring the program. He is working on eating less sodium, more fruit and vegs. He has a smoothie every morning that is full of fruit. He ststes that he can walk to end of driveway now without his oxygen saturations dropping. He continues to take his meds, work on healthy eating habits and exercising at least 5 days a week.    Expected Outcomes Short: continue to monitor BP at home and take all meds as prescribed. Long: continue to control risk factors with meds and healthy lifestyle. Short: Continue to monitor BP at home and take all meds as prescribed. Long: Continue to monitor lifestyle risk factors. Short: Attend [facility] regularly to improve shortness of breath with ADL's. Long: maintain independence with ADL's STG  Continue with exercise regimen and using breathing techniques to enable walking further distance without dropping oxygen saturations. Continue eating healthy and exercising for weight, blood pressure and lipid control.             Core Components/Risk Factors/Patient Goals at Discharge (Final Review):   Goals and Risk Factor Review -  06/19/23 0826       Core Components/Risk Factors/Patient Goals Review   Personal Goals Review Weight Management/Obesity;Hypertension;Lipids;Improve shortness of breath with ADL's    Review Annette Stable has lost about 3 lbs since statring the program. He is working on eating less sodium, more fruit and  vegs. He has a smoothie every morning that is full of fruit. He ststes that he can walk to end of driveway now without his oxygen saturations dropping. He continues to take his meds, work on healthy eating habits and exercising at least 5 days a week.    Expected Outcomes STG  Continue with exercise regimen and using breathing techniques to enable walking further distance without dropping oxygen saturations. Continue eating healthy and exercising for weight, blood pressure and lipid control.             ITP Comments:  ITP Comments     Row Name 01/16/23 1417 01/22/23 0725 02/03/23 1618 02/18/23 0814 02/18/23 1356   ITP Comments Called pt to follow up from MD appt for clearance to start rehab.  Note show no findings and needing rehab.  Left message for patient. Pt has not yet started rehab. 30 Day review completed. Medical Director ITP review done, changes made as directed, and signed approval by Medical Director.   waiting to start after medical clearance Called and spoke with pt. He has been cleared by cardiology to begin pulmonary rehab. Pt will start rehab 02/18/23 at 7:15 am. First full day of exercise!  Patient was oriented to gym and equipment including functions, settings, policies, and procedures.  Patient's individual exercise prescription and treatment plan were reviewed.  All starting workloads were established based on the results of the 6 minute walk test done at initial orientation visit.  The plan for exercise progression was also introduced and progression will be customized based on patient's performance and goals. 30 Day review completed. Medical Director ITP review done, changes made as directed, and signed approval by Medical Director.   new to program    Row Name 03/19/23 1118 04/16/23 0929 05/14/23 0953 06/11/23 0837 07/02/23 1127   ITP Comments 30 Day review completed. Medical Director ITP review done, changes made as directed, and signed approval by Medical Director. 30 Day  review completed. Medical Director ITP review done, changes made as directed, and signed approval by Medical Director. 30 Day review completed. Medical Director ITP review done, changes made as directed, and signed approval by Medical Director. 30 Day review completed. Medical Director ITP review done, changes made as directed, and signed approval by Medical Director. 30 Day review completed. Medical Director ITP review done, changes made as directed, and signed approval by Medical Director.            Comments:

## 2023-07-03 ENCOUNTER — Encounter: Payer: Medicare HMO | Admitting: *Deleted

## 2023-07-03 DIAGNOSIS — J449 Chronic obstructive pulmonary disease, unspecified: Secondary | ICD-10-CM

## 2023-07-03 NOTE — Progress Notes (Signed)
Daily Session Note  Patient Details  Name: TEREZ MILLARD MRN: [id number] Date of Birth: August 29, 1940 Referring Provider:    Encounter Date: 07/03/2023  Check In:  Session Check In - 07/03/23 0754       Check-In   Supervising physician immediately available to respond to emergencies See telemetry face sheet for immediately available ER MD    Location [facility]    Staff Present Ronette Deter, BS, Exercise Physiologist;Joseph [place], RCP,RRT,BSRT;Maxon Navassa BS, , Exercise Physiologist;Arneta Mahmood Katrinka Blazing, RN, [place]    Virtual Visit No    Medication changes reported     No    Fall or balance concerns reported    No    Warm-up and Cool-down Performed on first and last piece of equipment    Resistance Training Performed Yes    VAD Patient? No    PAD/SET Patient? No      Pain Assessment   Currently in Pain? No/denies                Social History   Tobacco Use  Smoking Status Former   Current packs/day: 0.00   Average packs/day: 1.5 packs/day for 55.0 years (82.5 ttl pk-yrs)   Types: Cigarettes   Start date: 08/12/1956   Quit date: 08/13/2011   Years since quitting: 11.8  Smokeless Tobacco Never    Goals Met:  Independence with exercise equipment Exercise tolerated well No report of concerns or symptoms today Strength training completed today  Goals Unmet:  Not Applicable  Comments: Pt able to follow exercise prescription today without complaint.  Will continue to monitor for progression.    Dr. Bethann Punches is Medical Director for [facility].  Dr. Vida Rigger is Medical Director for [facility].

## 2023-07-07 DIAGNOSIS — J432 Centrilobular emphysema: Secondary | ICD-10-CM | POA: Diagnosis not present

## 2023-07-07 DIAGNOSIS — J449 Chronic obstructive pulmonary disease, unspecified: Secondary | ICD-10-CM | POA: Diagnosis not present

## 2023-07-08 ENCOUNTER — Encounter: Payer: Medicare HMO | Admitting: *Deleted

## 2023-07-08 DIAGNOSIS — J449 Chronic obstructive pulmonary disease, unspecified: Secondary | ICD-10-CM | POA: Diagnosis not present

## 2023-07-08 NOTE — Progress Notes (Signed)
Pulmonary Individual Treatment Plan  Patient Details  Name: Gregory Haynes MRN: [id number] Date of Birth: Jul 08, 1941 Referring Provider:    Initial Encounter Date:  Flowsheet Row Pulmonary Rehab from 12/05/2022 in [facility]  Date 12/05/22       Visit Diagnosis: Chronic obstructive pulmonary disease, unspecified COPD type (HCC)  Patient's Home Medications on Admission:  Current Outpatient Medications:    acidophilus (RISAQUAD) CAPS capsule, Take 1 capsule by mouth daily., Disp: , Rfl:    albuterol (VENTOLIN HFA) 108 (90 Base) MCG/ACT inhaler, Inhale 1-2 puffs into the lungs every 4 (four) hours as needed for wheezing or shortness of breath., Disp: 18 g, Rfl: 3   atorvastatin (LIPITOR) 40 MG tablet, Take 1 tablet (40 mg total) by mouth every evening., Disp: 90 tablet, Rfl: 3   carvedilol (COREG) 3.125 MG tablet, Take 1 tablet (3.125 mg total) by mouth 2 (two) times daily with a meal., Disp: 60 tablet, Rfl: 1   clobetasol cream (TEMOVATE) 0.05 %, Apply 1 Application topically 2 (two) times daily., Disp: , Rfl:    feeding supplement (ENSURE ENLIVE / ENSURE PLUS) LIQD, Take 237 mLs by mouth 3 (three) times daily. (Patient not taking: Reported on 12/03/2022), Disp: 237 mL, Rfl: 12   Fluticasone-Umeclidin-Vilant (TRELEGY ELLIPTA) 100-62.5-25 MCG/INH AEPB, Inhale 1 puff into the lungs daily., Disp: , Rfl:    folic acid (FOLVITE) 1 MG tablet, Take 1 mg by mouth daily. Unsure dose (Patient not taking: Reported on 12/03/2022), Disp: , Rfl:    ipratropium-albuterol (DUONEB) 0.5-2.5 (3) MG/3ML SOLN, Inhale into the lungs., Disp: , Rfl:    lisinopril (ZESTRIL) 2.5 MG tablet, Take 1 tablet (2.5 mg total) by mouth daily., Disp: 30 tablet, Rfl: 0   OXYGEN, Inhale 2 L/hr into the lungs. At night and as needed during day., Disp: , Rfl:   Past Medical History: Past Medical History:  Diagnosis Date   Anemia    Cancer (HCC)    BCC   Cataract    COPD (chronic obstructive pulmonary  disease) (HCC)    Helicobacter pylori ab+    Hyperlipidemia    Hypertension    Psoriasis    Wears hearing aid in both ears     Tobacco Use: Social History   Tobacco Use  Smoking Status Former   Current packs/day: 0.00   Average packs/day: 1.5 packs/day for 55.0 years (82.5 ttl pk-yrs)   Types: Cigarettes   Start date: 08/12/1956   Quit date: 08/13/2011   Years since quitting: 11.9  Smokeless Tobacco Never    Labs: Review Flowsheet  More data exists      Latest Ref Rng & Units 05/17/2020 05/01/2022 10/31/2022 11/01/2022 11/02/2022  Labs for ITP Cardiac and Pulmonary Rehab  Cholestrol 100 - 199 mg/dL 102  725  - - -  LDL (calc) 0 - 99 mg/dL 79  70  - - -  HDL-C >36 mg/dL 48  49  - - -  Trlycerides 0 - 149 mg/dL 76  84  - - -  Hemoglobin A1c 4.8 - 5.6 % - - 6.3  - -  PH, Arterial 7.35 - 7.45 - - 7.38  7.36  7.43  7.47   PCO2 arterial 32 - 48 mmHg - - 71  80  69  62   Bicarbonate 20.0 - 28.0 mmol/L - - 48.4  42.0  45.2  45.6  45.8  45.1   O2 Saturation % - - 67  98.3  95.3  94.7  98.9  99.2     Details       Multiple values from one day are sorted in reverse-chronological order          Pulmonary Assessment Scores:  Pulmonary Assessment Scores     Row Name 06/19/23 0802         ADL UCSD   SOB Score total 45     Rest 1     Walk 1     Stairs 3     Bath 3     Dress 2     Shop 2       CAT Score   CAT Score 11              UCSD: Self-administered rating of dyspnea associated with activities of daily living (ADLs) 6-point scale (0 = "not at all" to 5 = "maximal or unable to do because of breathlessness")  Scoring Scores range from 0 to 120.  Minimally important difference is 5 units  CAT: CAT can identify the health impairment of COPD patients and is better correlated with disease progression.  CAT has a scoring range of zero to 40. The CAT score is classified into four groups of low (less than 10), medium (10 - 20), high (21-30) and very high (31-40) based  on the impact level of disease on health status. A CAT score over 10 suggests significant symptoms.  A worsening CAT score could be explained by an exacerbation, poor medication adherence, poor inhaler technique, or progression of COPD or comorbid conditions.  CAT MCID is 2 points  mMRC: mMRC (Modified Medical Research Council) Dyspnea Scale is used to assess the degree of baseline functional disability in patients of respiratory disease due to dyspnea. No minimal important difference is established. A decrease in score of 1 point or greater is considered a positive change.   Pulmonary Function Assessment:   Exercise Target Goals: Exercise Program Goal: Individual exercise prescription set using results from initial 6 min walk test and THRR while considering  patient's activity barriers and safety.   Exercise Prescription Goal: Initial exercise prescription builds to 30-45 minutes a day of aerobic activity, 2-3 days per week.  Home exercise guidelines will be given to patient during program as part of exercise prescription that the participant will acknowledge.  Education: Aerobic Exercise: - Group verbal and visual presentation on the components of exercise prescription. Introduces F.I.T.T principle from ACSM for exercise prescriptions.  Reviews F.I.T.T. principles of aerobic exercise including progression. Written material given at graduation.   Education: Resistance Exercise: - Group verbal and visual presentation on the components of exercise prescription. Introduces F.I.T.T principle from ACSM for exercise prescriptions  Reviews F.I.T.T. principles of resistance exercise including progression. Written material given at graduation.    Education: Exercise & Equipment Safety: - Individual verbal instruction and demonstration of equipment use and safety with use of the equipment. Flowsheet Row Pulmonary Rehab from 12/05/2022 in [facility]  Education need identified  12/05/22  Date 12/05/22  Educator KW  Instruction Review Code 1- Verbalizes Understanding       Education: Exercise Physiology & General Exercise Guidelines: - Group verbal and written instruction with models to review the exercise physiology of the cardiovascular system and associated critical values. Provides general exercise guidelines with specific guidelines to those with heart or lung disease.    Education: Flexibility, Balance, Mind/Body Relaxation: - Group verbal and visual presentation with interactive activity on the components of exercise prescription. Introduces F.I.T.T  principle from ACSM for exercise prescriptions. Reviews F.I.T.T. principles of flexibility and balance exercise training including progression. Also discusses the mind body connection.  Reviews various relaxation techniques to help reduce and manage stress (i.e. Deep breathing, progressive muscle relaxation, and visualization). Balance handout provided to take home. Written material given at graduation.   Activity Barriers & Risk Stratification:   6 Minute Walk:  6 Minute Walk     Row Name 06/17/23 0754         6 Minute Walk   Phase Discharge     Distance 1120 feet     Distance % Change 25.8 %     Distance Feet Change 230 ft     Walk Time 6 minutes     # of Rest Breaks 0     MPH 2.12     METS 2.39     RPE 11     Perceived Dyspnea  1     VO2 Peak 8.35     Symptoms No     Resting HR 82 bpm     Resting BP 134/76     Resting Oxygen Saturation  98 %     Exercise Oxygen Saturation  during 6 min walk 90 %     Max Ex. HR 104 bpm     Max Ex. BP 144/78     2 Minute Post BP 126/70       Interval HR   1 Minute HR 95     2 Minute HR 94     3 Minute HR 99     4 Minute HR 99     5 Minute HR 103     6 Minute HR 104     2 Minute Post HR 93     Interval Heart Rate? Yes       Interval Oxygen   Interval Oxygen? Yes     Baseline Oxygen Saturation % 98 %     1 Minute Oxygen Saturation % 93 %     1  Minute Liters of Oxygen 2 L  pulsed     2 Minute Oxygen Saturation % 90 %     2 Minute Liters of Oxygen 2 L     3 Minute Oxygen Saturation % 90 %     3 Minute Liters of Oxygen 2 L     4 Minute Oxygen Saturation % 91 %     4 Minute Liters of Oxygen 2 L     5 Minute Oxygen Saturation % 92 %     5 Minute Liters of Oxygen 2 L     6 Minute Oxygen Saturation % 90 %     6 Minute Liters of Oxygen 2 L     2 Minute Post Oxygen Saturation % 97 %     2 Minute Post Liters of Oxygen 2 L             Oxygen Initial Assessment:   Oxygen Re-Evaluation:  Oxygen Re-Evaluation     Row Name 02/18/23 0815 02/25/23 0741 05/13/23 0844 05/29/23 0741 06/19/23 0821     Program Oxygen Prescription   Program Oxygen Prescription Continuous Continuous Continuous Continuous Continuous   Liters per minute 2 2 2 2 2      Home Oxygen   Home Oxygen Device Portable Concentrator;Home Concentrator Portable Concentrator;Home Concentrator Portable Concentrator;Home Concentrator Portable Concentrator;Home Concentrator;E-Tanks Portable Concentrator;Home Concentrator;E-Tanks   Sleep Oxygen Prescription Continuous;CPAP Continuous;CPAP Continuous;CPAP Continuous;CPAP Continuous;CPAP  wears CPAP during day for 5 hours.  does not tolerate at night   Liters per minute 2 2 2 2 2    Home Exercise Oxygen Prescription Continuous Continuous Continuous Continuous Continuous   Liters per minute 2 2 2 2 2    Home Resting Oxygen Prescription Continuous Continuous Continuous Continuous Continuous   Liters per minute 2 2 2 2 2    Compliance with Home Oxygen Use Yes Yes Yes Yes Yes     Goals/Expected Outcomes   Short Term Goals To learn and demonstrate proper pursed lip breathing techniques or other breathing techniques.  To learn and demonstrate proper pursed lip breathing techniques or other breathing techniques. ;To learn and exhibit compliance with exercise, home and travel O2 prescription;To learn and understand importance of  monitoring SPO2 with pulse oximeter and demonstrate accurate use of the pulse oximeter.;To learn and understand importance of maintaining oxygen saturations>88%;To learn and demonstrate proper use of respiratory medications To learn and demonstrate proper pursed lip breathing techniques or other breathing techniques. ;To learn and exhibit compliance with exercise, home and travel O2 prescription;To learn and understand importance of monitoring SPO2 with pulse oximeter and demonstrate accurate use of the pulse oximeter.;To learn and understand importance of maintaining oxygen saturations>88%;To learn and demonstrate proper use of respiratory medications To learn and understand importance of maintaining oxygen saturations>88%;To learn and understand importance of monitoring SPO2 with pulse oximeter and demonstrate accurate use of the pulse oximeter. To learn and understand importance of maintaining oxygen saturations>88%   Long  Term Goals Exhibits proper breathing techniques, such as pursed lip breathing or other method taught during program session Exhibits compliance with exercise, home  and travel O2 prescription;Verbalizes importance of monitoring SPO2 with pulse oximeter and return demonstration;Maintenance of O2 saturations>88%;Exhibits proper breathing techniques, such as pursed lip breathing or other method taught during program session;Compliance with respiratory medication Exhibits compliance with exercise, home  and travel O2 prescription;Verbalizes importance of monitoring SPO2 with pulse oximeter and return demonstration;Maintenance of O2 saturations>88%;Exhibits proper breathing techniques, such as pursed lip breathing or other method taught during program session;Compliance with respiratory medication Maintenance of O2 saturations>88%;Verbalizes importance of monitoring SPO2 with pulse oximeter and return demonstration Verbalizes importance of monitoring SPO2 with pulse oximeter and return  demonstration;Maintenance of O2 saturations>88%   Comments Reviewed PLB technique with pt.  Talked about how it works and it's importance in maintaining their exercise saturations. Patient reports compliance with home oxygen use and respiratory medications. He does have a pulse ox at home and consistently monitors SaO2 levels and reports that they ususally stay above 88%. He reports that if he gets SOB or notices decreases in SaO2 levels he feels comfortable using PLB. Reviewd PLB technique with patient today. Gregory Haynes states that he has continues to practice his PLB with exercise. He also has a pulse oximeter at home and his beeen checking his O2 saturations. He reports that his O2 occasionally drops below 90% during exercise but he states that it recovers quickly with rest. Gregory Haynes has a pulse oximeter to check his oxygen saturation at home. Informed and explained why it is important to have one. Reviewed that oxygen saturations should be 88 percent and above. He keeps multiple pulse oximeters around the house and his car to keep track of his oxygen. Gregory Haynes has a pulse oximeter to check his oxygen saturation at home. Informed and explained why it is important to have one. Reviewed that oxygen saturations should be 88 percent and above. He keeps multiple pulse oximeters around the house and his car to keep  track of his oxygen.  He watches his oxygen saturations when he exerts himself and stops if he sees the oximeter reading below 88%.   Goals/Expected Outcomes Short: Become more profiecient at using PLB. Long: Become independent at using PLB. Short: continue to attend pulmonary rehab consistently to work on strength and lung function. Long: continue to use PLB independently Short: Continue to monitor O2 saturations at home. Long: Continue to use PLB independently Short: monitor oxygen at home with exertion. Long: maintain oxygen saturations above 88 percent independently. Short: Continue to monitor oxygen at home with  exertion. Long: maintain oxygen saturations above 88 percent independently.            Oxygen Discharge (Final Oxygen Re-Evaluation):  Oxygen Re-Evaluation - 06/19/23 0821       Program Oxygen Prescription   Program Oxygen Prescription Continuous    Liters per minute 2      Home Oxygen   Home Oxygen Device Portable Concentrator;Home Concentrator;E-Tanks    Sleep Oxygen Prescription Continuous;CPAP   wears CPAP during day for 5 hours.  does not tolerate at night   Liters per minute 2    Home Exercise Oxygen Prescription Continuous    Liters per minute 2    Home Resting Oxygen Prescription Continuous    Liters per minute 2    Compliance with Home Oxygen Use Yes      Goals/Expected Outcomes   Short Term Goals To learn and understand importance of maintaining oxygen saturations>88%    Long  Term Goals Verbalizes importance of monitoring SPO2 with pulse oximeter and return demonstration;Maintenance of O2 saturations>88%    Comments Gregory Haynes has a pulse oximeter to check his oxygen saturation at home. Informed and explained why it is important to have one. Reviewed that oxygen saturations should be 88 percent and above. He keeps multiple pulse oximeters around the house and his car to keep track of his oxygen.  He watches his oxygen saturations when he exerts himself and stops if he sees the oximeter reading below 88%.    Goals/Expected Outcomes Short: Continue to monitor oxygen at home with exertion. Long: maintain oxygen saturations above 88 percent independently.             Initial Exercise Prescription:   Perform Capillary Blood Glucose checks as needed.  Exercise Prescription Changes:   Exercise Prescription Changes     Row Name 03/05/23 1000 03/19/23 1300 04/01/23 1400 04/03/23 0900 04/17/23 1100     Response to Exercise   Blood Pressure (Admit) 110/60 112/60 104/60 -- 102/60   Blood Pressure (Exercise) 142/66 140/70 142/60 -- 130/68   Blood Pressure (Exit) 106/62  110/62 108/58 -- 120/62   Heart Rate (Admit) 91 bpm 108 bpm 110 bpm -- 89 bpm   Heart Rate (Exercise) 122 bpm 124 bpm 127 bpm -- 111 bpm   Heart Rate (Exit) 114 bpm 116 bpm 123 bpm -- 106 bpm   Oxygen Saturation (Admit) 95 % 90 % 95 % -- 90 %   Oxygen Saturation (Exercise) 96 % 89 % 91 % -- 98 %   Oxygen Saturation (Exit) 94 % 95 % 96 % -- 98 %   Rating of Perceived Exertion (Exercise) 15 13 15  -- 11   Perceived Dyspnea (Exercise) 2 2 0 -- 1   Symptoms -- SOB SOB -- SOB   Duration -- -- Progress to 30 minutes of  aerobic without signs/symptoms of physical distress -- Progress to 30 minutes of  aerobic without signs/symptoms of  physical distress   Intensity -- -- THRR unchanged -- THRR unchanged     Progression   Progression -- -- Continue to progress workloads to maintain intensity without signs/symptoms of physical distress. -- Continue to progress workloads to maintain intensity without signs/symptoms of physical distress.   Average METs -- 1.8 2.01 -- 2.09     Resistance Training   Training Prescription Yes Yes Yes -- Yes   Weight 4 lb 4 lb 4 lb -- 4 lb   Reps 10-15 10-15 10-15 -- 10-15     Interval Training   Interval Training -- -- No -- No     Oxygen   Oxygen Continuous Continuous Continuous -- Continuous   Liters 2 2 2  -- 2     Recumbant Bike   Level 2 2 -- -- --   Watts 18 18 -- -- --   Minutes 15 15 -- -- --   METs 2.68 2.69 -- -- --     NuStep   Level 3 -- -- -- 2   Minutes 15 -- -- -- 15   METs 2.1 -- -- -- 2.09     T5 Nustep   Level -- -- -- -- 2   Minutes -- -- -- -- 15   METs -- -- -- -- 1.8     Biostep-RELP   Level 1 2 3  -- --   SPM -- 50 -- -- --   Minutes 15 15 15  -- --   METs 2 2 2  -- --     Track   Laps 19 25 19  -- 28   Minutes 15 15 15  -- 15   METs 2.03 2.36 2.03 -- 2.52     Home Exercise Plan   Plans to continue exercise at -- -- -- Home (comment)  Walking at home around [facility] or at the mall Home (comment)  Walking at home around  [facility] or at [employer] -- -- -- Add 3 additional days to program exercise sessions. Add 3 additional days to program exercise sessions.   Initial Home Exercises Provided -- -- -- 04/03/23 04/03/23     Oxygen   Maintain Oxygen Saturation 88% or higher 88% or higher 88% or higher 88% or higher 88% or higher    Row Name 05/01/23 1600 05/15/23 1700 05/29/23 1400 06/12/23 1300 06/26/23 0900     Response to Exercise   Blood Pressure (Admit) 122/72 100/60 128/60 122/66 122/70   Blood Pressure (Exit) 122/68 132/76 114/72 110/50 124/72   Heart Rate (Admit) 82 bpm 103 bpm 90 bpm 93 bpm 84 bpm   Heart Rate (Exercise) 113 bpm 120 bpm 111 bpm 108 bpm 116 bpm   Heart Rate (Exit) 104 bpm 108 bpm 96 bpm 101 bpm 90 bpm   Oxygen Saturation (Admit) 88 % 95 % 92 % 95 % 97 %   Oxygen Saturation (Exercise) 87 % 90 % 87 % 90 % 90 %   Oxygen Saturation (Exit) 96 % 97 % 92 % 98 % 98 %   Rating of Perceived Exertion (Exercise) 13 12 12 13 12    Perceived Dyspnea (Exercise) 1 1 0 2 0   Symptoms SOB SOB SOB SOB SOB   Duration Progress to 30 minutes of  aerobic without signs/symptoms of physical distress Progress to 30 minutes of  aerobic without signs/symptoms of physical distress Progress to 30 minutes of  aerobic without signs/symptoms of physical distress Progress to 30 minutes of  aerobic  without signs/symptoms of physical distress Progress to 30 minutes of  aerobic without signs/symptoms of physical distress   Intensity THRR unchanged THRR unchanged THRR unchanged THRR unchanged THRR unchanged     Progression   Progression Continue to progress workloads to maintain intensity without signs/symptoms of physical distress. Continue to progress workloads to maintain intensity without signs/symptoms of physical distress. Continue to progress workloads to maintain intensity without signs/symptoms of physical distress. Continue to progress workloads to maintain intensity without signs/symptoms of  physical distress. Continue to progress workloads to maintain intensity without signs/symptoms of physical distress.   Average METs 2.28 2.3 2.47 2.3 2.12     Resistance Training   Training Prescription Yes Yes Yes Yes Yes   Weight 4 lb 4 lb 4 lb 4 lb 4 lb   Reps 10-15 10-15 10-15 10-15 10-15     Interval Training   Interval Training No No No No No     Oxygen   Oxygen Continuous Continuous Continuous Continuous Continuous   Liters 2 2 2 2 2      Recumbant Bike   Level 2 2 2 2  --   Watts 15 15 18 16  --   Minutes 15 15 15 15  --   METs 2.58 2.58 2.7 2.62 --     NuStep   Level 3 3 3 2 2    Minutes 15 15 15 15 15    METs 2.3 2.2 3 1.9 2     T5 Nustep   Level -- -- -- -- 1   Minutes -- -- -- -- 15   METs -- -- -- -- 1.8     Biostep-RELP   Level -- -- -- -- 2   Minutes -- -- -- -- 15   METs -- -- -- -- 2     Track   Laps 24 28 25 25 26    Minutes 15 15 15 15 15    METs 2.31 2.52 2.36 2.36 2.41     Home Exercise Plan   Plans to continue exercise at Home (comment)  Walking at home around [facility] or at the mall Home (comment)  Walking at home around [employer] or at the mall Home (comment)  Walking at home around [employer] or at [employer] (comment)  Walking at home around [employer] or at [employer] (comment)  Walking at home around [employer] or at [employer] Add 3 additional days to program exercise sessions. Add 3 additional days to program exercise sessions. Add 3 additional days to program exercise sessions. Add 3 additional days to program exercise sessions. Add 3 additional days to program exercise sessions.   Initial Home Exercises Provided 04/03/23 04/03/23 04/03/23 04/03/23 04/03/23     Oxygen   Maintain Oxygen Saturation 88% or higher 88% or higher 88% or higher 88% or higher 88% or higher            Exercise Comments:   Exercise Comments     Row Name 02/18/23 9147 07/08/23 0820         Exercise Comments First  full day of exercise!  Patient was oriented to gym and equipment including functions, settings, policies, and procedures.  Patient's individual exercise prescription and treatment plan were reviewed.  All starting workloads were established based on the results of the 6 minute walk test done at initial orientation visit.  The plan for exercise progression was also introduced and progression will be customized based on patient's performance and  goals. Tremar graduated today from  rehab with 36 sessions completed.  Details of the patient's exercise prescription and what He needs to do in order to continue the prescription and progress were discussed with patient.  Patient was given a copy of prescription and goals.  Patient verbalized understanding. Gregory Haynes plans to continue to exercise by walking at home .               Exercise Goals and Review:   Exercise Goals Re-Evaluation :  Exercise Goals Re-Evaluation     Row Name 02/18/23 1478 02/25/23 0737 03/05/23 1011 03/19/23 1332 04/01/23 1450     Exercise Goal Re-Evaluation   Exercise Goals Review Able to understand and use rate of perceived exertion (RPE) scale;Able to understand and use Dyspnea scale;Understanding of Exercise Prescription Increase Physical Activity;Increase Strength and Stamina Increase Physical Activity;Understanding of Exercise Prescription;Increase Strength and Stamina Increase Physical Activity;Increase Strength and Stamina;Understanding of Exercise Prescription Increase Physical Activity;Increase Strength and Stamina;Understanding of Exercise Prescription   Comments Reviewed RPE  and dyspnea scale, THR and program prescription with pt today.  Pt voiced understanding and was given a copy of goals to take home. After Gregory Haynes's orientation he was out for an extended period of time due to health issues and hospitalization. He has now resently started the program and is attending consistently. He is tolerating exercise well and getting  established in his exercise routine. Gregory Haynes has started his exercise sessions. He has increased his Nustep T4 level to 3 and his handweight to 4 LBs. His RPE is 12 (one at 15 on the Rec Bike) . Will contiue to encourage him to increase workloads as tolerated and  tomonitor his exericse progression. Gregory Haynes is doing well in rehab. He increased his track laps to 25. He also continues to do well with the 4 lb weights. We will continue to monitor his progress in the program. Gregory Haynes is doing well in rehab. He has only attended one session since the last review. He continues to work at level 4 on [employer] and walk the track. We will continue to monitor his progress in the program upon return.   Expected Outcomes Short: Use RPE daily to regulate intensity. Long: Follow program prescription in THR. Short: attend pulmonary rehab consistently and continue to get estalished in exercise routine. Long: become independent with exercise routine. GNF:[id number] workloads as tolerated with goal of increased stamina and strength.   LTG: Continued exercise progression as tolerated during program and after discharge. Short: Continue to follow current exercise prescription, and progressively increase workloads. Long: Continue exercise to improve strength and stamina. Short: Return to the program once cleared and able. Long: Continue exercise to improve strength and stamina.    Row Name 04/03/23 0845 04/17/23 1129 05/01/23 1654 05/13/23 0829 05/15/23 1721     Exercise Goal Re-Evaluation   Exercise Goals Review Increase Physical Activity;Increase Strength and Stamina;Understanding of Exercise Prescription Increase Physical Activity;Increase Strength and Stamina;Understanding of Exercise Prescription Increase Physical Activity;Increase Strength and Stamina;Understanding of Exercise Prescription Increase Physical Activity;Increase Strength and Stamina;Understanding of Exercise Prescription Increase Physical Activity;Increase Strength and  Stamina;Understanding of Exercise Prescription   Comments Reviewed home exercise with pt today.  Pt plans to walk every morning around the mall or around [facility] for exercise. Reviewed THR, pulse, RPE, sign and symptoms, pulse oximetery and when to call 911 or MD.  Also discussed weather considerations and indoor options.  Pt voiced understanding. Gregory Haynes continues to do well in rehab. He recently increased his  track laps to 28 from 22. He also added the T5 nustep at level 2 to his prescription. We will continue to monitor his progress in the program. Gregory Haynes continues to do well in rehab. He recently increased his level on the T4 nustep from 2 to 3. He has maintained his intensity on the recumbent bike and track. We will continue to monitor his progress in the program. Gregory Haynes states that he has been doing well with exercise. He reports noticing improvement with his strength and stamina, as well as his shortness of breath during exercise. On his days away from rehab he has been walking for 45 minutes at the [facility] in [place]. He states that he stops walking when his O2 saturations drop below 90%, but they usually recover quickly within 30 seconds. We will continue to monitor his progress in the program. Gregory Haynes continues to do well in rehab. He has been able to maintain his recumbent bike intensity at level 2. He has also increased his track laps from 21 to 28. We will continue to monitor his progress in the program.   Expected Outcomes Short: Continue exercise outside of the program. Long: Continue exercise to improve strength and stamina. Short: Continue to follow current exercise prescription, and progressively increase workloads. Long: Continue exercise to improve strength and stamina. Short: Continue to follow current exercise prescription, and progressively increase workloads. Long: Continue exercise to improve strength and stamina. Short: Continue to walk at the mall on days away from rehab. Long:  Continue to exercise independently. Short: Continue to increase workload on the Recumbent bike. Long: continue to exercise to improve strength and stamina.    Row Name 05/29/23 1449 06/12/23 1348 06/19/23 0759 06/26/23 1011       Exercise Goal Re-Evaluation   Exercise Goals Review Increase Physical Activity;Increase Strength and Stamina;Understanding of Exercise Prescription Increase Physical Activity;Increase Strength and Stamina;Understanding of Exercise Prescription Increase Strength and Stamina;Increase Physical Activity Increase Strength and Stamina;Increase Physical Activity;Understanding of Exercise Prescription    Comments Gregory Haynes continues to work hard in the program. He has been able to increase his wattage on the bike from 15 watts to 18 on a level of 2. He has also been able to roughly maintain his number of track laps in 15 minutes. We will continue to monitor his progress in the program. Gregory Haynes continues to work hard in the program. He has been able to maintain his intensity on the Track at 25 laps in 15 minutes, and the recumbent bike at level 2. We will continue to monitor his progress in the program. Gregory Haynes is walking at the mall for 45 minutes when he is not here. He is actively looking for a place to continue his exercise routine after discharge. He is considering the [employer]   He continues to work on Hydrologist while in his exericse sessions Gregory Haynes is doing well and is close to graduating from the program. He recently completed his post and improved by 25.8%! He has stayed consistent with his workloads on seated machines as well as walking the track. We will continue to monitor his progress until he graduates from the program.    Expected Outcomes Short: Continue to increase workload on the Recumbent bike, and T4 nustep. Long: continue to exercise to improve strength and stamina. Short: Continue to increase workload on the Recumbent bike, and T4 nustep. Long: continue to  exercise to improve strength and stamina. STG Continue to walk at the mall and find a  place to exercise after discharge  LTG  Gregory Haynes is able to maintain his exewrcise after discharge Short: Graduate. Long: Continue to exercise independently.             Discharge Exercise Prescription (Final Exercise Prescription Changes):  Exercise Prescription Changes - 06/26/23 0900       Response to Exercise   Blood Pressure (Admit) 122/70    Blood Pressure (Exit) 124/72    Heart Rate (Admit) 84 bpm    Heart Rate (Exercise) 116 bpm    Heart Rate (Exit) 90 bpm    Oxygen Saturation (Admit) 97 %    Oxygen Saturation (Exercise) 90 %    Oxygen Saturation (Exit) 98 %    Rating of Perceived Exertion (Exercise) 12    Perceived Dyspnea (Exercise) 0    Symptoms SOB    Duration Progress to 30 minutes of  aerobic without signs/symptoms of physical distress    Intensity THRR unchanged      Progression   Progression Continue to progress workloads to maintain intensity without signs/symptoms of physical distress.    Average METs 2.12      Resistance Training   Training Prescription Yes    Weight 4 lb    Reps 10-15      Interval Training   Interval Training No      Oxygen   Oxygen Continuous    Liters 2      NuStep   Level 2    Minutes 15    METs 2      T5 Nustep   Level 1    Minutes 15    METs 1.8      Biostep-RELP   Level 2    Minutes 15    METs 2      Track   Laps 26    Minutes 15    METs 2.41      Home Exercise Plan   Plans to continue exercise at Home (comment)   Walking at home around [facility] or at the mall   Frequency Add 3 additional days to program exercise sessions.    Initial Home Exercises Provided 04/03/23      Oxygen   Maintain Oxygen Saturation 88% or higher             Nutrition:  Target Goals: Understanding of nutrition guidelines, daily intake of sodium 1500mg , cholesterol 200mg , calories 30% from fat and 7% or less from saturated fats, daily  to have 5 or more servings of fruits and vegetables.  Education: All About Nutrition: -Group instruction provided by verbal, written material, interactive activities, discussions, models, and posters to present general guidelines for heart healthy nutrition including fat, fiber, MyPlate, the role of sodium in heart healthy nutrition, utilization of the nutrition label, and utilization of this knowledge for meal planning. Follow up email sent as well. Written material given at graduation. Flowsheet Row Pulmonary Rehab from 12/05/2022 in [facility] Cardiac and Pulmonary Rehab  Education need identified 12/05/22       Biometrics:   Post Biometrics - 06/17/23 0803        Post  Biometrics   Height 5' 11.5" (1.816 m)    Weight 178 lb 14.4 oz (81.1 kg)    Waist Circumference 42 inches    Hip Circumference 40 inches    Waist to Hip Ratio 1.05 %    BMI (Calculated) 24.61    Single Leg Stand 7.4 seconds  Nutrition Therapy Plan and Nutrition Goals:   Nutrition Assessments:  MEDIFICTS Score Key: >=70 Need to make dietary changes  40-70 Heart Healthy Diet <= 40 Therapeutic Level Cholesterol Diet  Flowsheet Row Pulmonary Rehab from 06/19/2023 in [facility] Cardiac and Pulmonary Rehab  Picture Your Plate Total Score on Discharge 55      Picture Your Plate Scores: <16 Unhealthy dietary pattern with much room for improvement. 41-50 Dietary pattern unlikely to meet recommendations for good health and room for improvement. 51-60 More healthful dietary pattern, with some room for improvement.  >60 Healthy dietary pattern, although there may be some specific behaviors that could be improved.   Nutrition Goals Re-Evaluation:  Nutrition Goals Re-Evaluation     Row Name 02/25/23 0747 05/13/23 0835 05/29/23 0745 06/19/23 0748       Goals   Current Weight -- -- 179 lb (81.2 kg) --    Comment Patient decided to cancel RD apt and defer meeting for now. He plans to attend the nutrition  group education class in the future. Gregory Haynes has not met with the RD and states he feels he does not need to at this time. He states that his wife is very good at helping him with his nutrition as he states "she could write a book." He has cut out junk food and continues to work towards a healthy diet. Patient was informed on why it is important to maintain a balanced diet when dealing with Respiratory issues. Explained that it takes a lot of energy to breath and when they are short of breath often they will need to have a good diet to help keep up with the calories they are expending for breathing. Gregory Haynes continues to work on eating healthy. less sodium, more fresh fruits and vegs. has has 3 lbs weight loss since entering the program.  his wife is great to help him maintain the healthy nutrition plan.  He has a smoothie every morning.    Expected Outcome -- -- Short: Choose and plan snacks accordingly to patients caloric intake to improve breathing. Long: Maintain a diet independently that meets their caloric intake to aid in daily shortness of breath. STG continue the nutrition plan he is using daily  LTG maintains his eating habits.             Nutrition Goals Discharge (Final Nutrition Goals Re-Evaluation):  Nutrition Goals Re-Evaluation - 06/19/23 0748       Goals   Comment Gregory Haynes continues to work on eating healthy. less sodium, more fresh fruits and vegs. has has 3 lbs weight loss since entering the program.  his wife is great to help him maintain the healthy nutrition plan.  He has a smoothie every morning.    Expected Outcome STG continue the nutrition plan he is using daily  LTG maintains his eating habits.             Psychosocial: Target Goals: Acknowledge presence or absence of significant depression and/or stress, maximize coping skills, provide positive support system. Participant is able to verbalize types and ability to use techniques and skills needed for reducing stress and  depression.   Education: Stress, Anxiety, and Depression - Group verbal and visual presentation to define topics covered.  Reviews how body is impacted by stress, anxiety, and depression.  Also discusses healthy ways to reduce stress and to treat/manage anxiety and depression.  Written material given at graduation.   Education: Sleep Hygiene -Provides group verbal and written instruction about how sleep  can affect your health.  Define sleep hygiene, discuss sleep cycles and impact of sleep habits. Review good sleep hygiene tips.    Initial Review & Psychosocial Screening:   Quality of Life Scores:  Scores of 19 and below usually indicate a poorer quality of life in these areas.  A difference of  2-3 points is a clinically meaningful difference.  A difference of 2-3 points in the total score of the Quality of Life Index has been associated with significant improvement in overall quality of life, self-image, physical symptoms, and general health in studies assessing change in quality of life.  PHQ-9: Review Flowsheet  More data exists      06/19/2023 12/05/2022 07/01/2022 05/01/2022 07/01/2019  Depression screen PHQ 2/9  Decreased Interest 0 0 0 0 0  Down, Depressed, Hopeless 0 0 0 0 0  PHQ - 2 Score 0 0 0 0 0  Altered sleeping 0 0 0 0 -  Tired, decreased energy 0 1 0 0 -  Change in appetite 0 0 0 0 -  Feeling bad or failure about yourself  1 0 0 0 -  Trouble concentrating 0 0 0 0 -  Moving slowly or fidgety/restless 0 0 0 0 -  Suicidal thoughts 0 0 0 0 -  PHQ-9 Score 1 1 0 0 -  Difficult doing work/chores Not difficult at all Not difficult at all Not difficult at all Not difficult at all -    Details           Interpretation of Total Score  Total Score Depression Severity:  1-4 = Minimal depression, 5-9 = Mild depression, 10-14 = Moderate depression, 15-19 = Moderately severe depression, 20-27 = Severe depression   Psychosocial Evaluation and Intervention:   Psychosocial  Re-Evaluation:  Psychosocial Re-Evaluation     Row Name 02/25/23 [id number] 05/13/23 1324 05/29/23 0746 06/19/23 0745       Psychosocial Re-Evaluation   Current issues with None Identified None Identified None Identified None Identified    Comments Patient reports no new concerns or changes in sleep, stress, or mental health. He has recently started the program after a time gap between orientation and first day of class due to health concerns. He reports he is enjoying coming to class. Gregory Haynes reports no current stressors at this time. He also has been getting good sleep and using his CPAP. He states that his wife and family are a good support system for him. He also enjoys playing cards with friends and exercising for stress relief. Patient reports no issues with their current mental states, sleep, stress, depression or anxiety. Will follow up with patient in a few weeks for any changes. Gregory Haynes continues without any stress, depression or anxiety.     Expected Outcomes Short: continue to attend pulmonary rehab consistenly for mental health benefit. Long: maintain good mental health habits. Short: Continue to exercise for mental boost. Long: Maintain positive outlook. Short: Continue to exercise regularly to support mental health and notify staff of any changes. Long: maintain mental health and well being through teaching of rehab or prescribed medications independently. Short: Continue to exercise regularly to support mental health and notify staff of any changes. Long: maintain mental health and well being through teaching of rehab or prescribed medications independently.    Interventions Encouraged to attend Pulmonary Rehabilitation for the exercise Encouraged to attend Pulmonary Rehabilitation for the exercise Encouraged to attend Pulmonary Rehabilitation for the exercise Encouraged to attend Pulmonary Rehabilitation for the exercise  Continue Psychosocial Services  Follow up required by staff Follow up  required by staff Follow up required by staff Follow up required by staff             Psychosocial Discharge (Final Psychosocial Re-Evaluation):  Psychosocial Re-Evaluation - 06/19/23 0745       Psychosocial Re-Evaluation   Current issues with None Identified    Comments Gregory Haynes continues without any stress, depression or anxiety.     Expected Outcomes Short: Continue to exercise regularly to support mental health and notify staff of any changes. Long: maintain mental health and well being through teaching of rehab or prescribed medications independently.    Interventions Encouraged to attend Pulmonary Rehabilitation for the exercise    Continue Psychosocial Services  Follow up required by staff             Education: Education Goals: Education classes will be provided on a weekly basis, covering required topics. Participant will state understanding/return demonstration of topics presented.  Learning Barriers/Preferences:   General Pulmonary Education Topics:  Infection Prevention: - Provides verbal and written material to individual with discussion of infection control including proper hand washing and proper equipment cleaning during exercise session. Flowsheet Row Pulmonary Rehab from 12/05/2022 in [facility] Cardiac and Pulmonary Rehab  Education need identified 12/05/22  Date 12/05/22  Educator KW  Instruction Review Code 1- Verbalizes Understanding       Falls Prevention: - Provides verbal and written material to individual with discussion of falls prevention and safety. Flowsheet Row Pulmonary Rehab from 12/05/2022 in [facility] Cardiac and Pulmonary Rehab  Education need identified 12/05/22  Date 12/05/22  Educator KW  Instruction Review Code 1- Verbalizes Understanding       Chronic Lung Disease Review: - Group verbal instruction with posters, models, PowerPoint presentations and videos,  to review new updates, new respiratory medications, new advancements in  procedures and treatments. Providing information on websites and "800" numbers for continued self-education. Includes information about supplement oxygen, available portable oxygen systems, continuous and intermittent flow rates, oxygen safety, concentrators, and Medicare reimbursement for oxygen. Explanation of Pulmonary Drugs, including class, frequency, complications, importance of spacers, rinsing mouth after steroid MDI's, and proper cleaning methods for nebulizers. Review of basic lung anatomy and physiology related to function, structure, and complications of lung disease. Review of risk factors. Discussion about methods for diagnosing sleep apnea and types of masks and machines for OSA. Includes a review of the use of types of environmental controls: home humidity, furnaces, filters, dust mite/pet prevention, HEPA vacuums. Discussion about weather changes, air quality and the benefits of nasal washing. Instruction on Warning signs, infection symptoms, calling MD promptly, preventive modes, and value of vaccinations. Review of effective airway clearance, coughing and/or vibration techniques. Emphasizing that all should Create an Action Plan. Written material given at graduation. Flowsheet Row Pulmonary Rehab from 12/05/2022 in [facility] Cardiac and Pulmonary Rehab  Education need identified 12/05/22       AED/CPR: - Group verbal and written instruction with the use of models to demonstrate the basic use of the AED with the basic ABC's of resuscitation.    Anatomy and Cardiac Procedures: - Group verbal and visual presentation and models provide information about basic cardiac anatomy and function. Reviews the testing methods done to diagnose heart disease and the outcomes of the test results. Describes the treatment choices: Medical Management, Angioplasty, or Coronary Bypass Surgery for treating various heart conditions including Myocardial Infarction, Angina, Valve Disease, and Cardiac Arrhythmias.   Written material  given at graduation.   Medication Safety: - Group verbal and visual instruction to review commonly prescribed medications for heart and lung disease. Reviews the medication, class of the drug, and side effects. Includes the steps to properly store meds and maintain the prescription regimen.  Written material given at graduation.   Other: -Provides group and verbal instruction on various topics (see comments)   Knowledge Questionnaire Score:  Knowledge Questionnaire Score - 06/19/23 0807       Knowledge Questionnaire Score   Pre Score 16/18    Post Score 15/18              Core Components/Risk Factors/Patient Goals at Admission:   Education:Diabetes - Individual verbal and written instruction to review signs/symptoms of diabetes, desired ranges of glucose level fasting, after meals and with exercise. Acknowledge that pre and post exercise glucose checks will be done for 3 sessions at entry of program.   Know Your Numbers and Heart Failure: - Group verbal and visual instruction to discuss disease risk factors for cardiac and pulmonary disease and treatment options.  Reviews associated critical values for Overweight/Obesity, Hypertension, Cholesterol, and Diabetes.  Discusses basics of heart failure: signs/symptoms and treatments.  Introduces Heart Failure Zone chart for action plan for heart failure.  Written material given at graduation.   Core Components/Risk Factors/Patient Goals Review:   Goals and Risk Factor Review     Row Name 02/25/23 0757 05/13/23 0837 05/29/23 0744 06/19/23 0826       Core Components/Risk Factors/Patient Goals Review   Personal Goals Review Hypertension;Lipids Hypertension;Improve shortness of breath with ADL's Improve shortness of breath with ADL's Weight Management/Obesity;Hypertension;Lipids;Improve shortness of breath with ADL's    Review Patient reports that he consistently takes all BP and cholesterol meds and has a good  routine with his medications and is currently not having any problems with meds. He checks his BP every day at home 2 times a day. He just got a new BP machine. Gregory Haynes continues to check his BP and reports his reading have been within normal ranges. He has been taking all his medications as prescribed. He also reports that his shortness of breath has improved with ADLs as he states he previously could not walk to the mailbox without O2. Now he is able to not be on O2 while doing activities at home, although he states he does not leave the house without his O2. Spoke to patient about their shortness of breath and what they can do to improve. Patient has been informed of breathing techniques when starting the program. Patient is informed to tell staff if they have had any med changes and that certain meds they are taking or not taking can be causing shortness of breath. Gregory Haynes has lost about 3 lbs since statring the program. He is working on eating less sodium, more fruit and vegs. He has a smoothie every morning that is full of fruit. He ststes that he can walk to end of driveway now without his oxygen saturations dropping. He continues to take his meds, work on healthy eating habits and exercising at least 5 days a week.    Expected Outcomes Short: continue to monitor BP at home and take all meds as prescribed. Long: continue to control risk factors with meds and healthy lifestyle. Short: Continue to monitor BP at home and take all meds as prescribed. Long: Continue to monitor lifestyle risk factors. Short: Attend [facility] regularly to improve shortness of breath with ADL's. Long: maintain independence with  ADL's STG  Continue with exercise regimen and using breathing techniques to enable walking further distance without dropping oxygen saturations. Continue eating healthy and exercising for weight, blood pressure and lipid control.             Core Components/Risk Factors/Patient Goals at Discharge (Final  Review):   Goals and Risk Factor Review - 06/19/23 0826       Core Components/Risk Factors/Patient Goals Review   Personal Goals Review Weight Management/Obesity;Hypertension;Lipids;Improve shortness of breath with ADL's    Review Gregory Haynes has lost about 3 lbs since statring the program. He is working on eating less sodium, more fruit and vegs. He has a smoothie every morning that is full of fruit. He ststes that he can walk to end of driveway now without his oxygen saturations dropping. He continues to take his meds, work on healthy eating habits and exercising at least 5 days a week.    Expected Outcomes STG  Continue with exercise regimen and using breathing techniques to enable walking further distance without dropping oxygen saturations. Continue eating healthy and exercising for weight, blood pressure and lipid control.             ITP Comments:  ITP Comments     Row Name 01/16/23 1417 01/22/23 0725 02/03/23 1618 02/18/23 0814 02/18/23 1356   ITP Comments Called pt to follow up from MD appt for clearance to start rehab.  Note show no findings and needing rehab.  Left message for patient. Pt has not yet started rehab. 30 Day review completed. Medical Director ITP review done, changes made as directed, and signed approval by Medical Director.   waiting to start after medical clearance Called and spoke with pt. He has been cleared by cardiology to begin pulmonary rehab. Pt will start rehab 02/18/23 at 7:15 am. First full day of exercise!  Patient was oriented to gym and equipment including functions, settings, policies, and procedures.  Patient's individual exercise prescription and treatment plan were reviewed.  All starting workloads were established based on the results of the 6 minute walk test done at initial orientation visit.  The plan for exercise progression was also introduced and progression will be customized based on patient's performance and goals. 30 Day review completed. Medical  Director ITP review done, changes made as directed, and signed approval by Medical Director.   new to program    Row Name 03/19/23 1118 04/16/23 0929 05/14/23 0953 06/11/23 0837 07/02/23 1127   ITP Comments 30 Day review completed. Medical Director ITP review done, changes made as directed, and signed approval by Medical Director. 30 Day review completed. Medical Director ITP review done, changes made as directed, and signed approval by Medical Director. 30 Day review completed. Medical Director ITP review done, changes made as directed, and signed approval by Medical Director. 30 Day review completed. Medical Director ITP review done, changes made as directed, and signed approval by Medical Director. 30 Day review completed. Medical Director ITP review done, changes made as directed, and signed approval by Medical Director.    Row Name 07/08/23 0820           ITP Comments Kinard graduated today from  rehab with 36 sessions completed.  Details of the patient's exercise prescription and what He needs to do in order to continue the prescription and progress were discussed with patient.  Patient was given a copy of prescription and goals.  Patient verbalized understanding. Baylin plans to continue to exercise by walking at home .  Comments: Discharge ITP

## 2023-07-08 NOTE — Progress Notes (Signed)
Discharge Note for  Gregory Haynes     [birth year]        Gregory Haynes graduated today from  rehab with 36 sessions completed.  Details of the patient's exercise prescription and what He needs to do in order to continue the prescription and progress were discussed with patient.  Patient was given a copy of prescription and goals.  Patient verbalized understanding. Gregory Haynes plans to continue to exercise by walking at home .   6 Minute Walk     Row Name 06/17/23 0754         6 Minute Walk   Phase Discharge     Distance 1120 feet     Distance % Change 25.8 %     Distance Feet Change 230 ft     Walk Time 6 minutes     # of Rest Breaks 0     MPH 2.12     METS 2.39     RPE 11     Perceived Dyspnea  1     VO2 Peak 8.35     Symptoms No     Resting HR 82 bpm     Resting BP 134/76     Resting Oxygen Saturation  98 %     Exercise Oxygen Saturation  during 6 min walk 90 %     Max Ex. HR 104 bpm     Max Ex. BP 144/78     2 Minute Post BP 126/70       Interval HR   1 Minute HR 95     2 Minute HR 94     3 Minute HR 99     4 Minute HR 99     5 Minute HR 103     6 Minute HR 104     2 Minute Post HR 93     Interval Heart Rate? Yes       Interval Oxygen   Interval Oxygen? Yes     Baseline Oxygen Saturation % 98 %     1 Minute Oxygen Saturation % 93 %     1 Minute Liters of Oxygen 2 L  pulsed     2 Minute Oxygen Saturation % 90 %     2 Minute Liters of Oxygen 2 L     3 Minute Oxygen Saturation % 90 %     3 Minute Liters of Oxygen 2 L     4 Minute Oxygen Saturation % 91 %     4 Minute Liters of Oxygen 2 L     5 Minute Oxygen Saturation % 92 %     5 Minute Liters of Oxygen 2 L     6 Minute Oxygen Saturation % 90 %     6 Minute Liters of Oxygen 2 L     2 Minute Post Oxygen Saturation % 97 %     2 Minute Post Liters of Oxygen 2 L

## 2023-07-08 NOTE — Progress Notes (Signed)
Daily Session Note  Patient Details  Name: Gregory Haynes MRN: [id number] Date of Birth: 1940-09-18 Referring Provider:    Encounter Date: 07/08/2023  Check In:  Session Check In - 07/08/23 0813       Check-In   Supervising physician immediately available to respond to emergencies See telemetry face sheet for immediately available ER MD    Location [facility]    Staff Present Cora Collum, RN, BSN, CCRP;Noah Tickle, BS, Exercise Physiologist;Margaret Best, MS, Exercise Physiologist;Maxon Conetta BS, , Exercise Physiologist    Virtual Visit No    Medication changes reported     No    Fall or balance concerns reported    No    Warm-up and Cool-down Performed on first and last piece of equipment    Resistance Training Performed Yes    VAD Patient? No    PAD/SET Patient? No      Pain Assessment   Currently in Pain? No/denies                Social History   Tobacco Use  Smoking Status Former   Current packs/day: 0.00   Average packs/day: 1.5 packs/day for 55.0 years (82.5 ttl pk-yrs)   Types: Cigarettes   Start date: 08/12/1956   Quit date: 08/13/2011   Years since quitting: 11.9  Smokeless Tobacco Never    Goals Met:  Proper associated with RPD/PD & O2 Sat Independence with exercise equipment Exercise tolerated well No report of concerns or symptoms today  Goals Unmet:  Not Applicable  Comments:  Gregory Haynes graduated today from  rehab with 36 sessions completed.  Details of the patient's exercise prescription and what He needs to do in order to continue the prescription and progress were discussed with patient.  Patient was given a copy of prescription and goals.  Patient verbalized understanding. Gregory Haynes plans to continue to exercise by walking at home .   Dr. Bethann Punches is Medical Director for [facility].  Dr. Vida Rigger is Medical Director for [facility].

## 2023-07-14 DIAGNOSIS — J9621 Acute and chronic respiratory failure with hypoxia: Secondary | ICD-10-CM | POA: Diagnosis not present

## 2023-07-14 DIAGNOSIS — J449 Chronic obstructive pulmonary disease, unspecified: Secondary | ICD-10-CM | POA: Diagnosis not present

## 2023-07-15 DIAGNOSIS — J410 Simple chronic bronchitis: Secondary | ICD-10-CM | POA: Diagnosis not present

## 2023-07-15 DIAGNOSIS — I25118 Atherosclerotic heart disease of native coronary artery with other forms of angina pectoris: Secondary | ICD-10-CM | POA: Diagnosis not present

## 2023-07-15 DIAGNOSIS — J441 Chronic obstructive pulmonary disease with (acute) exacerbation: Secondary | ICD-10-CM | POA: Diagnosis not present

## 2023-07-15 DIAGNOSIS — I10 Essential (primary) hypertension: Secondary | ICD-10-CM | POA: Diagnosis not present

## 2023-07-15 DIAGNOSIS — J9621 Acute and chronic respiratory failure with hypoxia: Secondary | ICD-10-CM | POA: Diagnosis not present

## 2023-07-15 DIAGNOSIS — E7849 Other hyperlipidemia: Secondary | ICD-10-CM | POA: Diagnosis not present

## 2023-07-16 DIAGNOSIS — J449 Chronic obstructive pulmonary disease, unspecified: Secondary | ICD-10-CM | POA: Diagnosis not present

## 2023-07-22 DIAGNOSIS — J449 Chronic obstructive pulmonary disease, unspecified: Secondary | ICD-10-CM | POA: Diagnosis not present

## 2023-07-29 DIAGNOSIS — D485 Neoplasm of uncertain behavior of skin: Secondary | ICD-10-CM | POA: Diagnosis not present

## 2023-07-29 DIAGNOSIS — Z85820 Personal history of malignant melanoma of skin: Secondary | ICD-10-CM | POA: Diagnosis not present

## 2023-07-29 DIAGNOSIS — Z85828 Personal history of other malignant neoplasm of skin: Secondary | ICD-10-CM | POA: Diagnosis not present

## 2023-07-29 DIAGNOSIS — C44619 Basal cell carcinoma of skin of left upper limb, including shoulder: Secondary | ICD-10-CM | POA: Diagnosis not present

## 2023-07-29 DIAGNOSIS — L400 Psoriasis vulgaris: Secondary | ICD-10-CM | POA: Diagnosis not present

## 2023-07-29 DIAGNOSIS — D2262 Melanocytic nevi of left upper limb, including shoulder: Secondary | ICD-10-CM | POA: Diagnosis not present

## 2023-07-29 DIAGNOSIS — L409 Psoriasis, unspecified: Secondary | ICD-10-CM | POA: Diagnosis not present

## 2023-07-29 DIAGNOSIS — D2261 Melanocytic nevi of right upper limb, including shoulder: Secondary | ICD-10-CM | POA: Diagnosis not present

## 2023-07-29 DIAGNOSIS — D2271 Melanocytic nevi of right lower limb, including hip: Secondary | ICD-10-CM | POA: Diagnosis not present

## 2023-07-29 DIAGNOSIS — D2272 Melanocytic nevi of left lower limb, including hip: Secondary | ICD-10-CM | POA: Diagnosis not present

## 2023-07-29 DIAGNOSIS — L570 Actinic keratosis: Secondary | ICD-10-CM | POA: Diagnosis not present

## 2023-08-06 DIAGNOSIS — J449 Chronic obstructive pulmonary disease, unspecified: Secondary | ICD-10-CM | POA: Diagnosis not present

## 2023-08-06 DIAGNOSIS — J432 Centrilobular emphysema: Secondary | ICD-10-CM | POA: Diagnosis not present
